# Patient Record
Sex: Female | Born: 1965
Health system: Southern US, Community
[De-identification: ages and names within clinical notes are randomized; demographics above are authoritative.]

## PROBLEM LIST (undated history)

## (undated) DIAGNOSIS — R109 Unspecified abdominal pain: Secondary | ICD-10-CM

## (undated) DIAGNOSIS — J302 Other seasonal allergic rhinitis: Secondary | ICD-10-CM

## (undated) DIAGNOSIS — G10 Huntington's disease: Secondary | ICD-10-CM

## (undated) DIAGNOSIS — E049 Nontoxic goiter, unspecified: Secondary | ICD-10-CM

## (undated) DIAGNOSIS — Z87898 Personal history of other specified conditions: Secondary | ICD-10-CM

## (undated) DIAGNOSIS — R5383 Other fatigue: Secondary | ICD-10-CM

## (undated) DIAGNOSIS — F32A Depression, unspecified: Secondary | ICD-10-CM

## (undated) DIAGNOSIS — O99345 Other mental disorders complicating the puerperium: Secondary | ICD-10-CM

## (undated) DIAGNOSIS — K219 Gastro-esophageal reflux disease without esophagitis: Secondary | ICD-10-CM

## (undated) DIAGNOSIS — R5381 Other malaise: Secondary | ICD-10-CM

## (undated) DIAGNOSIS — R300 Dysuria: Secondary | ICD-10-CM

## (undated) DIAGNOSIS — M5090 Cervical disc disorder, unspecified, unspecified cervical region: Secondary | ICD-10-CM

## (undated) DIAGNOSIS — E559 Vitamin D deficiency, unspecified: Secondary | ICD-10-CM

## (undated) DIAGNOSIS — K589 Irritable bowel syndrome without diarrhea: Secondary | ICD-10-CM

## (undated) DIAGNOSIS — L0390 Cellulitis, unspecified: Secondary | ICD-10-CM

## (undated) DIAGNOSIS — M797 Fibromyalgia: Secondary | ICD-10-CM

## (undated) DIAGNOSIS — F53 Postpartum depression: Secondary | ICD-10-CM

## (undated) DIAGNOSIS — K254 Chronic or unspecified gastric ulcer with hemorrhage: Secondary | ICD-10-CM

## (undated) DIAGNOSIS — R002 Palpitations: Secondary | ICD-10-CM

## (undated) DIAGNOSIS — R42 Dizziness and giddiness: Secondary | ICD-10-CM

## (undated) DIAGNOSIS — L509 Urticaria, unspecified: Secondary | ICD-10-CM

## (undated) DIAGNOSIS — F329 Major depressive disorder, single episode, unspecified: Secondary | ICD-10-CM

## (undated) HISTORY — DX: Chronic or unspecified gastric ulcer with hemorrhage: K25.4

## (undated) HISTORY — DX: Dizziness and giddiness: R42

## (undated) HISTORY — DX: Major depressive disorder, single episode, unspecified: F32.9

## (undated) HISTORY — DX: Dysuria: R30.0

## (undated) HISTORY — DX: Palpitations: R00.2

## (undated) HISTORY — DX: Depression, unspecified: F32.A

## (undated) HISTORY — DX: Urticaria, unspecified: L50.9

## (undated) HISTORY — PX: CARDIAC CATHETERIZATION: SHX172

## (undated) HISTORY — DX: Other malaise: R53.81

## (undated) HISTORY — DX: Other mental disorders complicating the puerperium: O99.345

## (undated) HISTORY — DX: Postpartum depression: F53.0

## (undated) HISTORY — DX: Unspecified abdominal pain: R10.9

## (undated) HISTORY — DX: Personal history of other specified conditions: Z87.898

## (undated) HISTORY — DX: Huntington's disease: G10

## (undated) HISTORY — PX: CHOLECYSTECTOMY: SHX55

## (undated) HISTORY — PX: WISDOM TOOTH EXTRACTION: SHX21

## (undated) HISTORY — DX: Fibromyalgia: M79.7

## (undated) HISTORY — PX: CARTILAGE SURGERY: SHX1303

## (undated) HISTORY — DX: Nontoxic goiter, unspecified: E04.9

## (undated) HISTORY — DX: Other fatigue: R53.83

---

## 1999-02-19 ENCOUNTER — Encounter: Admission: RE | Admit: 1999-02-19 | Discharge: 1999-02-19 | Payer: Self-pay | Admitting: Internal Medicine

## 1999-11-25 ENCOUNTER — Encounter: Admission: RE | Admit: 1999-11-25 | Discharge: 1999-11-25 | Payer: Self-pay | Admitting: Emergency Medicine

## 1999-11-25 ENCOUNTER — Encounter: Payer: Self-pay | Admitting: Emergency Medicine

## 2000-04-15 ENCOUNTER — Emergency Department (HOSPITAL_COMMUNITY): Admission: EM | Admit: 2000-04-15 | Discharge: 2000-04-16 | Payer: Self-pay | Admitting: Emergency Medicine

## 2000-04-16 ENCOUNTER — Encounter: Payer: Self-pay | Admitting: Emergency Medicine

## 2000-09-26 ENCOUNTER — Encounter: Payer: Self-pay | Admitting: Emergency Medicine

## 2000-09-26 ENCOUNTER — Observation Stay (HOSPITAL_COMMUNITY): Admission: EM | Admit: 2000-09-26 | Discharge: 2000-09-28 | Payer: Self-pay | Admitting: Emergency Medicine

## 2000-09-27 ENCOUNTER — Encounter: Payer: Self-pay | Admitting: Cardiovascular Disease

## 2001-03-22 ENCOUNTER — Emergency Department (HOSPITAL_COMMUNITY): Admission: EM | Admit: 2001-03-22 | Discharge: 2001-03-22 | Payer: Self-pay | Admitting: *Deleted

## 2001-06-04 ENCOUNTER — Emergency Department (HOSPITAL_COMMUNITY): Admission: EM | Admit: 2001-06-04 | Discharge: 2001-06-04 | Payer: Self-pay | Admitting: Emergency Medicine

## 2001-06-12 ENCOUNTER — Emergency Department (HOSPITAL_COMMUNITY): Admission: EM | Admit: 2001-06-12 | Discharge: 2001-06-12 | Payer: Self-pay | Admitting: *Deleted

## 2001-06-24 ENCOUNTER — Emergency Department (HOSPITAL_COMMUNITY): Admission: EM | Admit: 2001-06-24 | Discharge: 2001-06-24 | Payer: Self-pay | Admitting: Emergency Medicine

## 2002-01-12 ENCOUNTER — Emergency Department (HOSPITAL_COMMUNITY): Admission: EM | Admit: 2002-01-12 | Discharge: 2002-01-12 | Payer: Self-pay | Admitting: Emergency Medicine

## 2002-04-02 ENCOUNTER — Emergency Department (HOSPITAL_COMMUNITY): Admission: EM | Admit: 2002-04-02 | Discharge: 2002-04-02 | Payer: Self-pay | Admitting: *Deleted

## 2002-09-04 ENCOUNTER — Emergency Department (HOSPITAL_COMMUNITY): Admission: AD | Admit: 2002-09-04 | Discharge: 2002-09-04 | Payer: Self-pay | Admitting: Emergency Medicine

## 2002-09-04 ENCOUNTER — Encounter: Payer: Self-pay | Admitting: Emergency Medicine

## 2002-09-06 ENCOUNTER — Emergency Department (HOSPITAL_COMMUNITY): Admission: EM | Admit: 2002-09-06 | Discharge: 2002-09-06 | Payer: Self-pay

## 2003-10-19 ENCOUNTER — Emergency Department (HOSPITAL_COMMUNITY): Admission: EM | Admit: 2003-10-19 | Discharge: 2003-10-19 | Payer: Self-pay | Admitting: Emergency Medicine

## 2003-10-19 ENCOUNTER — Emergency Department (HOSPITAL_COMMUNITY): Admission: EM | Admit: 2003-10-19 | Discharge: 2003-10-19 | Payer: Self-pay | Admitting: *Deleted

## 2004-05-25 ENCOUNTER — Emergency Department (HOSPITAL_COMMUNITY): Admission: EM | Admit: 2004-05-25 | Discharge: 2004-05-25 | Payer: Self-pay | Admitting: Family Medicine

## 2004-12-12 ENCOUNTER — Emergency Department (HOSPITAL_COMMUNITY): Admission: EM | Admit: 2004-12-12 | Discharge: 2004-12-12 | Payer: Self-pay | Admitting: Emergency Medicine

## 2005-08-20 ENCOUNTER — Emergency Department (HOSPITAL_COMMUNITY): Admission: EM | Admit: 2005-08-20 | Discharge: 2005-08-20 | Payer: Self-pay | Admitting: Emergency Medicine

## 2005-10-06 ENCOUNTER — Inpatient Hospital Stay (HOSPITAL_COMMUNITY): Admission: EM | Admit: 2005-10-06 | Discharge: 2005-10-07 | Payer: Self-pay | Admitting: Emergency Medicine

## 2005-10-11 ENCOUNTER — Encounter: Payer: Self-pay | Admitting: Internal Medicine

## 2005-10-11 ENCOUNTER — Ambulatory Visit: Payer: Self-pay

## 2005-10-13 ENCOUNTER — Emergency Department (HOSPITAL_COMMUNITY): Admission: EM | Admit: 2005-10-13 | Discharge: 2005-10-13 | Payer: Self-pay | Admitting: Emergency Medicine

## 2005-10-19 ENCOUNTER — Ambulatory Visit: Payer: Self-pay | Admitting: Internal Medicine

## 2005-10-25 ENCOUNTER — Ambulatory Visit (HOSPITAL_COMMUNITY): Admission: RE | Admit: 2005-10-25 | Discharge: 2005-10-25 | Payer: Self-pay | Admitting: Internal Medicine

## 2005-10-28 ENCOUNTER — Ambulatory Visit (HOSPITAL_COMMUNITY): Admission: RE | Admit: 2005-10-28 | Discharge: 2005-10-28 | Payer: Self-pay | Admitting: Cardiovascular Disease

## 2005-11-17 ENCOUNTER — Emergency Department (HOSPITAL_COMMUNITY): Admission: EM | Admit: 2005-11-17 | Discharge: 2005-11-17 | Payer: Self-pay | Admitting: Emergency Medicine

## 2005-11-17 ENCOUNTER — Ambulatory Visit: Payer: Self-pay | Admitting: Cardiology

## 2005-11-19 ENCOUNTER — Ambulatory Visit: Payer: Self-pay | Admitting: Cardiology

## 2005-12-07 ENCOUNTER — Ambulatory Visit: Payer: Self-pay | Admitting: Internal Medicine

## 2006-04-01 ENCOUNTER — Ambulatory Visit (HOSPITAL_COMMUNITY): Admission: RE | Admit: 2006-04-01 | Discharge: 2006-04-01 | Payer: Self-pay | Admitting: Family Medicine

## 2006-10-06 ENCOUNTER — Inpatient Hospital Stay (HOSPITAL_COMMUNITY): Admission: AD | Admit: 2006-10-06 | Discharge: 2006-10-06 | Payer: Self-pay | Admitting: Obstetrics & Gynecology

## 2006-11-17 ENCOUNTER — Inpatient Hospital Stay (HOSPITAL_COMMUNITY): Admission: RE | Admit: 2006-11-17 | Discharge: 2006-11-20 | Payer: Self-pay | Admitting: Obstetrics and Gynecology

## 2006-11-17 ENCOUNTER — Encounter (INDEPENDENT_AMBULATORY_CARE_PROVIDER_SITE_OTHER): Payer: Self-pay | Admitting: Obstetrics and Gynecology

## 2006-12-11 ENCOUNTER — Emergency Department (HOSPITAL_COMMUNITY): Admission: EM | Admit: 2006-12-11 | Discharge: 2006-12-11 | Payer: Self-pay | Admitting: Emergency Medicine

## 2007-02-18 ENCOUNTER — Emergency Department (HOSPITAL_COMMUNITY): Admission: EM | Admit: 2007-02-18 | Discharge: 2007-02-18 | Payer: Self-pay | Admitting: Emergency Medicine

## 2007-03-11 ENCOUNTER — Emergency Department (HOSPITAL_COMMUNITY): Admission: EM | Admit: 2007-03-11 | Discharge: 2007-03-11 | Payer: Self-pay | Admitting: Emergency Medicine

## 2007-05-03 ENCOUNTER — Emergency Department (HOSPITAL_COMMUNITY): Admission: EM | Admit: 2007-05-03 | Discharge: 2007-05-03 | Payer: Self-pay | Admitting: Emergency Medicine

## 2007-06-02 ENCOUNTER — Emergency Department (HOSPITAL_COMMUNITY): Admission: EM | Admit: 2007-06-02 | Discharge: 2007-06-02 | Payer: Self-pay | Admitting: Emergency Medicine

## 2007-06-02 ENCOUNTER — Ambulatory Visit: Payer: Self-pay | Admitting: Cardiovascular Disease

## 2007-06-05 ENCOUNTER — Encounter: Payer: Self-pay | Admitting: Internal Medicine

## 2007-06-05 ENCOUNTER — Ambulatory Visit: Payer: Self-pay

## 2007-06-09 ENCOUNTER — Ambulatory Visit: Payer: Self-pay | Admitting: Internal Medicine

## 2007-06-26 LAB — CONVERTED CEMR LAB: Pap Smear: NORMAL

## 2007-07-20 ENCOUNTER — Emergency Department (HOSPITAL_COMMUNITY): Admission: EM | Admit: 2007-07-20 | Discharge: 2007-07-20 | Payer: Self-pay | Admitting: Family Medicine

## 2007-07-25 ENCOUNTER — Ambulatory Visit: Payer: Self-pay | Admitting: Internal Medicine

## 2007-07-25 DIAGNOSIS — E049 Nontoxic goiter, unspecified: Secondary | ICD-10-CM | POA: Insufficient documentation

## 2007-07-25 DIAGNOSIS — J329 Chronic sinusitis, unspecified: Secondary | ICD-10-CM | POA: Insufficient documentation

## 2007-07-25 DIAGNOSIS — R002 Palpitations: Secondary | ICD-10-CM | POA: Insufficient documentation

## 2007-07-25 DIAGNOSIS — Z87898 Personal history of other specified conditions: Secondary | ICD-10-CM | POA: Insufficient documentation

## 2007-07-25 DIAGNOSIS — K219 Gastro-esophageal reflux disease without esophagitis: Secondary | ICD-10-CM | POA: Insufficient documentation

## 2007-07-31 ENCOUNTER — Encounter: Admission: RE | Admit: 2007-07-31 | Discharge: 2007-07-31 | Payer: Self-pay | Admitting: Internal Medicine

## 2007-08-01 ENCOUNTER — Telehealth (INDEPENDENT_AMBULATORY_CARE_PROVIDER_SITE_OTHER): Payer: Self-pay | Admitting: *Deleted

## 2007-08-03 ENCOUNTER — Ambulatory Visit: Payer: Self-pay | Admitting: Internal Medicine

## 2007-08-03 DIAGNOSIS — L509 Urticaria, unspecified: Secondary | ICD-10-CM | POA: Insufficient documentation

## 2007-08-17 ENCOUNTER — Ambulatory Visit: Payer: Self-pay | Admitting: Internal Medicine

## 2007-08-17 DIAGNOSIS — J309 Allergic rhinitis, unspecified: Secondary | ICD-10-CM | POA: Insufficient documentation

## 2007-10-19 ENCOUNTER — Ambulatory Visit: Payer: Self-pay | Admitting: Internal Medicine

## 2007-10-19 ENCOUNTER — Telehealth: Payer: Self-pay | Admitting: Internal Medicine

## 2007-10-19 DIAGNOSIS — R300 Dysuria: Secondary | ICD-10-CM | POA: Insufficient documentation

## 2007-10-19 DIAGNOSIS — R5383 Other fatigue: Secondary | ICD-10-CM | POA: Insufficient documentation

## 2007-10-19 LAB — CONVERTED CEMR LAB
Bilirubin Urine: NEGATIVE
Crystals: NEGATIVE
Ketones, ur: NEGATIVE mg/dL
Mucus, UA: NEGATIVE
Nitrite: NEGATIVE
Specific Gravity, Urine: <=1.005 (ref 1.000–1.03)
Total Protein, Urine: NEGATIVE mg/dL
Urine Glucose: NEGATIVE mg/dL
Urobilinogen, UA: 0.2 (ref 0.0–1.0)
pH: 5.5 (ref 5.0–8.0)

## 2007-10-20 ENCOUNTER — Telehealth: Payer: Self-pay | Admitting: Internal Medicine

## 2007-10-26 ENCOUNTER — Encounter: Admission: RE | Admit: 2007-10-26 | Discharge: 2007-10-26 | Payer: Self-pay | Admitting: Internal Medicine

## 2007-10-26 ENCOUNTER — Ambulatory Visit: Payer: Self-pay | Admitting: Internal Medicine

## 2007-10-26 ENCOUNTER — Telehealth: Payer: Self-pay | Admitting: Internal Medicine

## 2007-10-26 ENCOUNTER — Telehealth (INDEPENDENT_AMBULATORY_CARE_PROVIDER_SITE_OTHER): Payer: Self-pay | Admitting: *Deleted

## 2007-10-26 DIAGNOSIS — R109 Unspecified abdominal pain: Secondary | ICD-10-CM | POA: Insufficient documentation

## 2007-10-26 LAB — CONVERTED CEMR LAB
BUN: 8 mg/dL (ref 6–23)
Bacteria, UA: NEGATIVE
Basophils Absolute: 0 10*3/uL (ref 0.0–0.1)
Basophils Relative: 0.6 % (ref 0.0–1.0)
Bilirubin Urine: NEGATIVE
CO2: 25 meq/L (ref 19–32)
Calcium: 9 mg/dL (ref 8.4–10.5)
Chloride: 106 meq/L (ref 96–112)
Creatinine, Ser: 0.9 mg/dL (ref 0.4–1.2)
Crystals: NEGATIVE
Eosinophils Absolute: 0.1 10*3/uL (ref 0.0–0.7)
Eosinophils Relative: 1.7 % (ref 0.0–5.0)
GFR calc Af Amer: 89 mL/min
GFR calc non Af Amer: 74 mL/min
Glucose, Bld: 96 mg/dL (ref 70–99)
HCT: 39.4 % (ref 36.0–46.0)
Hemoglobin, Urine: NEGATIVE
Hemoglobin: 13.8 g/dL (ref 12.0–15.0)
Ketones, ur: NEGATIVE mg/dL
Leukocytes, UA: NEGATIVE
Lymphocytes Relative: 43.4 % (ref 12.0–46.0)
MCHC: 35.1 g/dL (ref 30.0–36.0)
MCV: 89.3 fL (ref 78.0–100.0)
Monocytes Absolute: 0.6 10*3/uL (ref 0.1–1.0)
Monocytes Relative: 10.2 % (ref 3.0–12.0)
Mucus, UA: NEGATIVE
Neutro Abs: 2.4 10*3/uL (ref 1.4–7.7)
Neutrophils Relative %: 44.1 % (ref 43.0–77.0)
Nitrite: NEGATIVE
Platelets: 323 10*3/uL (ref 150–400)
Potassium: 4.5 meq/L (ref 3.5–5.1)
RBC / HPF: NONE SEEN
RBC: 4.41 M/uL (ref 3.87–5.11)
RDW: 12 % (ref 11.5–14.6)
Sed Rate: 20 mm/hr (ref 0–22)
Sodium: 141 meq/L (ref 135–145)
Specific Gravity, Urine: <=1.005 (ref 1.000–1.03)
Total Protein, Urine: NEGATIVE mg/dL
Urine Glucose: NEGATIVE mg/dL
Urobilinogen, UA: 0.2 (ref 0.0–1.0)
WBC, UA: NONE SEEN cells/hpf
WBC: 5.4 10*3/uL (ref 4.5–10.5)
pH: 5.5 (ref 5.0–8.0)

## 2007-10-31 ENCOUNTER — Emergency Department (HOSPITAL_COMMUNITY): Admission: EM | Admit: 2007-10-31 | Discharge: 2007-10-31 | Payer: Self-pay | Admitting: Emergency Medicine

## 2007-10-31 ENCOUNTER — Telehealth (INDEPENDENT_AMBULATORY_CARE_PROVIDER_SITE_OTHER): Payer: Self-pay | Admitting: *Deleted

## 2007-11-01 ENCOUNTER — Encounter: Payer: Self-pay | Admitting: Internal Medicine

## 2007-11-06 ENCOUNTER — Telehealth: Payer: Self-pay | Admitting: Internal Medicine

## 2007-11-07 ENCOUNTER — Emergency Department (HOSPITAL_COMMUNITY): Admission: EM | Admit: 2007-11-07 | Discharge: 2007-11-07 | Payer: Self-pay | Admitting: Emergency Medicine

## 2007-12-29 ENCOUNTER — Emergency Department (HOSPITAL_COMMUNITY): Admission: EM | Admit: 2007-12-29 | Discharge: 2007-12-29 | Payer: Self-pay | Admitting: Family Medicine

## 2007-12-30 ENCOUNTER — Telehealth: Payer: Self-pay | Admitting: Family Medicine

## 2008-03-16 ENCOUNTER — Emergency Department (HOSPITAL_COMMUNITY): Admission: EM | Admit: 2008-03-16 | Discharge: 2008-03-16 | Payer: Self-pay | Admitting: Emergency Medicine

## 2008-03-17 ENCOUNTER — Encounter: Payer: Self-pay | Admitting: Physician Assistant

## 2008-03-18 ENCOUNTER — Ambulatory Visit: Payer: Self-pay | Admitting: Internal Medicine

## 2008-03-18 LAB — CONVERTED CEMR LAB
Free T4: 0.8 ng/dL (ref 0.6–1.6)
Magnesium: 2.2 mg/dL (ref 1.5–2.5)
TSH: 1.5 microintl units/mL (ref 0.35–5.50)

## 2008-03-19 ENCOUNTER — Telehealth (INDEPENDENT_AMBULATORY_CARE_PROVIDER_SITE_OTHER): Payer: Self-pay | Admitting: *Deleted

## 2008-03-27 ENCOUNTER — Ambulatory Visit: Payer: Self-pay | Admitting: Cardiology

## 2008-11-25 ENCOUNTER — Emergency Department (HOSPITAL_COMMUNITY): Admission: EM | Admit: 2008-11-25 | Discharge: 2008-11-25 | Payer: Self-pay | Admitting: Emergency Medicine

## 2009-04-18 ENCOUNTER — Telehealth (INDEPENDENT_AMBULATORY_CARE_PROVIDER_SITE_OTHER): Payer: Self-pay | Admitting: *Deleted

## 2009-07-17 ENCOUNTER — Emergency Department (HOSPITAL_COMMUNITY): Admission: EM | Admit: 2009-07-17 | Discharge: 2009-07-17 | Payer: Self-pay | Admitting: Emergency Medicine

## 2009-09-25 ENCOUNTER — Emergency Department (HOSPITAL_COMMUNITY): Admission: EM | Admit: 2009-09-25 | Discharge: 2009-09-26 | Payer: Self-pay | Admitting: Emergency Medicine

## 2010-03-23 ENCOUNTER — Emergency Department (HOSPITAL_COMMUNITY): Admission: EM | Admit: 2010-03-23 | Discharge: 2010-03-23 | Payer: Self-pay | Admitting: Family Medicine

## 2010-04-02 ENCOUNTER — Emergency Department (HOSPITAL_COMMUNITY): Admission: EM | Admit: 2010-04-02 | Discharge: 2010-04-02 | Payer: Self-pay | Admitting: Family Medicine

## 2010-04-06 ENCOUNTER — Emergency Department (HOSPITAL_COMMUNITY)
Admission: EM | Admit: 2010-04-06 | Discharge: 2010-04-06 | Payer: Self-pay | Source: Home / Self Care | Admitting: Emergency Medicine

## 2010-05-31 ENCOUNTER — Encounter: Payer: Self-pay | Admitting: Emergency Medicine

## 2010-06-11 NOTE — Assessment & Plan Note (Signed)
Summary: per ph note-back pain-$50-stc   Vital Signs:  Patient Profile:   45 Years Old Female Height:     70 inches Weight:      191.25 pounds BMI:     27.54 Temp:     97.9 degrees F oral Pulse rate:   65 / minute BP sitting:   110 / 71  (right arm)  Pt. in pain?   yes    Location:   lower back    Intensity:   8    Type:       dull  Vitals Entered By: Glendell Docker (October 26, 2007 1:26 PM)                  Chief Complaint:  Back Pain.  History of Present Illness: A 45 year old African-American female recently seen for urinary tract infection.  Patient complains of increase in lower swelling back pain 8/10 since last visit.  her symptoms are predominantly right-sided.  She describes a dull ache with occasional severe sharp sensation.  She has finished her antibiotics.  She denies dysuria.  She denies fever chills.  Right-sided back pain radiates to her right groin and right leg.  She denies lower extremity weakness.    Current Allergies (reviewed today): ! 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       Never Smoked       Alcohol use-no    Review of Systems      See HPI   Physical Exam  General:     alert, well-developed, and well-nourished.   Head:     normocephalic and atraumatic.   Lungs:     normal respiratory effort and normal breath sounds.   Heart:     normal rate, regular rhythm, and no gallop.     Abdomen:     positive suprapubic tenderness.soft, no guarding, no rigidity, and no rebound tenderness.   Msk:     minimal decrease in lumbar range of motion.  Muscle strength is 5 out of 5.  DTRs symmetrical and normal. Extremities:     No lower extremity edema  Neurologic:     alert & oriented X3 and cranial nerves II-XII intact.   Skin:     turgor normal and color normal.   Psych:     tearful, normally interactive.      Impression & Recommendations:  Problem # 1:  FLANK PAIN, RIGHT (ICD-789.09) Patient with intermittent right-sided back/flank pain.  Consider pyelonephritis.  Patient may also have kidney stone.  Obtain CBC, sed rate, UA.  Arrange CT of abdomen and pelvis.  Percocet for pain.  Patient advised to increase fluid intake.  Her updated medication list for this problem includes:    Percocet 7.5-325 Mg Tabs (  Oxycodone-acetaminophen) ..... One by mouth q12 hrs as needed  Orders: TLB-CBC Platelet - w/Differential (85025-CBCD) TLB-Udip w/ Micro (81001-URINE) TLB-Sedimentation Rate (ESR) (85651-ESR) TLB-BMP (Basic Metabolic Panel-BMET) (80048-METABOL) Radiology Referral (Radiology)  The following medications were removed from the medication list:    Vicodin 5-500 Mg Tabs (Hydrocodone-acetaminophen) .[name]... 1 q 12 hrs as needed pain  Her updated medication list for this problem includes:    Percocet 7.5-325 Mg Tabs (Oxycodone-acetaminophen) ..... One by mouth q12 hrs as needed   Complete Medication List: 1)  Toprol Xl 25 Mg Tb24 (Metoprolol succinate) .... Take 1 tablet by mouth two times a day 2)  Protonix 40 Mg Tbec (Pantoprazole sodium) .... Take 1 tablet by mouth once a day 3)  Multivitamins Tabs (Multiple vitamin) .... Take 1 tablet by mouth once a day 4)  Flonase 50 Mcg/act Susp (Fluticasone propionate) .... 2 sprays each nostril  once daily 5)  Caltrate 600 1500 Mg Tabs (Calcium carbonate) .... Take 1 tablet by mouth once a day 6)  Metronidazole 500 Mg Tabs  (Metronidazole) .[name].. 1 three times a day x 7 days 7)  Percocet 7.5-325 Mg Tabs (Oxycodone-acetaminophen) .... One by mouth q12 hrs as needed   Patient Instructions: 1)  Call office or seek emergency medical care if your pain worsens or you develop fever/chills.  Follow-up office within one week.   Prescriptions: PERCOCET 7.5-325 MG  TABS (OXYCODONE-ACETAMINOPHEN) one by mouth q12 hrs as needed  #30 x 0   Entered and Authorized by:   D. Thomos Lemons DO   Signed by:   D. Thomos Lemons DO on 10/26/2007   Method used:   Print then Give to Patient   RxID:   [id number]  ] Current Allergies (reviewed today): ! PSEUDOEPHEDRINE HCL (PSEUDOEPHEDRINE HCL) PENICILLIN

## 2010-06-11 NOTE — Miscellaneous (Signed)
Summary: Cipro  Clinical Lists Changes  Medications: Changed medication from CIPROFLOXACIN HCL 500 MG  TABS (CIPROFLOXACIN HCL) Take 1 tablet by mouth two times a day to CIPROFLOXACIN HCL 500 MG  TABS (CIPROFLOXACIN HCL) Take 1 tablet by mouth two times a day - Signed Rx of CIPROFLOXACIN HCL 500 MG  TABS (CIPROFLOXACIN HCL) Take 1 tablet by mouth two times a day;  #14 x 0;  Signed;  Entered by: Glendell Docker;  Authorized by: D. Thomos Lemons DO;  Method used: Electronic    Prescriptions: CIPROFLOXACIN HCL 500 MG  TABS (CIPROFLOXACIN HCL) Take 1 tablet by mouth two times a day  #14 x 0   Entered by:   Glendell Docker   Authorized by:   D. Thomos Lemons DO   Signed by:   Glendell Docker on 11/01/2007   Method used:   Electron[facility] to ...       [facility] [address] [phone][facility]       [address]       [place], [place]  [address]       Ph: [phone] or [phone]       Fax: [phone]   RxID:   [id number]   Patient states she is still having unresolved urinary symptoms and request to repeat cipro. Rx sent to pharmacy. She also states she is still having unresolved back pain and was seen at [facility] and was diagnosed with sciatica. She was placed out of work until Monday and advised to have a MRI if there is no improvement.

## 2010-06-11 NOTE — Assessment & Plan Note (Signed)
Summary: Was in E.R. for heart palpatations   Vital Signs:  Patient Profile:   45 Years Old Female Height:     70 inches Weight:      178.50 pounds Temp:     98.7 degrees F oral Pulse rate:   72 / minute Pulse rhythm:   regular Resp:     16 per minute BP sitting:   106 / 66  (right arm) Cuff size:   regular  Vitals Entered By: Windell Norfolk (March 18, 2008 2:33 PM)                 PCP:  Dondra Spry DO  Chief Complaint:  medication adjustment and Palpitations.  History of Present Illness:  Palpitations      This is a 45 year old woman who presents with Palpitations.  The patient denies dizziness and presyncope.  The patient denies the following symptoms: shortness of breath.  The palpitations are described as a sensation of rapid heart fluttering.  The palpitations are intermittent.  Evaluation to date has included stress test and echocardiogram 05/2007.  Patient has done fairly well up to this point on Toprol-XL 50 mg once daily.  Patient reports occasional dizziness.  Her heart rate can vary from 60 to 99.  She reports recent diet where she has been only consuming foods and fruit juices.    Updated Prior Medication List: TOPROL XL 25 MG  TB24 (METOPROLOL SUCCINATE) Take 1 tablet by mouth two times a day PROTONIX 40 MG  TBEC (PANTOPRAZOLE SODIUM) Take 1 tablet by mouth once a day MULTIVITAMINS   TABS (MULTIPLE VITAMIN) Take 1 tablet by mouth once a day CALTRATE 600 1500 MG  TABS (CALCIUM CARBONATE) Take 1 tablet by mouth once a day  Current Allergies (reviewed today): ! 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    Never Smoked    Alcohol use-no     Review of Systems  The patient denies chest pain and dyspnea on exertion.     Physical Exam  General:     alert, well-developed, and well-nourished.   Head:     normocephalic and atraumatic.   Neck:     supple, no masses, no thyromegaly, and no thyroid nodules or tenderness.   Lungs:     normal respiratory effort and normal breath sounds.   Heart:     normal rate, regular rhythm, no murmur, and no gallop.   Extremities:     No lower extremity edema  Neurologic:     cranial nerves II-XII intact and gait normal.   Psych:     normally interactive, good eye contact, and slightly anxious.      Impression & Recommendations:  Problem # 1:  PALPITATIONS (ICD-80.30) 45 year old African-American female with intimate palpitations.  She has experienced this in the past.  Her previous  cardiac workup performed in January of 2009 was completely unremarkable.  I suspect her recent diet of fruits and fruit juices is responsible for her recent flare.  EKG today showed normal sinus rhythm at 58 beats/min.  She is experiencing fatigue.  I advised patient decrease her Toprol to 25 mg once daily.  Patient advised to resume previous normal diet. Her updated medication list for this problem includes:    Toprol Xl 25 Mg Tb24 (Metoprolol succinate) .[name]... Take 1 tablet by mouth once daily  Orders: TLB-TSH (Thyroid Stimulating Hormone) (84443-TSH) TLB-T4 (Thyrox), Free 814 035 1445) TLB-Magnesium (Mg) (83735-MG)   Complete Medication List: 1)  Toprol Xl 25 Mg Tb24 (Metoprolol succinate) .... Take 1 tablet by mouth once daily 2)  Protonix 40 Mg Tbec (Pantoprazole sodium) .... Take 1 tablet by mouth once a day 3)  Multivitamins Tabs (Multiple vitamin) .... Take 1 tablet by mouth once a day 4)   Caltrate 600 1500 Mg Tabs (Calcium carbonate) .... Take 1 tablet by mouth once a day   Patient Instructions: 1)  Please schedule a follow-up appointment in 2 months.   Prescriptions: TOPROL XL 25 MG  TB24 (METOPROLOL SUCCINATE) Take 1 tablet by mouth once daily  #30 x 2   Entered and Authorized by:   D. Thomos Lemons DO   Signed by:   D. Thomos Lemons DO on 03/18/2008   Method used:   [facility] to        [facility] [address] [phone]* (retail)       [address]       [place], [place]  [address]       Ph: [phone] or [phone]       Fax: [phone]   RxID:   [id number]  ]

## 2010-06-11 NOTE — Progress Notes (Signed)
Summary: Itching  Phone Note Call from Patient   Summary of Call: Pt went to [facility] approx 1 1/2 wks ago for sinus infection. Pt was given ammoxicillin and allegra. Pt says that several days ago she became itchy. It started on her scalp and spread to her face. She says that her face is red and whelped in the areas that she scratches. She also has itching on her arms and legs. Initial call taken by: Lamar Sprinkles,  August 01, 2007 4:39 PM  Follow-up for Phone Call        Per Dr Artist Pais pt needs office visit, left mess to call office back Dr Artist Pais said ok to work in Wednesday..................................................................Marland KitchenLamar Sprinkles  August 01, 2007 4:42 PM   Additional Follow-up for Phone Call Additional follow up Details #1::        PT HAS AN APPT ON MARCH 26 AT 2:00.  SHE IS AWARE. Additional Follow-up by: Hilarie Fredrickson,  August 02, 2007 3:28 PM

## 2010-06-11 NOTE — Assessment & Plan Note (Signed)
Summary: NEW PT -[facility] -NON MEDICARE-$50-NEW PKG-STC   Vital Signs:  Patient Profile:   45 Years Old Female Height:     70 inches Weight:      188.38 pounds BMI:     27.13 Temp:     98.2 degrees F oral Pulse rate:   88 / minute BP sitting:   94 / 60  (right arm)  Vitals Entered By: Glendell Docker (July 25, 2007 9:23 AM)                 Chief Complaint:  NEW PATIENT .  History of Present Illness: 45 year old AA female with PMHx of atypical chest pain and palpitaitions here to establish primary care.  I reviewed her medical records.    Current Allergies (reviewed today): ! 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    Never Smoked    Alcohol use-no   Risk Factors:  Tobacco use:  never Alcohol use:  no   Review of Systems  The patient denies fever, weight loss, weight gain, dyspnea on exhertion, abdominal pain, and severe indigestion/heartburn.         She reports mild dysphagia with pieces of bread or steak.   Physical Exam  General:     alert, well-developed, well-nourished, and well-hydrated.   Head:     normocephalic and atraumatic.   Eyes:     vision grossly intact, pupils equal, pupils round, and pupils reactive to light.   Ears:     R ear normal and L ear normal.   Nose:     External nasal examination shows no deformity or inflammation. Nasal mucosa are pink and moist without lesions or exudates. Mouth:     Oral mucosa and oropharynx without lesions or exudates.  Teeth  in good repair. Neck:     supple.  right thyroid fullness Lungs:     normal respiratory effort, normal breath sounds, and no wheezes.   Heart:     normal rate, regular rhythm, no murmur, and no gallop.   Abdomen:     soft, non-tender, no hepatomegaly, and no splenomegaly.   Extremities:     No lower extremity edema  Neurologic:     No cranial nerve deficits noted. Station and gait are normal.  Sensory, motor and coordinative functions appear intact. Psych:     normally interactive, good eye contact, not anxious appearing, and not depressed appearing.      Impression & Recommendations:  Problem # 1:  HEALTH MAINTENANCE EXAM (ICD-V70.0) Reviewed adult health maintenance protocols.  She is up to date with mammo and PAP/Pelvic.  Her last Tetanus 2005.    Problem # 2:  PALPITATIONS (ICD-785.1) Pt with history of palpitaitons and atypical chest pain.   Her symptoms have significantly improved with toprol.  Her updated medication list for this problem includes:    Toprol Xl 25 Mg Tb24 (  Metoprolol succinate) .[name]... Take 1 tablet by mouth two times a day   Problem # 3:  GOITER (ICD-240.9) Pt has right sided thyroid fullness.  Obtain thyroid u/s. Orders: Radiology Referral (Radiology)   Problem # 4:  GERD (ICD-530.81) Reflux symptoms resolved with PPI.  She rarely has dysphagia.  Monitor for now.  If persistent symptoms or wt loss, consider GI referral for EGD.  Her updated medication list for this problem includes:    Protonix 40 Mg Tbec (Pantoprazole sodium) .[name]... Take 1 tablet by mouth once a day   Complete Medication List: 1)  Toprol Xl 25 Mg Tb24 (Metoprolol succinate) .... Take 1 tablet by mouth two times a day 2)  Protonix 40 Mg Tbec (Pantoprazole sodium) .... Take 1 tablet by mouth once a day 3)  Allegra 180 Mg Tabs (Fexofenadine hcl) .... Take 1 tablet by mouth once a day 4)  Multivitamins Tabs (Multiple vitamin) .... Take 1 tablet by mouth once a day   Patient  Instructions: 1)  Please schedule a follow-up appointment in 6 months. 2)  Lipid Panel prior to visit, ICD-9: v70 3)  TSH prior to visit, ICD-9:  785.1 4)  Free T4:  785.1 5)  Thyroid ultrasound to be scheduled.    ] Current Allergies (reviewed today): ! PSEUDOEPHEDRINE HCL (PSEUDOEPHEDRINE HCL)

## 2010-06-11 NOTE — Assessment & Plan Note (Signed)
Summary: 2 WK ROV/NWS  $50/NWS   Vital Signs:  Patient Profile:   45 Years Old Female Height:     70 inches Weight:      188.38 pounds BMI:     27.13 Temp:     99 degrees F oral Pulse rate:   83 / minute BP sitting:   107 / 74  (right arm)  Vitals Entered By: Glendell Docker (August 17, 2007 2:40 PM)                 Chief Complaint:  FOLLOW C/O UNRESOLVED SINUS CONGESTION and URI symptoms.  History of Present Illness:  URI Symptoms      This is a 45 year old woman who presents with URI symptoms.  The patient reports nasal congestion and sore throat, but denies purulent nasal discharge and productive cough.  The patient denies fever.  The patient also reports seasonal symptoms and headache.  The patient denies the following risk factors for Strep sinusitis: unilateral facial pain and unilateral nasal discharge.  She reports her sinuses felt better when she was on prednisone and xyzal.    Current Allergies (reviewed today): ! PSEUDOEPHEDRINE HCL (PSEUDOEPHEDRINE HCL) PENICILLIN  Past Medical History:    Reviewed history from 07/25/2007 and no changes required:       Hypertension       History of atypical chest pain  (negative cardiac CT)       History of palpitations       GERD       History of headaches  Past Surgical History:    Reviewed history from 07/25/2007 and no changes required:       Caesarean section x 2 - 1988 and 2008   Family History:    Reviewed history from 07/25/2007 and no changes required:       Mother is age 44 - healthy       Father deceased age 66 - diabetes, CAD       Brother with type II diabetes (She has 6 siblings)  Social History:    Reviewed history from 07/25/2007 and no changes required:       Occupation: Psychologist, 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       Never Smoked       Alcohol use-no     Physical Exam  General:     alert, well-developed, and well-nourished.   Head:     normocephalic  and atraumatic.   Eyes:     vision grossly intact, pupils equal, pupils round, and pupils reactive to light.   Ears:     R ear normal and L ear normal.   Nose:     nasal dischargemucosal pallor and mucosal erythema.   Mouth:     no erythema, no exudates, and postnasal drip.   Neck:     supple and no masses.   Lungs:     normal respiratory effort and normal breath sounds.   Heart:     normal rate, regular rhythm, no murmur, and no gallop.      Impression & Recommendations:  Problem # 1:  ALLERGIC RHINITIS (ICD-477.9) Pt with nasal congestion and sinus headache.  I advised nasal saline irrigation.  Sample of veramyst and astelin nasal spray provided.  I also advised pt use Zyrtec OTC.  Her updated medication list for this problem includes:    Flonase 50 Mcg/act Susp (Fluticasone propionate) .Marland KitchenMarland KitchenMarland KitchenMarland Kitchen 2  sprays each nostril  once daily   Problem # 2:  URTICARIA (ICD-708.9) Assessment: Improved Resolved with prednisone and xyzal.  Complete Medication List: 1)  Toprol Xl 25 Mg Tb24 (Metoprolol succinate) .... Take 1 tablet by mouth two times a day 2)  Protonix 40 Mg Tbec (Pantoprazole sodium) .... Take 1 tablet by mouth once a day 3)  Multivitamins Tabs (Multiple vitamin) .... Take 1 tablet by mouth once a day 4)  Flonase 50 Mcg/act Susp (Fluticasone propionate) .... 2 sprays each nostril  once daily     Prescriptions: FLONASE 50 MCG/ACT  SUSP (FLUTICASONE PROPIONATE) 2 sprays each nostril  once daily  #1 bottle x 3   Entered and Authorized by:   D. Thomos Lemons DO   Signed by:   D. Thomos Lemons DO on 08/17/2007   Method used:   Electronically sent to ...       [facility] [address] [phone]*       [address]       [place], [place]  [address]       Ph: [phone] or [phone]       Fax: [phone]   RxID:   [id number]  ] Current Allergies (reviewed today): ! PSEUDOEPHEDRINE HCL (PSEUDOEPHEDRINE HCL) PENICILLIN

## 2010-06-11 NOTE — Assessment & Plan Note (Signed)
Summary: LOWER BACK PAIN-URINE FREQUENCY-$50-STC   Vital Signs:  Patient Profile:   45 Years Old Female Height:     70 inches Temp:     98.9 degrees F oral Pulse rate:   65 / minute BP sitting:   111 / 76  (right arm)  Vitals Entered By: Glendell Docker (October 19, 2007 3:48 PM)                 Chief Complaint:  C/O IRNARY FREQUENCY AND URGENCY   and Dysuria.  History of Present Illness: c/o urinary frequency and urgency for the past week. Patient attempted to increase fluids,with no improvement. Has had some dysuria, but says that has resolved  Dysuria      This is a 45 year old woman who presents with Dysuria.  The patient reports burning with urination and urinary frequency.  Associated symptoms include back pain.  The patient denies the following associated symptoms: fever, shaking chills, and flank pain.    Patient also complains of chronic fatigue.  This has been ongoing for several months.  She notes increased stress at home and at work.  She also mentions poor sleep quality.    Current Allergies (reviewed today): ! 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       Never Smoked       Alcohol use-no   Risk Factors:  Mammogram History:     Date of Last Mammogram:  06/26/2007    Results:  normal   PAP Smear History:     Date of Last PAP Smear:  06/26/2007    Results:  normal    Review of Systems      See  HPI   Physical Exam  General:     alert, well-developed, and well-nourished. nontoxic Lungs:     normal respiratory effort and normal breath sounds.   Heart:     normal rate, regular rhythm, no murmur, and no gallop.   Abdomen:     soft.  suprapubic tenderness.  No flank tenderness    Impression & Recommendations:  Problem # 1:  DYSURIA (ICD-788.1) Patient with cystitis.  Cipro x 10 days.  Patient advised to call office if she develops fever or flank pain.  Her updated medication list for this problem includes:    Cipro 500 Mg Tabs (Ciprofloxacin hcl) ..... One by mouth bid  Orders: TLB-Udip w/ Micro (81001-URINE)   Problem # 2:  FATIGUE (ICD-780.79) Rule out metabolic derangement.  I suspect her fatigue is secondary to life stressors.  Complete Medication List: 1)  Toprol Xl 25 Mg Tb24 (Metoprolol succinate) .... Take 1 tablet by mouth two times a day 2)  Protonix 40 Mg Tbec (Pantoprazole sodium) .... Take 1 tablet by mouth  once a day 3)  Multivitamins Tabs (Multiple vitamin) .... Take 1 tablet by mouth once a day 4)  Flonase 50 Mcg/act Susp (Fluticasone propionate) .... 2 sprays each nostril  once daily 5)  Caltrate 600 1500 Mg Tabs (Calcium carbonate) .... Take 1 tablet by mouth once a day 6)  Cipro 500 Mg Tabs (Ciprofloxacin hcl) .... One by mouth bid   Patient Instructions: 1)  Please schedule a follow-up appointment in 2 months. 2)  BMP prior to visit, ICD-9:  780.79 3)  Hepatic Panel prior to visit, ICD-9:  780.79 4)  Lipid Panel prior to visit, ICD-9:  V70 5)  TSH prior to visit, ICD-9: 780.79 6)  CBC w/ Diff prior to visit, ICD-9:  780.79 7)  Please return for lab work within 1 week.   Prescriptions: CIPRO 500 MG  TABS (CIPROFLOXACIN HCL) one by mouth bid  #14 x 0   Entered and Authorized by:   D. Thomos Lemons DO   Signed by:   D. Thomos Lemons DO on 10/19/2007   Method used:   Electronically sent to ...       [facility] [address] [phone]*       [address]       [place], [place]  [address]       Ph: [phone] or [phone]       Fax: [phone]   RxID:   [id number]  ] Current Allergies (reviewed today): ! PSEUDOEPHEDRINE HCL (PSEUDOEPHEDRINE HCL) PENICILLIN   Preventive Care Screening  Mammogram:    Date:  06/26/2007    Results:  normal   Pap Smear:    Date:  06/26/2007    Results:  normal

## 2010-06-11 NOTE — Progress Notes (Signed)
Summary: Urine Results  Phone Note Outgoing Call Call back at [facility] Phone [phone]   Summary of Call: Call patient- urinalysis positive for infection.  Please ask patient if she is having any vaginal discharge or vaginal symptoms. Initial call taken by: D. Thomos Lemons DO,  October 19, 2007 5:49 PM  Follow-up for Phone Call        Left voice message at [phone] to call Follow-up by: Glendell Docker,  October 20, 2007 9:52 AM  Additional Follow-up for Phone Call Additional follow up Details #1::        Pt does have vaginal itching & light discharge.  Additional Follow-up by: Lamar Sprinkles,  October 20, 2007 10:12 AM    Additional Follow-up for Phone Call Additional follow up Details #2::    advise pt to take flagyl 500 mg by mouth by mouth three times a day x 7 days.  call in rx  Follow-up by: D. Thomos Lemons DO,  October 20, 2007 3:14 PM  Additional Follow-up for Phone Call Additional follow up Details #3:: Details for Additional Follow-up Action Taken: Pt informed  Additional Follow-up by: Lamar Sprinkles,  October 20, 2007 4:05 PM  New/Updated Medications: METRONIDAZOLE 500 MG  TABS (METRONIDAZOLE) 1 three times a day x 7 days   Prescriptions: METRONIDAZOLE 500 MG  TABS (METRONIDAZOLE) 1 three times a day x 7 days  #21 x 0   Entered by:   Lamar Sprinkles   Authorized by:   D. Thomos Lemons DO   Signed by:   Lamar Sprinkles on 10/20/2007   Method used:   Electronically sent to ...       [facility] [address] [phone]*       [address]       [place], [place]  [address]       Ph: [phone] or [phone]       Fax: [phone]   RxID:   [id number]

## 2010-06-11 NOTE — Progress Notes (Signed)
  Phone Note Call from Patient Call back at Home Phone [phone]   Caller: Patient Summary of Call: paged at 5:28 pm on 12-29-07 about lower abdominal pain and lower back pain which has been going on all week. No nausea or fever or urinary sx. BM's are normal. Over the past few hours the pain has gotten severe. I advised her to go to the ER. Initial call taken by: Nelwyn Salisbury MD,  December 30, 2007 9:10 AM

## 2010-06-11 NOTE — Progress Notes (Signed)
Summary: Ct results  Phone Note From Other Clinic   Caller: Corrie Dandy-- [facility] Call For: DR Artist Pais Summary of Call: CT abd and pelvis----  NO stones--  Pt informed to go to ER if she has pain with percocet--otherwise she will call office in am Initial call taken by: Loreen Freud DO,  October 26, 2007 5:11 PM  Follow-up for Phone Call        Call pt - if back pain not better by next week.  I rec MRI of lumbosacral spine.   Have pt call office. Follow-up by: D. Thomos Lemons DO,  October 26, 2007 5:15 PM  Additional Follow-up for Phone Call Additional follow up Details #1::        Patient informed per Dr Artist Pais instructions Additional Follow-up by: Glendell Docker,  October 26, 2007 6:32 PM

## 2010-06-11 NOTE — Progress Notes (Signed)
Summary: QUESTION  Phone Note Outgoing Call   Summary of Call: call pt - labs normal Initial call taken by: D. Thomos Lemons DO,  March 19, 2008 4:24 PM  Follow-up for Phone Call        called pt and made her aware she stated she still wants to know how long should ther periods of ups and downs occur....one minute she feels fine and the next she is not....advised her Agustin Cree would be the one calling her back Follow-up by: Windell Norfolk,  March 19, 2008 5:09 PM  Additional Follow-up for Phone Call Additional follow up Details #1::        call pt - if palpitations persistent after another week.  I suggest f/u with cardiologist Additional Follow-up by: D. Thomos Lemons DO,  March 19, 2008 5:33 PM    Additional Follow-up for Phone Call Additional follow up Details #2::    Patient informed per Dr. Olegario Messier instructions. Follow-up by: Darra Lis RMA,  March 22, 2008 8:46 AM

## 2010-06-11 NOTE — Progress Notes (Signed)
Summary: PERCOCET NOT WORKING &NEED ABT SENT TO PHARMACY   Phone Note Call from Patient Call back at Home Phone [phone]   Caller: Patient Call For: DR. Artist Pais Summary of Call: PATIENT CALLED TO SAY THAT SHE IS IN ALOT OF PAIN AND THE PERCOCET ISN'T WORKING. PATIENT WAS ALSO EXPECTING ABT FOR UTI BE SENT TO HER PHARMACY BUT PHARMACY SAID IT'S NOT THERE. PLEASE CALL AND INFORM HER IF SHE NEEDS TO GO TO ED. Initial call taken by: Harlene Salts,  October 31, 2007 9:28 AM  Follow-up for Phone Call        Urine was clear on 6/18. CT was nl. Dr Artist Pais has rec an MRI.  OV with Dr Artist Pais tomorrow Take an extra Percocet. Also take Advil 200 mg 2 tabs by mouth two times a day-tid prn Follow-up by: Tresa Garter MD,  October 31, 2007 1:19 PM  Additional Follow-up for Phone Call Additional follow up Details #1::        Left voice message at [phone] to call Additional Follow-up by: Glendell Docker,  October 31, 2007 2:08 PM    Additional Follow-up for Phone Call Additional follow up Details #2::    Patient advised per Dr Posey Rea. Patient states that percocet in not resolving her pain and she states she may decide to go to ER if pain in not resolved. Patient offered a appointment for 11/01/07, but she declined ov.  Follow-up by: Glendell Docker,  October 31, 2007 2:30 PM  New/Updated Medications: CIPROFLOXACIN HCL 500 MG  TABS (CIPROFLOXACIN HCL) Take 1 tablet by mouth two times a day

## 2010-06-11 NOTE — Progress Notes (Signed)
Summary: continued back pain   Phone Note Call from Patient Call back at [facility] Phone [phone]   Summary of Call: Pt c/o continued lower back pain. Has shooting pain from buttock down leg. Seems as area is swollen.  Initial call taken by: Lamar Sprinkles,  October 26, 2007 9:02 AM  Follow-up for Phone Call        Needs OV Follow-up by: D. Thomos Lemons DO,  October 26, 2007 9:12 AM  Additional Follow-up for Phone Call Additional follow up Details #1::        GAVE PT 10/26/07 @ 115P W/DR YOO--PHONE Additional Follow-up by: Ivar Bury,  October 26, 2007 1:10 PM

## 2010-06-11 NOTE — Progress Notes (Signed)
Summary: pt needs refill right away had another episode   Phone Note Refill Request Call back at Home Phone [phone] Message from:  Patient  Refills Requested: Medication #1:  TOPROL XL 25 MG  TB24 Take 1 tablet by mouth once daily  Medication #2:  PROTONIX 40 MG  TBEC Take 1 tablet by mouth once a day pt needs right away  Initial call taken by: Omer Jack,  April 18, 2009 12:49 PM  Follow-up for Phone Call        Spoke with Pt and she is aware that she needs to get her protonix from Dr Artist Pais and that for more refills for toprol she will need an appointment. Follow-up by: Hardin Negus, RMA,  April 18, 2009 3:12 PM    Prescriptions: TOPROL XL 25 MG  TB24 (METOPROLOL SUCCINATE) Take 1 tablet by mouth once daily  #30 x 1   Entered by:   Hardin Negus, RMA   Authorized by:   Dolores Patty, MD, [facility]   Signed by:   Hardin Negus, RMA on 04/18/2009   Method used:   Electronically to        [facility] [address] [phone]* (retail)       [address]       [place], [place]  [address]       Ph: [phone] or [phone]       Fax: [phone]   RxID:   [id number]

## 2010-06-11 NOTE — Progress Notes (Signed)
Summary: PAIN   Phone Note Call from Patient Call back at Home Phone [phone]   Summary of Call: Pt is req med for pain, says she is having sharp pain in her lower back.  Initial call taken by: Lamar Sprinkles,  October 20, 2007 11:09 AM  Follow-up for Phone Call        ok to call in vicodin 5/500 # 30 one by mouth q12 hrs as needed.  no refill.  If back pain not improved, needs ov Follow-up by: D. Thomos Lemons DO,  October 20, 2007 3:12 PM  Additional Follow-up for Phone Call Additional follow up Details #1::        Pt informed  Additional Follow-up by: Lamar Sprinkles,  October 20, 2007 4:04 PM    New/Updated Medications: VICODIN 5-500 MG  TABS (HYDROCODONE-ACETAMINOPHEN) 1 q 12 hrs as needed pain   Prescriptions: VICODIN 5-500 MG  TABS (HYDROCODONE-ACETAMINOPHEN) 1 q 12 hrs as needed pain  #30 x 0   Entered by:   Lamar Sprinkles   Authorized by:   D. Thomos Lemons DO   Signed by:   Lamar Sprinkles on 10/20/2007   Method used:   Faxed to ...       [facility] [address] [phone]*       [address]       [place], [place]  [address]       Ph: [phone] or [phone]       Fax: [phone]   RxID:   [id number]

## 2010-06-11 NOTE — Progress Notes (Signed)
Summary: ?further testing  Phone Note Call from Patient Call back at Home Phone [phone]   Call For: Artist Pais Summary of Call: Pt calling to let you know she is still having r hip pain. Pt has returned to work today after a few days off. Pt is wondering if MRI is her next step? Please call her regarding further testing. Initial call taken by: Verdell Face,  November 06, 2007 1:03 PM  Follow-up for Phone Call        last visit, pt complained of back pain.  Has pain changed to hip pain?  I would rec f/u visit, so we can reassess and figure out which test would be appropriate. Follow-up by: D. Thomos Lemons DO,  November 08, 2007 12:04 AM  Additional Follow-up for Phone Call Additional follow up Details #1::        Patient iformed per Dr Artist Pais instructions,  detailed voice message left at [phone]. Patient is to schedule office visit for further evaluation per Dr Artist Pais. Additional Follow-up by: Glendell Docker,  November 09, 2007 9:45 AM

## 2010-06-11 NOTE — Assessment & Plan Note (Signed)
Summary: PER PHONE NOTE/ITCHING /NWS   Vital Signs:  Patient Profile:   45 Years Old Female Height:     70 inches Weight:      190.50 pounds BMI:     27.43 Temp:     98.6 degrees F oral Pulse rate:   84 / minute BP sitting:   102 / 72  (right arm)  Vitals Entered By: Glendell Docker (August 03, 2007 2:18 PM)                 Chief Complaint:  RETURN OFFICE C/O SINUS/COLD PROBLEMS.  History of Present Illness: 45 y/o AA female reports diffuse itching and hives after taking amox and allergra for suspected sinus infection ([facility] urgent care).  She denies tongue swelling.     Current Allergies (reviewed today): ! PSEUDOEPHEDRINE HCL (PSEUDOEPHEDRINE HCL) PENICILLIN  Past Medical History:    Reviewed history from 07/25/2007 and no changes required:       Hypertension       History of atypical chest pain  (negative cardiac CT)       History of palpitations       GERD       History of headaches  Past Surgical History:    Reviewed history from 07/25/2007 and no changes required:       Caesarean section x 2 - 1988 and 2008   Family History:    Reviewed history from 07/25/2007 and no changes required:       Mother is age 69 - healthy       Father deceased age 37 - diabetes, CAD       Brother with type II diabetes (She has 6 siblings)  Social History:    Reviewed history from 07/25/2007 and no changes required:       Occupation: Psychologist, 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       Never Smoked       Alcohol use-no     Physical Exam  General:     alert, well-developed, and well-nourished.   Mouth:     Oral mucosa and oropharynx without lesions or exudates.  Teeth in good repair. Neck:     supple and no masses.   Lungs:     normal respiratory effort and normal breath sounds.   Heart:     normal rate, regular rhythm, no murmur, and no gallop.   Skin:     scattered wheals over chest and arms.    Impression &  Recommendations:  Problem # 1:  URTICARIA (ICD-708.9) Pt developed hives and diffuse pruritus after taking amoxicillin and allegra for possible sinus infection.   I suspect amox is culprit.  Treat with prednisone taper over 12 days.  Samples of xyzal provided.  Complete Medication List: 1)  Toprol Xl 25 Mg Tb24 (Metoprolol succinate) .... Take 1 tablet by mouth two times a day 2)  Protonix 40 Mg Tbec (Pantoprazole sodium) .... Take 1 tablet by mouth once a day 3)  Multivitamins Tabs (Multiple vitamin) .... Take 1 tablet by mouth once a day 4)  Prednisone 10 Mg Tabs (Prednisone) .... 4 tabs by mouth once daily x 3 days, 3 tabs by mouth once daily x 3 days, 2 tabs by mouth once daily x 3 days, 1 tab by mouth once daily x 3 days   Patient Instructions: 1)  Please schedule a follow-up appointment in 2 weeks. 2)  Take xyzal 5  mg by mouth once daily (pm).      Prescriptions: PREDNISONE 10 MG  TABS (PREDNISONE) 4 tabs by mouth once daily x 3 days, 3 tabs by mouth once daily x 3 days, 2 tabs by mouth once daily x 3 days, 1 tab by mouth once daily x 3 days  #30 x 0   Entered and Authorized by:   D. Thomos Lemons DO   Signed by:   D. Thomos Lemons DO on 08/03/2007   Method used:   Electronically sent to ...       [facility] [address] [phone]*       [address]       [place], [place]  [address]       Ph: [phone] or [phone]       Fax: [phone]   RxID:   [id number]  ] Current Allergies (reviewed today): ! PSEUDOEPHEDRINE HCL (PSEUDOEPHEDRINE HCL) PENICILLIN

## 2010-06-26 ENCOUNTER — Inpatient Hospital Stay (INDEPENDENT_AMBULATORY_CARE_PROVIDER_SITE_OTHER)
Admission: RE | Admit: 2010-06-26 | Discharge: 2010-06-26 | Disposition: A | Discharge status: Home / Self Care | Payer: Self-pay | Source: Ambulatory Visit | Attending: Family Medicine | Admitting: Family Medicine

## 2010-06-26 DIAGNOSIS — J040 Acute laryngitis: Secondary | ICD-10-CM

## 2010-06-26 DIAGNOSIS — J019 Acute sinusitis, unspecified: Secondary | ICD-10-CM

## 2010-06-30 ENCOUNTER — Inpatient Hospital Stay (INDEPENDENT_AMBULATORY_CARE_PROVIDER_SITE_OTHER)
Admission: RE | Admit: 2010-06-30 | Discharge: 2010-06-30 | Disposition: A | Discharge status: Home / Self Care | Payer: Self-pay | Source: Ambulatory Visit | Attending: Emergency Medicine | Admitting: Emergency Medicine

## 2010-06-30 DIAGNOSIS — J040 Acute laryngitis: Secondary | ICD-10-CM

## 2010-06-30 DIAGNOSIS — J40 Bronchitis, not specified as acute or chronic: Secondary | ICD-10-CM

## 2010-06-30 LAB — POCT URINALYSIS DIPSTICK
Bilirubin Urine: NEGATIVE
Hgb urine dipstick: NEGATIVE
Ketones, ur: NEGATIVE mg/dL
Nitrite: NEGATIVE
Protein, ur: NEGATIVE mg/dL
Specific Gravity, Urine: 1.015 (ref 1.005–1.030)
Urine Glucose, Fasting: NEGATIVE mg/dL
Urobilinogen, UA: 0.2 mg/dL (ref 0.0–1.0)
pH: 6.5 (ref 5.0–8.0)

## 2010-07-21 LAB — WET PREP, GENITAL
Trich, Wet Prep: NONE SEEN
Yeast Wet Prep HPF POC: NONE SEEN

## 2010-07-21 LAB — GC/CHLAMYDIA PROBE AMP, GENITAL
Chlamydia, DNA Probe: NEGATIVE
GC Probe Amp, Genital: NEGATIVE

## 2010-08-02 LAB — RAPID STREP SCREEN (MED CTR MEBANE ONLY): Streptococcus, Group A Screen (Direct): NEGATIVE

## 2010-08-16 LAB — POCT URINALYSIS DIP (DEVICE)
Bilirubin Urine: NEGATIVE
Glucose, UA: NEGATIVE mg/dL
Hgb urine dipstick: NEGATIVE
Ketones, ur: NEGATIVE mg/dL
Nitrite: NEGATIVE
Protein, ur: NEGATIVE mg/dL
Specific Gravity, Urine: 1.02 (ref 1.005–1.030)
Urobilinogen, UA: 0.2 mg/dL (ref 0.0–1.0)
pH: 6 (ref 5.0–8.0)

## 2010-08-22 ENCOUNTER — Inpatient Hospital Stay (INDEPENDENT_AMBULATORY_CARE_PROVIDER_SITE_OTHER)
Admission: RE | Admit: 2010-08-22 | Discharge: 2010-08-22 | Disposition: A | Discharge status: Home / Self Care | Payer: Self-pay | Source: Ambulatory Visit

## 2010-08-22 ENCOUNTER — Emergency Department (HOSPITAL_COMMUNITY)
Admission: EM | Admit: 2010-08-22 | Discharge: 2010-08-23 | Disposition: A | Discharge status: Home / Self Care | Payer: Self-pay | Attending: Emergency Medicine | Admitting: Emergency Medicine

## 2010-08-22 DIAGNOSIS — R1084 Generalized abdominal pain: Secondary | ICD-10-CM

## 2010-08-22 DIAGNOSIS — Z79899 Other long term (current) drug therapy: Secondary | ICD-10-CM | POA: Insufficient documentation

## 2010-08-22 DIAGNOSIS — R109 Unspecified abdominal pain: Secondary | ICD-10-CM | POA: Insufficient documentation

## 2010-08-22 DIAGNOSIS — N949 Unspecified condition associated with female genital organs and menstrual cycle: Secondary | ICD-10-CM | POA: Insufficient documentation

## 2010-08-22 DIAGNOSIS — N898 Other specified noninflammatory disorders of vagina: Secondary | ICD-10-CM | POA: Insufficient documentation

## 2010-08-22 DIAGNOSIS — R300 Dysuria: Secondary | ICD-10-CM | POA: Insufficient documentation

## 2010-08-22 DIAGNOSIS — K219 Gastro-esophageal reflux disease without esophagitis: Secondary | ICD-10-CM | POA: Insufficient documentation

## 2010-08-22 LAB — WET PREP, GENITAL
Trich, Wet Prep: NONE SEEN
Yeast Wet Prep HPF POC: NONE SEEN

## 2010-08-23 ENCOUNTER — Emergency Department (HOSPITAL_COMMUNITY): Payer: Self-pay

## 2010-08-23 LAB — CBC
HCT: 39.1 % (ref 36.0–46.0)
Hemoglobin: 13.1 g/dL (ref 12.0–15.0)
MCH: 29.9 pg (ref 26.0–34.0)
MCHC: 33.5 g/dL (ref 30.0–36.0)
MCV: 89.3 fL (ref 78.0–100.0)
Platelets: 248 10*3/uL (ref 150–400)
RBC: 4.38 MIL/uL (ref 3.87–5.11)
RDW: 12.4 % (ref 11.5–15.5)
WBC: 9.1 10*3/uL (ref 4.0–10.5)

## 2010-08-23 LAB — DIFFERENTIAL
Basophils Absolute: 0 10*3/uL (ref 0.0–0.1)
Basophils Relative: 0 % (ref 0–1)
Eosinophils Absolute: 0 10*3/uL (ref 0.0–0.7)
Eosinophils Relative: 0 % (ref 0–5)
Lymphocytes Relative: 33 % (ref 12–46)
Lymphs Abs: 3 10*3/uL (ref 0.7–4.0)
Monocytes Absolute: 1 10*3/uL (ref 0.1–1.0)
Monocytes Relative: 11 % (ref 3–12)
Neutro Abs: 5 10*3/uL (ref 1.7–7.7)
Neutrophils Relative %: 55 % (ref 43–77)

## 2010-08-23 LAB — COMPREHENSIVE METABOLIC PANEL
ALT: 17 U/L (ref 0–35)
AST: 17 U/L (ref 0–37)
Albumin: 3.1 g/dL — ABNORMAL LOW (ref 3.5–5.2)
Alkaline Phosphatase: 91 U/L (ref 39–117)
BUN: 5 mg/dL — ABNORMAL LOW (ref 6–23)
CO2: 24 mEq/L (ref 19–32)
Calcium: 8.5 mg/dL (ref 8.4–10.5)
Chloride: 111 mEq/L (ref 96–112)
Creatinine, Ser: 0.92 mg/dL (ref 0.4–1.2)
GFR calc Af Amer: >60 mL/min (ref >60)
GFR calc non Af Amer: >60 mL/min (ref >60)
Glucose, Bld: 83 mg/dL (ref 70–99)
Potassium: 4.1 mEq/L (ref 3.5–5.1)
Sodium: 141 mEq/L (ref 135–145)
Total Bilirubin: 0.7 mg/dL (ref 0.3–1.2)
Total Protein: 6.5 g/dL (ref 6.0–8.3)

## 2010-08-23 MED ORDER — IOHEXOL 300 MG/ML  SOLN
100.0000 mL | Freq: Once | INTRAMUSCULAR | Status: AC | PRN
  Administered 2010-08-23: 100 mL via INTRAVENOUS

## 2010-08-24 LAB — POCT PREGNANCY, URINE: Preg Test, Ur: NEGATIVE

## 2010-08-24 LAB — POCT URINALYSIS DIP (DEVICE)
Glucose, UA: NEGATIVE mg/dL
Hgb urine dipstick: NEGATIVE
Nitrite: NEGATIVE
Protein, ur: NEGATIVE mg/dL
Specific Gravity, Urine: 1.025 (ref 1.005–1.030)
Urobilinogen, UA: 0.2 mg/dL (ref 0.0–1.0)
pH: 5.5 (ref 5.0–8.0)

## 2010-08-24 LAB — GC/CHLAMYDIA PROBE AMP, GENITAL
Chlamydia, DNA Probe: NEGATIVE
GC Probe Amp, Genital: NEGATIVE

## 2010-09-22 NOTE — Op Note (Signed)
NAMEYOUNG, BRIM              ACCOUNT NO.:  [id number]   MEDICAL RECORD NO.:  [id number]          PATIENT TYPE:  INP   LOCATION:  9105                          FACILITY:  [facility]   PHYSICIAN:  Randye Lobo, M.D.   DATE OF BIRTH:  1965-11-16   DATE OF PROCEDURE:  11/17/2006  DATE OF DISCHARGE:                               OPERATIVE REPORT   PREOPERATIVE DIAGNOSIS:  1. The intrauterine gestation at 39.1 weeks.  2. History of prior cesarean section, desires repeat cesarean section.  3. Multiparous female, desires permanent sterilization.   POSTOPERATIVE DIAGNOSIS:  1. Intrauterine gestation at 39.1 weeks.  2. History of prior cesarean section, desires repeat cesarean section.  3. Multiparous female, desires permanent sterilization.  4. The left ovarian adhesion.   PROCEDURE:  Is a repeat low segment transverse cesarean section with  bilateral tubal ligation by the modified Pomeroy technique, lysis of  adhesions.   SURGEON:  Conley Simmonds, M.D.   ASSISTANT:  Miguel Aschoff, M.D.   ANESTHESIA:  Spinal.   IV FLUIDS:  2000 mL Ringer's lactate.   ESTIMATED BLOOD LOSS:  600 mL   URINE OUTPUT:  200 mL   COMPLICATIONS:  None.   INDICATIONS FOR PROCEDURE:  The patient is a 45 year old [place]  American female with a history of a prior cesarean section for failure  to progress, who presents now at term and desires a repeat cesarean  section and permanent sterilization.  Risks, benefits, and alternatives  have been discussed with the patient who wishes to proceed.  The patient  understands that there is a risk of failure of the tubal ligation of  approximately 1 in 250 to 1 in 300 which may result in either an  intrauterine or an ectopic pregnancy.  The patient chooses to proceed.   FINDINGS:  A viable female was delivered at 1355 with Apgars of 9 at 1  minute and 9 at 5 minutes.  There was a nuchal cord x1 noted.  The  weight of the newborn was 8 pounds 6 ounces.  The amniotic fluid  was  clear.  The placenta had a normal insertion of a three-vessel cord and  had a fundal and anterior insertion.  The uterus, right ovary, and the  bilateral fallopian tubes were unremarkable.  There was an adhesion  between the left ovary and the left uterine fundus.   SPECIMENS:  A portion of the right and left fallopian tubes were sent to  pathology separately.   PROCEDURE:  The patient was reidentified in the preoperative hold area.  She did receive Ancef 1 gram IV for antibiotic prophylaxis.   In the operating room, a spinal anesthetic was administered and the  patient was placed in the supine position with a left lateral tilt.  The  abdomen was sterilely prepped and a Foley catheter was placed inside the  bladder.  She was sterilely draped.   The procedure began with a Pfannenstiel incision along the patient's  previous Pfannenstiel scar.  The incision was carried down to the fascia  using monopolar cautery for hemostasis.  The fascia was incised  in the  midline and the incision was extended bilaterally with the Mayo  scissors.  The rectus muscles were separated from the fascia superiorly  and inferiorly using sharp dissection.  The parietal peritoneum was  elevated with two hemostat clamps and was entered sharply.  The rectus  muscles were then sharply divided inferiorly.  The parietal peritoneal  incision was extended cranially and caudally.   The lower uterine segment was exposed with the bladder retractor.  A  bladder flap was sharply created.  A transverse lower uterine segment  incision was created with a scalpel.  Membranes were ruptured bluntly.  The uterine incision was extended bluntly.  A hand was inserted through  the uterine incision and the vertex was elevated up to the incision.  A  Mityvac was used for assistance of the vertex over two efforts.  There  was one pop off.  The newborn was delivered within the second  application of the Mityvac. Head was delivered  without difficulty.  The  nuchal cord was noted.  The newborn was delivered through the nuchal  cord.  The nares and mouth were suctioned and the cord was doubly  clamped and cut. The newborn was then carried over to the awaiting  pediatricians in vigorous condition.   The placenta was manually extracted and was set aside for cord blood  donation and usual cord blood collection.   The uterus was exteriorized at this time and it was wiped clean with a  moistened lap pad.  The uterine incision was closed with a double layer  closure of #1 chromic.  The first was a running locked layer and a  second was an imbricating layer.  There was some additional bleeding  from the left apex of the incision and this responded to figure-of-eight  sutures of #1 chromic for good hemostasis.   The tubal ligation was performed next.  The right fallopian tube was  grasped with a Babcock clamp and followed all the way to its fimbriated  end.  A free tie of 0 plain was placed along the isthmic portion of the  tissue in order to create a loop of tissue.  A hemostat was brought  through the mesosalpinx and a free tie of 2-0 plain was then placed at  the base of each loop of the tissue.  The intervening portion of  fallopian tube was excised sharply was sent to pathology.  Hemostasis  was good.  The same procedure that was performed on the right fallopian  tube was then repeated on the left fallopian tube after it was followed  all the way to its fimbriated end.  Again the intervening section of  fallopian tube was excised and sent to pathology separately from the  other side.  The adhesion between the uterine fundus and the ovary was  lysed with monopolar cautery.   The uterus was returned to the peritoneal cavity at this time.  The  tubal sites were noted to be intact.   The uterine incision was examined and noted to be hemostatic after the  peritoneal cavity was irrigated and suctioned.  The abdomen was  closed  at this time.  A running suture of 3-0 Vicryl was placed along the  parietal peritoneum.  The rectus muscles were reapproximated with  interrupted sutures of #1 chromic.  This fascia was closed with a  running suture of 0 Vicryl.  The subcutaneous layer was irrigated and  made hemostatic with monopolar cautery.  The  skin was closed with  staples and a sterile bandage was placed over this.   This concluded the patient's procedure.  There were no complications.  All needle, instrument, sponge counts were correct.      Randye Lobo, M.D.  Electronically Signed     BES/MEDQ  D:  11/17/2006  T:  11/18/2006  Job:  [id number]

## 2010-09-22 NOTE — Assessment & Plan Note (Signed)
[facility]                                 ON-CALL NOTE   NAME:[name], [name]                       MRN:          [id number]  DATE:08/05/2007                            DOB:          Apr 01, 1966    She calls from [phone], a patient of Dr. Artist Pais.  She calls stating she  was on prednisone taper pack for ALLERGIC REACTION TO PENICILLIN but is  having swelling of the ankles and feet and when she was reading the side  effects noted that it said to call her physician immediately if there  was any swelling of the lower extremities.  She has no shortness of  breath or chest pain, no other symptoms.  Tomorrow is supposed to be her  third day of taking 4 pills.  I recommended she take 3 tomorrow and 3  the next day and then 2 for two days and 1 for two days instead of  taking three days of each dosage, to get off of it a little quicker.  She states the itching and rash from the PENICILLIN ALLERGY were  significantly reduced already, she was having no symptoms as far as that  was concerned.  She is to call back if she has any further complaints.     Lelon Perla, DO  Electronically Signed    Shawnie Dapper  DD: 08/05/2007  DT: 08/06/2007  Job #: [id number]   cc:   Barbette Hair. Artist Pais, DO

## 2010-09-22 NOTE — Assessment & Plan Note (Signed)
[facility]                            CARDIOLOGY OFFICE NOTE   NAME:Gren, REX MAGEE                     MRN:          [id number]  DATE:06/09/2007                            DOB:          1966-03-03    INTERVAL HISTORY:  Ms. Elderkin is a very pleasant 45 year old woman with  history of palpitations and chest pain.  She was evaluated in the past  with an exercise stress test which showed worsening of her baseline EKG  changes with question of mild anterior ischemia, then underwent cardiac  CT which had a calcium score of zero and showed no luminal coronary  artery disease.  She was placed on Toprol for her palpitations and chest  pain, and she did quite well.   She recently had a baby 6 months ago and ran out of her Toprol.  She  again experienced chest pain and palpitations.  She was seen in the  emergency room.  EKG was normal, cardiac markers were normal and EKG was  unchanged.  She was discharged home and started back on her Toprol.  Since starting back on her Toprol, she has felt much better, just  occasional palpitations.  No syncope or presyncope.   CURRENT MEDICATIONS:  1. Multivitamin.  2. Toprol 25 a day.  3. Protonix 40 a day.   PHYSICAL EXAMINATION:  GENERAL:  She is well-appearing in no acute  distress.  Ambulates around the clinic without respiratory difficulty.  VITAL SIGNS:  Blood pressure is 100/68, heart rate 60, weight is 193.  HEENT:  Normal.  NECK:  Supple.  No JVD.  Carotids are 2+ bilaterally without any bruits.  There is no lymphadenopathy or thyromegaly.  CARDIAC:  Regular rate and rhythm with occasional ectopic beats, 2/6  systolic ejection murmur at the left sternal border.  LUNGS:  Clear.  ABDOMEN:  Soft, nontender, nondistended.  There is no  hepatosplenomegaly, no bruits, no masses.  Good bowel sounds.  EXTREMITIES:  Warm with no cyanosis, clubbing or edema.  No rash.  NEURO:  Alert and oriented x3.  Cranial  nerves II-XII are intact.  Moves  all four extremities without difficulty.   DIAGNOSTICS:  1. Echocardiogram from June 05, 2007 shows an EF of 65% with no      significant valvular disease.  2. EKG shows sinus rhythm with diffuse T-wave inversions which are      chronic.  Heart rate is 60.   ASSESSMENT/PLAN:  Chest pain/palpitations.  I do not think this is  ischemic in nature.  She has responded well to Toprol, but seems like  she could still have some benefit from a slightly higher dose.  I would  like to put her back on Toprol 50 a day, but given the shortage, we will  switch her over to short-acting Lopressor 25 b.i.d. for the next month  and then we can put her back on Toprol 50 a day when it is back in  stock.   DISPOSITION:  We will see her back in 6 months.  I told her to call me  if she was  having any more symptoms.     Bevelyn Buckles. Bensimhon, MD  Electronically Signed    DRB/MedQ  DD: 06/09/2007  DT: 06/10/2007  Job #: [id number]

## 2010-09-22 NOTE — Assessment & Plan Note (Signed)
[facility]                            CARDIOLOGY OFFICE NOTE   NAME:Katherine Gregory, Katherine Gregory                     MRN:          [id number]  DATE:03/27/2008                            DOB:          11/01/1965    PRIMARY CARDIOLOGIST:  Bevelyn Buckles. Bensimhon, MD   PRIMARY CARE PHYSICIAN:  Barbette Hair. Artist Pais, DO   This is a very pleasant 45 year old African American female patient who  has a history of chest pain and palpitations.  She has had a negative  ischemic workup in the past including cardiac CT and has been treated  with Toprol 50 mg.  On Thursday, March 21, 2008, when she was at  choir practice with her church, she became dizzy and felt her heart  racing.  Her blood pressure went up to 170/100 and her church called  911.  In the emergency room, her vital signs are stated as normal and  labs were all within normal limits.  She said she has been on a fast  with her church in which she was just eating fruits and vegetables and  drinking fruit juice.  She would eat meat on the weekends.  She denies  any caffeine use or supplements.  Since that time, Dr. Artist Pais has decreased  her Toprol to 25 mg daily I believe because of complaints of fatigue.  She has not had any increased palpitations on this lower dose and this  is the first event since January when she saw Dr. Gala Romney that she has  had.   CURRENT MEDICATIONS:  1. Toprol-XL 25 mg daily.  2. Protonix 40 mg daily.  3. Multivitamin daily.   PHYSICAL EXAMINATION:  GENERAL:  This is a pleasant 45 year old African  American female in no acute distress.  VITAL SIGNS:  Blood pressure 106/64, pulse 64, weight 180.  NECK:  Without JVD, HJR, bruit, or thyroid enlargement.  LUNGS:  Clear anterior, posterior, and lateral.  HEART:  Regular rate and rhythm at 64 beats per minute, normal S1 and S2  with a 2/6 systolic ejection murmur at the left sternal border.  ABDOMEN:  Soft without organomegaly, masses, lesions,  or abnormal  tenderness.  EXTREMITIES:  Without cyanosis, clubbing, or edema.  She has good distal  pulses.   EKG:  Sinus bradycardia with anterolateral T-wave inversion, no acute  change from prior tracings.   IMPRESSION:  1. Palpitations with history of premature ventricular contractions on      recent emergency room visit.  2. History of chest pain with negative cardiac CT and passed   PLAN:  I have told the patient she can stay on the 25 mg of Toprol for  now, but if she has increased palpitations she should go back up to 50.  I also told her if she develops palpitations  on the 50 mg, she can also take an extra half a tablet during these  episodes and call us if they continue.  She is to follow up with Dr.  Gala Romney in January.      Jacolyn Reedy, PA-C  Electronically Signed  Marca Ancona, MD  Electronically Signed   ML/MedQ  DD: 03/27/2008  DT: 03/27/2008  Job #: [id number]   cc:   Barbette Hair. Artist Pais, DO

## 2010-09-22 NOTE — Consult Note (Signed)
Katherine Gregory, Katherine Gregory              ACCOUNT NO.:  [id number]   MEDICAL RECORD NO.:  [id number]          PATIENT TYPE:  EMS   LOCATION:  MAJO                         FACILITY:  [facility]   PHYSICIAN:  Veverly Fells. Excell Seltzer, MD  DATE OF BIRTH:  [birth year]   DATE OF CONSULTATION:  DATE OF DISCHARGE:  06/02/2007                                 CONSULTATION   REASON FOR CONSULTATION:  Chest pain.   HISTORY OF PRESENT ILLNESS:  A 45 year old African-American female who  while at work began to feel chest pressure, substernal pressure lasting  approximately 20 to 30 minutes while putting paper on a cart.  Lessened  when resting but did not go away.  She had some trouble breathing but  noticed some palpitations with some mild dizziness.  She went to urgent  care and was given a nitroglycerin and was advised to come to the  emergency room.  The patient has a history of frequent palpitations and  had been on Toprol XL 25 mg once a day but ran out three days ago.  The  patient had been followed by Dr. Gala Romney approximately two years ago  but has not followed with him in the office since that time.  The  patient did have in the past a cardiac CT which was negative for  coronary artery disease.  She also had a coronary artery  __________ .  They had an exercise stress test that showed positive EKG changes with  question of mild anterior ischemia with an EF of 60%.  The patient also  has a history of pleural pericarditis and GERD.  On the last visit with  Dr. Gala Romney, the patient was seen in July of 2007 treated with beta-  blocker and PPI and was to follow with Dr. Gala Romney on a p.r.n. basis.  She was also told to reduce her caffeine and stop taking medications  with pseudoephedrine.   The patient has had frequent visits to the emergency room secondary to  palpitations, usually is given a prescription for Toprol and is told to  follow with Dr. Gala Romney which she has failed to do, and secondary to  medical cost, the patient does have an appointment already scheduled  with Dr. Gala Romney on May 13, 2007 prior to this ER evaluation.  Currently, the patient is pain free.   REVIEW OF SYSTEMS:  Positive for dizziness, chest pain, shortness of  breath, and pressure relieved with rest, otherwise negative.   PAST MEDICAL HISTORY:  Pleural pericarditis, chronic sinus infections,  palpitations, and GERD.   PAST CARDIAC WORKUP:  Coronary CT with a score of 0 in June of 2007,  cardiac stress test prior to that which had abnormal EKG but negative  for ischemia.   PAST SURGICAL HISTORY:  C-section.   SOCIAL HISTORY:  She lives in [place].  She has just had a baby about  six months ago.  She works for a [employer].  She is unmarried.  She has another child 29 years old.  She does not smoke.  She does not  drink alcohol.  She does not drink  caffeine.   CURRENT FAMILY HISTORY:  Father had coronary artery bypass grafting in  his 5s.  Mother had chronic leg pain.  She has siblings in good health.   CURRENT MEDICATIONS:  1. Toprol XL 25 mg once a day.  2. Prilosec once a day.  3. Multivitamin once a day, but the patient has not been taking for      the last three days.   ALLERGIES:  NO KNOWN DRUG ALLERGIES.   CURRENT LABS:  Hemoglobin 15.0, her hematocrit is 44.0.  Sodium 138,  potassium is 3.8, chloride 107, CO2 41, BUN 9, creatinine 1.1, glucose  of 83, CK 47.2, MB less than 1.0, troponin less than 0.05, D-dimer 0.48.  EKG revealing normal sinus rhythm with inferior lateral anterior T-wave  inversion.  The lateral inversion is new.  Chest x-ray revealing no  acute infiltrates or edema.   PHYSICAL EXAMINATION:  VITAL SIGNS:  Blood pressure is 120/84, pulse 78,  respirations 12, temperature 99.6, O2 sat 99% on room air.  HEENT:  Head is normocephalic, atraumatic.  Eyes:  PERRLA.  Mucous  membranes mouth pink and moist.  Tongue midline.  NECK:  Supple, no JVD, no carotid  bruits appreciated.  CARDIOVASCULAR:  Regular rate and rhythm without murmurs, rubs or  gallops or extra systoles at present.  LUNGS:  Clear to auscultation.  ABDOMEN:  Soft, nontender with 2+ bowel sounds.  EXTREMITIES:  Without clubbing, cyanosis or edema.  NEUROLOGIC:  Cranial nerves II-XII are grossly intact.   IMPRESSION:  1. Chest pain with EKG changes inferior and anterior laterally, not      new.  2. History of palpitations and not taking Toprol times the last three      days.  3. Gastroesophageal reflux disease.   PLAN:  The patient had been seen and examined by myself and Dr. Tonny Bollman in the emergency room.  The patient's recurrent chest pain  evaluation in the past with normal CTA and a calcium score of 0 and  normal coronary.  Her exam is normal.  There are no cardiovascular risk  factors.  EKG shows sinus rhythm with diffuse T-wave abnormalities with  minimal change from old EKG.  D-dimer is negative.  Suspect noncardiac  chest pain.  Will release to go home with empiric PPI and resume  metoprolol.  The patient's symptoms have started since she stopped  metoprolol for three days.  The patient will have an outpatient  appointment with an echocardiogram and a follow up with Dr. Gala Romney.  An appointment has been made for echocardiogram on January 26 at 11:30  p.m. and follow up appointment was previously scheduled with Dr.  Gerald Dexter January 30 at 4:00 p.m.      Bettey Mare. Lyman Bishop, NP      Veverly Fells. Excell Seltzer, MD  Electronically Signed    KML/MEDQ  D:  06/02/2007  T:  06/02/2007  Job:  [id number]

## 2010-09-25 NOTE — Discharge Summary (Signed)
NAMESEBASTIANA, Katherine Gregory              ACCOUNT NO.:  [id number]   MEDICAL RECORD NO.:  [id number]          PATIENT TYPE:  INP   LOCATION:  9105                          FACILITY:  [facility]   PHYSICIAN:  Malva Limes, M.D.    DATE OF BIRTH:  1965-09-18   DATE OF ADMISSION:  11/17/2006  DATE OF DISCHARGE:  11/20/2006                               DISCHARGE SUMMARY   FINAL DIAGNOSES:  1. Intrauterine pregnancy at 81 weeks' gestation.  2. History of prior cesarean section.  3. The patient desires repeat cesarean section.  4. The patient desires permanent sterilization and left ovarian      adhesions.  5. Positive group B Streptococcus.  6. Advanced maternal age.   PROCEDURE:  Repeat low transverse cesarean section and bilateral tubal  ligation using the modified Pomeroy technique, also lysis of adhesions.  Surgeon Dr. Conley Simmonds.  Assistant Dr. Miguel Aschoff.  Complications none.   This 45 year old, G3, P 1-0-1-1, presents at term for repeat cesarean  section.  The patient had a previous cesarean section with her last  pregnancy and desires repeat with this pregnancy as well as well as  expressed her desire for permanent sterilization.  The patient's  antepartum course up to this point had been complicated by advanced  maternal age.  The patient did have amniocentesis which showed a 73 XY  karyotype.  The patient also has the herpes virus but was not having any  breakouts during her pregnancy. The patient also had a positive group B  strep culture obtained in our office at about 35 weeks.   The patient was taken to the operating room on November 17, 2006, where a  repeat low transverse cesarean section was performed with the delivery  of an 8 pound 6 ounce female infant with Apgars of 9/9.  Delivery went  without complication.  The patient still expressed her desire for  permanent sterilization which was performed.  There were also some left  ovarian adhesions that were lysed before the end of the  procedure.   The patient's postoperative course was benign without any significant  fevers.  The patient was felt ready for discharge on postoperative day  #3.  Was sent home on a regular diet, told to decrease activities, told  to continue her prenatal vitamins and her Protonix and Toprol XL as  directed, was given Percocet one to two every 4 hours as needed for  pain, told she could use Motrin up to 600 mg every 6 hours as needed for  the pain, was to follow up in our office in 4 weeks.  Instructions and  precautions were reviewed with the patient.  The patient's final blood  work showed a hemoglobin of 12.8, white blood cell count of 9.6, and  platelets of 223,000.      Leilani Able, P.A.-C.    ______________________________  Malva Limes, M.D.   MB/MEDQ  D:  12/13/2006  T:  12/13/2006  Job:  [id number]

## 2010-09-25 NOTE — H&P (Signed)
Katherine Gregory, Katherine Gregory              ACCOUNT NO.:  [id number]   MEDICAL RECORD NO.:  [id number]          PATIENT TYPE:  INP   LOCATION:  1826                         FACILITY:  [facility]   PHYSICIAN:  Melissa L. Ladona Ridgel, MD  DATE OF BIRTH:  February 01, 1966   DATE OF ADMISSION:  10/06/2005  DATE OF DISCHARGE:                                HISTORY & PHYSICAL   CHIEF COMPLAINT:  Chest pain, palpitation.   PRIMARY CARE PHYSICIAN:  Unassigned, although she does see Dr. Haroldine Laws, who  recently prescribed her medication.   HISTORY OF PRESENT ILLNESS:  The patient is a 45 year old African American  female who noticed her heart was fluttering over the past couple of days.  She states that the onset was approximately Sunday evening and she thought  it was secondary to the medications that she was taking for her sinus  infection.  She states she started those medications a month ago.  The  primary ingredient in her decongestant is Sudafed.  The patient states that  Sunday she noted some chest tightness and tonight she had intermittent  lightning-like chest pain bolts to her chest that then became more like  chest pressure.  She states she never had this pain before and there is a  question of whether she had pleurisy versus pericarditis a couple of years  ago when the pain was fairly intense.  At this time, she is relatively pain-  free and her heart rate has improved on admission; it had been tachycardic  in the 130s.   REVIEW OF SYSTEMS:  She has had some nausea.  She denies vomiting.  She has  had some fever, but it was subjective with hot and cold; she did not measure  the temperatures.  She denied any abdominal pain.  She did have some  diarrhea alternating with constipation.  All other review of systems are  negative.   PAST MEDICAL HISTORY:  1.  Chronic sinus infections.  2.  Pleurisy versus pericarditis.   PAST SURGICAL HISTORY:  None.   SOCIAL HISTORY:  She denies tobacco and ethanol.   She works as a Radio producer.  She has 1 daughter and was previously married, but is no  longer married.   FAMILY HISTORY:  Mom and dad are both living; mom is healthy; dad is living  with diabetes mellitus and CAD.   MEDICATIONS:  1.  PseudoMax, which contains pseudoephedrine and guaifenesin.  2.  Claritin 10 mg once daily.  3.  Hydrocodone to treat her root canal pain and a multivitamin.   PHYSICAL EXAMINATION:  VITAL SIGNS:  Temperature on admission was 100.9.  Blood pressure currently has been over 63; it was 116/66.  Pulse is 96-123.  Respirations 18-20.  SAT is 98.6%.  GENERAL:  She is in no acute distress.  HEENT:  She is normocephalic, atraumatic.  Pupils are equal, round and  reactive to light.  Extraocular muscles are intact.  Mucous membranes are  moist.  Her tympanic membranes are intact with serous bullae bilaterally.  She has mild nasal turbinate hyperemia.  She has tenderness over  the frontal  and facial sinuses.  Mucous membranes are moist.  She has tenderness over  the right lower ramus of her mandible.  CHEST:  Clear to auscultation.  There are no rhonchi, rales or wheezes.  CARDIOVASCULAR:  Regular rate and rhythm, positive S1 and S2, no S3 or S4,  no murmurs, rubs, or gallops.  ABDOMEN:  Soft, nontender and non-distended with positive bowel sounds.  EXTREMITIES:  No clubbing, cyanosis or edema.  NEUROLOGIC:  She is awake, alert and oriented, very articulate.  Cranial  nerves II-XII are intact.  Power is 5/5.  DTRs are 2+.  SKIN:  Intact without rashes.   LABORATORY DATA:  Urinalysis is positive for a large leukocyte esterase and  11-20 white cells.  Sodium is 134, potassium 4.1, chloride 106, CO2 is 22,  BUN is 8, creatinine is 1.2 and her glucose is 107.  Her D-dimer is 0.46.  Her pregnancy test is negative.  Point-of-care enzymes are negative.   Her chest x-ray is negative.   EKG shows 110 beats per minute with PVCs, left atrial enlargement,  probable  right atrial enlargement and there are diffuse ST-T wave changes that are of  unclear significance, since in the past she was in sinus bradycardia.   ASSESSMENT AND PLAN:  This is a 45 year old African American female who  presents with palpitations and fever, symptoms of sinusitis and dental pain.  These symptoms are associated with chest pain and an abnormal EKG.   1.  Cardiovascular -- chest pain and pressure:  She is currently on      pseudoephedrine x1 month.  There is a question of whether or not her      tachycardia may have been supraventricular tachycardia versus paroxysmal      atrial fibrillation secondary to the pseudoephedrine.  We will admit her      to Telemetry for further monitoring and check serial cardiac markers.  I      will not order a CT of the chest at this time; her D-dimer is not      excessively elevated.  Unless she becomes short of breath, I would      recommend holding off on CT of chest.  I will definitely hold the      PseudoMax and will not put her on any drugs that are cardiac-      stimulating.  We will check a 2-D echocardiogram.  2.  Pulmonary/Ear, Nose and Throat:  She has serous bulla suggesting      allergic rhinitis versus a sinus infection.  For now, we will continue      her Claritin, add a nasal saline, but hold all agents that might      stimulate her heart.  I will also cover her with Levaquin, which will      cover her sinus as well as a urinary tract infection.  3.  Dental pain:  We will use Percocet and check a Panorex.  4.  Genitourinary:  She has a urinary tract infection.  We will cover with      Levaquin and follow up the cultures.  5.  Endocrine:  We will check a TSH.      Melissa L. Ladona Ridgel, MD  Electronically Signed     MLT/MEDQ  D:  10/06/2005  T:  10/06/2005  Job:  [id number]

## 2010-09-25 NOTE — Assessment & Plan Note (Signed)
[facility]                              CARDIOLOGY OFFICE NOTE   NAME:MAXWELLAccalia, Rigdon                       MRN:          [id number]  DATE:11/19/2005                            DOB:          [birth year]    SUBJECTIVE:  Ms. Legner is a very pleasant 45 year old female patient, who  has been established with Dr. Gala Romney, who has a history of chest pain and  palpitations.  She underwent CT angiogram due to equivocal abnormalities on  her Myoview scan.  This revealed calcium score of 0, no significant coronary  disease and an EF of 59.2%.  She has also been placed on an event monitor  for her palpitations.  She went to the ER on July 11th with chest  discomfort.  She had point-of-care markers negative x2.  Dr. Myrtis Ser saw the  patient and reassured her, and she was sent home.  She called the office and  was set up for an appointment today.  She continues to have chest  discomfort.  This is a pressure sensation that is located in her lower chest  and upper epigastrium.  She notes associated shortness of breath with this  at times.  She notes this at rest and with exertion.  She also notes a dry  cough.  This has been worse in the past but seems to be getting better  recently.  She also notes symptoms of water brash in the morning sometimes.  She also denies any significant dyspepsia.  She denies any significant  changes with certain meals.  She denies any syncope or presyncope.  Denies  any orthopnea or paroxysmal nocturnal edema.   CURRENT MEDICATIONS:  1.  Multivitamin daily.  2.  Toprol XL 25 mg daily.   ALLERGIES:  No known drug allergies.   PHYSICAL EXAMINATION:  VITAL SIGNS:  Blood pressure 112/74, pulse 60, weight  186 pounds.  GENERAL:  She is a well-developed and well-nourished female in no distress.  HEENT:  Unremarkable.  NECK:  No JVD.  LUNGS:  Clear to auscultation bilaterally without wheezing, rhonchi or  rales.  CARDIAC:  Normal S1  and S2.  Regular rate and rhythm without murmurs.  No  rubs.  ABDOMEN:  Soft with normoactive bowel sounds.  No organomegaly.  Positive  epigastric tenderness with palpation.  EXTREMITIES:  Without edema.   Electrocardiogram reveals a sinus rhythm with a heart rate of 58, normal  axis, T wave inversions in lead III, V3 and V4.  No significant change from  previous tracings.   IMPRESSION:  1.  Chest pain.  2.  Palpitations.  3.  History of pleural pericarditis in 2002.  4.  History of thyroid goiter.      1.  TSH normal in the past.  Thyroid ultrasound pending.   PLAN:  I again reassured the patient today.  She has normal coronary  arteries by CT angiogram and good LV function.  We have not received any of  her tracings back yet from her event monitor.  I think at this point in time  a trial of  a proton pump inhibitor is warranted.  She does have some  symptoms that sound worrisome for gastroesophageal reflux disease.  She has  had a dry cough as well as some water brash symptoms.  She is also started  on some Motrin.  I think she can continue this for the possibility of  musculoskeletal chest pain.  She will start on Protonix 40 mg a day and  follow up with Dr. Gala Romney as directed in the next 2-3 weeks.                                  Tereso Newcomer, PA-C    SW/MedQ  DD:  11/19/2005  DT:  11/19/2005  Job #:  [id number]   cc:   Melissa L. Ladona Ridgel, MD

## 2010-09-25 NOTE — Consult Note (Signed)
NAME:  Katherine Gregory, Katherine Gregory NO.:  [id number]   MEDICAL RECORD NO.:  [id number]          PATIENT TYPE:  EMS   LOCATION:  MAJO                         FACILITY:  [facility]   PHYSICIAN:  Willa Rough, M.D.     DATE OF BIRTH:  [birth year]   DATE OF CONSULTATION:  DATE OF DISCHARGE:                                   CONSULTATION   Katherine Gregory is in the emergency room with chest pain.  She is a very  pleasant 45 year old female.  She had an episode of pleural pericarditis in  2002.  She has had some chest pain and palpitations.  She has had a long  history of sinus disease and has received Sudafed, and this may have been  playing a role in her palpitations.  She had been admitted to [facility],  and she had no significant arrhythmias.  She does have T wave inversions.  There was a question of an anterior perfusion defect on this study, and  plans were made for the patient to have a CT angiogram.  This study was done  on October 28, 2005.  It is completely normal, as read by Dr. Eden Emms.  The  calcium score is 0.  Coronaries are normal.  LV function is normal.   The patient had some mild chest discomfort and palpitations today and called  our office.  She was sent to the emergency room.  She is now stable.  Her  point-of-care enzymes are normal on two occasions.  She does have T wave  inversions.  These are similar to what she has had before, and in her case,  this is not diagnostic.   ALLERGIES:  No known drug allergies.   MEDICATIONS:  Aspirin, Mucinex, multivitamin, and a beta blocker.   SOCIAL HISTORY:  She works at [employer].  She is divorced with one  child.   FAMILY HISTORY:  She has no strong family history of coronary disease.   PHYSICAL EXAMINATION:  GENERAL:  Here in the emergency room, she is quite  stable.  VITAL SIGNS:  Her temperature is 98.4.  Pulse is normal.  HEENT:  No xanthelasma.  She has normal extraocular motion.  The patient is  oriented to  person, time, and place, and her affect is normal.  She appears  somewhat anxious about her symptoms.  NECK:  No bruits.  There is no jugular venous distention.  CARDIAC:  An S1 with an S2.  There are no clicks or significant murmurs.  ABDOMEN:  Soft.  There are no masses or bruits.  She has no significant  peripheral edema.   EKG reveals T wave inversions.  Point-of-care enzymes are normal x2.   IMPRESSION:  1.  History of some palpitations.  2.  History of pleural pericarditis in 2002.  3.  Recent palpitations.  The patient is to wear a monitor soon.  She is on      a beta blocker.  4.  History of a thyroid goiter.  There is a question of an ultrasound, and      I do not  know if it has been done at this time.  5.  Chest discomfort:  This pain is not due to coronary disease.  The      patient has a normal CT angiogram.  She has a 0 calcium score.  Wall      motion is normal.  Her coronaries are normal.   I have reassured the patient.  I have asked her to keep her appointment with  Dr. Gala Romney, to wear a monitor, and she will be discharged home from the  emergency room.           ______________________________  Willa Rough, M.D.     JK/MEDQ  D:  11/17/2005  T:  11/17/2005  Job:  [id number]   cc:   Arvilla Meres, M.D. [facility]  [facility]  [address]  [place]  [place] [address]

## 2010-09-25 NOTE — Discharge Summary (Signed)
[facility]  Patient:    Katherine Gregory, Katherine Gregory                         MRN: [id number] Adm. Date:  [id number] Disc. Date: [id number] Attending:  Colon Branch Dictator:   Brita Romp, P.A. CC:         Reuben Likes, M.D.   Discharge Summary  DISCHARGE DIAGNOSIS:  Chest pain, status post negative cardiac exam, suspect pleurisy.  HOSPITAL COURSE:  The patient presented to the emergency room on May 20, complaining of chest pain which is substernal in nature, positional and increased by deep inspiration.  She was seen and admitted by Dr. Charlton Haws. He noted that the patient had no significant cardiac risk factors and had a prior episode of pleurisy for approximately one year prior.  He noticed that the patients EKG revealed sinus bradycardia with T wave inversions in V1-V3. As a result, he felt that the patient needed cardiac workup.  The following day, the patient was seen by Dr. Dietrich Pates who noted that the patients cardiac enzymes were serially negative for MI.  She felt that given the patients history and presentation, that an ANA test should be added to the lab work.  Later that day, the patient underwent a transthoracic echocardiogram.  Left ventricular function was noted to be normal.  There was noted to be trivial mitral, as well as pulmonic valvular regurgitation.  There is also noted to be mild tricuspid valvular regurgitation.  The patient then underwent an exercise cardiology exam.  Nuclear imaging revealed ejection fraction of 58% with no perfusion defection.  A spiral CT of the chest revealed no evidence for pulmonary embolism.  The following day, the patient reported to have less pain.  In view of the results and the workup to this point and the patients clinical improvement, she was felt to be stable for discharge.  DISCHARGE MEDICATION:  Ibuprofen 800 mg q.8h. with food as needed.  SPECIAL INSTRUCTIONS:  The patient was  advised she may return to work on May 23.  DIET:  Low fat diet.  FOLLOWUP:  Follow up with Dr. Lorenz Coaster as needed or scheduled.  LABORATORY DATA AND X-RAY FINDINGS:  Sodium 143, potassium 4.7, chloride 111, BUN 5, creatinine 1.0, glucose 85.  Hemoglobin 14, hematocrit 41.0. Sedimentation rate was 16, ANA negative. DD:  10/20/00 TD:  10/21/00 Job: [id number]

## 2010-09-25 NOTE — Letter (Signed)
November 17, 2005    Bevelyn Buckles. Bensimhon, MD  [address]  [place], [place] [address]   RE:  [name], [name]  MRN #[id number]  /  DOB:  April 07, 1966   Dear Dr. Gala Romney,   I saw Katherine Gregory in the emergency room on November 13, 2005.  She had called  the office and was sent by the office staff to the emergency room.  She is  quite stable here.  I was able to obtain the CT angiogram results dated October 28, 2005.  This calcium score was 0.  Coronaries were normal.  LV function  was normal.   In the emergency room, the patient's point-of-care enzymes are negative.  I  feel that she is quite stable.  I have tried to reassure her and tell her  that if she does have some chest discomfort, that she should try to be  reassured and that it is okay to use a nonsteroidal medication for some  pain.  She does have an appointment to see Dr. Gala Romney in followup and to  wear a Holter.   I have dictated a full note into the [facility] system as an emergency room  consult note.    Sincerely,      Luis Abed, MD, [facility]   JDK/MedQ  DD:  11/17/2005  DT:  11/17/2005  Job #:  [id number]

## 2010-09-25 NOTE — Assessment & Plan Note (Signed)
[facility]                              CARDIOLOGY OFFICE NOTE   NAME:Gregory, Katherine                       MRN:          [id number]  DATE:12/07/2005                            DOB:          1965-09-08    PATIENT IDENTIFICATION:  [name] is a 44 year old woman who returns for  routine followup.   PROBLEM LIST:  1.  Palpitations and chest pain.      1.  Exercise stress test showed positive EKG changes with question of          mild anterior ischemia.  EF 60%.      2.  Cardiac CT showed an EF of 59% with __________  score of 0 and no          coronary artery disease.  2.  History of a pleural pericarditis.   CURRENT MEDICATIONS:  1.  Multivitamin.  2.  Toprol XL 25.  3.  Protonix 40.   NO KNOWN DRUG ALLERGIES.   INTERVAL HISTORY:  [name] returns today for routine followup.  She says  her chest pain and palpitations have gotten much better with the addition of  Toprol and Protonix.  She did have a monitor which showed occasional PACs.  She says she is back to baseline without any chest pain or shortness of  breath.   PHYSICAL EXAMINATION:  GENERAL:  Well-appearing, no acute distress.  VITAL SIGNS:  Blood pressure is 116/70, heart rate is 68.  Her weight is  186.  HEENT:  Sclerae anicteric.  EOMI.  There is xanthelasma.  Mucous membranes  are moist.  NECK:  Supple.  No JVD.  Carotids 2+ bilaterally without any bruits.  There  is no lymphadenopathy or thyromegaly.  CARDIAC:  Shows a regular rate and rhythm with mild ectopy.  No murmurs,  rubs, or gallops.  LUNGS:  Clear.  ABDOMEN:  Obese, nontender, nondistended.  There is no hepatosplenomegaly.  No bruits.  No masses.  Good bowel sounds.  EXTREMITIES:  Warm with no cyanosis, clubbing, or edema.  NEUROLOGIC:  She is alert and oriented x3.  Cranial nerves II-XII are  intact.  Moves all four extremities without difficulty.   ASSESSMENT/PLAN:  Palpitations and chest discomfort.  Her  cardiac CT showed  no evidence of coronary artery disease.  I suspect these are just related to  premature atrial contractions and possible reflux disease.  She is doing  quite well on her current regimen.  I told  her she could titrate up her beta-blocker as needed.  Otherwise we will see  her back on a p.r.n. basis.  I also reminded her to reduce her caffeine  intake if at all possible.                                Bevelyn Buckles. Bensimhon, MD    DRB/MedQ  DD:  12/07/2005  DT:  12/07/2005  Job #:  [id number]

## 2010-12-30 ENCOUNTER — Inpatient Hospital Stay (INDEPENDENT_AMBULATORY_CARE_PROVIDER_SITE_OTHER)
Admission: RE | Admit: 2010-12-30 | Discharge: 2010-12-30 | Disposition: A | Discharge status: Home / Self Care | Payer: Self-pay | Source: Ambulatory Visit | Attending: Emergency Medicine | Admitting: Emergency Medicine

## 2010-12-30 DIAGNOSIS — J019 Acute sinusitis, unspecified: Secondary | ICD-10-CM

## 2011-01-28 LAB — I-STAT 8, (EC8 V) (CONVERTED LAB)
Acid-base deficit: 2
BUN: 9
Bicarbonate: 23.3
Chloride: 107
Glucose, Bld: 83
HCT: 44
Hemoglobin: 15
Operator id: 288831
Potassium: 3.8
Sodium: 138
TCO2: 25
pCO2, Ven: 41.1 — ABNORMAL LOW
pH, Ven: 7.361 — ABNORMAL HIGH

## 2011-01-28 LAB — POCT CARDIAC MARKERS
CKMB, poc: <1 — ABNORMAL LOW
Myoglobin, poc: 47.2
Operator id: 288831
Troponin i, poc: <0.05

## 2011-01-28 LAB — POCT I-STAT CREATININE
Creatinine, Ser: 1.1
Operator id: 288831

## 2011-01-28 LAB — SEDIMENTATION RATE: Sed Rate: 7

## 2011-01-28 LAB — D-DIMER, QUANTITATIVE: D-Dimer, Quant: 0.48

## 2011-02-01 LAB — POCT RAPID STREP A: Streptococcus, Group A Screen (Direct): NEGATIVE

## 2011-02-09 LAB — POCT I-STAT, CHEM 8
BUN: 7
Calcium, Ion: 1.16
Chloride: 110
Creatinine, Ser: 1.1
Glucose, Bld: 79
HCT: 45
Hemoglobin: 15.3 — ABNORMAL HIGH
Potassium: 4.1
Sodium: 141
TCO2: 23

## 2011-02-09 LAB — POCT CARDIAC MARKERS
CKMB, poc: <1 — ABNORMAL LOW
Myoglobin, poc: 43
Troponin i, poc: <0.05

## 2011-02-12 LAB — URINE MICROSCOPIC-ADD ON

## 2011-02-12 LAB — URINALYSIS, ROUTINE W REFLEX MICROSCOPIC
Bilirubin Urine: NEGATIVE
Glucose, UA: NEGATIVE
Hgb urine dipstick: NEGATIVE
Ketones, ur: NEGATIVE
Nitrite: POSITIVE — AB
Protein, ur: NEGATIVE
Specific Gravity, Urine: 1.021
Urobilinogen, UA: 1
pH: 7

## 2011-02-12 LAB — DIFFERENTIAL
Basophils Absolute: 0.1
Basophils Relative: 1
Eosinophils Absolute: 0.1
Eosinophils Relative: 2
Lymphocytes Relative: 40
Lymphs Abs: 2.8
Monocytes Absolute: 0.6
Monocytes Relative: 8
Neutro Abs: 3.5
Neutrophils Relative %: 50

## 2011-02-12 LAB — URINE CULTURE: Colony Count: >=100000

## 2011-02-12 LAB — I-STAT 8, (EC8 V) (CONVERTED LAB)
Acid-base deficit: 1
BUN: 11
Bicarbonate: 26 — ABNORMAL HIGH
Chloride: 110
Glucose, Bld: 89
HCT: 42
Hemoglobin: 14.3
Operator id: 288331
Potassium: 4.4
Sodium: 139
TCO2: 28
pCO2, Ven: 50.7 — ABNORMAL HIGH
pH, Ven: 7.319 — ABNORMAL HIGH

## 2011-02-12 LAB — POCT I-STAT CREATININE
Creatinine, Ser: 1.1
Operator id: 288331

## 2011-02-12 LAB — POCT PREGNANCY, URINE
Operator id: 28833
Preg Test, Ur: NEGATIVE

## 2011-02-12 LAB — CBC
HCT: 38.5
Hemoglobin: 13.3
MCHC: 34.5
MCV: 88.2
Platelets: 346
RBC: 4.37
RDW: 12.5
WBC: 7

## 2011-02-12 LAB — POCT CARDIAC MARKERS
CKMB, poc: <1 — ABNORMAL LOW
Myoglobin, poc: 41.3
Operator id: 288331
Troponin i, poc: <0.05

## 2011-02-12 LAB — D-DIMER, QUANTITATIVE: D-Dimer, Quant: 0.5 — ABNORMAL HIGH

## 2011-02-23 LAB — CBC
HCT: 37.2
HCT: 38.5
Hemoglobin: 12.8
Hemoglobin: 13.1
MCHC: 34
MCHC: 34.4
MCV: 91.8
MCV: 92.8
Platelets: 223
Platelets: 271
RBC: 4.06
RBC: 4.15
RDW: 13.1
RDW: 13.2
WBC: 6.4
WBC: 9.6

## 2011-02-23 LAB — URINALYSIS, ROUTINE W REFLEX MICROSCOPIC
Bilirubin Urine: NEGATIVE
Glucose, UA: NEGATIVE
Hgb urine dipstick: NEGATIVE
Ketones, ur: NEGATIVE
Nitrite: NEGATIVE
Protein, ur: NEGATIVE
Specific Gravity, Urine: 1.02
Urobilinogen, UA: 0.2
pH: 6.5

## 2011-02-23 LAB — RPR: RPR Ser Ql: NONREACTIVE

## 2011-02-23 LAB — CCBB MATERNAL DONOR DRAW

## 2011-04-03 ENCOUNTER — Encounter (HOSPITAL_COMMUNITY): Payer: Self-pay | Admitting: Obstetrics and Gynecology

## 2011-04-03 ENCOUNTER — Inpatient Hospital Stay (HOSPITAL_COMMUNITY): Payer: Medicaid Other

## 2011-04-03 ENCOUNTER — Inpatient Hospital Stay (HOSPITAL_COMMUNITY)
Admission: AD | Admit: 2011-04-03 | Discharge: 2011-04-03 | Disposition: A | Discharge status: Home / Self Care | Payer: Medicaid Other | Source: Ambulatory Visit | Attending: Obstetrics & Gynecology | Admitting: Obstetrics & Gynecology

## 2011-04-03 DIAGNOSIS — N760 Acute vaginitis: Secondary | ICD-10-CM | POA: Insufficient documentation

## 2011-04-03 DIAGNOSIS — R109 Unspecified abdominal pain: Secondary | ICD-10-CM | POA: Insufficient documentation

## 2011-04-03 DIAGNOSIS — B9689 Other specified bacterial agents as the cause of diseases classified elsewhere: Secondary | ICD-10-CM | POA: Insufficient documentation

## 2011-04-03 DIAGNOSIS — A499 Bacterial infection, unspecified: Secondary | ICD-10-CM | POA: Insufficient documentation

## 2011-04-03 DIAGNOSIS — N390 Urinary tract infection, site not specified: Secondary | ICD-10-CM

## 2011-04-03 LAB — URINALYSIS, ROUTINE W REFLEX MICROSCOPIC
Bilirubin Urine: NEGATIVE
Glucose, UA: NEGATIVE mg/dL
Hgb urine dipstick: NEGATIVE
Ketones, ur: 15 mg/dL — AB
Nitrite: POSITIVE — AB
Protein, ur: NEGATIVE mg/dL
Specific Gravity, Urine: 1.02 (ref 1.005–1.030)
Urobilinogen, UA: 0.2 mg/dL (ref 0.0–1.0)
pH: 6 (ref 5.0–8.0)

## 2011-04-03 LAB — WET PREP, GENITAL
Trich, Wet Prep: NONE SEEN
Yeast Wet Prep HPF POC: NONE SEEN

## 2011-04-03 LAB — URINE MICROSCOPIC-ADD ON

## 2011-04-03 LAB — POCT PREGNANCY, URINE: Preg Test, Ur: NEGATIVE

## 2011-04-03 MED ORDER — METRONIDAZOLE 500 MG PO TABS: 500.0000 mg | ORAL_TABLET | Freq: Two times a day (BID) | ORAL | Status: AC

## 2011-04-03 MED ORDER — HYDROMORPHONE HCL PF 1 MG/ML IJ SOLN
1.0000 mg | Freq: Once | INTRAMUSCULAR | Status: AC
  Administered 2011-04-03: 1 mg via INTRAMUSCULAR
  Filled 2011-04-03: qty 1

## 2011-04-03 MED ORDER — SULFAMETHOXAZOLE-TRIMETHOPRIM 800-160 MG PO TABS: 1.0000 | ORAL_TABLET | Freq: Two times a day (BID) | ORAL | Status: AC

## 2011-04-03 NOTE — ED Provider Notes (Signed)
History     Chief Complaint  Patient presents with  . Abdominal Pain  . Vaginal Discharge   HPI  Lower abd pain started yesterday. Intermittent in nature, rated a 4/10.  Similar pain last month; irregular vaginal bleeding, with uncertain LMP; hx of tubal ligation.  Noticed clear/white d/c with +streaks of blood; also has odor to d/c   Past Medical History  Diagnosis Date  . No pertinent past medical history     Past Surgical History  Procedure Date  . Cesarean section 1988 & 2008  . Wisdom tooth extraction   . Cartilage surgery     RT wrist    Family History  Problem Relation Age of Onset  . Hypertension Mother   . Huntington's disease Mother   . Diabetes Father   . Hypertension Father   . Scoliosis Sister   . Diabetes Brother   . Hypertension Brother   . Scoliosis Brother     History  Substance Use Topics  . Smoking status: Never Smoker   . Smokeless tobacco: Not on file  . Alcohol Use: 0.6 oz/week    1 Glasses of wine per week     occassional    Allergies:  Allergies  Allergen Reactions  . Ibuprofen     "It intensifies my pain."  . Morphine And Related Itching and Other (See Comments)    Reaction burns  . Penicillins Hives and Itching  . Pseudoephedrine Other (See Comments)    Speed up heart rate    Prescriptions prior to admission  Medication Sig Dispense Refill  . guaiFENesin (MUCINEX) 600 MG 12 hr tablet Take 600 mg by mouth daily.          Review of Systems  Gastrointestinal: Positive for abdominal pain.  Genitourinary:       White, odorous vaginal discharge  All other systems reviewed and are negative.   Physical Exam   Blood pressure 127/64, pulse 77, temperature 98.5 F (36.9 C), temperature source Oral, resp. rate 20, height 5\' 10"  (1.778 m), weight 89.994 kg (198 lb 6.4 oz), last menstrual period 03/19/2011.  Physical Exam  Constitutional: She is oriented to person, place, and time. She appears well-developed and well-nourished.    HENT:  Head: Normocephalic.  Neck: Normal range of motion. Neck supple.  GI: Soft. She exhibits no mass. There is no tenderness. There is no guarding.  Genitourinary: Uterus is tender. Vaginal discharge (white, creamy) found.       +fishy odor; difficult to palpate uterus secondary to size  Neurological: She is alert and oriented to person, place, and time. She has normal reflexes.  Skin: Skin is warm and dry.    MAU Course  Procedures  Wet prep - mod clue, neg trich, neg yeast UA - 15 Ketones, pos Nitrites, Tr Leuk Ultrasound - Normal study. No evidence of pelvic mass or other significant  abnormality. Dilaudid IM  Assessment and Plan  Urinary Tract Infection Bacterial Vaginosis  Plan: Bactrim DS x 3 days Flagyl Urine culture Follow-up as needed  [facility] 04/03/2011, 5:40 AM

## 2011-04-03 NOTE — Progress Notes (Signed)
G4P2 SAB1 TAB1 Has had lower abd pain since yesterday. Intermittent. Had same pain a month ago which subsided on own. Didn't want current pain to get to level of abd pain present a month ago. Very irreg vaginal bleeding and unsure of LMP. Clear/white d/c and sometimes has blood in it. Odor to d/c

## 2011-04-05 LAB — URINE CULTURE
Colony Count: >=100000
Culture  Setup Time: 201211241738

## 2011-04-05 LAB — GC/CHLAMYDIA PROBE AMP, GENITAL
Chlamydia, DNA Probe: NEGATIVE
GC Probe Amp, Genital: NEGATIVE

## 2011-04-06 ENCOUNTER — Encounter (HOSPITAL_COMMUNITY): Payer: Self-pay | Admitting: *Deleted

## 2011-04-06 ENCOUNTER — Inpatient Hospital Stay (HOSPITAL_COMMUNITY)
Admission: AD | Admit: 2011-04-06 | Discharge: 2011-04-06 | Disposition: A | Discharge status: Home / Self Care | Payer: Medicaid Other | Source: Ambulatory Visit | Attending: Family Medicine | Admitting: Family Medicine

## 2011-04-06 DIAGNOSIS — R109 Unspecified abdominal pain: Secondary | ICD-10-CM | POA: Insufficient documentation

## 2011-04-06 DIAGNOSIS — N72 Inflammatory disease of cervix uteri: Secondary | ICD-10-CM

## 2011-04-06 DIAGNOSIS — B9689 Other specified bacterial agents as the cause of diseases classified elsewhere: Secondary | ICD-10-CM | POA: Insufficient documentation

## 2011-04-06 DIAGNOSIS — A499 Bacterial infection, unspecified: Secondary | ICD-10-CM | POA: Insufficient documentation

## 2011-04-06 DIAGNOSIS — N760 Acute vaginitis: Secondary | ICD-10-CM | POA: Insufficient documentation

## 2011-04-06 DIAGNOSIS — N390 Urinary tract infection, site not specified: Secondary | ICD-10-CM | POA: Insufficient documentation

## 2011-04-06 LAB — CBC
HCT: 40 % (ref 36.0–46.0)
Hemoglobin: 13.4 g/dL (ref 12.0–15.0)
MCH: 30.4 pg (ref 26.0–34.0)
MCHC: 33.5 g/dL (ref 30.0–36.0)
MCV: 90.7 fL (ref 78.0–100.0)
Platelets: 276 10*3/uL (ref 150–400)
RBC: 4.41 MIL/uL (ref 3.87–5.11)
RDW: 12.6 % (ref 11.5–15.5)
WBC: 4.9 10*3/uL (ref 4.0–10.5)

## 2011-04-06 LAB — URINALYSIS, ROUTINE W REFLEX MICROSCOPIC
Bilirubin Urine: NEGATIVE
Glucose, UA: NEGATIVE mg/dL
Hgb urine dipstick: NEGATIVE
Ketones, ur: NEGATIVE mg/dL
Leukocytes, UA: NEGATIVE
Nitrite: NEGATIVE
Protein, ur: NEGATIVE mg/dL
Specific Gravity, Urine: 1.025 (ref 1.005–1.030)
Urobilinogen, UA: 0.2 mg/dL (ref 0.0–1.0)
pH: 6.5 (ref 5.0–8.0)

## 2011-04-06 LAB — WET PREP, GENITAL
Clue Cells Wet Prep HPF POC: NONE SEEN
Trich, Wet Prep: NONE SEEN
Yeast Wet Prep HPF POC: NONE SEEN

## 2011-04-06 MED ORDER — OXYCODONE-ACETAMINOPHEN 5-325 MG PO TABS: 2.0000 | ORAL_TABLET | ORAL | Status: DC | PRN

## 2011-04-06 MED ORDER — AZITHROMYCIN 1 G PO PACK
1.0000 g | PACK | Freq: Once | ORAL | Status: AC
  Administered 2011-04-06: 1 g via ORAL
  Filled 2011-04-06: qty 1

## 2011-04-06 MED ORDER — OXYCODONE-ACETAMINOPHEN 5-325 MG PO TABS
1.0000 | ORAL_TABLET | Freq: Once | ORAL | Status: AC
  Administered 2011-04-06: 1 via ORAL
  Filled 2011-04-06: qty 1

## 2011-04-06 MED ORDER — CEFTRIAXONE SODIUM 250 MG IJ SOLR
250.0000 mg | Freq: Once | INTRAMUSCULAR | Status: AC
  Administered 2011-04-06: 250 mg via INTRAMUSCULAR
  Filled 2011-04-06: qty 250

## 2011-04-06 NOTE — Progress Notes (Signed)
Pt states she was here at [facility] over weekend. DX with a UTI and BV. Still having intermittent sharp lower abdominal pain. Not taking anything for pain.

## 2011-04-10 ENCOUNTER — Inpatient Hospital Stay (HOSPITAL_COMMUNITY)
Admission: AD | Admit: 2011-04-10 | Discharge: 2011-04-10 | Disposition: A | Discharge status: Home / Self Care | Payer: Self-pay | Source: Ambulatory Visit | Attending: Obstetrics and Gynecology | Admitting: Obstetrics and Gynecology

## 2011-04-10 ENCOUNTER — Encounter (HOSPITAL_COMMUNITY): Payer: Self-pay | Admitting: *Deleted

## 2011-04-10 DIAGNOSIS — R109 Unspecified abdominal pain: Secondary | ICD-10-CM | POA: Insufficient documentation

## 2011-04-10 DIAGNOSIS — N949 Unspecified condition associated with female genital organs and menstrual cycle: Secondary | ICD-10-CM | POA: Insufficient documentation

## 2011-04-10 LAB — CBC
HCT: 41 % (ref 36.0–46.0)
Hemoglobin: 13.7 g/dL (ref 12.0–15.0)
MCH: 30.5 pg (ref 26.0–34.0)
MCHC: 33.4 g/dL (ref 30.0–36.0)
MCV: 91.3 fL (ref 78.0–100.0)
Platelets: 280 10*3/uL (ref 150–400)
RBC: 4.49 MIL/uL (ref 3.87–5.11)
RDW: 12.6 % (ref 11.5–15.5)
WBC: 6.4 10*3/uL (ref 4.0–10.5)

## 2011-04-10 MED ORDER — PHENAZOPYRIDINE HCL 100 MG PO TABS
200.0000 mg | ORAL_TABLET | Freq: Once | ORAL | Status: AC
  Administered 2011-04-10: 200 mg via ORAL
  Filled 2011-04-10 (×2): qty 1

## 2011-04-10 MED ORDER — PHENAZOPYRIDINE HCL 200 MG PO TABS: 200.0000 mg | ORAL_TABLET | Freq: Once | ORAL | Status: AC

## 2011-04-10 MED ORDER — OXYCODONE-ACETAMINOPHEN 5-325 MG PO TABS
2.0000 | ORAL_TABLET | Freq: Once | ORAL | Status: AC
  Administered 2011-04-10: 2 via ORAL
  Filled 2011-04-10: qty 2

## 2011-04-10 NOTE — Progress Notes (Signed)
Patient states she was seen in [facility]U a few days ago and diagnosed with PID and given a antibiotic to drink and a shot and some percocet. States she was told she would feel better in a couple of days and she is still having slight pain.

## 2011-04-10 NOTE — ED Provider Notes (Signed)
Chief Complaint:  Abdominal Pain   Katherine Gregory is  45 y.o. [phone].  Patient's last menstrual period was 03/19/2011.[name]  Her pregnancy status is negative.  She presents complaining of Abdominal Pain  Multiple visits to [facility]U for pelvic pain. Was seen on 11/24 & 11/27 and dx with BV and UTI. Reports completing course of Flagyl and Septra yesterday and pain continues to be present. States given rx for percocet but is not taking. Denies fever, chills, N/V/D, or back pain. Describes pain as low pelvic and BL groin, sharp & cramping.  Obstetrical/Gynecological History: OB History    Grav Para Term Preterm Abortions TAB SAB Ect Mult Living   4 2 2  2 1 1   2       Past Medical History: Past Medical History  Diagnosis Date  . No pertinent past medical history     Past Surgical History: Past Surgical History  Procedure Date  . Cesarean section 1988 & 2008  . Wisdom tooth extraction   . Cartilage surgery     RT wrist    Family History: Family History  Problem Relation Age of Onset  . Hypertension Mother   . Huntington's disease Mother   . Diabetes Father   . Hypertension Father   . Scoliosis Sister   . Diabetes Brother   . Hypertension Brother   . Scoliosis Brother     Social History: History  Substance Use Topics  . Smoking status: Never Smoker   . Smokeless tobacco: Not on file  . Alcohol Use: 0.6 oz/week    1 Glasses of wine per week     occassional    Allergies:  Allergies  Allergen Reactions  . Ibuprofen     "It intensifies my pain."  . Morphine And Related Itching and Other (See Comments)    Reaction burns  . Penicillins Hives and Itching  . Pseudoephedrine Other (See Comments)    Speed up heart rate    Prescriptions prior to admission  Medication Sig Dispense Refill  . guaiFENesin (MUCINEX) 600 MG 12 hr tablet Take 600 mg by mouth daily.        . metroNIDAZOLE (FLAGYL) 500 MG tablet Take 1 tablet (500 mg total) by mouth 2 (two) times daily.  14 tablet   0  . oxyCODONE-acetaminophen (PERCOCET) 5-325 MG per tablet Take 2 tablets by mouth every 4 (four) hours as needed. pain       . sulfamethoxazole-trimethoprim (SEPTRA DS) 800-160 MG per tablet Take 1 tablet by mouth 2 (two) times daily.  6 tablet  0  . DISCONTD: oxyCODONE-acetaminophen (PERCOCET) 5-325 MG per tablet Take 2 tablets by mouth every 4 (four) hours as needed for pain.  15 tablet  0    Review of Systems - Negative except what has been reviewed in HPI  Physical Exam   Blood pressure 111/68, pulse 80, temperature 99.2 F (37.3 C), temperature source Oral, resp. rate 16, height 5\' 8"  (1.727 m), weight 86.728 kg (191 lb 3.2 oz), last menstrual period 03/19/2011, SpO2 99.00%.  General: General appearance - alert, well appearing, and in no distress, oriented to person, place, and time and overweight Mental status - alert, oriented to person, place, and time, normal mood, behavior, speech, dress, motor activity, and thought processes, affect appropriate to mood, comfortable appearing Abdomen - soft, non distended, no guarding, no rebound, diffuse mild tenderness, suprapubic tenderness Focused Gynecological Exam: normal external genitalia, vulva, vagina, cervix, uterus and adnexa, No CMT  Labs: Recent Results (  from the past 24 hour(s))  CBC   Collection Time   04/10/11  3:34 PM      Component Value Range   WBC 6.4  4.0 - 10.5 (K/uL)   RBC 4.49  3.87 - 5.11 (MIL/uL)   Hemoglobin 13.7  12.0 - 15.0 (g/dL)   HCT 16.1  09.6 - 04.5 (%)   MCV 91.3  78.0 - 100.0 (fL)   MCH 30.5  26.0 - 34.0 (pg)   MCHC 33.4  30.0 - 36.0 (g/dL)   RDW 40.9  81.1 - 91.4 (%)   Platelets 280  150 - 400 (K/uL)   Imaging Studies:  US Pelvis Complete  04/03/2011  *RADIOLOGY REPORT*  Clinical Data: Low abdominal pain, vaginal bleeding  TRANSABDOMINAL AND TRANSVAGINAL ULTRASOUND OF PELVIS Technique:  Both transabdominal and transvaginal ultrasound examinations of the pelvis were performed. Transabdominal  technique was performed for global imaging of the pelvis including uterus, ovaries, adnexal regions, and pelvic cul-de-sac.  Comparison: CT 08/23/2010 and earlier studies   It was necessary to proceed with endovaginal exam following the transabdominal exam to visualize the endometrium.  Findings:  Uterus: Normal in size and appearance, 4.3 x 4.4 x 7.9 cm.  Endometrium: Normal in thickness and appearance, 14 mm in thickness  Right ovary:  21 x 23 x 40 mm, unremarkable  Left ovary: 14 x 15 x 29 mm, unremarkable  Other findings: No free fluid  IMPRESSION: Normal study. No evidence of pelvic mass or other significant abnormality.  Original Report Authenticated By: Osa Craver, M.D.     Assessment: Pelvic Pain ? Bladder Spasms Patient Active Problem List  Diagnoses  . GOITER  . SINUSITIS, RECURRENT  . ALLERGIC RHINITIS  . GERD  . URTICARIA  . FATIGUE  . PALPITATIONS  . DYSURIA  . FLANK PAIN, RIGHT  . HEADACHES, HX OF    Plan: Discharge home Continue pain meds Rx pyridium FU in Gyn Clinic or PCP  [name] 04/10/2011,3:58 PM

## 2011-04-11 LAB — URINE CULTURE
Colony Count: 15000
Culture  Setup Time: 201212012012

## 2011-04-19 NOTE — ED Provider Notes (Signed)
Attestation of Attending Supervision of Advanced Practitioner: Evaluation and management procedures were performed by the PA/NP/CNM/OB Fellow under my supervision/collaboration. Chart reviewed and agree with management and plan.  Oakley Kossman V 04/19/2011 8:04 AM    

## 2011-07-15 ENCOUNTER — Encounter (HOSPITAL_COMMUNITY): Payer: Self-pay | Admitting: Emergency Medicine

## 2011-07-15 ENCOUNTER — Emergency Department (INDEPENDENT_AMBULATORY_CARE_PROVIDER_SITE_OTHER)
Admission: EM | Admit: 2011-07-15 | Discharge: 2011-07-15 | Disposition: A | Discharge status: Home Health | Payer: Self-pay | Source: Home / Self Care

## 2011-07-15 DIAGNOSIS — J069 Acute upper respiratory infection, unspecified: Secondary | ICD-10-CM

## 2011-07-15 LAB — POCT RAPID STREP A: Streptococcus, Group A Screen (Direct): NEGATIVE

## 2011-07-15 MED ORDER — CEPHALEXIN 500 MG PO CAPS: 500.0000 mg | ORAL_CAPSULE | Freq: Three times a day (TID) | ORAL | Status: AC

## 2011-07-15 NOTE — ED Notes (Signed)
PT HEREWITH SINUS SX SORE THROAT,RADIATING TO BILAT EARS AND PAIN WITH SWALLOWING,FEVERS,CHILLS AND BODY ACHES.SX STARTED X 2WEEKS AGO UNRELIEVED BY OTC MUCINEX OR ASPIRIN.SINUS PRESSURE BUT DENIES CP OR SOB

## 2011-07-15 NOTE — ED Provider Notes (Signed)
History     CSN: [id number]  Arrival date & time 07/15/11  1440   None     Chief Complaint  Patient presents with  . Sinusitis  . URI    (Consider location/radiation/quality/duration/timing/severity/associated sxs/prior treatment) HPI Comments: Patient presents with complaints of nasal congestion, postnasal drainage, sore throat and chills that began over a week ago. Her voice is hoarse from the congestion and drainage. No chest congestion, cough or dyspnea. She has been taking Mucinex and over-the-counter pain relievers as needed without improvement.   Past Medical History  Diagnosis Date  . No pertinent past medical history     Past Surgical History  Procedure Date  . Cesarean section 1988 & 2008  . Wisdom tooth extraction   . Cartilage surgery     RT wrist    Family History  Problem Relation Age of Onset  . Hypertension Mother   . Huntington's disease Mother   . Diabetes Father   . Hypertension Father   . Scoliosis Sister   . Diabetes Brother   . Hypertension Brother   . Scoliosis Brother     History  Substance Use Topics  . Smoking status: Never Smoker   . Smokeless tobacco: Not on file  . Alcohol Use: 0.6 oz/week    1 Glasses of wine per week     occassional    OB History    Grav Para Term Preterm Abortions TAB SAB Ect Mult Living   4 2 2  2 1 1   2       Review of Systems  Constitutional: Negative for fever, chills and fatigue.  HENT: Positive for congestion, sore throat and postnasal drip. Negative for ear pain, rhinorrhea, sneezing and sinus pressure.   Respiratory: Negative for cough, shortness of breath and wheezing.   Cardiovascular: Negative for chest pain.    Allergies  Ibuprofen; Morphine and related; Penicillins; and Pseudoephedrine  Home Medications   Current Outpatient Rx  Name Route Sig Dispense Refill  . CEPHALEXIN 500 MG PO CAPS Oral Take 1 capsule (500 mg total) by mouth 3 (three) times daily. 30 capsule 0  . GUAIFENESIN ER  600 MG PO TB12 Oral Take 600 mg by mouth daily.        BP 126/84  Pulse 76  Temp(Src) 98.5 F (36.9 C) (Oral)  Resp 16  SpO2 100%  LMP 07/14/2011  Physical Exam  Constitutional: She appears well-developed and well-nourished. No distress.  HENT:  Head: Normocephalic and atraumatic.  Right Ear: Tympanic membrane, external ear and ear canal normal.  Left Ear: Tympanic membrane, external ear and ear canal normal.  Nose: Nose normal. Right sinus exhibits no maxillary sinus tenderness. Left sinus exhibits no maxillary sinus tenderness.  Mouth/Throat: Uvula is midline, oropharynx is clear and moist and mucous membranes are normal. No oropharyngeal exudate, posterior oropharyngeal edema or posterior oropharyngeal erythema.       hoarse voice noted  Neck: Neck supple.  Cardiovascular: Normal rate, regular rhythm and normal heart sounds.   Pulmonary/Chest: Effort normal and breath sounds normal. No respiratory distress.  Lymphadenopathy:    She has no cervical adenopathy.  Neurological: She is alert.  Skin: Skin is warm and dry.  Psychiatric: She has a normal mood and affect.    ED Course  Procedures (including critical care time)   Labs Reviewed  POCT RAPID STREP A ([facility] CARE ONLY)   No results found.   1. Acute URI       MDM  URI symptoms for 1-2 weeks, no improvement.         Melody Comas, [place] 07/15/11 1727

## 2011-07-15 NOTE — Discharge Instructions (Signed)
Tylenol as needed for discomfort or fever. Take an over the counter antihistamine such as the generic of Claritin or Zyrtec to help with your congestion and drainage. Increase fluids and rest.

## 2011-07-16 NOTE — ED Provider Notes (Signed)
Medical screening examination/treatment/procedure(s) were performed by non-physician practitioner and as supervising physician I was immediately available for consultation/collaboration.  Luiz Blare MD   Luiz Blare, MD 07/16/11 [phone]

## 2011-08-28 ENCOUNTER — Encounter (HOSPITAL_COMMUNITY): Payer: Self-pay | Admitting: Family Medicine

## 2011-08-28 ENCOUNTER — Emergency Department (HOSPITAL_COMMUNITY)
Admission: EM | Admit: 2011-08-28 | Discharge: 2011-08-29 | Disposition: A | Discharge status: Home / Self Care | Payer: Self-pay | Attending: Emergency Medicine | Admitting: Emergency Medicine

## 2011-08-28 ENCOUNTER — Emergency Department (HOSPITAL_COMMUNITY): Payer: Self-pay

## 2011-08-28 DIAGNOSIS — R079 Chest pain, unspecified: Secondary | ICD-10-CM | POA: Insufficient documentation

## 2011-08-28 DIAGNOSIS — K219 Gastro-esophageal reflux disease without esophagitis: Secondary | ICD-10-CM | POA: Insufficient documentation

## 2011-08-28 DIAGNOSIS — R0602 Shortness of breath: Secondary | ICD-10-CM | POA: Insufficient documentation

## 2011-08-28 HISTORY — DX: Gastro-esophageal reflux disease without esophagitis: K21.9

## 2011-08-28 LAB — COMPREHENSIVE METABOLIC PANEL
ALT: 10 U/L (ref 0–35)
AST: 11 U/L (ref 0–37)
Albumin: 3.6 g/dL (ref 3.5–5.2)
Alkaline Phosphatase: 79 U/L (ref 39–117)
BUN: 12 mg/dL (ref 6–23)
CO2: 22 mEq/L (ref 19–32)
Calcium: 9.4 mg/dL (ref 8.4–10.5)
Chloride: 106 mEq/L (ref 96–112)
Creatinine, Ser: 0.94 mg/dL (ref 0.50–1.10)
GFR calc Af Amer: 84 mL/min — ABNORMAL LOW (ref >90)
GFR calc non Af Amer: 73 mL/min — ABNORMAL LOW (ref >90)
Glucose, Bld: 74 mg/dL (ref 70–99)
Potassium: 4.2 mEq/L (ref 3.5–5.1)
Sodium: 137 mEq/L (ref 135–145)
Total Bilirubin: 0.3 mg/dL (ref 0.3–1.2)
Total Protein: 7.3 g/dL (ref 6.0–8.3)

## 2011-08-28 LAB — POCT I-STAT TROPONIN I
Troponin i, poc: 0 ng/mL (ref 0.00–0.08)
Troponin i, poc: 0.01 ng/mL (ref 0.00–0.08)

## 2011-08-28 LAB — URINE MICROSCOPIC-ADD ON

## 2011-08-28 LAB — DIFFERENTIAL
Basophils Absolute: 0 10*3/uL (ref 0.0–0.1)
Basophils Relative: 0 % (ref 0–1)
Eosinophils Absolute: 0.1 10*3/uL (ref 0.0–0.7)
Eosinophils Relative: 1 % (ref 0–5)
Lymphocytes Relative: 44 % (ref 12–46)
Lymphs Abs: 3.1 10*3/uL (ref 0.7–4.0)
Monocytes Absolute: 0.7 10*3/uL (ref 0.1–1.0)
Monocytes Relative: 10 % (ref 3–12)
Neutro Abs: 3.2 10*3/uL (ref 1.7–7.7)
Neutrophils Relative %: 45 % (ref 43–77)

## 2011-08-28 LAB — CBC
HCT: 40.1 % (ref 36.0–46.0)
Hemoglobin: 13.9 g/dL (ref 12.0–15.0)
MCH: 30.8 pg (ref 26.0–34.0)
MCHC: 34.7 g/dL (ref 30.0–36.0)
MCV: 88.7 fL (ref 78.0–100.0)
Platelets: 306 10*3/uL (ref 150–400)
RBC: 4.52 MIL/uL (ref 3.87–5.11)
RDW: 12 % (ref 11.5–15.5)
WBC: 7.1 10*3/uL (ref 4.0–10.5)

## 2011-08-28 LAB — URINALYSIS, ROUTINE W REFLEX MICROSCOPIC
Bilirubin Urine: NEGATIVE
Glucose, UA: NEGATIVE mg/dL
Ketones, ur: NEGATIVE mg/dL
Nitrite: POSITIVE — AB
Protein, ur: NEGATIVE mg/dL
Specific Gravity, Urine: 1.026 (ref 1.005–1.030)
Urobilinogen, UA: 0.2 mg/dL (ref 0.0–1.0)
pH: 6.5 (ref 5.0–8.0)

## 2011-08-28 LAB — POCT PREGNANCY, URINE: Preg Test, Ur: NEGATIVE

## 2011-08-28 LAB — POCT I-STAT, CHEM 8
BUN: 12 mg/dL (ref 6–23)
Calcium, Ion: 1.25 mmol/L (ref 1.12–1.32)
Chloride: 108 mEq/L (ref 96–112)
Creatinine, Ser: 1 mg/dL (ref 0.50–1.10)
Glucose, Bld: 73 mg/dL (ref 70–99)
HCT: 42 % (ref 36.0–46.0)
Hemoglobin: 14.3 g/dL (ref 12.0–15.0)
Potassium: 4.3 mEq/L (ref 3.5–5.1)
Sodium: 140 mEq/L (ref 135–145)
TCO2: 23 mmol/L (ref 0–100)

## 2011-08-28 LAB — D-DIMER, QUANTITATIVE: D-Dimer, Quant: 0.57 ug/mL-FEU — ABNORMAL HIGH (ref 0.00–0.48)

## 2011-08-28 MED ORDER — HYDROMORPHONE HCL PF 1 MG/ML IJ SOLN
1.0000 mg | INTRAMUSCULAR | Status: DC | PRN
  Administered 2011-08-29: 1 mg via INTRAVENOUS
  Filled 2011-08-28: qty 1

## 2011-08-28 MED ORDER — ONDANSETRON HCL 4 MG/2ML IJ SOLN: 4.0000 mg | Freq: Four times a day (QID) | INTRAMUSCULAR | Status: DC | PRN

## 2011-08-28 MED ORDER — HYDROMORPHONE HCL PF 1 MG/ML IJ SOLN
1.0000 mg | Freq: Once | INTRAMUSCULAR | Status: AC
  Administered 2011-08-28: 1 mg via INTRAVENOUS
  Filled 2011-08-28: qty 1

## 2011-08-28 MED ORDER — ENOXAPARIN SODIUM 80 MG/0.8ML ~~LOC~~ SOLN
80.0000 mg | SUBCUTANEOUS | Status: AC
  Administered 2011-08-29: 80 mg via SUBCUTANEOUS
  Filled 2011-08-28: qty 0.8

## 2011-08-28 MED ORDER — DIPHENHYDRAMINE HCL 50 MG/ML IJ SOLN
25.0000 mg | Freq: Once | INTRAMUSCULAR | Status: AC
  Administered 2011-08-28: 25 mg via INTRAVENOUS
  Filled 2011-08-28: qty 1

## 2011-08-28 MED ORDER — ASPIRIN 81 MG PO CHEW: 324.0000 mg | CHEWABLE_TABLET | Freq: Once | ORAL | Status: DC

## 2011-08-28 NOTE — ED Notes (Signed)
Charge RN at bedside attempting to place IV

## 2011-08-28 NOTE — ED Notes (Signed)
Pt having intermittent chest pressure x 1 week. Pt recently seen for the same. Pt also SOB and pain with breathing. sts 8/10 intitially. Now 4/10 intermittent. Took ASA prior to EMS arrival.

## 2011-08-28 NOTE — ED Notes (Signed)
IV team unable to gain IV access 

## 2011-08-28 NOTE — ED Provider Notes (Addendum)
History     CSN: [id number]  Arrival date & time 08/28/11  1540   First MD Initiated Contact with Patient 08/28/11 1558      Chief Complaint  Patient presents with  . Chest Pain    (Consider location/radiation/quality/duration/timing/severity/associated sxs/prior treatment) HPI Comments: Patient is a 46 year old woman who says she keeps having chest pain and pressure feeling in the chest makes it hard for her to breathe. This started on Monday, 5 days ago. She was at work, and went to a [facility] because it was close to her place of work. She was admitted, diagnosed with gastroesophageal reflux disease and esophageal spasm or she was prescribed Protonix. She was released on Tuesday, but her pain recurred on Wednesday. She was again admitted there, was observed overnight, and had a stress test on Thursday, which apparently was negative. Her potassium was noted to be low. She was given toward potassium. She is currently on proton accident also takes nonsteroidal anti-inflammatory medicine, and naproxen. She said when her pain came on today she took a naproxen but the pain seemed to get worse. She therefore sought reevaluation.  Patient is a 46 y.o. female presenting with chest pain.  Chest Pain The chest pain began 1 - 2 hours ago. Chest pain occurs intermittently. The chest pain is unchanged. At its most intense, the pain is at 8/10. The pain is currently at 3/10. The severity of the pain is moderate. The quality of the pain is described as pressure-like. The pain does not radiate. Exacerbated by: NSAID. Primary symptoms include shortness of breath. Pertinent negatives for primary symptoms include no fever. She tried NSAIDs (Pain seemed to be worse after taking NSAID. ) for the symptoms. Past medical history comments: Prior DX GERD.  Her family medical history is significant for diabetes in family and hypertension in family.  Procedure history is positive for exercise treadmill  test.     Past Medical History  Diagnosis Date  . No pertinent past medical history   . Acid reflux     Past Surgical History  Procedure Date  . Cesarean section 1988 & 2008  . Wisdom tooth extraction   . Cartilage surgery     RT wrist    Family History  Problem Relation Age of Onset  . Hypertension Mother   . Huntington's disease Mother   . Diabetes Father   . Hypertension Father   . Scoliosis Sister   . Diabetes Brother   . Hypertension Brother   . Scoliosis Brother     History  Substance Use Topics  . Smoking status: Never Smoker   . Smokeless tobacco: Not on file  . Alcohol Use: 0.6 oz/week    1 Glasses of wine per week     occassional    OB History    Grav Para Term Preterm Abortions TAB SAB Ect Mult Living   4 2 2  2 1 1   2       Review of Systems  Constitutional: Negative.  Negative for fever and chills.  HENT: Negative.   Eyes: Negative.   Respiratory: Positive for shortness of breath.   Cardiovascular: Positive for chest pain.  Gastrointestinal: Negative.   Genitourinary: Negative.   Musculoskeletal: Negative.   Skin: Negative.   Neurological: Negative.   Psychiatric/Behavioral: Negative.     Allergies  Ibuprofen; Morphine and related; Penicillins; and Pseudoephedrine  Home Medications   Current Outpatient Rx  Name Route Sig Dispense Refill  . CETIRIZINE HCL  10 MG PO TABS Oral Take 10 mg by mouth daily as needed. For allergy symptoms    . PANTOPRAZOLE SODIUM 40 MG PO TBEC Oral Take 40 mg by mouth daily.      BP 108/70  Temp(Src) 98.8 F (37.1 C) (Oral)  Resp 14  SpO2 100%  Physical Exam  Nursing note and vitals reviewed. Constitutional: She is oriented to person, place, and time. She appears well-developed and well-nourished. No distress.  HENT:  Head: Normocephalic and atraumatic.  Right Ear: External ear normal.  Left Ear: External ear normal.  Mouth/Throat: Oropharynx is clear and moist.  Eyes: Conjunctivae and EOM are  normal. Pupils are equal, round, and reactive to light.  Neck: Normal range of motion. Neck supple.  Cardiovascular: Normal rate, regular rhythm and normal heart sounds.   Pulmonary/Chest: Effort normal and breath sounds normal.  Abdominal: Soft. Bowel sounds are normal.  Musculoskeletal: Normal range of motion. She exhibits no edema and no tenderness.  Neurological: She is alert and oriented to person, place, and time.       No sensory or motor deficits.  Skin: Skin is warm and dry.  Psychiatric: She has a normal mood and affect. Her behavior is normal.    ED Course  Procedures (including critical care time)  3:59 PM  Date: 08/28/2011  Rate: 69  Rhythm: normal sinus rhythm  QRS Axis: normal  Intervals: normal PQRS:  Left atrial abnormality.  ST/T Wave abnormalities: nonspecific T wave changes  Conduction Disutrbances:none  Narrative Interpretation: Abnormal EKG.   Old EKG Reviewed: unchanged  4:38 PM Pt seen --> physical exam performed.  EKG non-acute.  Lab workup ordered.  [facility] charts from [facility] requested.  7:03 PM Results for orders placed during the hospital encounter of 08/28/11  D-DIMER, QUANTITATIVE      Component Value Range   D-Dimer, Quant 0.57 (*) 0.00 - 0.48 (ug/mL-FEU)  CBC      Component Value Range   WBC 7.1  4.0 - 10.5 (K/uL)   RBC 4.52  3.87 - 5.11 (MIL/uL)   Hemoglobin 13.9  12.0 - 15.0 (g/dL)   HCT 40.9  81.1 - 91.4 (%)   MCV 88.7  78.0 - 100.0 (fL)   MCH 30.8  26.0 - 34.0 (pg)   MCHC 34.7  30.0 - 36.0 (g/dL)   RDW 78.2  95.6 - 21.3 (%)   Platelets 306  150 - 400 (K/uL)  DIFFERENTIAL      Component Value Range   Neutrophils Relative 45  43 - 77 (%)   Neutro Abs 3.2  1.7 - 7.7 (K/uL)   Lymphocytes Relative 44  12 - 46 (%)   Lymphs Abs 3.1  0.7 - 4.0 (K/uL)   Monocytes Relative 10  3 - 12 (%)   Monocytes Absolute 0.7  0.1 - 1.0 (K/uL)   Eosinophils Relative 1  0 - 5 (%)   Eosinophils Absolute 0.1  0.0 - 0.7 (K/uL)   Basophils Relative 0  0 - 1  (%)   Basophils Absolute 0.0  0.0 - 0.1 (K/uL)  COMPREHENSIVE METABOLIC PANEL      Component Value Range   Sodium 137  135 - 145 (mEq/L)   Potassium 4.2  3.5 - 5.1 (mEq/L)   Chloride 106  96 - 112 (mEq/L)   CO2 22  19 - 32 (mEq/L)   Glucose, Bld 74  70 - 99 (mg/dL)   BUN 12  6 - 23 (mg/dL)   Creatinine, Ser 0.86  0.50 - 1.10 (mg/dL)   Calcium 9.4  8.4 - 16.1 (mg/dL)   Total Protein 7.3  6.0 - 8.3 (g/dL)   Albumin 3.6  3.5 - 5.2 (g/dL)   AST 11  0 - 37 (U/L)   ALT 10  0 - 35 (U/L)   Alkaline Phosphatase 79  39 - 117 (U/L)   Total Bilirubin 0.3  0.3 - 1.2 (mg/dL)   GFR calc non Af Amer 73 (*) >90 (mL/min)   GFR calc Af Amer 84 (*) >90 (mL/min)  POCT I-STAT, CHEM 8      Component Value Range   Sodium 140  135 - 145 (mEq/L)   Potassium 4.3  3.5 - 5.1 (mEq/L)   Chloride 108  96 - 112 (mEq/L)   BUN 12  6 - 23 (mg/dL)   Creatinine, Ser 0.96  0.50 - 1.10 (mg/dL)   Glucose, Bld 73  70 - 99 (mg/dL)   Calcium, Ion 0.45  4.09 - 1.32 (mmol/L)   TCO2 23  0 - 100 (mmol/L)   Hemoglobin 14.3  12.0 - 15.0 (g/dL)   HCT 81.1  91.4 - 78.2 (%)  POCT I-STAT TROPONIN I      Component Value Range   Troponin i, poc 0.01  0.00 - 0.08 (ng/mL)   Comment 3            Dg Chest 2 View  08/28/2011  *RADIOLOGY REPORT*  Clinical Data: Chest pain  CHEST - 2 VIEW  Comparison: 06/02/2007  Findings: Lungs are clear. No pleural effusion or pneumothorax.  The heart is normal in size.  High right paratracheal opacity, incompletely characterized.  Visualized osseous structures are within normal limits.  IMPRESSION: High right paratracheal opacity, incompletely characterized.  CT chest with contrast is suggested for further characterization.  Original Report Authenticated By: Charline Bills, M.D.    Lab tests showed no evidence of MI.  She has a right paratracheal opacity and slightly elevated D-dimer; will get CT angio of chest to further evaluate.  She continues to complain of pain; dilaudid and Benadryl ordered.     9:26 PM Prolonged wait for CT angio of chest due to poor IV access; IVRN working to get IV good enough for the IV infusor.    11:10 PM After multiple tries for a satisfactory IV, including the IV nurse, no IV sufficient for the IV infusor could be gotten.  Will plan to place pt in the CDU Holding, to get a V/Q scan in the AM.  Will give injection of Lovenox to protect against PE tonight.  If her V/Q is negative it should be safe for her to go home.  12:21 AM Radiologist called to advise that pt should get a non-contrast CT to check for a mass, as V/Q scan would not show that.    Pt's care transferred to Dr. Oletta Lamas at 12:25 A.M.     1. Chest pain            Carleene Cooper III, MD 08/29/11 0022  Carleene Cooper III, MD 10/16/11 1356

## 2011-08-29 ENCOUNTER — Emergency Department (HOSPITAL_COMMUNITY): Payer: Self-pay

## 2011-08-29 LAB — POCT I-STAT TROPONIN I: Troponin i, poc: 0 ng/mL (ref 0.00–0.08)

## 2011-08-29 MED ORDER — TECHNETIUM TO 99M ALBUMIN AGGREGATED
3.3000 | Freq: Once | INTRAVENOUS | Status: AC | PRN
  Administered 2011-08-29: 3 via INTRAVENOUS

## 2011-08-29 NOTE — ED Notes (Signed)
Pt moved to Room 3 in CDU

## 2011-08-29 NOTE — ED Notes (Signed)
Pt resting quietly with no s/s of an pain or distress observed, pt is easily arousable and will continue to monitor pt.

## 2011-08-29 NOTE — ED Provider Notes (Signed)
I assumed care from Dr. Lavena Stanford at shift change.  The patient has been having chest pain for the past week.  She had a negative stress test at [facility].  Her D-Dimer was positive here and she was placed in the CDU for rule out of pe.  Her labs are unremarkable and she appears well this morning.  The V/Q was performed and was negative.  She will be discharged to home, to return prn if worsens.  Geoffery Lyons, MD 08/29/11 [phone]

## 2011-08-29 NOTE — Discharge Instructions (Signed)
Chest Pain (Nonspecific) It is often hard to give a specific diagnosis for the cause of chest pain. There is always a chance that your pain could be related to something serious, such as a heart attack or a blood clot in the lungs. You need to follow up with your caregiver for further evaluation. CAUSES   Heartburn.   Pneumonia or bronchitis.   Anxiety or stress.   Inflammation around your heart (pericarditis) or lung (pleuritis or pleurisy).   A blood clot in the lung.   A collapsed lung (pneumothorax). It can develop suddenly on its own (spontaneous pneumothorax) or from injury (trauma) to the chest.   Shingles infection (herpes zoster virus).  The chest wall is composed of bones, muscles, and cartilage. Any of these can be the source of the pain.  The bones can be bruised by injury.   The muscles or cartilage can be strained by coughing or overwork.   The cartilage can be affected by inflammation and become sore (costochondritis).  DIAGNOSIS  Lab tests or other studies, such as X-rays, electrocardiography, stress testing, or cardiac imaging, may be needed to find the cause of your pain.  TREATMENT   Treatment depends on what may be causing your chest pain. Treatment may include:   Acid blockers for heartburn.   Anti-inflammatory medicine.   Pain medicine for inflammatory conditions.   Antibiotics if an infection is present.   You may be advised to change lifestyle habits. This includes stopping smoking and avoiding alcohol, caffeine, and chocolate.   You may be advised to keep your head raised (elevated) when sleeping. This reduces the chance of acid going backward from your stomach into your esophagus.   Most of the time, nonspecific chest pain will improve within 2 to 3 days with rest and mild pain medicine.  HOME CARE INSTRUCTIONS   If antibiotics were prescribed, take your antibiotics as directed. Finish them even if you start to feel better.   For the next few  days, avoid physical activities that bring on chest pain. Continue physical activities as directed.   Do not smoke.   Avoid drinking alcohol.   Only take over-the-counter or prescription medicine for pain, discomfort, or fever as directed by your caregiver.   Follow your caregiver's suggestions for further testing if your chest pain does not go away.   Keep any follow-up appointments you made. If you do not go to an appointment, you could develop lasting (chronic) problems with pain. If there is any problem keeping an appointment, you must call to reschedule.  SEEK MEDICAL CARE IF:   You think you are having problems from the medicine you are taking. Read your medicine instructions carefully.   Your chest pain does not go away, even after treatment.   You develop a rash with blisters on your chest.  SEEK IMMEDIATE MEDICAL CARE IF:   You have increased chest pain or pain that spreads to your arm, neck, jaw, back, or abdomen.   You develop shortness of breath, an increasing cough, or you are coughing up blood.   You have severe back or abdominal pain, feel nauseous, or vomit.   You develop severe weakness, fainting, or chills.   You have a fever.  THIS IS AN EMERGENCY. Do not wait to see if the pain will go away. Get medical help at once. Call your local emergency services (911 in [place] Do not drive yourself to the hospital. MAKE SURE YOU:   Understand these instructions.     Will watch your condition.   Will get help right away if you are not doing well or get worse.  Document Released: 02/03/2005 Document Revised: 04/15/2011 Document Reviewed: 11/30/2007 [employer] Patient Information 2012 [employer]. 

## 2011-08-29 NOTE — ED Notes (Signed)
Pt reports having chest pain 5/10 at this time, pt will be medicated per MAR. Plan of care is updated with verbal understanding, will continue to monitor pt.

## 2011-09-17 ENCOUNTER — Encounter (HOSPITAL_COMMUNITY): Payer: Self-pay | Admitting: Emergency Medicine

## 2011-09-17 ENCOUNTER — Emergency Department (INDEPENDENT_AMBULATORY_CARE_PROVIDER_SITE_OTHER)
Admission: EM | Admit: 2011-09-17 | Discharge: 2011-09-17 | Disposition: A | Discharge status: Home / Self Care | Payer: Self-pay | Source: Home / Self Care | Attending: Emergency Medicine | Admitting: Emergency Medicine

## 2011-09-17 DIAGNOSIS — L03116 Cellulitis of left lower limb: Secondary | ICD-10-CM

## 2011-09-17 DIAGNOSIS — L02419 Cutaneous abscess of limb, unspecified: Secondary | ICD-10-CM

## 2011-09-17 DIAGNOSIS — T148XXA Other injury of unspecified body region, initial encounter: Secondary | ICD-10-CM

## 2011-09-17 DIAGNOSIS — L03119 Cellulitis of unspecified part of limb: Secondary | ICD-10-CM

## 2011-09-17 DIAGNOSIS — L089 Local infection of the skin and subcutaneous tissue, unspecified: Secondary | ICD-10-CM

## 2011-09-17 HISTORY — DX: Other seasonal allergic rhinitis: J30.2

## 2011-09-17 MED ORDER — ACETAMINOPHEN-CODEINE #3 300-30 MG PO TABS: 1.0000 | ORAL_TABLET | Freq: Four times a day (QID) | ORAL | Status: AC | PRN

## 2011-09-17 MED ORDER — CEPHALEXIN 500 MG PO CAPS: 500.0000 mg | ORAL_CAPSULE | Freq: Four times a day (QID) | ORAL | Status: AC

## 2011-09-17 NOTE — Discharge Instructions (Signed)
  This superficial wound that secondarily infected, start with prescribed antibiotics every 6- hours while you're awake. Improvement should be noted anywhere from 48-72 hours (2-3 days). Return if no improvement is noted in the first 2-3 days. If worsening despite this antibiotic fevers or chills should go to the emergency department for further treatment    Cellulitis Cellulitis is an infection of the tissue under the skin. The infected area is usually red and tender. This is caused by germs. These germs enter the body through cuts or sores. This usually happens in the arms or lower legs. HOME CARE   Take your medicine as told. Finish it even if you start to feel better.   If the infection is on the arm or leg, keep it raised (elevated).   Use a warm cloth on the infected area several times a day.   See your doctor for a follow-up visit as told.  GET HELP RIGHT AWAY IF:   You are tired or confused.   You throw up (vomit).   You have watery poop (diarrhea).   You feel ill and have muscle aches.   You have a fever.  MAKE SURE YOU:   Understand these instructions.   Will watch your condition.   Will get help right away if you are not doing well or get worse.  Document Released: 10/13/2007 Document Revised: 04/15/2011 Document Reviewed: 03/28/2009 [facility] Patient Information 2012 [place], [place].

## 2011-09-17 NOTE — ED Notes (Addendum)
C/o pain in left ankle, lateral.  Swelling and redness to lateral ankle.  Onset 6 days ago.  approx 4.5cm scrape above ankle, patient reports this was secondary to a razor while shaving legs.  Reports razor was new.  Pedal pulses palpable

## 2011-09-17 NOTE — ED Provider Notes (Signed)
History     CSN: [id number]  Arrival date & time 09/17/11  1419   First MD Initiated Contact with Patient 09/17/11 1427      Chief Complaint  Patient presents with  . Ankle Pain    (Consider location/radiation/quality/duration/timing/severity/associated sxs/prior treatment) HPI Comments: Patient reports last Friday she was shaving her legs and she accidentally cut the outer part of her left ankle and she bled at that time. Since then she's been applying cocoa butter but she feels around the area has become swollen and very tender. Patient denies any fevers or chills. But it does hurt when she moves around and it feels warm.  Patient is a 46 y.o. female presenting with ankle pain. The history is provided by the patient.  Ankle Pain  The incident occurred more than 1 week ago. The incident occurred at home. The injury mechanism is unknown. The pain is present in the left ankle. The pain is at a severity of 5/10. The pain is moderate. Pertinent negatives include no numbness, no loss of motion, no muscle weakness, no loss of sensation and no tingling.    Past Medical History  Diagnosis Date  . No pertinent past medical history   . Acid reflux   . Seasonal allergies     Past Surgical History  Procedure Date  . Cesarean section 1988 & 2008  . Wisdom tooth extraction   . Cartilage surgery     RT wrist    Family History  Problem Relation Age of Onset  . Hypertension Mother   . Huntington's disease Mother   . Diabetes Father   . Hypertension Father   . Scoliosis Sister   . Diabetes Brother   . Hypertension Brother   . Scoliosis Brother     History  Substance Use Topics  . Smoking status: Never Smoker   . Smokeless tobacco: Not on file  . Alcohol Use: 0.6 oz/week    1 Glasses of wine per week     occassional    OB History    Grav Para Term Preterm Abortions TAB SAB Ect Mult Living   4 2 2  2 1 1   2       Review of Systems  Constitutional: Negative for fever,  chills, appetite change and fatigue.  Skin: Positive for wound.  Neurological: Negative for tingling and numbness.    Allergies  Ibuprofen; Morphine and related; Penicillins; and Pseudoephedrine  Home Medications   Current Outpatient Rx  Name Route Sig Dispense Refill  . CETIRIZINE HCL 10 MG PO TABS Oral Take 10 mg by mouth daily as needed. For allergy symptoms    . NAPROXEN SODIUM 275 MG PO TABS Oral Take 275 mg by mouth 2 (two) times daily with a meal.    . PANTOPRAZOLE SODIUM 40 MG PO TBEC Oral Take 40 mg by mouth daily.      BP 109/66  Pulse 80  Temp(Src) 98.8 F (37.1 C) (Oral)  Resp 18  SpO2 100%  LMP 08/12/2011  Physical Exam  Nursing note and vitals reviewed. Constitutional: She appears well-developed and well-nourished.  Musculoskeletal: She exhibits edema and tenderness.       Legs: Skin: Rash noted. There is erythema.    ED Course  Procedures (including critical care time)  Labs Reviewed - No data to display No results found.   No diagnosis found.    MDM  Post duration/laceration cellulitis on the external aspect of left ankle. Patient is afebrile. Have started her  on Keflex. Patient described she had mild itchiness when she took penicillin when she was a child. Described to her the low probability of cross sensitivity with Keflex and what to do or inform us if any potential effects patient understands and agrees to take Keflex and will be attentive of any potential symptoms.        Jimmie Molly, MD 09/17/11 [phone]

## 2011-09-21 ENCOUNTER — Encounter (HOSPITAL_COMMUNITY): Payer: Self-pay | Admitting: *Deleted

## 2011-09-21 ENCOUNTER — Emergency Department (HOSPITAL_COMMUNITY)
Admission: EM | Admit: 2011-09-21 | Discharge: 2011-09-21 | Disposition: A | Discharge status: Home / Self Care | Payer: Self-pay | Attending: Emergency Medicine | Admitting: Emergency Medicine

## 2011-09-21 ENCOUNTER — Telehealth (HOSPITAL_COMMUNITY): Payer: Self-pay | Admitting: *Deleted

## 2011-09-21 DIAGNOSIS — M79609 Pain in unspecified limb: Secondary | ICD-10-CM | POA: Insufficient documentation

## 2011-09-21 DIAGNOSIS — R509 Fever, unspecified: Secondary | ICD-10-CM | POA: Insufficient documentation

## 2011-09-21 DIAGNOSIS — L03119 Cellulitis of unspecified part of limb: Secondary | ICD-10-CM | POA: Insufficient documentation

## 2011-09-21 DIAGNOSIS — L0390 Cellulitis, unspecified: Secondary | ICD-10-CM

## 2011-09-21 DIAGNOSIS — K219 Gastro-esophageal reflux disease without esophagitis: Secondary | ICD-10-CM | POA: Insufficient documentation

## 2011-09-21 DIAGNOSIS — L02419 Cutaneous abscess of limb, unspecified: Secondary | ICD-10-CM | POA: Insufficient documentation

## 2011-09-21 HISTORY — DX: Cellulitis, unspecified: L03.90

## 2011-09-21 MED ORDER — SULFAMETHOXAZOLE-TMP DS 800-160 MG PO TABS: 2.0000 | ORAL_TABLET | Freq: Two times a day (BID) | ORAL | Status: AC

## 2011-09-21 MED ORDER — SULFAMETHOXAZOLE-TMP DS 800-160 MG PO TABS
2.0000 | ORAL_TABLET | Freq: Once | ORAL | Status: AC
  Administered 2011-09-21: 2 via ORAL
  Filled 2011-09-21: qty 2

## 2011-09-21 MED ORDER — ACETAMINOPHEN-CODEINE #3 300-30 MG PO TABS: 1.0000 | ORAL_TABLET | Freq: Four times a day (QID) | ORAL | Status: AC | PRN

## 2011-09-21 NOTE — ED Notes (Signed)
Pt is here after having several days of antibiotics for diagnosis of cellulitis to left lower leg and ankle.  Pt has wound that was from a new razor.  Pulse present.  Pt reports calf pain as well

## 2011-09-21 NOTE — ED Notes (Signed)
Pt. called on VM 5/13 @ 1835 but no mesage. 5/14 1327 I called and asked pt. to call back. I could not understand her name [name] or [name], to look for her chart. 5/14 1339 Pt. called back and said her name was Katherine Gregory.  She said she saw Dr. Ladon Applebaum on Fri.  She was treated with Keflex for cellulitis. States its still sore and swollen.  Stands at work and can't keep it elevated.  C/o pain in her L lower leg.  States it is draining a little on the bandaid. I told pt. She should come back and be rechecked if not improving as her d/c instructions said to do.  Pt. voiced understanding. Vassie Moselle 09/21/2011

## 2011-09-21 NOTE — Discharge Instructions (Signed)

## 2011-09-21 NOTE — ED Notes (Signed)
Discussed with Dr. Ladon Applebaum and he said to tell pt. to get rechecked at the ED because she may need to be checked for a DVT or need other tx. we can't provide. I called pt. back and gave her this information.  She said "[place], [place]" Katherine Gregory 09/21/2011

## 2011-09-21 NOTE — ED Provider Notes (Signed)
History    Scribed for Katherine Numbers, MD, the patient was seen in room STRE4/STRE4. This chart was scribed by Katha Cabal.   CSN: [id number]  Arrival date & time 09/21/11  1422   First MD Initiated Contact with Patient 09/21/11 1439      Chief Complaint  Patient presents with  . Leg Pain    left side and pain in calf    (Consider location/radiation/quality/duration/timing/severity/associated sxs/prior treatment) HPI Katherine Numbers, MD entered patient's room at 2:46 PM    Katherine Gregory is a 46 y.o. female who presents to the Emergency Department complaining of mild left lower leg pain.  Pain described as soreness.  Patient was shaving legs and cut herself with razor over a week ago.  Patient was seen in ED 4 days ago and prescribed antibiotics for cellulitis. She was started on keflex. Patient reports feeling feverish yesterday.  Symptoms are not associated with nausea, vomiting.  Patient taking Benadryl with antibiotics.  Taking Tylenol with Codeine for pain but ran out of medication.  Patient reports left calf pain that resolved.  There is no history of DVTs.   There are no other complaints.   No history of diabetes mellitus.      PCP Thomos Lemons, DO, DO     Past Medical History  Diagnosis Date  . No pertinent past medical history   . Acid reflux   . Seasonal allergies   . Cellulitis     Past Surgical History  Procedure Date  . Cesarean section 1988 & 2008  . Wisdom tooth extraction   . Cartilage surgery     RT wrist    Family History  Problem Relation Age of Onset  . Hypertension Mother   . Huntington's disease Mother   . Diabetes Father   . Hypertension Father   . Scoliosis Sister   . Diabetes Brother   . Hypertension Brother   . Scoliosis Brother     History  Substance Use Topics  . Smoking status: Never Smoker   . Smokeless tobacco: Not on file  . Alcohol Use: 0.6 oz/week    1 Glasses of wine per week     occassional    OB History    Grav Para  Term Preterm Abortions TAB SAB Ect Mult Living   4 2 2  2 1 1   2       Review of Systems  Constitutional: Positive for fever.  HENT: Negative.   Eyes: Negative.   Respiratory: Negative.   Cardiovascular: Negative.   Gastrointestinal: Negative.   Genitourinary: Negative.   Musculoskeletal: Negative.   Skin: Positive for wound.  Neurological: Negative.   Hematological: Negative.   Psychiatric/Behavioral: Negative.   All other systems reviewed and are negative.    Allergies  Ibuprofen; Morphine and related; Penicillins; and Pseudoephedrine  Home Medications   Current Outpatient Rx  Name Route Sig Dispense Refill  . ACETAMINOPHEN-CODEINE #3 300-30 MG PO TABS Oral Take 1-2 tablets by mouth every 6 (six) hours as needed for pain. 15 tablet 0  . ACETAMINOPHEN-CODEINE #3 300-30 MG PO TABS Oral Take 1-2 tablets by mouth every 6 (six) hours as needed for pain. 15 tablet 0  . CEPHALEXIN 500 MG PO CAPS Oral Take 1 capsule (500 mg total) by mouth 4 (four) times daily. 40 capsule 0  . CETIRIZINE HCL 10 MG PO TABS Oral Take 10 mg by mouth daily as needed. For allergy symptoms    . NAPROXEN SODIUM 275 MG  PO TABS Oral Take 275 mg by mouth 2 (two) times daily with a meal.    . PANTOPRAZOLE SODIUM 40 MG PO TBEC Oral Take 40 mg by mouth daily.    . SULFAMETHOXAZOLE-TMP DS 800-160 MG PO TABS Oral Take 2 tablets by mouth 2 (two) times daily. 40 tablet 0    BP 119/76  Pulse 94  Temp(Src) 97.9 F (36.6 C) (Oral)  Resp 18  SpO2 100%  LMP 08/12/2011  Physical Exam  Nursing note and vitals reviewed. Constitutional: She is oriented to person, place, and time. She appears well-developed and well-nourished. No distress.  HENT:  Head: Normocephalic and atraumatic.  Eyes: EOM are normal.  Neck: Neck supple. No tracheal deviation present.  Cardiovascular: Normal rate.   Pulmonary/Chest: Effort normal. No respiratory distress.  Musculoskeletal: Normal range of motion.       Left lower leg:  2  inch avulsion of skin 2 mm in diameter, no surrounding erythema, Swelling and tenderness of left lateral malleolus that appears to be extending distally   Neurological: She is alert and oriented to person, place, and time. Cranial nerve deficit: cranial nerves 2-12 grossly intact   Skin: Skin is warm and dry.       See MSK for description of wound  Psychiatric: She has a normal mood and affect. Her behavior is normal.    ED Course  Procedures (including critical care time)   DIAGNOSTIC STUDIES: Oxygen Saturation is 100% on room air, normal by my interpretation.     COORDINATION OF CARE: 2:53 PM  Physical exam complete.  Will give 2 tabs of Bactrim.  No pain medication given in ED as patient is driving.  Discussed plan of treatment with patient.   2:57 PM  Plan to discharge patient.  Patient agrees with plan.       LABS / RADIOLOGY:   Labs Reviewed - No data to display No results found.       MDM  Patient was evaluated and had stable vital signs.  She did continue to have some erythema and swelling distal to her wound.  Bactrim was added to her regimen for more complete coverage of skin flora.  Patient was given prescription for this and Tylenol #3 and patient was discharged in good condition.     MEDICATIONS GIVEN IN THE E.D. Scheduled Meds:    . sulfamethoxazole-trimethoprim  2 tablet Oral Once   Continuous Infusions:      IMPRESSION: 1. Cellulitis      NEW MEDICATIONS: New Prescriptions   ACETAMINOPHEN-CODEINE (TYLENOL #3) 300-30 MG PER TABLET    Take 1-2 tablets by mouth every 6 (six) hours as needed for pain.   SULFAMETHOXAZOLE-TRIMETHOPRIM (BACTRIM DS) 800-160 MG PER TABLET    Take 2 tablets by mouth 2 (two) times daily.      I personally performed the services described in this documentation, which was scribed in my presence. The recorded information has been reviewed and considered.         Katherine Numbers, MD 09/24/11 1245

## 2011-11-10 ENCOUNTER — Encounter (HOSPITAL_COMMUNITY): Payer: Self-pay | Admitting: *Deleted

## 2011-11-10 ENCOUNTER — Emergency Department (INDEPENDENT_AMBULATORY_CARE_PROVIDER_SITE_OTHER)
Admission: EM | Admit: 2011-11-10 | Discharge: 2011-11-10 | Disposition: A | Discharge status: Nursing Home (Includes ICF) | Payer: BC Managed Care – PPO | Source: Home / Self Care | Attending: Family Medicine | Admitting: Family Medicine

## 2011-11-10 ENCOUNTER — Encounter (HOSPITAL_COMMUNITY): Payer: Self-pay | Admitting: Emergency Medicine

## 2011-11-10 ENCOUNTER — Observation Stay (HOSPITAL_COMMUNITY)
Admission: EM | Admit: 2011-11-10 | Discharge: 2011-11-11 | Disposition: A | Discharge status: Home / Self Care | Payer: BC Managed Care – PPO | Attending: Emergency Medicine | Admitting: Emergency Medicine

## 2011-11-10 ENCOUNTER — Observation Stay (HOSPITAL_COMMUNITY): Payer: BC Managed Care – PPO

## 2011-11-10 DIAGNOSIS — R079 Chest pain, unspecified: Principal | ICD-10-CM | POA: Insufficient documentation

## 2011-11-10 DIAGNOSIS — R0602 Shortness of breath: Secondary | ICD-10-CM | POA: Insufficient documentation

## 2011-11-10 DIAGNOSIS — R42 Dizziness and giddiness: Secondary | ICD-10-CM

## 2011-11-10 DIAGNOSIS — R001 Bradycardia, unspecified: Secondary | ICD-10-CM

## 2011-11-10 DIAGNOSIS — R0600 Dyspnea, unspecified: Secondary | ICD-10-CM

## 2011-11-10 DIAGNOSIS — K219 Gastro-esophageal reflux disease without esophagitis: Secondary | ICD-10-CM | POA: Insufficient documentation

## 2011-11-10 DIAGNOSIS — I498 Other specified cardiac arrhythmias: Secondary | ICD-10-CM

## 2011-11-10 LAB — CBC
HCT: 39.2 % (ref 36.0–46.0)
Hemoglobin: 13.5 g/dL (ref 12.0–15.0)
MCH: 30.8 pg (ref 26.0–34.0)
MCHC: 34.4 g/dL (ref 30.0–36.0)
MCV: 89.3 fL (ref 78.0–100.0)
Platelets: 312 10*3/uL (ref 150–400)
RBC: 4.39 MIL/uL (ref 3.87–5.11)
RDW: 12.1 % (ref 11.5–15.5)
WBC: 6.7 10*3/uL (ref 4.0–10.5)

## 2011-11-10 LAB — BASIC METABOLIC PANEL
BUN: 9 mg/dL (ref 6–23)
CO2: 22 mEq/L (ref 19–32)
Calcium: 9.4 mg/dL (ref 8.4–10.5)
Chloride: 105 mEq/L (ref 96–112)
Creatinine, Ser: 0.94 mg/dL (ref 0.50–1.10)
GFR calc Af Amer: 84 mL/min — ABNORMAL LOW (ref >90)
GFR calc non Af Amer: 73 mL/min — ABNORMAL LOW (ref >90)
Glucose, Bld: 85 mg/dL (ref 70–99)
Potassium: 4 mEq/L (ref 3.5–5.1)
Sodium: 142 mEq/L (ref 135–145)

## 2011-11-10 LAB — TROPONIN I
Troponin I: <0.3 ng/mL (ref <0.30)
Troponin I: <0.3 ng/mL (ref <0.30)

## 2011-11-10 LAB — POCT I-STAT TROPONIN I: Troponin i, poc: 0 ng/mL (ref 0.00–0.08)

## 2011-11-10 MED ORDER — SODIUM CHLORIDE 0.9 % IV SOLN
20.0000 mL | INTRAVENOUS | Status: DC
  Administered 2011-11-10 – 2011-11-11 (×2): 20 mL via INTRAVENOUS

## 2011-11-10 MED ORDER — LORAZEPAM 2 MG/ML IJ SOLN
1.0000 mg | Freq: Once | INTRAMUSCULAR | Status: AC
  Administered 2011-11-10: 1 mg via INTRAVENOUS
  Filled 2011-11-10: qty 1

## 2011-11-10 MED ORDER — KETOROLAC TROMETHAMINE 15 MG/ML IJ SOLN
15.0000 mg | INTRAMUSCULAR | Status: DC
  Filled 2011-11-10: qty 1

## 2011-11-10 NOTE — ED Notes (Signed)
[facility] notified 

## 2011-11-10 NOTE — ED Notes (Signed)
Per [facility]- pt is from urgent care. Pt was seen there for chest pain. Pt was recently seen at [facility] for the same and was diagnosed with GERD. Pt reports substernal chest pain that is 3/10 pain. Worse with movement and is intermittent. BP 11875. HR 56. 100% on 2L.

## 2011-11-10 NOTE — ED Provider Notes (Signed)
Care assumed of the patient in CDU on chest pain protocol. Has had intermittent chest pain and dyspnea for the past several months. Has been evaluated several times for same. Pt to go for stress echo in AM. Currently no complaints. CP free presently. Signed out to Dr. Dierdre Highman, Pod B overnight MD, at midnight.  Grant Fontana, PA-C 11/11/11 [phone]

## 2011-11-10 NOTE — ED Notes (Signed)
IV team paged for IV start. 

## 2011-11-10 NOTE — ED Notes (Signed)
Patient transported to X-ray 

## 2011-11-10 NOTE — ED Provider Notes (Signed)
History    46 year old female with dyspnea. Since April patient has had intermittent chest pain. Last seconds to up to several hours. Sometimes pressure sometimes sharp. No appreciable exacerbating relieving factors. Sometimes associated with shortness of breath and sometimes not. No palpitations. No fevers or chills. No cough. No unusual leg pain or swelling. Denies history of blood clot. Denies history of hypertension, high cholesterol or diabetes. She is a nonsmoker. She does have truncal obesity. Family history of heart disease. History of reflux but says these symptoms feel different.  CSN: [id number]  Arrival date & time 11/10/11  1630   First MD Initiated Contact with Patient 11/10/11 1713      Chief Complaint  Patient presents with  . Chest Pain    (Consider location/radiation/quality/duration/timing/severity/associated sxs/prior treatment) HPI  Past Medical History  Diagnosis Date  . No pertinent past medical history   . Acid reflux   . Seasonal allergies   . Cellulitis     Past Surgical History  Procedure Date  . Cesarean section 1988 & 2008  . Wisdom tooth extraction   . Cartilage surgery     RT wrist    Family History  Problem Relation Age of Onset  . Hypertension Mother   . Huntington's disease Mother   . Diabetes Father   . Hypertension Father   . Scoliosis Sister   . Diabetes Brother   . Hypertension Brother   . Scoliosis Brother     History  Substance Use Topics  . Smoking status: Never Smoker   . Smokeless tobacco: Not on file  . Alcohol Use: 0.6 oz/week    1 Glasses of wine per week     occassional    OB History    Grav Para Term Preterm Abortions TAB SAB Ect Mult Living   4 2 2  2 1 1   2       Review of Systems   Review of symptoms negative unless otherwise noted in HPI.   Allergies  Ibuprofen; Morphine and related; Penicillins; and Pseudoephedrine  Home Medications   Current Outpatient Rx  Name Route Sig Dispense Refill  .  PANTOPRAZOLE SODIUM 40 MG PO TBEC Oral Take 40 mg by mouth daily.      BP 124/83  Pulse 72  Temp 97.5 F (36.4 C)  Resp 18  SpO2 100%  LMP 10/29/2011  Physical Exam  Nursing note and vitals reviewed. Constitutional: She appears well-developed and well-nourished. No distress.       Laying in bed. No acute distress.  HENT:  Head: Normocephalic and atraumatic.  Eyes: Conjunctivae are normal. Right eye exhibits no discharge. Left eye exhibits no discharge.  Neck: Neck supple.  Cardiovascular: Normal rate, regular rhythm and normal heart sounds.  Exam reveals no gallop and no friction rub.   No murmur heard. Pulmonary/Chest: Effort normal and breath sounds normal. No respiratory distress. She exhibits no tenderness.       Chest pain is nonreproducible.  Abdominal: Soft. She exhibits no distension. There is no tenderness.  Musculoskeletal: She exhibits no edema and no tenderness.       Lower extremities symmetric as compared to each other. No calf tenderness. Negative Homan's. No palpable cords.   Neurological: She is alert.  Skin: Skin is warm and dry.  Psychiatric: She has a normal mood and affect. Her behavior is normal. Thought content normal.    ED Course  Procedures (including critical care time)  Labs Reviewed  BASIC METABOLIC PANEL - Abnormal; Notable  for the following:    GFR calc non Af Amer 73 (*)     GFR calc Af Amer 84 (*)     All other components within normal limits  CBC  TROPONIN I  TROPONIN I  POCT I-STAT TROPONIN I   Dg Chest 2 View  11/10/2011  *RADIOLOGY REPORT*  Clinical Data: Chest pain, shortness of breath.  CHEST - 2 VIEW  Comparison: 08/28/2011  Findings: There is hyperinflation of the lungs compatible with COPD.  Heart is borderline enlarged.  No effusions or focal airspace opacity.  No acute bony abnormality.  IMPRESSION: COPD.  No acute findings.  Original Report Authenticated By: Cyndie Chime, M.D.    EKG:  Rhythm: sinus brady Rate: 52 Axis:  normal Intervals: normal ST segments: t wave flattening/inversions inferiorly and anteriorly  1. Chest pain   2. Dyspnea       MDM  46 year old female with dyspnea and intermittent chest pain. Multiple evaluations in the past couple months including in the emergency room here and also at [facility]. Her past workup included a VQ scan and a noncontrast CT of her chest which not showing anything particularly concerning. Her troponins have been normal. She does have an abnormal EKG, but this is fairly similar to her prior one from April. Symptoms atypical for ACS. She is a TIMI 1 and has a heart score of 1 as well. Will place on the chest pain protocol for provocative testing in the morning. Patient self-reports previous admission recently to [facility] including a stress test. Records from March 2013 to current requested from [facility] and just showed a ER visit from just 2 days ago.[name]        Raeford Razor, MD 11/10/11 2322  Raeford Razor, MD 11/17/11 1610  Raeford Razor, MD 11/17/11 [phone]

## 2011-11-10 NOTE — ED Notes (Signed)
Looked for iv site, old stick in left ac with small bruise from recent hospital visit.  Phlebotomy stick to right ac from current visit.  This nurse did not attempt.  Alinda Money rn attempted site in right hand, unsuccessful.

## 2011-11-10 NOTE — ED Notes (Signed)
Reports chest pain onset last Sunday 12-07-11.  Patient reports episode that started at work resulted in ems notification and transport to [facility] evaluation.  Reports gerd and medicine change.  Patient reports pain in center of chest, low chest.  Radiates to back "sometimes" and moves "somewhat to the left " per patient.  Patient reports stress at home.  Did not elaborate.  Patient c/o heavy breathing.  Occasional nausea, difficulty sleeping.

## 2011-11-10 NOTE — ED Provider Notes (Signed)
History     CSN: [id number]  Arrival date & time 11/10/11  1424   First MD Initiated Contact with Patient 11/10/11 1444      Chief Complaint  Patient presents with  . Chest Pain    (Consider location/radiation/quality/duration/timing/severity/associated sxs/prior treatment) HPI Comments: 46 year old nonsmoker, nondiabetic female with history of acid reflux. Here complaining of intermittent central chest pain with radiation to back and left side of chest in the last 3 days. Was seen at [facility] emergency department 2 days ago was diagnosed with acid reflux. Patient states her symptoms have persisted and are associated with dizziness, generalized weakness and nausea, she does have acid reflux. Taking Protonix without relief. Symptoms worse today. Reports constant chest pain and feeling dizzy and weak. No vomiting. No syncope. No falls. Reports home stressors. Denies illicit drugs use.    Past Medical History  Diagnosis Date  . No pertinent past medical history   . Acid reflux   . Seasonal allergies   . Cellulitis     Past Surgical History  Procedure Date  . Cesarean section 1988 & 2008  . Wisdom tooth extraction   . Cartilage surgery     RT wrist    Family History  Problem Relation Age of Onset  . Hypertension Mother   . Huntington's disease Mother   . Diabetes Father   . Hypertension Father   . Scoliosis Sister   . Diabetes Brother   . Hypertension Brother   . Scoliosis Brother     History  Substance Use Topics  . Smoking status: Never Smoker   . Smokeless tobacco: Not on file  . Alcohol Use: 0.6 oz/week    1 Glasses of wine per week     occassional    OB History    Grav Para Term Preterm Abortions TAB SAB Ect Mult Living   4 2 2  2 1 1   2       Review of Systems  Constitutional: Positive for fatigue. Negative for fever, chills and diaphoresis.       Uncomfortable lying in bed  Respiratory: Positive for chest tightness. Negative for  shortness of breath.   Cardiovascular: Positive for chest pain. Negative for palpitations and leg swelling.  Gastrointestinal: Positive for nausea. Negative for vomiting, abdominal pain and diarrhea.  Neurological: Positive for dizziness.    Allergies  Ibuprofen; Morphine and related; Penicillins; and Pseudoephedrine  Home Medications   Current Outpatient Rx  Name Route Sig Dispense Refill  . CETIRIZINE HCL 10 MG PO TABS Oral Take 10 mg by mouth daily as needed. For allergy symptoms    . NAPROXEN SODIUM 275 MG PO TABS Oral Take 275 mg by mouth 2 (two) times daily with a meal.    . PANTOPRAZOLE SODIUM 40 MG PO TBEC Oral Take 40 mg by mouth daily.      BP 135/88  Pulse 52  Temp 98.4 F (36.9 C) (Oral)  Resp 15  SpO2 100%  LMP 10/29/2011  Physical Exam  Nursing note and vitals reviewed. Constitutional: She is oriented to person, place, and time. She appears well-developed and well-nourished.       Uncomfortable laying in bed.   HENT:  Head: Normocephalic and atraumatic.  Mouth/Throat: Oropharynx is clear and moist.  Eyes: Conjunctivae are normal. No scleral icterus.  Neck: Neck supple. No JVD present. No thyromegaly present.  Cardiovascular: Normal rate, regular rhythm and normal heart sounds.  Exam reveals no gallop and no friction rub.  No murmur heard. Pulmonary/Chest: Effort normal and breath sounds normal. No respiratory distress. She has no wheezes. She has no rales.  Abdominal: Soft. Bowel sounds are normal. She exhibits no distension and no mass. There is no tenderness. There is no rebound and no guarding.  Neurological: She is alert and oriented to person, place, and time.  Skin: No rash noted. She is not diaphoretic.    ED Course  Procedures (including critical care time)  Labs Reviewed - No data to display No results found.   1. Chest pain   2. Bradycardia   3. Dizziness       MDM  46 year old female with history of GERD. Here complaining of  intermittent chest pain for about a week today persistent associated with generalized weakness, dizziness and nausea.On exam: Patient uncomfortable. Heart rate 47-60's. Normal abdominal exam. EKG: Sinus bradycardia with a ventricular rate 54, T wave flat or inverted in anterior leads. No ST changes or other acute ischemic changes. No bundle branch, fascicular or AV blocks. Blood pressure stable in the 130s over 80s. Non-orthostatic.  Decided to transfer to the emergency department for further evaluation and management.        Sharin Grave, MD 11/10/11 4098

## 2011-11-11 LAB — POCT I-STAT TROPONIN I
Troponin i, poc: 0 ng/mL (ref 0.00–0.08)
Troponin i, poc: 0 ng/mL (ref 0.00–0.08)

## 2011-11-11 MED ORDER — NITROGLYCERIN 0.4 MG SL SUBL
SUBLINGUAL_TABLET | SUBLINGUAL | Status: AC
  Administered 2011-11-11: 0.4 mg
  Filled 2011-11-11: qty 25

## 2011-11-11 MED ORDER — FAMOTIDINE 20 MG PO TABS: 20.0000 mg | ORAL_TABLET | Freq: Two times a day (BID) | ORAL | Status: DC

## 2011-11-11 MED ORDER — LORAZEPAM 2 MG/ML IJ SOLN
1.0000 mg | Freq: Once | INTRAMUSCULAR | Status: AC
  Administered 2011-11-11: 1 mg via INTRAVENOUS
  Filled 2011-11-11: qty 1

## 2011-11-11 MED ORDER — PANTOPRAZOLE SODIUM 40 MG PO TBEC: 40.0000 mg | DELAYED_RELEASE_TABLET | Freq: Every day | ORAL | Status: DC

## 2011-11-11 NOTE — ED Provider Notes (Signed)
Medical screening examination/treatment/procedure(s) were conducted as a shared visit with non-physician practitioner(s) and myself.  I personally evaluated the patient during the encounter.  Patient pain-free at 6:15 AM. Heart regular rate and rhythm in the 60s. Patient has been set up for stress echo in the a.m. to further evaluate her chest pain. She is low risk by TIMI, serial troponins negative. Patient states she had a negative stress test in the past and was told that she may have esophageal spasm. No primary care physician.  Sunnie Nielsen, MD 11/11/11 [phone]

## 2011-11-11 NOTE — ED Notes (Signed)
MD at bedside. 

## 2011-11-11 NOTE — Discharge Instructions (Signed)
Chest Pain Observation   It is often hard to give a specific diagnosis for the cause of chest pain. Your symptoms had a chance of being caused by inadequate oxygen delivery to your heart (angina). Angina that is not treated or evaluated can lead to a heart attack (myocardial infarction, MI) or death.   Blood tests, electrocardiograms, and X-rays may have been done to help determine a possible cause of your chest pain. After evaluation and observation, your caregiver has determined that it is unlikely your pain was caused by angina. However, a full evaluation of your pain needs to be completed. You need to follow up with caregivers or diagnostic testing as directed. It is very important to keep your follow-up appointments. Not keeping your follow-up appointments could result in permanent heart damage, disability, or death. If there is any problem keeping your follow-up appointments, you must call your caregiver.   HOME CARE INSTRUCTIONS   Due to the slight chance that your pain could be angina, it is important to follow healthy lifestyle habits and follow your caregiver's treatment plan:   Maintain a healthy weight.   Stay physically active and exercise regularly.   Decrease your salt intake.   Eat a diet low in saturated fats and cholesterol. Avoid foods fried in oil or made with fat. Talk to a dietician to learn about heart healthy foods.   Increase your fiber intake by including whole grains, vegetable, and fruits in your diet.   Avoid situations that cause stress, anger, or depression.   Take medication as advised by your caregiver. Report any side effects to your caregiver. Do not stop medications or adjust the dosages on your own.   Quit smoking. Do not use nicotine patches or gum until you check with your caregiver.   Keep your blood pressure, blood sugar, and cholesterol levels within normal limits.   Limit alcohol intake to no more than 1 drink per day for nonpregnant women and 2 drinks per day for men.    Stop abusing drugs.  SEEK MEDICAL CARE IF:   You have severe chest pain or pressure which may include symptoms such as:   Pain or pressure in the arms, neck, jaw, or back.   Profuse sweating.   Feeling sick to your stomach (nauseous).   Feeling short of breath while at rest.   Having a fast or irregular heartbeat.   You have chest pain that does not get better after rest or after taking your usual medicine.   You wake from sleep with chest pain.   You feel dizzy, faint, or experience extreme fatigue.   You notice increasing shortness of breath during rest, sleep, or with activity.   You are unable to sleep because you cannot breathe.   You develop a frequent cough or you are coughing up blood.   You have severe back or abdominal pain, are nauseated, or throw up (vomit).   You develop severe weakness, dizziness, fainting, or chills.  Any of these symptoms may represent a serious problem that is an emergency. Do not wait to see if the symptoms will go away. Call your local emergency services (911 in the [place] Do not drive yourself to the hospital.   MAKE SURE YOU:   Understand these instructions.   Will watch your condition.   Will get help right away if you are not doing well or get worse.

## 2011-11-11 NOTE — ED Notes (Signed)
Reevaluation of chest pain- states pain has not changed. Will notify physician. Will continue to monitor patient

## 2011-11-11 NOTE — ED Notes (Signed)
Complaining of chest pain. Similar to pain which brought her to hospital. Rates the pain as 4/10. Located center chest. Will continue to monitor patient

## 2011-11-24 ENCOUNTER — Encounter: Payer: Self-pay | Admitting: *Deleted

## 2011-11-25 ENCOUNTER — Encounter: Payer: Self-pay | Admitting: Cardiovascular Disease

## 2011-11-25 ENCOUNTER — Ambulatory Visit (INDEPENDENT_AMBULATORY_CARE_PROVIDER_SITE_OTHER): Payer: BC Managed Care – PPO | Admitting: Cardiovascular Disease

## 2011-11-25 VITALS — BP 118/76 | HR 72 | Ht 70.0 in | Wt 192.0 lb

## 2011-11-25 DIAGNOSIS — K219 Gastro-esophageal reflux disease without esophagitis: Secondary | ICD-10-CM

## 2011-11-25 DIAGNOSIS — R079 Chest pain, unspecified: Secondary | ICD-10-CM | POA: Insufficient documentation

## 2011-11-25 DIAGNOSIS — R030 Elevated blood-pressure reading, without diagnosis of hypertension: Secondary | ICD-10-CM | POA: Insufficient documentation

## 2011-11-25 DIAGNOSIS — R9431 Abnormal electrocardiogram [ECG] [EKG]: Secondary | ICD-10-CM

## 2011-11-25 NOTE — Assessment & Plan Note (Signed)
Atypical but minor changes on ECG  F/U stress echo r/o LVH or cardiomyopathy

## 2011-11-25 NOTE — Assessment & Plan Note (Signed)
Continue protonix and low carb diet with weight loss

## 2011-11-25 NOTE — Progress Notes (Signed)
Patient ID: Katherine Gregory, female   DOB: 1965/07/19, 46 y.o.   MRN: [id number] 46 year old nonsmoker, nondiabetic female with history of acid reflux. Referred for  intermittent central chest pain with radiation to back and left side of chest Seen in ER 7/3 and once prior to that  On 7/1 at [facility] med center.  Has no primary.  . Patient states her symptoms have persisted and are associated with dizziness, generalized weakness and nausea, she does have acid reflux. Taking Protonix without relief.. Reports constant chest pain and feeling dizzy and weak. No vomiting. No syncope. No falls. Reports home stressors. Denies illicit drugs use. No family history.  ECG in ER with mild anterolateral T wave changes.  BP also high.    ROS: Denies fever, malais, weight loss, blurry vision, decreased visual acuity, cough, sputum, SOB, hemoptysis, pleuritic pain, palpitaitons, heartburn, abdominal pain, melena, lower extremity edema, claudication, or rash.  All other systems reviewed and negative   General: Affect appropriate Healthy:  appears stated age HEENT: normal Neck supple with no adenopathy JVP normal no bruits no thyromegaly Lungs clear with no wheezing and good diaphragmatic motion Heart:  S1/S2 no murmur,rub, gallop or click PMI normal Abdomen: benighn, BS positve, no tenderness, no AAA no bruit.  No HSM or HJR Distal pulses intact with no bruits No edema Neuro non-focal Skin warm and dry No muscular weakness  Medications Current Outpatient Prescriptions  Medication Sig Dispense Refill  . cetirizine (ZYRTEC) 10 MG tablet Take 10 mg by mouth daily.      [name] omeprazole (PRILOSEC) 40 MG capsule Take 40 mg by mouth daily.        Allergies Ibuprofen; Morphine and related; Penicillins; and Pseudoephedrine  Family History: Family History  Problem Relation Age of Onset  . Hypertension Mother   . Huntington's disease Mother   . Diabetes Father   . Hypertension Father   . Scoliosis Sister   .  Diabetes Brother   . Hypertension Brother   . Scoliosis Brother     Social History: History   Social History  . Marital Status: Single    Spouse Name: N/A    Number of Children: N/A  . Years of Education: N/A   Occupational History  . Not on file.   Social History Main Topics  . Smoking status: Never Smoker   . Smokeless tobacco: Not on file  . Alcohol Use: 0.6 oz/week    1 Glasses of wine per week     occassional  . Drug Use: No  . Sexually Active: Yes    Birth Control/ Protection: Surgical   Other Topics Concern  . Not on file   Social History Narrative  . No narrative on file    Electrocardiogram:  73  SR persistant juvinile pattern with biphasic T waves precordial leads  Assessment and Plan

## 2011-11-25 NOTE — Assessment & Plan Note (Signed)
She will get a BP cuff.  Low sodium diet  Re check in 8-10 weeks  See what BP does during stress test

## 2011-11-25 NOTE — Patient Instructions (Signed)
Your physician recommends that you schedule a follow-up appointment in: 8 -10 WEEKS WITH DR [facility] Your physician recommends that you continue on your current medications as directed. Please refer to the Current Medication list given to you today. Your physician has requested that you have a stress echocardiogram. For further information please visit [email or web address] Please follow instruction sheet as given. DX CHEST PAIN ABN EKG

## 2011-12-10 ENCOUNTER — Other Ambulatory Visit (HOSPITAL_COMMUNITY): Payer: BC Managed Care – PPO

## 2012-01-24 ENCOUNTER — Encounter: Payer: Self-pay | Admitting: Cardiovascular Disease

## 2012-01-24 ENCOUNTER — Ambulatory Visit (INDEPENDENT_AMBULATORY_CARE_PROVIDER_SITE_OTHER): Payer: BC Managed Care – PPO | Admitting: Cardiovascular Disease

## 2012-01-24 VITALS — BP 115/83 | HR 86 | Ht 70.0 in | Wt 196.0 lb

## 2012-01-24 DIAGNOSIS — R079 Chest pain, unspecified: Secondary | ICD-10-CM

## 2012-01-24 DIAGNOSIS — E663 Overweight: Secondary | ICD-10-CM

## 2012-01-24 DIAGNOSIS — R030 Elevated blood-pressure reading, without diagnosis of hypertension: Secondary | ICD-10-CM

## 2012-01-24 DIAGNOSIS — R002 Palpitations: Secondary | ICD-10-CM

## 2012-01-24 NOTE — Progress Notes (Signed)
Patient ID: Katherine Gregory, female DOB: 27-Oct-1965, 46 y.o. MRN: [id number]  46 year old nonsmoker, nondiabetic female with history of acid reflux. Referred for intermittent central chest pain with radiation to back and left side of chest Seen in ER 7/3 and once prior to that On 7/1 at [facility]. Has no primary. . Patient states her symptoms have persisted and are associated with dizziness, generalized weakness and nausea, she does have acid reflux. Taking Protonix without relief.. Reports constant chest pain and feeling dizzy and weak. No vomiting. No syncope. No falls. Reports home stressors. Denies illicit drugs use. No family history. ECG in ER with mild anterolateral T wave changes. BP also high.    Since last visit BP better.  Home records reviewed and she does not need meds unless she gets HTN with exercise.  She will F/U this Friday for stress echo  ROS: Denies fever, malais, weight loss, blurry vision, decreased visual acuity, cough, sputum, SOB, hemoptysis, pleuritic pain, palpitaitons, heartburn, abdominal pain, melena, lower extremity edema, claudication, or rash.  All other systems reviewed and negative  General: Affect appropriate Healthy:  appears stated age HEENT: normal Neck supple with no adenopathy JVP normal no bruits no thyromegaly Lungs clear with no wheezing and good diaphragmatic motion Heart:  S1/S2 no murmur, no rub, gallop or click PMI normal Abdomen: benighn, BS positve, no tenderness, no AAA no bruit.  No HSM or HJR Distal pulses intact with no bruits No edema Neuro non-focal Skin warm and dry No muscular weakness   Current Outpatient Prescriptions  Medication Sig Dispense Refill  . cetirizine (ZYRTEC) 10 MG tablet Take 10 mg by mouth daily.      [name] omeprazole (PRILOSEC) 40 MG capsule Take 40 mg by mouth daily.        Allergies  Ibuprofen; Morphine and related; Penicillins; and Pseudoephedrine  Electrocardiogram:  11/13/11  SR rate 54  Lateral T wave  changes  Assessment and Plan

## 2012-01-24 NOTE — Assessment & Plan Note (Signed)
Discussed low carb Paleo/[employer] and benefits regarding GERD as well.

## 2012-01-24 NOTE — Patient Instructions (Addendum)
Your physician wants you to follow-up in:   6 MONTHS WITH DR Haywood Filler will receive a reminder letter in the mail two months in advance. If you don't receive a letter, please call our office to schedule the follow-up appointment. Your physician recommends that you continue on your current medications as directed. Please refer to the Current Medication list given to you today.  Your physician has requested that you have a stress echocardiogram. For further information please visit [email or web address] Please follow instruction sheet as given. DX CHEST PAIN

## 2012-01-24 NOTE — Assessment & Plan Note (Signed)
F/U stress echo given ECG abnormalities

## 2012-01-24 NOTE — Assessment & Plan Note (Signed)
Home readings are ok  See what BP response to exercise is like continue lifestyle modifications

## 2012-01-24 NOTE — Assessment & Plan Note (Signed)
Resolved benign  No need for monitor

## 2012-02-04 ENCOUNTER — Other Ambulatory Visit (HOSPITAL_COMMUNITY): Payer: BC Managed Care – PPO

## 2012-02-18 ENCOUNTER — Telehealth: Payer: Self-pay | Admitting: Cardiovascular Disease

## 2012-02-18 NOTE — Telephone Encounter (Signed)
Pt has elevated b/p 126/117 last night at a health fair and she wants to be seen today

## 2012-02-18 NOTE — Telephone Encounter (Signed)
SPOKE WITH  PT . PT'S B/P WAS NORMAL AT OFFICE VISIT   INSTRUCTED  PT  TO PURCHASE B/P CUFF AND  TO  KEEP B/P LOG AND TO CALL NEXT WEEK WITH  READINGS VERBALIZED UNDERSTANDING   ALSO  ASKED PT  ABOUT   NOT SHOWING UP  FOR  STRESS ECHO  PT HAD NO EXPLANATION AND  DID NOT  MENTION ANYTHING ABOUT RESCHEDULING TEST INFORMED PT TO  REESTABLISH WITH PMD   THEY CAN MANAGE  ANY B/P ISSUES the patient  VERBALIZED UNDERSTANDING WILL FORWARD TO DR Eden Emms FOR REVIEW./CY

## 2012-02-18 NOTE — Telephone Encounter (Signed)
LMTCB ./CY 

## 2012-02-21 ENCOUNTER — Ambulatory Visit: Payer: BC Managed Care – PPO | Admitting: Physician Assistant

## 2012-03-02 ENCOUNTER — Emergency Department (HOSPITAL_BASED_OUTPATIENT_CLINIC_OR_DEPARTMENT_OTHER)
Admission: EM | Admit: 2012-03-02 | Discharge: 2012-03-03 | Disposition: A | Discharge status: Home / Self Care | Payer: BC Managed Care – PPO | Attending: Emergency Medicine | Admitting: Emergency Medicine

## 2012-03-02 ENCOUNTER — Encounter (HOSPITAL_BASED_OUTPATIENT_CLINIC_OR_DEPARTMENT_OTHER): Payer: Self-pay | Admitting: *Deleted

## 2012-03-02 DIAGNOSIS — R002 Palpitations: Secondary | ICD-10-CM | POA: Insufficient documentation

## 2012-03-02 DIAGNOSIS — R5383 Other fatigue: Secondary | ICD-10-CM | POA: Insufficient documentation

## 2012-03-02 DIAGNOSIS — R51 Headache: Secondary | ICD-10-CM | POA: Insufficient documentation

## 2012-03-02 DIAGNOSIS — R110 Nausea: Secondary | ICD-10-CM | POA: Insufficient documentation

## 2012-03-02 DIAGNOSIS — R079 Chest pain, unspecified: Secondary | ICD-10-CM | POA: Insufficient documentation

## 2012-03-02 DIAGNOSIS — R0602 Shortness of breath: Secondary | ICD-10-CM | POA: Insufficient documentation

## 2012-03-02 DIAGNOSIS — R300 Dysuria: Secondary | ICD-10-CM | POA: Insufficient documentation

## 2012-03-02 DIAGNOSIS — K219 Gastro-esophageal reflux disease without esophagitis: Secondary | ICD-10-CM | POA: Insufficient documentation

## 2012-03-02 DIAGNOSIS — L509 Urticaria, unspecified: Secondary | ICD-10-CM | POA: Insufficient documentation

## 2012-03-02 DIAGNOSIS — L0291 Cutaneous abscess, unspecified: Secondary | ICD-10-CM | POA: Insufficient documentation

## 2012-03-02 DIAGNOSIS — L0390 Cellulitis, unspecified: Secondary | ICD-10-CM | POA: Insufficient documentation

## 2012-03-02 DIAGNOSIS — J309 Allergic rhinitis, unspecified: Secondary | ICD-10-CM | POA: Insufficient documentation

## 2012-03-02 DIAGNOSIS — R109 Unspecified abdominal pain: Secondary | ICD-10-CM | POA: Insufficient documentation

## 2012-03-02 DIAGNOSIS — R5381 Other malaise: Secondary | ICD-10-CM | POA: Insufficient documentation

## 2012-03-02 DIAGNOSIS — E049 Nontoxic goiter, unspecified: Secondary | ICD-10-CM | POA: Insufficient documentation

## 2012-03-02 DIAGNOSIS — R42 Dizziness and giddiness: Secondary | ICD-10-CM | POA: Insufficient documentation

## 2012-03-02 NOTE — ED Notes (Addendum)
Pt. States she had ant chest pain that started last night. States pain was constant last night,but today pain has been coming and going. Describes as a pressure type pain that radiates into her left arm. C/o feeling sob. C/o nausea denies vomiting. States 9 [address] drive about 3 weeks ago. resp even and unlabored. Denies fevers. Denies any heavy lifting or injury. State she does have seasonal allergies. Denies any cough/congestion at present. States CP is 2/10 at present. Feels better than yesterday.

## 2012-03-03 ENCOUNTER — Emergency Department (HOSPITAL_BASED_OUTPATIENT_CLINIC_OR_DEPARTMENT_OTHER): Payer: BC Managed Care – PPO

## 2012-03-03 MED ORDER — PANTOPRAZOLE SODIUM 40 MG IV SOLR
40.0000 mg | Freq: Once | INTRAVENOUS | Status: AC
  Administered 2012-03-03: 40 mg via INTRAVENOUS
  Filled 2012-03-03: qty 40

## 2012-03-03 MED ORDER — ACETAMINOPHEN 325 MG PO TABS
650.0000 mg | ORAL_TABLET | Freq: Once | ORAL | Status: AC
  Administered 2012-03-03: 650 mg via ORAL
  Filled 2012-03-03: qty 2

## 2012-03-03 NOTE — ED Notes (Signed)
Pt SR on monitor, denies CP at this time.

## 2012-03-03 NOTE — ED Provider Notes (Signed)
History     CSN: [id number]  Arrival date & time 03/02/12  2345   First MD Initiated Contact with Patient 03/03/12 0010      Chief Complaint  Patient presents with  . Chest Pain     Patient is a 46 y.o. female presenting with chest pain. The history is provided by the patient.  Chest Pain The chest pain began yesterday. Chest pain occurs constantly. The chest pain is unchanged. The severity of the pain is moderate. The quality of the pain is described as aching, burning and sharp. The pain radiates to the left shoulder. Exacerbated by: nothing. Primary symptoms include shortness of breath, nausea and dizziness. Pertinent negatives for primary symptoms include no fever, no fatigue, no syncope, no cough, no abdominal pain, no vomiting and no altered mental status.  Dizziness also occurs with nausea. Dizziness does not occur with vomiting or diaphoresis.   Pertinent negatives for associated symptoms include no diaphoresis. Treatments tried: rest.   PT reports that while at work yesterday, she noticed left sided CP.  She was not exerting herself or feeling anxious at the time.  She reports it first felt like burning pain and was present for several hours.  It somewhat improved but then return and now feels sharp.  It is not pleuritic.  It is not exertional.  She reports it radiates to left Ue.  No focal weakness reported No recent surgery.  No h/o CAD.  No h/o DVT/PE She reports she has had CP before but "not like this"   Past Medical History  Diagnosis Date  . Acid reflux   . Seasonal allergies   . Cellulitis   . GOITER   . URTICARIA   . FATIGUE   . Palpitations   . Dysuria   . FLANK PAIN, RIGHT   . HEADACHES, HX OF     Past Surgical History  Procedure Date  . Cesarean section 1988 & 2008  . Wisdom tooth extraction   . Cartilage surgery     RT wrist    Family History  Problem Relation Age of Onset  . Hypertension Mother   . Huntington's disease Mother   . Diabetes  Father   . Hypertension Father   . Scoliosis Sister   . Diabetes Brother   . Hypertension Brother   . Scoliosis Brother     History  Substance Use Topics  . Smoking status: Never Smoker   . Smokeless tobacco: Not on file  . Alcohol Use: 0.6 oz/week    1 Glasses of wine per week     occassional    OB History    Grav Para Term Preterm Abortions TAB SAB Ect Mult Living   4 2 2  2 1 1   2       Review of Systems  Constitutional: Negative for fever, diaphoresis and fatigue.  Respiratory: Positive for shortness of breath. Negative for cough.   Cardiovascular: Positive for chest pain. Negative for syncope.  Gastrointestinal: Positive for nausea. Negative for vomiting and abdominal pain.  Neurological: Positive for dizziness.  Psychiatric/Behavioral: Negative for altered mental status.  All other systems reviewed and are negative.    Allergies  Ibuprofen; Morphine and related; Penicillins; and Pseudoephedrine  Home Medications   Current Outpatient Rx  Name Route Sig Dispense Refill  . CETIRIZINE HCL 10 MG PO TABS Oral Take 10 mg by mouth as needed.     [name] OMEPRAZOLE 40 MG PO CPDR Oral Take 40 mg by mouth  as needed.       BP 132/82  Pulse 66  Temp 97.9 F (36.6 C) (Oral)  Resp 20  Ht 5\' 10"  (1.778 m)  Wt 185 lb (83.915 kg)  BMI 26.54 kg/m2  SpO2 100%  LMP 12/09/2011  Physical Exam CONSTITUTIONAL: Well developed/well nourished HEAD AND FACE: Normocephalic/atraumatic EYES: EOMI/PERRL ENMT: Mucous membranes moist NECK: supple no meningeal signs SPINE:entire spine nontender CV: S1/S2 noted, no murmurs/rubs/gallops noted Chest - no crepitance noted LUNGS: Lungs are clear to auscultation bilaterally, no apparent distress ABDOMEN: soft, nontender, no rebound or guarding GU:no cva tenderness NEURO: Pt is awake/alert, moves all extremitiesx4, no focal motor deficits EXTREMITIES: pulses normal, full ROM, no edema noted SKIN: warm, color normal PSYCH: no abnormalities  of mood noted  ED Course  Procedures  1:14 AM Pt well appearing.  EKG abnormal but unchanged.  Atypical story with "burning" now "sharp pain" but no pleuritic pain.  I doubt PE at this time.  Suspicion for ACS is low and has minimal risk factors.  She has seen cardiology as outpatient and scheduled to have f/u stress echo in the future.  Also, has had negative V/Q studies in past for PE.  She had recent ED OBS stay for Chest pain.  Will obtain CXR and reassess  2:19 AM Pt ambulatory in the ED She is in no distress She reports improvement Discussed strict return precautions including worsened pain, worsened short of breath/weakness/diaphoresis/syncope Pt understands instructions Stable for d/c  MDM  Nursing notes including past medical history and social history reviewed and considered in documentation Previous records reviewed and considered - seen by cardiology as outpatient, planned for outpatient stress xrays reviewed and considered        Date: 03/03/2012  Rate: 60  Rhythm: normal sinus rhythm  QRS Axis: normal  Intervals: normal  ST/T Wave abnormalities: nonspecific ST changes and inverted T waves  Conduction Disutrbances:none  Narrative Interpretation:   Old EKG Reviewed: unchanged    Joya Gaskins, MD 03/03/12 [phone]

## 2012-03-10 ENCOUNTER — Ambulatory Visit (INDEPENDENT_AMBULATORY_CARE_PROVIDER_SITE_OTHER): Payer: BC Managed Care – PPO | Admitting: Internal Medicine

## 2012-03-10 ENCOUNTER — Encounter: Payer: Self-pay | Admitting: Internal Medicine

## 2012-03-10 VITALS — BP 110/70 | HR 68 | Temp 98.0°F | Wt 200.0 lb

## 2012-03-10 DIAGNOSIS — K219 Gastro-esophageal reflux disease without esophagitis: Secondary | ICD-10-CM

## 2012-03-10 DIAGNOSIS — R002 Palpitations: Secondary | ICD-10-CM

## 2012-03-10 DIAGNOSIS — R5381 Other malaise: Secondary | ICD-10-CM

## 2012-03-10 DIAGNOSIS — IMO0001 Reserved for inherently not codable concepts without codable children: Secondary | ICD-10-CM

## 2012-03-10 DIAGNOSIS — M791 Myalgia, unspecified site: Secondary | ICD-10-CM

## 2012-03-10 DIAGNOSIS — Z0000 Encounter for general adult medical examination without abnormal findings: Secondary | ICD-10-CM

## 2012-03-10 DIAGNOSIS — R5383 Other fatigue: Secondary | ICD-10-CM

## 2012-03-10 DIAGNOSIS — N951 Menopausal and female climacteric states: Secondary | ICD-10-CM

## 2012-03-10 DIAGNOSIS — R079 Chest pain, unspecified: Secondary | ICD-10-CM

## 2012-03-10 DIAGNOSIS — Z23 Encounter for immunization: Secondary | ICD-10-CM

## 2012-03-10 DIAGNOSIS — R232 Flushing: Secondary | ICD-10-CM

## 2012-03-10 DIAGNOSIS — M797 Fibromyalgia: Secondary | ICD-10-CM

## 2012-03-10 LAB — BASIC METABOLIC PANEL
BUN: 9 mg/dL (ref 6–23)
CO2: 25 mEq/L (ref 19–32)
Calcium: 9 mg/dL (ref 8.4–10.5)
Chloride: 106 mEq/L (ref 96–112)
Creatinine, Ser: 1.1 mg/dL (ref 0.4–1.2)
GFR: 70.52 mL/min (ref >60.00)
Glucose, Bld: 89 mg/dL (ref 70–99)
Potassium: 4.5 mEq/L (ref 3.5–5.1)
Sodium: 139 mEq/L (ref 135–145)

## 2012-03-10 LAB — LIPID PANEL
Cholesterol: 191 mg/dL (ref 0–200)
HDL: 52.1 mg/dL (ref >39.00)
LDL Cholesterol: 109 mg/dL — ABNORMAL HIGH (ref 0–99)
Total CHOL/HDL Ratio: 4
Triglycerides: 150 mg/dL — ABNORMAL HIGH (ref 0.0–149.0)
VLDL: 30 mg/dL (ref 0.0–40.0)

## 2012-03-10 LAB — CBC WITH DIFFERENTIAL/PLATELET
Basophils Absolute: 0 10*3/uL (ref 0.0–0.1)
Basophils Relative: 0.6 % (ref 0.0–3.0)
Eosinophils Absolute: 0.1 10*3/uL (ref 0.0–0.7)
Eosinophils Relative: 0.9 % (ref 0.0–5.0)
HCT: 39.8 % (ref 36.0–46.0)
Hemoglobin: 12.9 g/dL (ref 12.0–15.0)
Lymphocytes Relative: 38.4 % (ref 12.0–46.0)
Lymphs Abs: 2.5 10*3/uL (ref 0.7–4.0)
MCHC: 32.5 g/dL (ref 30.0–36.0)
MCV: 91.3 fl (ref 78.0–100.0)
Monocytes Absolute: 0.7 10*3/uL (ref 0.1–1.0)
Monocytes Relative: 11.5 % (ref 3.0–12.0)
Neutro Abs: 3.1 10*3/uL (ref 1.4–7.7)
Neutrophils Relative %: 48.6 % (ref 43.0–77.0)
Platelets: 315 10*3/uL (ref 150.0–400.0)
RBC: 4.36 Mil/uL (ref 3.87–5.11)
RDW: 13.2 % (ref 11.5–14.6)
WBC: 6.4 10*3/uL (ref 4.5–10.5)

## 2012-03-10 LAB — HEPATIC FUNCTION PANEL
ALT: 18 U/L (ref 0–35)
AST: 18 U/L (ref 0–37)
Albumin: 3.5 g/dL (ref 3.5–5.2)
Alkaline Phosphatase: 80 U/L (ref 39–117)
Bilirubin, Direct: 0 mg/dL (ref 0.0–0.3)
Total Bilirubin: 0.6 mg/dL (ref 0.3–1.2)
Total Protein: 7.4 g/dL (ref 6.0–8.3)

## 2012-03-10 LAB — SEDIMENTATION RATE: Sed Rate: 33 mm/hr — ABNORMAL HIGH (ref 0–22)

## 2012-03-10 MED ORDER — AMITRIPTYLINE HCL 10 MG PO TABS: ORAL_TABLET | ORAL | Status: DC

## 2012-03-10 MED ORDER — PANTOPRAZOLE SODIUM 40 MG PO TBEC: 40.0000 mg | DELAYED_RELEASE_TABLET | Freq: Every day | ORAL | Status: DC

## 2012-03-10 NOTE — Assessment & Plan Note (Signed)
Patient having intermittent hot flashes. Her menstrual cycles are regular. Check FSH and LH. We discussed risks and benefits of hormone replacement therapy.

## 2012-03-10 NOTE — Assessment & Plan Note (Signed)
Patient having unexplained pains in her arms neck and feet. No evidence of inflammatory arthritis on exam. Check ANA and sedimentation rate. Patient's symptoms may be secondary to fibromyalgia. Trial of low-dose amitriptyline at bedtime.

## 2012-03-10 NOTE — Assessment & Plan Note (Signed)
Omeprazole causing constipation. Switch to protonix.

## 2012-03-10 NOTE — Assessment & Plan Note (Signed)
Stress echo planned for November 7th.

## 2012-03-10 NOTE — Progress Notes (Signed)
Subjective:    Patient ID: Katherine Gregory, female    DOB: 28-Sep-1965, 46 y.o.   MRN: [id number]  HPI  46 year old African American female with history of atypical chest pain palpitations to reestablish. Patient had recurrent symptoms in summer of 2013. She was seen by cardiologist. Stress echo rescheduled. Patient reports her chest symptoms have improved. She still has intermittent palpitations.  Patient also complains of intermittent hot flashes. She has having irregular menstrual cycles. She also has unexplained pains in her arms, back and feet.  She denies joint redness or edema.   Review of Systems   Constitutional: Negative for activity change, appetite change and unexpected weight change.  Eyes: Negative for visual disturbance.  Respiratory: Negative for cough and shortness of breath.   Cardiovascular: positive for chest pain.  Genitourinary: Negative for difficulty urinating.  Neurological: Negative for headaches.  Gastrointestinal: Negative for abdominal pain, heartburn melena or hematochezia Psych: Negative for depression or anxiety Endo - hot flashes, vaginal dryness  Past Medical History  Diagnosis Date  . Acid reflux   . Seasonal allergies   . Cellulitis   . GOITER   . URTICARIA   . FATIGUE   . Palpitations   . Dysuria   . FLANK PAIN, RIGHT   . HEADACHES, HX OF     History   Social History  . Marital Status: Single    Spouse Name: N/A    Number of Children: N/A  . Years of Education: N/A   Occupational History  . Not on file.   Social History Main Topics  . Smoking status: Never Smoker   . Smokeless tobacco: Not on file  . Alcohol Use: 0.6 oz/week    1 Glasses of wine per week     occassional  . Drug Use: No  . Sexually Active: Yes    Birth Control/ Protection: Surgical   Other Topics Concern  . Not on file   Social History Narrative  . No narrative on file    Past Surgical History  Procedure Date  . Cesarean section 1988 & 2008  .  Wisdom tooth extraction   . Cartilage surgery     RT wrist    Family History  Problem Relation Age of Onset  . Hypertension Mother   . Huntington's disease Mother   . Diabetes Father   . Hypertension Father   . Scoliosis Sister   . Diabetes Brother   . Hypertension Brother   . Scoliosis Brother     Allergies  Allergen Reactions  . Ibuprofen     "It intensifies my pain."  . Morphine And Related Itching and Other (See Comments)    Reaction burns  . Penicillins Hives and Itching  . Pseudoephedrine Other (See Comments)    Speed up heart rate    Current Outpatient Prescriptions on File Prior to Visit  Medication Sig Dispense Refill  . cetirizine (ZYRTEC) 10 MG tablet Take 10 mg by mouth as needed.       [name] amitriptyline (ELAVIL) 10 MG tablet 1/2 to one tablet at bedtime  30 tablet  2    BP 110/70  Pulse 68  Temp 98 F (36.7 C) (Oral)  Wt 200 lb (90.719 kg)  LMP 12/09/2011      Objective:   Physical Exam  Constitutional: She is oriented to person, place, and time. She appears well-developed and well-nourished. No distress.  HENT:  Head: Normocephalic and atraumatic.  Right Ear: External ear normal.  Left Ear: External  ear normal.  Mouth/Throat: Oropharynx is clear and moist.  Eyes: EOM are normal. Pupils are equal, round, and reactive to light.  Neck: Normal range of motion. Neck supple.       No carotid bruit  Cardiovascular: Normal rate, regular rhythm, normal heart sounds and intact distal pulses.   No murmur heard. Pulmonary/Chest: Effort normal and breath sounds normal. She has no wheezes. She has no rales.  Abdominal: Soft. Bowel sounds are normal. She exhibits no mass. There is no tenderness.  Musculoskeletal: She exhibits no edema.  Lymphadenopathy:    She has no cervical adenopathy.  Neurological: She is alert and oriented to person, place, and time. No cranial nerve deficit.  Skin: Skin is warm and dry.  Psychiatric: She has a normal mood and affect.  Her behavior is normal.          Assessment & Plan:

## 2012-03-13 LAB — ANA: Anti Nuclear Antibody(ANA): NEGATIVE

## 2012-03-13 LAB — FOLLICLE STIMULATING HORMONE: FSH: 4.4 m[IU]/mL

## 2012-03-13 LAB — LUTEINIZING HORMONE: LH: 4.41 m[IU]/mL

## 2012-03-13 LAB — T4, FREE: Free T4: 0.93 ng/dL (ref 0.60–1.60)

## 2012-03-13 LAB — TSH: TSH: 0.9 u[IU]/mL (ref 0.35–5.50)

## 2012-03-14 ENCOUNTER — Encounter (HOSPITAL_BASED_OUTPATIENT_CLINIC_OR_DEPARTMENT_OTHER): Payer: Self-pay

## 2012-03-14 ENCOUNTER — Emergency Department (HOSPITAL_BASED_OUTPATIENT_CLINIC_OR_DEPARTMENT_OTHER)
Admission: EM | Admit: 2012-03-14 | Discharge: 2012-03-14 | Disposition: A | Discharge status: Home / Self Care | Payer: BC Managed Care – PPO | Attending: Emergency Medicine | Admitting: Emergency Medicine

## 2012-03-14 ENCOUNTER — Emergency Department (HOSPITAL_BASED_OUTPATIENT_CLINIC_OR_DEPARTMENT_OTHER): Payer: BC Managed Care – PPO

## 2012-03-14 ENCOUNTER — Telehealth: Payer: Self-pay | Admitting: Internal Medicine

## 2012-03-14 DIAGNOSIS — K219 Gastro-esophageal reflux disease without esophagitis: Secondary | ICD-10-CM | POA: Insufficient documentation

## 2012-03-14 DIAGNOSIS — R0602 Shortness of breath: Secondary | ICD-10-CM | POA: Insufficient documentation

## 2012-03-14 DIAGNOSIS — R079 Chest pain, unspecified: Secondary | ICD-10-CM | POA: Insufficient documentation

## 2012-03-14 DIAGNOSIS — Z880 Allergy status to penicillin: Secondary | ICD-10-CM | POA: Insufficient documentation

## 2012-03-14 DIAGNOSIS — Z79899 Other long term (current) drug therapy: Secondary | ICD-10-CM | POA: Insufficient documentation

## 2012-03-14 LAB — COMPREHENSIVE METABOLIC PANEL
ALT: 13 U/L (ref 0–35)
AST: 15 U/L (ref 0–37)
Albumin: 3.2 g/dL — ABNORMAL LOW (ref 3.5–5.2)
Alkaline Phosphatase: 84 U/L (ref 39–117)
BUN: 8 mg/dL (ref 6–23)
CO2: 24 mEq/L (ref 19–32)
Calcium: 8.7 mg/dL (ref 8.4–10.5)
Chloride: 106 mEq/L (ref 96–112)
Creatinine, Ser: 1 mg/dL (ref 0.50–1.10)
GFR calc Af Amer: 78 mL/min — ABNORMAL LOW (ref >90)
GFR calc non Af Amer: 67 mL/min — ABNORMAL LOW (ref >90)
Glucose, Bld: 80 mg/dL (ref 70–99)
Potassium: 4.1 mEq/L (ref 3.5–5.1)
Sodium: 140 mEq/L (ref 135–145)
Total Bilirubin: 0.3 mg/dL (ref 0.3–1.2)
Total Protein: 6.9 g/dL (ref 6.0–8.3)

## 2012-03-14 LAB — CBC
HCT: 35.4 % — ABNORMAL LOW (ref 36.0–46.0)
Hemoglobin: 12 g/dL (ref 12.0–15.0)
MCH: 29.8 pg (ref 26.0–34.0)
MCHC: 33.9 g/dL (ref 30.0–36.0)
MCV: 87.8 fL (ref 78.0–100.0)
Platelets: 295 10*3/uL (ref 150–400)
RBC: 4.03 MIL/uL (ref 3.87–5.11)
RDW: 12.5 % (ref 11.5–15.5)
WBC: 5 10*3/uL (ref 4.0–10.5)

## 2012-03-14 LAB — TROPONIN I
Troponin I: <0.3 ng/mL (ref <0.30)
Troponin I: <0.3 ng/mL (ref <0.30)

## 2012-03-14 MED ORDER — NITROGLYCERIN 0.4 MG SL SUBL
0.4000 mg | SUBLINGUAL_TABLET | SUBLINGUAL | Status: AC | PRN
  Administered 2012-03-14 (×3): 0.4 mg via SUBLINGUAL
  Filled 2012-03-14: qty 25

## 2012-03-14 NOTE — ED Notes (Signed)
Pt reports returning chest pain, centralized, sharp in nature, rates it 3/10, denies n/v, +sob, o2 100 % ra, ekg in progress

## 2012-03-14 NOTE — ED Notes (Signed)
MD at bedside attempting blood draw 

## 2012-03-14 NOTE — ED Notes (Signed)
Unable to obtain blood specimen per RRT

## 2012-03-14 NOTE — ED Notes (Signed)
RRT at bedside for blood collection

## 2012-03-14 NOTE — ED Notes (Signed)
Pt returned from radiology.  States Chest Pain is a 6/10.

## 2012-03-14 NOTE — ED Notes (Signed)
Pt reports a 3 day history of midsternal chest pain that occasionally radiates to upper back and SHOB.

## 2012-03-14 NOTE — ED Notes (Signed)
Attempted SL x 1 unsuccessful.

## 2012-03-14 NOTE — ED Notes (Signed)
Patient transported to X-ray via stretcher 

## 2012-03-14 NOTE — ED Notes (Signed)
RT attempted twice to obtain blood from patient arterially but was unsuccessful. MD was notified. Patient tolerated the attempts well with no issues.

## 2012-03-14 NOTE — ED Notes (Signed)
Unable to collect blood.  PA-C informed and new order received for arterial stick.  Annabelle Harman, RRT informed.

## 2012-03-14 NOTE — ED Provider Notes (Signed)
History     CSN: [id number]  Arrival date & time 03/14/12  1710   First MD Initiated Contact with Patient 03/14/12 1720      Chief Complaint  Patient presents with  . Chest Pain    (Consider location/radiation/quality/duration/timing/severity/associated sxs/prior treatment) HPI Comments: Patient presents with complaint of chest pain and pressure which he has had multiple times in the past but has been worse in the past 2 days. Patient describes the pressure is intermittent in the mid chest. It is associated with shortness of breath and radiates to her back at times. Radiation to back is unusual for her. Most recent episode started approximately 2-1/2 hours prior to arrival. Patient called a triage nurse who told her to take aspirin and go to the emergency department. Patient has been seen in emergency department, by her primary care physician in the past month for the same. She has a cardiac stress test with [facility] scheduled for 11/7 (in 2 days). Risk factors are negative for hypertension, high cholesterol, smoking, diabetes. Patient had a father and grandmother who had heart attacks. She does not remember at what age they had heart attacks. Patient has not had any nausea or vomiting. Onset acute. Course is constant. Nothing makes it better. No PE risk factors including recent long car/plane rides, h/o DVT/PE, recent surgery, exogenous estrogens.   Patient is a 46 y.o. female presenting with chest pain. The history is provided by the patient.  Chest Pain Primary symptoms include shortness of breath. Pertinent negatives for primary symptoms include no fever, no cough, no palpitations, no abdominal pain, no nausea and no vomiting.  Pertinent negatives for associated symptoms include no diaphoresis.     Past Medical History  Diagnosis Date  . Acid reflux   . Seasonal allergies   . Cellulitis   . GOITER   . URTICARIA   . FATIGUE   . Palpitations   . Dysuria   . FLANK PAIN, RIGHT   .  HEADACHES, HX OF     Past Surgical History  Procedure Date  . Cesarean section 1988 & 2008  . Wisdom tooth extraction   . Cartilage surgery     RT wrist    Family History  Problem Relation Age of Onset  . Hypertension Mother   . Huntington's disease Mother   . Diabetes Father   . Hypertension Father   . Scoliosis Sister   . Diabetes Brother   . Hypertension Brother   . Scoliosis Brother     History  Substance Use Topics  . Smoking status: Never Smoker   . Smokeless tobacco: Not on file  . Alcohol Use: 0.6 oz/week    1 Glasses of wine per week     Comment: occassional    OB History    Grav Para Term Preterm Abortions TAB SAB Ect Mult Living   4 2 2  2 1 1   2       Review of Systems  Constitutional: Negative for fever and diaphoresis.  HENT: Negative for neck pain.   Eyes: Negative for redness.  Respiratory: Positive for shortness of breath. Negative for cough.   Cardiovascular: Positive for chest pain. Negative for palpitations and leg swelling.  Gastrointestinal: Negative for nausea, vomiting and abdominal pain.  Genitourinary: Negative for dysuria.  Musculoskeletal: Positive for back pain.  Skin: Negative for rash.  Neurological: Negative for syncope and light-headedness.    Allergies  Ibuprofen; Morphine and related; Penicillins; and Pseudoephedrine  Home Medications  Current Outpatient Rx  Name  Route  Sig  Dispense  Refill  . AMITRIPTYLINE HCL 10 MG PO TABS      1/2 to one tablet at bedtime   30 tablet   2   . CETIRIZINE HCL 10 MG PO TABS   Oral   Take 10 mg by mouth as needed.          . MULTI-VITAMIN/MINERALS PO TABS   Oral   Take 1 tablet by mouth daily.         [name] PANTOPRAZOLE SODIUM 40 MG PO TBEC   Oral   Take 1 tablet (40 mg total) by mouth daily.   90 tablet   1     BP 121/73  Pulse 70  Temp 97.7 F (36.5 C) (Oral)  Resp 20  Ht 5\' 10"  (1.778 m)  Wt 198 lb (89.812 kg)  BMI 28.41 kg/m2  SpO2 100%  LMP  03/14/2012  Physical Exam  Nursing note and vitals reviewed. Constitutional: She appears well-developed and well-nourished.  HENT:  Head: Normocephalic and atraumatic.  Mouth/Throat: Mucous membranes are normal. Mucous membranes are not dry.  Eyes: Conjunctivae normal are normal.  Neck: Trachea normal and normal range of motion. Neck supple. Normal carotid pulses and no JVD present. No muscular tenderness present. Carotid bruit is not present. No tracheal deviation present.  Cardiovascular: Normal rate, regular rhythm, S1 normal, S2 normal, normal heart sounds and intact distal pulses.  Exam reveals no decreased pulses.   No murmur heard. Pulmonary/Chest: Effort normal. No respiratory distress. She has no wheezes. She exhibits no tenderness.  Abdominal: Soft. Normal aorta and bowel sounds are normal. There is no tenderness. There is no rebound and no guarding.  Musculoskeletal: Normal range of motion. She exhibits no edema.       No LE edema.   Neurological: She is alert.  Skin: Skin is warm and dry. She is not diaphoretic. No cyanosis. No pallor.  Psychiatric: She has a normal mood and affect.    ED Course  Procedures (including critical care time)  Labs Reviewed  CBC - Abnormal; Notable for the following:    HCT 35.4 (*)     All other components within normal limits  COMPREHENSIVE METABOLIC PANEL - Abnormal; Notable for the following:    Albumin 3.2 (*)     GFR calc non Af Amer 67 (*)     GFR calc Af Amer 78 (*)     All other components within normal limits  TROPONIN I   Dg Chest 2 View  03/14/2012  *RADIOLOGY REPORT*  Clinical Data: Midsternal chest pain for 3 days  CHEST - 2 VIEW  Comparison: 03/03/2012; 11/10/2011  Findings: Unchanged cardiac silhouette and mediastinal contours. Improved inspiratory effort with persistent minimal perihilar opacities, right greater than left, likely atelectasis.  No new focal airspace opacities.  No pleural effusion or pneumothorax. Unchanged  bones.  IMPRESSION: No acute cardiopulmonary disease.   Original Report Authenticated By: Tacey Ruiz, MD      1. Chest pain     5:36 PM Patient seen and examined. Work-up initiated. Patient took 325mg  ASA PTA. EKG reviewed. Medications ordered. Pressure currently 7/10.   Vital signs reviewed and are as follows: Filed Vitals:   03/14/12 1718  BP: 121/73  Pulse: 70  Temp: 97.7 F (36.5 C)  Resp: 20    Date: 03/14/2012  Rate: 68  Rhythm: normal sinus rhythm  QRS Axis: normal  Intervals: normal  ST/T Wave abnormalities:  nonspecific T wave changes  Conduction Disutrbances:none  Narrative Interpretation:   Old EKG Reviewed: changes noted from 03/03/12, inverted t-waves now in V4,V5. Looking back at previous EKGs patient has had t-wave inversions in V4 and V5 previously.   7:13 PM Blood obtained by Dr. Anitra Lauth under ultrasound guidance.   9:35 PM Repeat EKG unchanged. Patient states she is feeling 'okay'. I spoke with Dr. Mayford Knife who will leave note for Dr. Eden Emms. Recc 2nd marker. Patient informed. 2nd trop planned for 2200 (3hr marker).    Date: 03/14/2012  Rate: 70  Rhythm: normal sinus rhythm  QRS Axis: normal  Intervals: normal  ST/T Wave abnormalities: nonspecific T wave changes  Conduction Disutrbances:none  Narrative Interpretation:   Old EKG Reviewed: unchanged  Patient was counseled to return with severe chest pain, especially if the pain is crushing or pressure-like and spreads to the arms, back, neck, or jaw, or if they have sweating, nausea, or shortness of breath with the pain. They were encouraged to call 911 with these symptoms.   They were also told to return if their chest pain gets worse and does not go away with rest, they have an attack of chest pain lasting longer than usual despite rest and treatment with the medications their caregiver has prescribed, if they wake from sleep with chest pain or shortness of breath, if they feel dizzy or faint, if they  have chest pain not typical of their usual pain, or if they have any other emergent concerns regarding their health.  The patient verbalized understanding and agreed.    10:36 PM 2nd troponin obtained, Dr. Anitra Lauth to watch for result and dispo as appropriate.     MDM  Pending 2nd marker, anticipate d/c to home with close cardiology f/u if neg.        Renne Crigler, [place] 03/14/12 2237

## 2012-03-14 NOTE — ED Notes (Signed)
[place]ie[place], PA-C at bedside for evaluation

## 2012-03-14 NOTE — ED Provider Notes (Signed)
Medical screening examination/treatment/procedure(s) were conducted as a shared visit with non-physician practitioner(s) and myself.  I personally evaluated the patient during the encounter Patient here complaining of chest pain. She's been seen in the emergency room multiple times has been placed in the chest pain protocol in the past has seen cardiology and has a stress test ordered it in 2 days. Her pain is atypical and has been ongoing now for 2 days.  EKG is unchanged from prior and labs today as well as 2 troponins are within normal limits. Spoke with cardiology and they recommended her following up  Gwyneth Sprout, MD 03/14/12 2343

## 2012-03-14 NOTE — Telephone Encounter (Signed)
Caller: Baileigh/Patient; Patient Name: Katherine Gregory; PCP: ; Janith Lima Phone Number: [phone]; Reason for call: Chest Pain/Chest Discomfort THE PATIENT REFUSED 911 Calling regarding chest pain and tightness that started 03/11/12, worse 03/14/12 and has been constant. She is at work now and wants to know what to do. Advised 911 as per Chest Pain protocol due to chest pain and tigthness for more than 5 minutes, she is compliant and has a 325 mg ASA on hand and will take now.

## 2012-03-14 NOTE — ED Notes (Signed)
Attempted blood draw unsuccessful in right forearm.

## 2012-03-16 ENCOUNTER — Encounter: Payer: Self-pay | Admitting: Cardiovascular Disease

## 2012-03-16 ENCOUNTER — Ambulatory Visit (HOSPITAL_COMMUNITY): Payer: BC Managed Care – PPO | Attending: Internal Medicine

## 2012-03-16 DIAGNOSIS — Z8249 Family history of ischemic heart disease and other diseases of the circulatory system: Secondary | ICD-10-CM | POA: Insufficient documentation

## 2012-03-16 DIAGNOSIS — R079 Chest pain, unspecified: Secondary | ICD-10-CM

## 2012-03-16 DIAGNOSIS — R42 Dizziness and giddiness: Secondary | ICD-10-CM | POA: Insufficient documentation

## 2012-03-16 DIAGNOSIS — R072 Precordial pain: Secondary | ICD-10-CM

## 2012-03-16 DIAGNOSIS — R002 Palpitations: Secondary | ICD-10-CM | POA: Insufficient documentation

## 2012-03-16 NOTE — Progress Notes (Signed)
Echocardiogram performed.  

## 2012-03-24 ENCOUNTER — Ambulatory Visit (INDEPENDENT_AMBULATORY_CARE_PROVIDER_SITE_OTHER): Payer: BC Managed Care – PPO | Admitting: Internal Medicine

## 2012-03-24 ENCOUNTER — Encounter: Payer: Self-pay | Admitting: Internal Medicine

## 2012-03-24 VITALS — BP 112/74 | HR 80 | Temp 98.4°F | Wt 204.0 lb

## 2012-03-24 DIAGNOSIS — IMO0001 Reserved for inherently not codable concepts without codable children: Secondary | ICD-10-CM

## 2012-03-24 DIAGNOSIS — N951 Menopausal and female climacteric states: Secondary | ICD-10-CM

## 2012-03-24 DIAGNOSIS — R079 Chest pain, unspecified: Secondary | ICD-10-CM

## 2012-03-24 DIAGNOSIS — R232 Flushing: Secondary | ICD-10-CM

## 2012-03-24 DIAGNOSIS — M797 Fibromyalgia: Secondary | ICD-10-CM

## 2012-03-24 MED ORDER — AMITRIPTYLINE HCL 10 MG PO TABS: 10.0000 mg | ORAL_TABLET | Freq: Every day | ORAL | Status: DC

## 2012-03-24 NOTE — Assessment & Plan Note (Signed)
LH and FSH were normal. Patient not having hot flashes on the basis of menopause.

## 2012-03-24 NOTE — Patient Instructions (Addendum)
OTC fish oil supplement twice daily

## 2012-03-24 NOTE — Progress Notes (Signed)
Subjective:    Patient ID: Katherine Gregory, female    DOB: 10/15/1965, 46 y.o.   MRN: [id number]  HPI  46 year old African American female for follow up regarding atypical chest pain or palpitations. Since previous visit patient was seen in ER for chest pain. Her cardiac enzyme was normal. She completed stress echocardiogram. She did not have any LV wall motion abnormalities, however patient noted to have abnormal ECG with stress. She has follow up with Dr. Eden Emms on December 2nd.  Patient still having unexplained pains in her arms back and feet. Her ANA was normal. Sedimentation rate was minimally elevated at 33. She had good response to amitriptyline 10 mg at bedtime.  Review of Systems Negative for shortness of breath  Past Medical History  Diagnosis Date  . Acid reflux   . Seasonal allergies   . Cellulitis   . GOITER   . URTICARIA   . FATIGUE   . Palpitations   . Dysuria   . FLANK PAIN, RIGHT   . HEADACHES, HX OF     History   Social History  . Marital Status: Single    Spouse Name: N/A    Number of Children: N/A  . Years of Education: N/A   Occupational History  . Not on file.   Social History Main Topics  . Smoking status: Never Smoker   . Smokeless tobacco: Not on file  . Alcohol Use: 0.6 oz/week    1 Glasses of wine per week     Comment: occassional  . Drug Use: No  . Sexually Active: Yes    Birth Control/ Protection: Surgical   Other Topics Concern  . Not on file   Social History Narrative  . No narrative on file    Past Surgical History  Procedure Date  . Cesarean section 1988 & 2008  . Wisdom tooth extraction   . Cartilage surgery     RT wrist    Family History  Problem Relation Age of Onset  . Hypertension Mother   . Huntington's disease Mother   . Diabetes Father   . Hypertension Father   . Scoliosis Sister   . Diabetes Brother   . Hypertension Brother   . Scoliosis Brother     Allergies  Allergen Reactions  . Ibuprofen    "It intensifies my pain."  . Morphine And Related Itching and Other (See Comments)    Reaction burns  . Penicillins Hives and Itching  . Pseudoephedrine Other (See Comments)    Speed up heart rate    Current Outpatient Prescriptions on File Prior to Visit  Medication Sig Dispense Refill  . cetirizine (ZYRTEC) 10 MG tablet Take 10 mg by mouth as needed.       . Multiple Vitamins-Minerals (MULTIVITAMIN WITH MINERALS) tablet Take 1 tablet by mouth daily.      . pantoprazole (PROTONIX) 40 MG tablet Take 1 tablet (40 mg total) by mouth daily.  90 tablet  1  . [DISCONTINUED] amitriptyline (ELAVIL) 10 MG tablet 1/2 to one tablet at bedtime  30 tablet  2    BP 112/74  Pulse 80  Temp 98.4 F (36.9 C) (Oral)  Wt 204 lb (92.534 kg)  LMP 03/14/2012       Objective:   Physical Exam  Constitutional: She appears well-developed and well-nourished.  HENT:  Head: Normocephalic and atraumatic.  Mouth/Throat: Oropharynx is clear and moist.       Mild oropharyngeal erythema  Cardiovascular: Normal rate, regular rhythm and  normal heart sounds.   Pulmonary/Chest: Effort normal and breath sounds normal. She has no wheezes.  Abdominal: Soft. Bowel sounds are normal.          Assessment & Plan:

## 2012-03-24 NOTE — Assessment & Plan Note (Signed)
Stress echo did not reveal any wall motion abnormalities however patient noted to have abnormal ECG response. She has follow up with cardiologist on 04/10/2012.

## 2012-03-24 NOTE — Assessment & Plan Note (Signed)
Good response to amitriptyline 10 mg at bedtime. Continue same dose. I encouraged regular exercise and sufficient sleep.

## 2012-03-29 ENCOUNTER — Telehealth: Payer: Self-pay | Admitting: Internal Medicine

## 2012-03-29 NOTE — Telephone Encounter (Signed)
We can discuss at next OV.  She can change to earlier if she wishes

## 2012-03-29 NOTE — Telephone Encounter (Signed)
Caller: Saraya/Patient; Phone: [phone]; Reason for Call: Called regarding recommendation for additional medication for fibromylagia.  Saw an informercial regarding unknown arthritis medication that may be helpful.  Reports unable to sleep at night and pain all day with fatigue.  Saw MD 03/24/12.  Still using Amitriptyline.  Caller disconnected before triage.  RN left message on anwering machine to call back for assistance.

## 2012-03-29 NOTE — Telephone Encounter (Signed)
Patient calling back, I gave her the message from MD.   Patient Information:  Caller Name: Katherine Gregory  Phone: [phone]  Patient: Katherine Gregory, Katherine Gregory  Gender: Female  DOB: [birth year]  Age: 46 Years  PCP: Artist Pais, Doe-Hyun Molly Maduro) (Adults only)  Pregnant: No   Symptoms  Reason For Call & Symptoms: new dx of fibromyalgia and having intermittent pain/body aches  Reviewed Health History In EMR: Yes  Reviewed Medications In EMR: Yes  Reviewed Allergies In EMR: Yes  Date of Onset of Symptoms: 03/26/2012 OB:  LMP: Unknown  Guideline(s) Used:  No Protocol Available - Information Only  Disposition Per Guideline:   Home Care  Reason For Disposition Reached:   Information only question and nurse able to answer  Advice Given:  N/A  Office Follow Up:  Does the office need to follow up with this patient?: Yes  Instructions For The Office: Please call to reschedule her OV to an earlier date per instructions from Dr. Artist Pais.  RN Note:  I gave her the message in EPIC from Dr. Artist Pais.  She would like to come in before the 12/2 scheduled appt.  She is off this Friday and would like to have an afternoon appt.  PLEASE CALL to schedule.

## 2012-03-29 NOTE — Telephone Encounter (Signed)
appt scheduled for 12/6 at 3.  Pt wanted to know what else she can do for the pain.  She is taking amitriptyline qhs.  Per Dr Artist Pais she can take Cymbalta 30 mg once daily.  Samples up front for pt to p/u, pt aware

## 2012-03-30 ENCOUNTER — Telehealth: Payer: Self-pay | Admitting: Internal Medicine

## 2012-03-30 NOTE — Telephone Encounter (Signed)
Pt needs to know instructions for taking the Cymbalta 30 mg. Pt has taking 1 so far. Pls call.

## 2012-03-30 NOTE — Telephone Encounter (Signed)
Told pt once daily

## 2012-04-10 ENCOUNTER — Ambulatory Visit: Payer: BC Managed Care – PPO | Admitting: Cardiovascular Disease

## 2012-04-14 ENCOUNTER — Ambulatory Visit (INDEPENDENT_AMBULATORY_CARE_PROVIDER_SITE_OTHER): Payer: BC Managed Care – PPO | Admitting: Internal Medicine

## 2012-04-14 ENCOUNTER — Encounter: Payer: Self-pay | Admitting: Internal Medicine

## 2012-04-14 VITALS — BP 122/80 | HR 72 | Temp 98.0°F | Wt 202.0 lb

## 2012-04-14 DIAGNOSIS — IMO0001 Reserved for inherently not codable concepts without codable children: Secondary | ICD-10-CM

## 2012-04-14 DIAGNOSIS — M797 Fibromyalgia: Secondary | ICD-10-CM

## 2012-04-14 MED ORDER — DULOXETINE HCL 60 MG PO CPEP: 60.0000 mg | ORAL_CAPSULE | Freq: Every day | ORAL | Status: DC

## 2012-04-14 MED ORDER — ZOLPIDEM TARTRATE 5 MG PO TABS: 5.0000 mg | ORAL_TABLET | Freq: Every evening | ORAL | Status: DC | PRN

## 2012-04-14 NOTE — Progress Notes (Signed)
Subjective:    Patient ID: Katherine Gregory, female    DOB: 1965-10-23, 46 y.o.   MRN: [id number]  HPI  46 year old [place] American female for followup regarding fibromyalgia. Patient previously started on Cymbalta 30 mg. She is tolerating well. She denies any adverse effects. She gets some improvement in her pain symptoms but only lasts for several hours.  Review of Systems Negative for nausea.  Chronic insomnia  Past Medical History  Diagnosis Date  . Acid reflux   . Seasonal allergies   . Cellulitis   . GOITER   . URTICARIA   . FATIGUE   . Palpitations   . Dysuria   . FLANK PAIN, RIGHT   . HEADACHES, HX OF     History   Social History  . Marital Status: Single    Spouse Name: N/A    Number of Children: N/A  . Years of Education: N/A   Occupational History  . Not on file.   Social History Main Topics  . Smoking status: Never Smoker   . Smokeless tobacco: Not on file  . Alcohol Use: 0.6 oz/week    1 Glasses of wine per week     Comment: occassional  . Drug Use: No  . Sexually Active: Yes    Birth Control/ Protection: Surgical   Other Topics Concern  . Not on file   Social History Narrative  . No narrative on file    Past Surgical History  Procedure Date  . Cesarean section 1988 & 2008  . Wisdom tooth extraction   . Cartilage surgery     RT wrist    Family History  Problem Relation Age of Onset  . Hypertension Mother   . Huntington's disease Mother   . Diabetes Father   . Hypertension Father   . Scoliosis Sister   . Diabetes Brother   . Hypertension Brother   . Scoliosis Brother     Allergies  Allergen Reactions  . Ibuprofen     "It intensifies my pain."  . Morphine And Related Itching and Other (See Comments)    Reaction burns  . Penicillins Hives and Itching  . Pseudoephedrine Other (See Comments)    Speed up heart rate    Current Outpatient Prescriptions on File Prior to Visit  Medication Sig Dispense Refill  . cetirizine  (ZYRTEC) 10 MG tablet Take 10 mg by mouth as needed.       . DULoxetine (CYMBALTA) 60 MG capsule Take 1 capsule (60 mg total) by mouth daily.  90 capsule  1  . Multiple Vitamins-Minerals (MULTIVITAMIN WITH MINERALS) tablet Take 1 tablet by mouth daily.      . pantoprazole (PROTONIX) 40 MG tablet Take 1 tablet (40 mg total) by mouth daily.  90 tablet  1  . zolpidem (AMBIEN) 5 MG tablet Take 1 tablet (5 mg total) by mouth at bedtime as needed for sleep.  30 tablet  2    BP 122/80  Pulse 72  Temp 98 F (36.7 C) (Oral)  Wt 202 lb (91.627 kg)       Objective:   Physical Exam  Constitutional: She is oriented to person, place, and time. She appears well-developed and well-nourished.  Cardiovascular: Normal rate, regular rhythm and normal heart sounds.   Pulmonary/Chest: Effort normal and breath sounds normal. She has no wheezes.  Neurological: She is alert and oriented to person, place, and time.  Psychiatric: She has a normal mood and affect. Her behavior is normal.  Assessment & Plan:

## 2012-04-14 NOTE — Assessment & Plan Note (Signed)
Patient experiencing persistent symptoms despite amitriptyline 10 mg at bedtime. Discontinue amitriptyline. Patient started on Cymbalta 30 mg. She's had partial response. Titrate to 60 mg. Reassess in one month.

## 2012-04-19 ENCOUNTER — Emergency Department (HOSPITAL_COMMUNITY)
Admission: EM | Admit: 2012-04-19 | Discharge: 2012-04-19 | Disposition: A | Discharge status: Home / Self Care | Payer: BC Managed Care – PPO | Attending: Emergency Medicine | Admitting: Emergency Medicine

## 2012-04-19 ENCOUNTER — Emergency Department (HOSPITAL_COMMUNITY): Payer: BC Managed Care – PPO

## 2012-04-19 ENCOUNTER — Encounter (HOSPITAL_COMMUNITY): Payer: Self-pay | Admitting: *Deleted

## 2012-04-19 ENCOUNTER — Telehealth: Payer: Self-pay | Admitting: Internal Medicine

## 2012-04-19 DIAGNOSIS — R5381 Other malaise: Secondary | ICD-10-CM | POA: Insufficient documentation

## 2012-04-19 DIAGNOSIS — Z8679 Personal history of other diseases of the circulatory system: Secondary | ICD-10-CM | POA: Insufficient documentation

## 2012-04-19 DIAGNOSIS — R0789 Other chest pain: Secondary | ICD-10-CM | POA: Insufficient documentation

## 2012-04-19 DIAGNOSIS — K219 Gastro-esophageal reflux disease without esophagitis: Secondary | ICD-10-CM | POA: Insufficient documentation

## 2012-04-19 DIAGNOSIS — R5383 Other fatigue: Secondary | ICD-10-CM | POA: Insufficient documentation

## 2012-04-19 DIAGNOSIS — Z862 Personal history of diseases of the blood and blood-forming organs and certain disorders involving the immune mechanism: Secondary | ICD-10-CM | POA: Insufficient documentation

## 2012-04-19 DIAGNOSIS — R079 Chest pain, unspecified: Secondary | ICD-10-CM

## 2012-04-19 DIAGNOSIS — Z8639 Personal history of other endocrine, nutritional and metabolic disease: Secondary | ICD-10-CM | POA: Insufficient documentation

## 2012-04-19 LAB — BASIC METABOLIC PANEL
BUN: 12 mg/dL (ref 6–23)
CO2: 25 mEq/L (ref 19–32)
Calcium: 9.5 mg/dL (ref 8.4–10.5)
Chloride: 105 mEq/L (ref 96–112)
Creatinine, Ser: 1.04 mg/dL (ref 0.50–1.10)
GFR calc Af Amer: 74 mL/min — ABNORMAL LOW (ref >90)
GFR calc non Af Amer: 64 mL/min — ABNORMAL LOW (ref >90)
Glucose, Bld: 85 mg/dL (ref 70–99)
Potassium: 4.2 mEq/L (ref 3.5–5.1)
Sodium: 138 mEq/L (ref 135–145)

## 2012-04-19 LAB — URINALYSIS, ROUTINE W REFLEX MICROSCOPIC
Bilirubin Urine: NEGATIVE
Glucose, UA: NEGATIVE mg/dL
Hgb urine dipstick: NEGATIVE
Ketones, ur: NEGATIVE mg/dL
Nitrite: NEGATIVE
Protein, ur: NEGATIVE mg/dL
Specific Gravity, Urine: 1.031 — ABNORMAL HIGH (ref 1.005–1.030)
Urobilinogen, UA: 1 mg/dL (ref 0.0–1.0)
pH: 6.5 (ref 5.0–8.0)

## 2012-04-19 LAB — CBC
HCT: 40.2 % (ref 36.0–46.0)
Hemoglobin: 13.8 g/dL (ref 12.0–15.0)
MCH: 29.9 pg (ref 26.0–34.0)
MCHC: 34.3 g/dL (ref 30.0–36.0)
MCV: 87 fL (ref 78.0–100.0)
Platelets: 296 10*3/uL (ref 150–400)
RBC: 4.62 MIL/uL (ref 3.87–5.11)
RDW: 12.1 % (ref 11.5–15.5)
WBC: 5.3 10*3/uL (ref 4.0–10.5)

## 2012-04-19 LAB — URINE MICROSCOPIC-ADD ON

## 2012-04-19 LAB — TROPONIN I: Troponin I: <0.3 ng/mL (ref <0.30)

## 2012-04-19 MED ORDER — ASPIRIN 81 MG PO CHEW
324.0000 mg | CHEWABLE_TABLET | Freq: Once | ORAL | Status: AC
  Administered 2012-04-19: 324 mg via ORAL
  Filled 2012-04-19: qty 4

## 2012-04-19 NOTE — ED Notes (Signed)
Patient was seen here on last Friday with chest pain- negative workup but patient states "started having chest discomfort again 3 days prior to today".  Patient was diagnosed with fibromyalgia by PCP and was told that could be causing her pain.  She complains of weakness today as well. Katherine Gregory

## 2012-04-19 NOTE — ED Provider Notes (Signed)
1:41 PM  Date: 04/19/2012  Rate: 70  Rhythm: normal sinus rhythm  QRS Axis: normal  Intervals: normal PQRS:  Left atrial abnormality  ST/T Wave abnormalities: Inverted T waves in inferior and lateral leads  Conduction Disutrbances:none  Narrative Interpretation: Abnormal EKG  Old EKG Reviewed: unchanged    Carleene Cooper III, MD 04/19/12 1343

## 2012-04-19 NOTE — Telephone Encounter (Signed)
Patient Information:  Caller Name: Katherine Gregory  Phone: [phone]  Patient: Katherine Gregory, Katherine Gregory  Gender: Female  DOB: 09-Nov-1965  Age: 46 Years  PCP: Katherine Gregory, Katherine Gregory) (Adults only)  Pregnant: No  Office Follow Up:  Does the office need to follow up with this patient?: No  Instructions For The Office: N/A  RN Note:  Patient reports that she will be sending in a form to be filled out for disability due to having to miss work so often with medical problems. Advised patient to hang up and dial 911 at this time so that paramedics can evaluate her as quickly as possible. Patient verbalized understanding.  Symptoms  Reason For Call & Symptoms: Chest pain > 5 minutes in the last hour. History of fibromyalgia. Pain when taking deep breaths at times, pain when talking at times.  Reviewed Health History In EMR: Yes  Reviewed Medications In EMR: Yes  Reviewed Allergies In EMR: Yes  Reviewed Surgeries / Procedures: Yes  Date of Onset of Symptoms: 04/14/2012  Treatments Tried: Was seen in the ED for chest pain on 04/14/12, EKG at that time was normal. Has not taken anything since ED visit for the pain.  Treatments Tried Worked: No OB:  LMP: Unknown  Guideline(s) Used:  Chest Pain  Disposition Per Guideline:   Call EMS 911 Now  Reason For Disposition Reached:   Chest pain lasting longer than 5 minutes and ANY of the following:  Over 75 years old Over 1 years old and at least one cardiac risk factor (i.e., high blood pressure, diabetes, high cholesterol, obesity, smoker or strong family history of heart disease) Pain is crushing, pressure-like, or heavy  Took nitroglycerin and chest pain was not relieved History of heart disease (i.e., angina, heart attack, bypass surgery, angioplasty, CHF)  Advice Given:  Advised patient to hang up and dial 911 at this time so that paramedics can assess her as quickly as possible. Patient reports she will be sending in a form to be filled out regarding  disability due to having to miss so much work for medical reasons.

## 2012-04-19 NOTE — ED Provider Notes (Signed)
History    46-year-old female with chest pain. Onset Friday and persistent since. Pressure in the center of her chest. No appreciable exacerbating relieving factors. Patient with a history of similar pain. Previous emergency room, primary care and cardiology evaluations for the same. Patient states that on recent evaluation by PCP felt that pain may be related to fibromyalgia. No unusual leg pain or swelling. Denies history of blood clot. No cough. No fever or chills. No dyspnea.  CSN: [id number]  Arrival date & time 04/19/12  1316   First MD Initiated Contact with Patient 04/19/12 1546      Chief Complaint  Patient presents with  . Weakness    (Consider location/radiation/quality/duration/timing/severity/associated sxs/prior treatment) HPI  Past Medical History  Diagnosis Date  . Acid reflux   . Seasonal allergies   . Cellulitis   . GOITER   . URTICARIA   . FATIGUE   . Palpitations   . Dysuria   . FLANK PAIN, RIGHT   . HEADACHES, HX OF     Past Surgical History  Procedure Date  . Cesarean section 1988 & 2008  . Wisdom tooth extraction   . Cartilage surgery     RT wrist    Family History  Problem Relation Age of Onset  . Hypertension Mother   . Huntington's disease Mother   . Diabetes Father   . Hypertension Father   . Scoliosis Sister   . Diabetes Brother   . Hypertension Brother   . Scoliosis Brother     History  Substance Use Topics  . Smoking status: Never Smoker   . Smokeless tobacco: Not on file  . Alcohol Use: 0.6 oz/week    1 Glasses of wine per week     Comment: occassional    OB History    Grav Para Term Preterm Abortions TAB SAB Ect Mult Living   4 2 2  2 1 1   2       Review of Systems   Review of symptoms negative unless otherwise noted in HPI.   Allergies  Ibuprofen; Morphine and related; Penicillins; and Pseudoephedrine  Home Medications   Current Outpatient Rx  Name  Route  Sig  Dispense  Refill  . CETIRIZINE HCL 10 MG PO  TABS   Oral   Take 10 mg by mouth daily as needed. For allergies         . DULOXETINE HCL 60 MG PO CPEP   Oral   Take 1 capsule (60 mg total) by mouth daily.   90 capsule   1   . OMEGA-3 FATTY ACIDS 1000 MG PO CAPS   Oral   Take 1 g by mouth 3 (three) times a week.          . MULTI-VITAMIN/MINERALS PO TABS   Oral   Take 1 tablet by mouth daily.         [name] PANTOPRAZOLE SODIUM 40 MG PO TBEC   Oral   Take 1 tablet (40 mg total) by mouth daily.   90 tablet   1   . ZOLPIDEM TARTRATE 5 MG PO TABS   Oral   Take 1 tablet (5 mg total) by mouth at bedtime as needed for sleep.   30 tablet   2     BP 126/84  Temp 98 F (36.7 C) (Oral)  Resp 23  SpO2 100%  Physical Exam  Nursing note and vitals reviewed. Constitutional: She appears well-developed and well-nourished. No distress.  Laying in bed. No acute distress. Obese.  HENT:  Head: Normocephalic and atraumatic.  Eyes: Conjunctivae normal are normal. Right eye exhibits no discharge. Left eye exhibits no discharge.  Neck: Neck supple.  Cardiovascular: Normal rate, regular rhythm and normal heart sounds.  Exam reveals no gallop and no friction rub.   No murmur heard. Pulmonary/Chest: Effort normal and breath sounds normal. No respiratory distress.       Anterior chest wall normal to inspection.  Abdominal: Soft. She exhibits no distension. There is no tenderness.  Musculoskeletal: She exhibits no edema and no tenderness.       Lower extremities symmetric as compared to each other. No calf tenderness. Negative Homan's. No palpable cords.   Neurological: She is alert.  Skin: Skin is warm and dry. She is not diaphoretic.  Psychiatric: She has a normal mood and affect. Her behavior is normal. Thought content normal.    ED Course  Procedures (including critical care time)  Labs Reviewed  BASIC METABOLIC PANEL - Abnormal; Notable for the following:    GFR calc non Af Amer 64 (*)     GFR calc Af Amer 74 (*)      All other components within normal limits  URINALYSIS, ROUTINE W REFLEX MICROSCOPIC - Abnormal; Notable for the following:    Color, Urine AMBER (*)  BIOCHEMICALS MAY BE AFFECTED BY COLOR   APPearance HAZY (*)     Specific Gravity, Urine 1.031 (*)     Leukocytes, UA SMALL (*)     All other components within normal limits  URINE MICROSCOPIC-ADD ON - Abnormal; Notable for the following:    Squamous Epithelial / LPF FEW (*)     Bacteria, UA FEW (*)     All other components within normal limits  CBC  TROPONIN I  URINE CULTURE   Dg Chest 2 View  04/19/2012  *RADIOLOGY REPORT*  Clinical Data: Chest pain.  CHEST - 2 VIEW  Comparison: March 14, 2012.  Findings: Cardiomediastinal silhouette appears normal.  No acute pulmonary disease is noted.  Bony thorax is intact.  IMPRESSION: No acute cardiopulmonary abnormality seen.   Original Report Authenticated By: Lupita Raider.,  M.D.      1. Chest pain       MDM  46 year old female chest pain. Very atypical for ACS given constant duration for 5 days. EKG does have T wave inversions inferior and laterally, but this was noted on previous. Troponin is normal. Chest x-ray is clear. Do not think this is ACS. Doubt pulmonary bullous. Doubt aortic dissection. Doubt infectious. Feel she is safe for discharge at this time. Return precautions were discussed. Outpatient followup with her PCP otherwise the        Raeford Razor, MD 04/19/12 1843

## 2012-04-19 NOTE — Telephone Encounter (Signed)
Sent to ED - encounter closed.

## 2012-04-21 ENCOUNTER — Encounter: Payer: Self-pay | Admitting: Internal Medicine

## 2012-04-21 ENCOUNTER — Telehealth: Payer: Self-pay | Admitting: Internal Medicine

## 2012-04-21 ENCOUNTER — Ambulatory Visit (INDEPENDENT_AMBULATORY_CARE_PROVIDER_SITE_OTHER): Payer: BC Managed Care – PPO | Admitting: Internal Medicine

## 2012-04-21 VITALS — BP 120/80 | HR 80 | Temp 97.6°F

## 2012-04-21 DIAGNOSIS — R079 Chest pain, unspecified: Secondary | ICD-10-CM

## 2012-04-21 DIAGNOSIS — R0789 Other chest pain: Secondary | ICD-10-CM

## 2012-04-21 MED ORDER — SUCRALFATE 1 G PO TABS: 1.0000 g | ORAL_TABLET | Freq: Three times a day (TID) | ORAL | Status: DC

## 2012-04-21 MED ORDER — ESOMEPRAZOLE MAGNESIUM 40 MG PO CPDR: 40.0000 mg | DELAYED_RELEASE_CAPSULE | Freq: Two times a day (BID) | ORAL | Status: DC

## 2012-04-21 NOTE — Assessment & Plan Note (Signed)
46 year old [place] American female with atypical chest pain. Her symptoms started on Friday. She describes sharp sensation that is now dull and constant.  She has epigastric tenderness. I suspect gastrointestinal etiology.  However she does have slight EKG changes -  she has inverted T waves in T6 and II compared to EKG of 03/14/2012. EKG was reviewed with Dr. Antoine Poche. Given her atypical symptoms and her recent normal stress echo, we recommend followup with Dr. Eden Emms as outpatient to consider further cardiac evaluation.  Arrange GI referral for possible EGD.   Start carafate and take PPI twice daily.  Reassess in 1 week.  Patient advised to call office if symptoms worsen.

## 2012-04-21 NOTE — Telephone Encounter (Signed)
Patient Information:  Caller Name: Airyanna  Phone: [phone]  Patient: Katherine Gregory, Katherine Gregory  Gender: Female  DOB: [birth year]  Age: 46 Years  PCP: Artist Pais, Doe-Hyun Molly Maduro) (Adults only)  Pregnant: No  Office Follow Up:  Does the office need to follow up with this patient?: Yes  Instructions For The Office: ask if patient can be seen in the office instead of ED  RN Note:  Very soft spoken lady.  Has not taken Tylenol for the pain.  Triaged and needs to be seen in ED or office with MD permission.  Note to office.  Symptoms  Reason For Call & Symptoms: chest pain for  a week, constant; denies any cold sx .  Has been to ED x 2, last visit 12/11.  Pain is a dull ache and pressure  Reviewed Health History In EMR: Yes  Reviewed Medications In EMR: Yes  Reviewed Allergies In EMR: Yes  Reviewed Surgeries / Procedures: Yes  Date of Onset of Symptoms: 04/14/2012  Treatments Tried: has been to ED x 2 for same  Treatments Tried Worked: No OB:  LMP: 04/13/2012  Guideline(s) Used:  Chest Pain  Disposition Per Guideline:   Go to ED Now (or to Office with PCP Approval)  Reason For Disposition Reached:   Chest pain lasting longer than 5 minutes  Advice Given:  N/A  RN Overrode Recommendation:  Make Appointment  please see if patient can have an OV today

## 2012-04-21 NOTE — Telephone Encounter (Signed)
Per Alfred Levins, pt placed on scheduled on 04/21/12 @ 2:30 for hospital follow up.

## 2012-04-21 NOTE — Progress Notes (Signed)
Subjective:    Patient ID: Katherine Gregory, female    DOB: June 02, 1965, 46 y.o.   MRN: [id number]  HPI  46 year old [place] American female with history of fibromyalgia, palpitations and borderline hypertension for emergency room followup. Patient was seen on 04/19/2012 for acute chest pain. Symptoms started at work while she was sitting at her desk. She experienced sharp sensation above her epigastric area. Patient reports she waited a while and hoped that symptoms would subside. She later picked up several rolls of canvas which were not particularly heavy .  After picking up canvas rolls, patient experienced worsening severe sharp chest pain.  Her coworkers noticed patient was in distress and called 911. She was taken to the ER for further evaluation. Chest x-ray was negative. Cardiac enzyme was normal.   Patient reports her chest pain is constant and is now describes a dull ache. She also notes that she feels that she can't take a deep breath. Her symptoms do radiate to the back and left shoulder. EKG was repeated in ER. It showed normal sinus rhythm at 70 beats per minute. She has inverted T waves in inferolateral leads that are similar to previous EKG.  T wave inversion greater in lead V6 compared to EKG of 03/14/12.  Discussed EKG findings with Dr. Antoine Poche.   ER records reviewed.   Review of Systems Negative for NSAID use.  Poor po intake for last 1-2 days  Past Medical History  Diagnosis Date  . Acid reflux   . Seasonal allergies   . Cellulitis   . GOITER   . URTICARIA   . FATIGUE   . Palpitations   . Dysuria   . FLANK PAIN, RIGHT   . HEADACHES, HX OF     History   Social History  . Marital Status: Single    Spouse Name: N/A    Number of Children: N/A  . Years of Education: N/A   Occupational History  . Not on file.   Social History Main Topics  . Smoking status: Never Smoker   . Smokeless tobacco: Not on file  . Alcohol Use: 0.6 oz/week    1 Glasses of wine per  week     Comment: occassional  . Drug Use: No  . Sexually Active: Yes    Birth Control/ Protection: Surgical   Other Topics Concern  . Not on file   Social History Narrative  . No narrative on file    Past Surgical History  Procedure Date  . Cesarean section 1988 & 2008  . Wisdom tooth extraction   . Cartilage surgery     RT wrist    Family History  Problem Relation Age of Onset  . Hypertension Mother   . Huntington's disease Mother   . Diabetes Father   . Hypertension Father   . Scoliosis Sister   . Diabetes Brother   . Hypertension Brother   . Scoliosis Brother     Allergies  Allergen Reactions  . Ibuprofen     "It intensifies my pain."  . Morphine And Related Itching and Other (See Comments)    Reaction burns  . Penicillins Hives and Itching  . Pseudoephedrine Other (See Comments)    Speed up heart rate    Current Outpatient Prescriptions on File Prior to Visit  Medication Sig Dispense Refill  . cetirizine (ZYRTEC) 10 MG tablet Take 10 mg by mouth daily as needed. For allergies      . DULoxetine (CYMBALTA) 60 MG capsule Take  1 capsule (60 mg total) by mouth daily.  90 capsule  1  . zolpidem (AMBIEN) 5 MG tablet Take 1 tablet (5 mg total) by mouth at bedtime as needed for sleep.  30 tablet  2  . esomeprazole (NEXIUM) 40 MG capsule Take 1 capsule (40 mg total) by mouth 2 (two) times daily before a meal.  60 capsule  3  . sucralfate (CARAFATE) 1 G tablet Take 1 tablet (1 g total) by mouth 3 (three) times daily.  90 tablet  0    BP 120/80  Pulse 80  Temp 97.6 F (36.4 C) (Oral)  SpO2 99%  LMP 04/16/2012         Objective:   Physical Exam  Constitutional: She is oriented to person, place, and time. She appears well-developed and well-nourished.  HENT:  Head: Normocephalic and atraumatic.  Right Ear: External ear normal.  Left Ear: External ear normal.  Mouth/Throat: Oropharynx is clear and moist.  Neck: Neck supple.  Cardiovascular: Normal rate,  regular rhythm and normal heart sounds.   No murmur heard. Pulmonary/Chest: Effort normal and breath sounds normal. She has no wheezes.  Abdominal: Soft. Bowel sounds are normal.       Epigastric tenderness  Musculoskeletal: She exhibits no edema.       No calf tenderness  Lymphadenopathy:    She has no cervical adenopathy.  Neurological: She is alert and oriented to person, place, and time.  Psychiatric: She has a normal mood and affect. Her behavior is normal.          Assessment & Plan:

## 2012-04-22 LAB — URINE CULTURE: Colony Count: 50000

## 2012-04-23 NOTE — ED Notes (Signed)
+   Urine Chart sent to EDP office for review. 

## 2012-04-25 ENCOUNTER — Telehealth: Payer: Self-pay | Admitting: Internal Medicine

## 2012-04-25 ENCOUNTER — Encounter: Payer: Self-pay | Admitting: Gastroenterology

## 2012-04-25 NOTE — Telephone Encounter (Signed)
Patient Information:  Caller Name: Madalee  Phone: [phone]  Patient: Katherine Gregory, Katherine Gregory  Gender: Female  DOB: Jun 10, 1965  Age: 46 Years  PCP: Artist Pais, Doe-Hyun Molly Maduro) (Adults only)  Pregnant: No  Office Follow Up:  Does the office need to follow up with this patient?: Yes  Instructions For The Office: No appts available for See in Office Now disposition; info sent High Priority for staff management of appt need krs/can  RN Note:  Seen in office 04/21/12 for follow up ED visit.  States the nausea and dizziness "feels like I've taken a vitamin without something to eat first."  Onset of dizziness 04/24/12 PM.  States stools are improving while taking her nexium and sucralfate to a brown color, from dark.  Per protocol, disposition "Go to Office Now;" no appts available within an hour's time frame.  Info to office/high priority for staff management of appt need.  May reach patient at [phone]   krs/can  Symptoms  Reason For Call & Symptoms: dizziness, nausea; seen 04/21/12 chest pain, abdominal pain; being treated for bleeding ulcer.  Reviewed Health History In EMR: Yes  Reviewed Medications In EMR: Yes  Reviewed Allergies In EMR: Yes  Reviewed Surgeries / Procedures: Yes  Date of Onset of Symptoms: Unknown OB:  LMP: Unknown  Guideline(s) Used:  Dizziness  Disposition Per Guideline:   Go to Office Now  Reason For Disposition Reached:   Spinning or tilting sensation (vertigo) present now  Advice Given:  N/A

## 2012-04-28 ENCOUNTER — Ambulatory Visit (INDEPENDENT_AMBULATORY_CARE_PROVIDER_SITE_OTHER): Payer: BC Managed Care – PPO | Admitting: Internal Medicine

## 2012-04-28 ENCOUNTER — Encounter: Payer: Self-pay | Admitting: Internal Medicine

## 2012-04-28 VITALS — BP 118/84 | HR 88 | Temp 97.6°F | Wt 197.0 lb

## 2012-04-28 DIAGNOSIS — R0602 Shortness of breath: Secondary | ICD-10-CM

## 2012-04-28 DIAGNOSIS — R109 Unspecified abdominal pain: Secondary | ICD-10-CM

## 2012-04-28 DIAGNOSIS — R0789 Other chest pain: Secondary | ICD-10-CM

## 2012-04-28 LAB — CBC WITH DIFFERENTIAL/PLATELET
Basophils Absolute: 0 10*3/uL (ref 0.0–0.1)
Basophils Relative: 0 % (ref 0–1)
Eosinophils Absolute: 0 10*3/uL (ref 0.0–0.7)
Eosinophils Relative: 1 % (ref 0–5)
HCT: 38.7 % (ref 36.0–46.0)
Hemoglobin: 13.5 g/dL (ref 12.0–15.0)
Lymphocytes Relative: 43 % (ref 12–46)
Lymphs Abs: 2.5 10*3/uL (ref 0.7–4.0)
MCH: 29.6 pg (ref 26.0–34.0)
MCHC: 34.9 g/dL (ref 30.0–36.0)
MCV: 84.9 fL (ref 78.0–100.0)
Monocytes Absolute: 0.6 10*3/uL (ref 0.1–1.0)
Monocytes Relative: 9 % (ref 3–12)
Neutro Abs: 2.8 10*3/uL (ref 1.7–7.7)
Neutrophils Relative %: 47 % (ref 43–77)
Platelets: 332 10*3/uL (ref 150–400)
RBC: 4.56 MIL/uL (ref 3.87–5.11)
RDW: 13.3 % (ref 11.5–15.5)
WBC: 5.9 10*3/uL (ref 4.0–10.5)

## 2012-04-28 LAB — LIPASE: Lipase: 23 U/L (ref 0–75)

## 2012-04-28 MED ORDER — ALPRAZOLAM 0.25 MG PO TABS: 0.2500 mg | ORAL_TABLET | Freq: Two times a day (BID) | ORAL | Status: DC | PRN

## 2012-04-28 MED ORDER — ONDANSETRON HCL 4 MG PO TABS: 4.0000 mg | ORAL_TABLET | Freq: Three times a day (TID) | ORAL | Status: DC | PRN

## 2012-04-28 NOTE — Progress Notes (Signed)
Subjective:    Patient ID: Katherine Gregory, female    DOB: 07/06/1965, 46 y.o.   MRN: [id number]  HPI  46 year old African American female for followup regarding atypical chest pain.  At previous visit it was suspected that patient may have gastrointestinal etiology for her discomfort. She was tried on Carafate 3 times a day and Nexium 40 mg twice daily.  Patient reports constant nature of her symptoms have improved. She is still having intermittent chest discomfort. Her symptoms seems to be worse when she lays on her back and also her right side. Her husband also reports symptoms seem to be triggered by lifting something heavy.  She has had very poor po intake.  She has intermittent nausea  Review of Systems Intermittent shortness of breath, negative for cough  Past Medical History  Diagnosis Date  . Acid reflux   . Seasonal allergies   . Cellulitis   . GOITER   . URTICARIA   . FATIGUE   . Palpitations   . Dysuria   . FLANK PAIN, RIGHT   . HEADACHES, HX OF     History   Social History  . Marital Status: Single    Spouse Name: N/A    Number of Children: N/A  . Years of Education: N/A   Occupational History  . Not on file.   Social History Main Topics  . Smoking status: Never Smoker   . Smokeless tobacco: Not on file  . Alcohol Use: 0.6 oz/week    1 Glasses of wine per week     Comment: occassional  . Drug Use: No  . Sexually Active: Yes    Birth Control/ Protection: Surgical   Other Topics Concern  . Not on file   Social History Narrative  . No narrative on file    Past Surgical History  Procedure Date  . Cesarean section 1988 & 2008  . Wisdom tooth extraction   . Cartilage surgery     RT wrist    Family History  Problem Relation Age of Onset  . Hypertension Mother   . Huntington's disease Mother   . Diabetes Father   . Hypertension Father   . Scoliosis Sister   . Diabetes Brother   . Hypertension Brother   . Scoliosis Brother      Allergies  Allergen Reactions  . Ibuprofen     "It intensifies my pain."  . Morphine And Related Itching and Other (See Comments)    Reaction burns  . Penicillins Hives and Itching  . Pseudoephedrine Other (See Comments)    Speed up heart rate    Current Outpatient Prescriptions on File Prior to Visit  Medication Sig Dispense Refill  . cetirizine (ZYRTEC) 10 MG tablet Take 10 mg by mouth daily as needed. For allergies      . DULoxetine (CYMBALTA) 60 MG capsule Take 1 capsule (60 mg total) by mouth daily.  90 capsule  1  . esomeprazole (NEXIUM) 40 MG capsule Take 1 capsule (40 mg total) by mouth 2 (two) times daily before a meal.  60 capsule  3  . sucralfate (CARAFATE) 1 G tablet Take 1 tablet (1 g total) by mouth 3 (three) times daily.  90 tablet  0  . zolpidem (AMBIEN) 5 MG tablet Take 1 tablet (5 mg total) by mouth at bedtime as needed for sleep.  30 tablet  2    BP 118/84  Pulse 88  Temp 97.6 F (36.4 C) (Oral)  Wt 197 lb (  89.359 kg)  LMP 04/16/2012       Objective:   Physical Exam  Constitutional: She is oriented to person, place, and time. She appears well-developed and well-nourished.  HENT:  Head: Normocephalic and atraumatic.  Cardiovascular: Normal rate, regular rhythm and normal heart sounds.   Pulmonary/Chest: Effort normal and breath sounds normal. She has no wheezes.  Abdominal: Soft.       Epigastric tenderness,  Lower abdominal tenderness  Musculoskeletal: She exhibits no edema.       No calf tenderness  Neurological: She is alert and oriented to person, place, and time. No cranial nerve deficit.  Skin: Skin is warm and dry.  Psychiatric: She has a normal mood and affect. Her behavior is normal.          Assessment & Plan:

## 2012-04-28 NOTE — Assessment & Plan Note (Signed)
Constant nature of patient's symptoms have improved since starting Carafate and Nexium twice daily. She still has intermittent symptoms especially when she lays down on her back and right side. Husband reports she has not eaten much and has intermittent nausea. Check LFTs, lipase and abdominal ultrasound.  It is unclear why she is experiencing intermittent shortness of breath. She may have component of anxiety. Her previous hospital records reviewed. She had similar symptoms in April 2013.  She had negative CT of chest without contrast and negative VQ scan. She's had negative stress echo and multiple negative cardiac enzymes.  Proceed with GI consult.  Trial of alprazolam .25 mg bid prn.  Use zofran 4 mg tid prn nausea.

## 2012-04-29 ENCOUNTER — Telehealth (HOSPITAL_COMMUNITY): Payer: Self-pay | Admitting: Emergency Medicine

## 2012-04-29 LAB — BASIC METABOLIC PANEL
BUN: 9 mg/dL (ref 6–23)
CO2: 26 mEq/L (ref 19–32)
Calcium: 9.4 mg/dL (ref 8.4–10.5)
Chloride: 103 mEq/L (ref 96–112)
Creat: 0.87 mg/dL (ref 0.50–1.10)
Glucose, Bld: 84 mg/dL (ref 70–99)
Potassium: 4.8 mEq/L (ref 3.5–5.3)
Sodium: 137 mEq/L (ref 135–145)

## 2012-04-29 LAB — HEPATIC FUNCTION PANEL
ALT: <8 U/L (ref 0–35)
AST: 10 U/L (ref 0–37)
Albumin: 4 g/dL (ref 3.5–5.2)
Alkaline Phosphatase: 87 U/L (ref 39–117)
Bilirubin, Direct: 0.1 mg/dL (ref 0.0–0.3)
Indirect Bilirubin: 0.3 mg/dL (ref 0.0–0.9)
Total Bilirubin: 0.4 mg/dL (ref 0.3–1.2)
Total Protein: 7.1 g/dL (ref 6.0–8.3)

## 2012-04-29 LAB — BRAIN NATRIURETIC PEPTIDE: Brain Natriuretic Peptide: <2 pg/mL (ref 0.0–100.0)

## 2012-04-30 ENCOUNTER — Telehealth (HOSPITAL_COMMUNITY): Payer: Self-pay | Admitting: Emergency Medicine

## 2012-05-02 ENCOUNTER — Encounter: Payer: Self-pay | Admitting: Gastroenterology

## 2012-05-02 ENCOUNTER — Ambulatory Visit (INDEPENDENT_AMBULATORY_CARE_PROVIDER_SITE_OTHER): Payer: BC Managed Care – PPO | Admitting: Gastroenterology

## 2012-05-02 VITALS — BP 102/74 | HR 84 | Ht 67.5 in | Wt 195.0 lb

## 2012-05-02 DIAGNOSIS — K219 Gastro-esophageal reflux disease without esophagitis: Secondary | ICD-10-CM

## 2012-05-02 DIAGNOSIS — R109 Unspecified abdominal pain: Secondary | ICD-10-CM

## 2012-05-02 DIAGNOSIS — K625 Hemorrhage of anus and rectum: Secondary | ICD-10-CM

## 2012-05-02 NOTE — ED Notes (Signed)
Voice mail message left for patient to return call. 

## 2012-05-02 NOTE — Patient Instructions (Addendum)
You will be set up for a CT scan of abdomen and pelvis with IV and oral contrast.  You have been scheduled for a CT scan of the abdomen and pelvis at [facility] ([address]---this is in the same building as [facility]).   You are scheduled on 05/05/12 at 2 pm. You should arrive 15 minutes prior to your appointment time for registration. Please follow the written instructions below on the day of your exam:  WARNING: IF YOU ARE ALLERGIC TO IODINE/X-RAY DYE, PLEASE NOTIFY RADIOLOGY IMMEDIATELY AT [phone]! YOU WILL BE GIVEN A 13 HOUR PREMEDICATION PREP.  1) Do not eat or drink anything after 10 am (4 hours prior to your test) 2) You have been given 2 bottles of oral contrast to drink. The solution may taste better if refrigerated, but do NOT add ice or any other liquid to this solution. Shake well before drinking.    Drink 1 bottle of contrast @ 12 noon (2 hours prior to your exam)  Drink 1 bottle of contrast @ 1 pm  (1 hour prior to your exam)  You may take any medications as prescribed with a small amount of water except for the following: Metformin, Glucophage, Glucovance, Avandamet, Riomet, Fortamet, Actoplus Met, Janumet, Glumetza or Metaglip. The above medications must be held the day of the exam AND 48 hours after the exam.  The purpose of you drinking the oral contrast is to aid in the visualization of your intestinal tract. The contrast solution may cause some diarrhea. Before your exam is started, you will be given a small amount of fluid to drink. Depending on your individual set of symptoms, you may also receive an intravenous injection of x-ray contrast/dye. Plan on being at [facility] for 30 minutes or long, depending on the type of exam you are having performed.  If you have any questions regarding your exam or if you need to reschedule, you may call the CT department at [phone] between the hours of 8:00 am and 5:00 pm,  Monday-Friday.  ________________________________________________________________________  Bonita Quin should change the way you are taking your antiacid medicine (nexium) so that you are taking it 20-30 minutes prior to a decent meal as that is the way the pill is designed to work most effectively. Continue carafate three times a day. You will need colonoscopy (for rectal bleeding) and also possibly EGD. But will wait to schedule until CT results back.

## 2012-05-02 NOTE — Progress Notes (Signed)
HPI: This is a    pleasant 46 year old woman whom I am meeting for the first time today.   Labs 04/2012: cbc, cmet, amylase, lipase all normal CT scan 08/2010 abd/pelvis with IV contrast essentially normal  Negative stress test, negative PE workup this past year  She was told she has a "bleeding ulcer."    She has noticed red blood per rectum, intermittently 2-3 months ago.  Was having to strain a lot around that time.  Still intermittent constipation. Does not take anything for.  Never tried fiber supplements.    She was taking protonix.  Will have acid regurg, for years.  + intermittent dysphagia to solids  Changed to nexium about a week ago.  ONe pill twice a day.  Also on carafate for past week or so. Takes nexium around 10am, not eating much afterwords.  The evening dose 8pm, before dinner meal.  Has been having epigstric pains, previously constant but since nexium and carafate the pains have improved but not gone yet.  Overall her weight is down 5 pounds.  No nsaids.   Review of systems: Pertinent positive and negative review of systems were noted in the above HPI section. Complete review of systems was performed and was otherwise normal.    Past Medical History  Diagnosis Date  . Acid reflux   . Seasonal allergies   . Cellulitis   . GOITER   . URTICARIA   . FATIGUE   . Palpitations   . Dysuria   . FLANK PAIN, RIGHT   . HEADACHES, HX OF   . Fibromyalgia   . Bleeding gastric ulcer     Past Surgical History  Procedure Date  . Cesarean section 1988 & 2008    x 2  . Wisdom tooth extraction   . Cartilage surgery     RT wrist    Current Outpatient Prescriptions  Medication Sig Dispense Refill  . ALPRAZolam (XANAX) 0.25 MG tablet Take 1 tablet (0.25 mg total) by mouth 2 (two) times daily as needed for sleep or anxiety.  30 tablet  0  . cetirizine (ZYRTEC) 10 MG tablet Take 10 mg by mouth daily as needed. For allergies      . DULoxetine (CYMBALTA) 60 MG  capsule Take 1 capsule (60 mg total) by mouth daily.  90 capsule  1  . esomeprazole (NEXIUM) 40 MG capsule Take 1 capsule (40 mg total) by mouth 2 (two) times daily before a meal.  60 capsule  3  . ondansetron (ZOFRAN) 4 MG tablet Take 1 tablet (4 mg total) by mouth every 8 (eight) hours as needed for nausea.  30 tablet  0  . sucralfate (CARAFATE) 1 G tablet Take 1 tablet (1 g total) by mouth 3 (three) times daily.  90 tablet  0  . zolpidem (AMBIEN) 5 MG tablet Take 1 tablet (5 mg total) by mouth at bedtime as needed for sleep.  30 tablet  2    Allergies as of 05/02/2012 - Review Complete 05/02/2012  Allergen Reaction Noted  . Ibuprofen  04/03/2011  . Morphine and related Itching and Other (See Comments) 04/03/2011  . Penicillins Hives and Itching 08/03/2007  . Pseudoephedrine Other (See Comments)     Family History  Problem Relation Age of Onset  . Hypertension Mother   . Huntington's disease Mother   . Diabetes Father   . Hypertension Father   . Scoliosis Sister   . Diabetes Brother   . Hypertension Brother   .  Scoliosis Brother   . Skin cancer Maternal Grandmother   . Heart disease Father     History   Social History  . Marital Status: Single    Spouse Name: N/A    Number of Children: 2  . Years of Education: N/A   Occupational History  . print tech    Social History Main Topics  . Smoking status: Never Smoker   . Smokeless tobacco: Never Used  . Alcohol Use: 0.6 oz/week    1 Glasses of wine per week     Comment: occassional  . Drug Use: No  . Sexually Active: Yes    Birth Control/ Protection: Surgical   Other Topics Concern  . Not on file   Social History Narrative  . No narrative on file       Physical Exam: Wt 195 lb (88.451 kg)  LMP 04/16/2012 Constitutional: generally well-appearing Psychiatric: alert and oriented x3 Eyes: extraocular movements intact Mouth: oral pharynx moist, no lesions Neck: supple no lymphadenopathy Cardiovascular: heart  regular rate and rhythm Lungs: clear to auscultation bilaterally Abdomen: soft, mild to moderately tender throughout, nondistended, no obvious ascites, no peritoneal signs, normal bowel sounds Extremities: no lower extremity edema bilaterally Skin: no lesions on visible extremities    Assessment and plan: 46 y.o. female with  abdominal pain, GERD, intermittent dysphasia, minor rectal bleeding.  The most significant is her current epigastric abdominal pain. She does seem somewhat tender on examination but likely first proceed with CT scan abdomen and pelvis. She is not taking her proton pump inhibitor to correct time in relation to meals and so I had her change that. Labs last week were all normal. She will probably need a colonoscopy for her rectal bleeding and also upper endoscopy however I want to wait to see what the CT scan shows for scheduling those tests.

## 2012-05-04 ENCOUNTER — Ambulatory Visit
Admission: RE | Admit: 2012-05-04 | Discharge: 2012-05-04 | Disposition: A | Discharge status: Home / Self Care | Payer: BC Managed Care – PPO | Source: Ambulatory Visit | Attending: Internal Medicine | Admitting: Internal Medicine

## 2012-05-04 DIAGNOSIS — R109 Unspecified abdominal pain: Secondary | ICD-10-CM

## 2012-05-04 NOTE — ED Notes (Signed)
+   Urine Unable to contact via phone . Letter sent to [facility] address.

## 2012-05-05 ENCOUNTER — Ambulatory Visit (INDEPENDENT_AMBULATORY_CARE_PROVIDER_SITE_OTHER)
Admission: RE | Admit: 2012-05-05 | Discharge: 2012-05-05 | Disposition: A | Discharge status: Home / Self Care | Payer: BC Managed Care – PPO | Source: Ambulatory Visit | Attending: Gastroenterology | Admitting: Gastroenterology

## 2012-05-05 ENCOUNTER — Telehealth: Payer: Self-pay | Admitting: Internal Medicine

## 2012-05-05 DIAGNOSIS — R109 Unspecified abdominal pain: Secondary | ICD-10-CM

## 2012-05-05 MED ORDER — IOHEXOL 300 MG/ML  SOLN
100.0000 mL | Freq: Once | INTRAMUSCULAR | Status: AC | PRN
  Administered 2012-05-05: 100 mL via INTRAVENOUS

## 2012-05-05 NOTE — Telephone Encounter (Signed)
Pt  Says she lost her script for ondansetron (ZOFRAN) 4 MG tablet. Pt says she is very nauseous and would like to know if we could call in another script. Pharm [facility]/ [address]

## 2012-05-08 ENCOUNTER — Other Ambulatory Visit: Payer: Self-pay | Admitting: *Deleted

## 2012-05-08 MED ORDER — ONDANSETRON HCL 4 MG PO TABS: 4.0000 mg | ORAL_TABLET | Freq: Three times a day (TID) | ORAL | Status: DC | PRN

## 2012-05-08 NOTE — Telephone Encounter (Signed)
done

## 2012-05-08 NOTE — Telephone Encounter (Signed)
Ok to refill x 2  

## 2012-05-16 ENCOUNTER — Encounter: Payer: Self-pay | Admitting: Cardiovascular Disease

## 2012-05-16 ENCOUNTER — Encounter: Payer: Self-pay | Admitting: Internal Medicine

## 2012-05-16 ENCOUNTER — Encounter: Payer: Self-pay | Admitting: *Deleted

## 2012-05-16 ENCOUNTER — Telehealth: Payer: Self-pay | Admitting: Internal Medicine

## 2012-05-16 ENCOUNTER — Other Ambulatory Visit: Payer: Self-pay | Admitting: Cardiovascular Disease

## 2012-05-16 ENCOUNTER — Ambulatory Visit (INDEPENDENT_AMBULATORY_CARE_PROVIDER_SITE_OTHER): Payer: BC Managed Care – PPO | Admitting: Internal Medicine

## 2012-05-16 ENCOUNTER — Other Ambulatory Visit: Payer: Self-pay | Admitting: *Deleted

## 2012-05-16 ENCOUNTER — Ambulatory Visit (INDEPENDENT_AMBULATORY_CARE_PROVIDER_SITE_OTHER): Payer: BC Managed Care – PPO | Admitting: Cardiovascular Disease

## 2012-05-16 VITALS — BP 122/84 | Temp 98.2°F | Wt 203.0 lb

## 2012-05-16 VITALS — BP 130/84 | HR 92 | Ht 67.5 in | Wt 201.0 lb

## 2012-05-16 DIAGNOSIS — R109 Unspecified abdominal pain: Secondary | ICD-10-CM

## 2012-05-16 DIAGNOSIS — Z01810 Encounter for preprocedural cardiovascular examination: Secondary | ICD-10-CM

## 2012-05-16 DIAGNOSIS — R42 Dizziness and giddiness: Secondary | ICD-10-CM | POA: Insufficient documentation

## 2012-05-16 DIAGNOSIS — R002 Palpitations: Secondary | ICD-10-CM

## 2012-05-16 DIAGNOSIS — R079 Chest pain, unspecified: Secondary | ICD-10-CM

## 2012-05-16 LAB — CBC WITH DIFFERENTIAL/PLATELET
Basophils Absolute: 0 10*3/uL (ref 0.0–0.1)
Basophils Relative: 0.7 % (ref 0.0–3.0)
Eosinophils Absolute: 0 10*3/uL (ref 0.0–0.7)
Eosinophils Relative: 0.6 % (ref 0.0–5.0)
HCT: 36.9 % (ref 36.0–46.0)
Hemoglobin: 12.4 g/dL (ref 12.0–15.0)
Lymphocytes Relative: 34.6 % (ref 12.0–46.0)
Lymphs Abs: 2 10*3/uL (ref 0.7–4.0)
MCHC: 33.8 g/dL (ref 30.0–36.0)
MCV: 89.7 fl (ref 78.0–100.0)
Monocytes Absolute: 0.7 10*3/uL (ref 0.1–1.0)
Monocytes Relative: 11.8 % (ref 3.0–12.0)
Neutro Abs: 3.1 10*3/uL (ref 1.4–7.7)
Neutrophils Relative %: 52.3 % (ref 43.0–77.0)
Platelets: 305 10*3/uL (ref 150.0–400.0)
RBC: 4.11 Mil/uL (ref 3.87–5.11)
RDW: 12.3 % (ref 11.5–14.6)
WBC: 5.9 10*3/uL (ref 4.5–10.5)

## 2012-05-16 LAB — BASIC METABOLIC PANEL
BUN: 7 mg/dL (ref 6–23)
CO2: 25 mEq/L (ref 19–32)
Calcium: 9.1 mg/dL (ref 8.4–10.5)
Chloride: 106 mEq/L (ref 96–112)
Creatinine, Ser: 1 mg/dL (ref 0.4–1.2)
GFR: 78.82 mL/min (ref >60.00)
Glucose, Bld: 85 mg/dL (ref 70–99)
Potassium: 4.1 mEq/L (ref 3.5–5.1)
Sodium: 137 mEq/L (ref 135–145)

## 2012-05-16 LAB — PROTIME-INR
INR: 1.1 ratio — ABNORMAL HIGH (ref 0.8–1.0)
Prothrombin Time: 11.2 s (ref 10.2–12.4)

## 2012-05-16 MED ORDER — DIAZEPAM 2 MG PO TABS: 2.0000 mg | ORAL_TABLET | Freq: Three times a day (TID) | ORAL | Status: DC | PRN

## 2012-05-16 MED ORDER — LUBIPROSTONE 8 MCG PO CAPS: 8.0000 ug | ORAL_CAPSULE | Freq: Two times a day (BID) | ORAL | Status: DC

## 2012-05-16 NOTE — Progress Notes (Signed)
Patient ID: Katherine Gregory, female   DOB: 1965/12/01, 47 y.o.   MRN: [id number] 47 year old nonsmoker, nondiabetic female with history of acid reflux. Referred for intermittent central chest pain with radiation to back and left side of chest Seen in ER 7/3 and once prior to that On 7/1 at [facility]. Has no primary. . Patient states her symptoms have persisted and are associated with dizziness, generalized weakness and nausea, she does have acid reflux. Taking Protonix without relief.. Reports constant chest pain and feeling dizzy and weak. No vomiting. No syncope. No falls. Reports home stressors. Denies illicit drugs use. No family history. ECG in ER with mild anterolateral T wave changes. BP also high.  Since last visit BP better.   Stress echo 11/7 with chest pain normal BP response normal resting and stress echo images with baseline abnormal ECG making ECG portion of test nondiagnostic  Continues to have chest pain Occasionally exertional also a lot of lower abdominal pain and having colonoscopy 1/28.  Given patient recurrent chest pain including during ETT and new ECG changes that are fairly dramatic from October favor heart cath Discussed with patient and she is willing to proceed.    ROS: Denies fever, malais, weight loss, blurry vision, decreased visual acuity, cough, sputum, SOB, hemoptysis, pleuritic pain, palpitaitons, heartburn, abdominal pain, melena, lower extremity edema, claudication, or rash.  All other systems reviewed and negative  General: Affect appropriate Healthy:  appears stated age HEENT: normal Neck supple with no adenopathy JVP normal no bruits no thyromegaly Lungs clear with no wheezing and good diaphragmatic motion Heart:  S1/S2 no murmur, no rub, gallop or click PMI normal Abdomen: benighn, BS positve, no tenderness, no AAA no bruit.  No HSM or HJR Distal pulses intact with no bruits No edema Neuro non-focal Skin warm and dry No muscular  weakness   Current Outpatient Prescriptions  Medication Sig Dispense Refill  . ALPRAZolam (XANAX) 0.25 MG tablet Take 1 tablet (0.25 mg total) by mouth 2 (two) times daily as needed for sleep or anxiety.  30 tablet  0  . cetirizine (ZYRTEC) 10 MG tablet Take 10 mg by mouth daily as needed. For allergies      . DULoxetine (CYMBALTA) 60 MG capsule Take 1 capsule (60 mg total) by mouth daily.  90 capsule  1  . esomeprazole (NEXIUM) 40 MG capsule Take 1 capsule (40 mg total) by mouth 2 (two) times daily before a meal.  60 capsule  3  . metroNIDAZOLE (FLAGYL) 500 MG tablet Take 500 mg by mouth 2 (two) times daily.      . ondansetron (ZOFRAN) 4 MG tablet Take 1 tablet (4 mg total) by mouth every 8 (eight) hours as needed for nausea.  30 tablet  0  . ondansetron (ZOFRAN) 4 MG tablet Take 1 tablet (4 mg total) by mouth every 8 (eight) hours as needed for nausea.  20 tablet  2  . sucralfate (CARAFATE) 1 G tablet Take 1 tablet (1 g total) by mouth 3 (three) times daily.  90 tablet  0  . zolpidem (AMBIEN) 5 MG tablet Take 1 tablet (5 mg total) by mouth at bedtime as needed for sleep.  30 tablet  2    Allergies  Ibuprofen; Morphine and related; Penicillins; and Pseudoephedrine  Electrocardiogram:  12/13  SR inferolateral T wave inversions  Significant change since ECG done in October and November  Assessment and Plan

## 2012-05-16 NOTE — Progress Notes (Signed)
Subjective:    Patient ID: Katherine Gregory, female    DOB: [birth year], 47 y.o.   MRN: [id number]  HPI  47 year old [place] American female with multiple complaints for followup. Patient recently referred to gastroenterology for unexplained abdominal discomfort and pain. She underwent CT of abdomen and pelvis which was negative for any acute nominal/pelvic findings.  Patient reports being seen in the emergency room in [place] on 05/12/2012. She was experiencing severe right-sided abdominal pains. CT of abdomen and pelvis was again normal. She reports constipation. She hasn't had a BM in 3 days. This may be side effect of Zofran. Dr. Christella Hartigan is planning to perform colonoscopy and EGD.  Patient also reports experiencing intermittent dizziness and associated nausea. Symptoms can be triggered by changes in her head position.  Chest pains and EKG changes - she is scheduled for cardiac cath this Thursday.  Review of Systems Negative for fever or chills  Past Medical History  Diagnosis Date  . Acid reflux   . Seasonal allergies   . Cellulitis   . GOITER   . URTICARIA   . FATIGUE   . Palpitations   . Dysuria   . FLANK PAIN, RIGHT   . HEADACHES, HX OF   . Fibromyalgia   . Bleeding gastric ulcer     History   Social History  . Marital Status: Single    Spouse Name: N/A    Number of Children: 2  . Years of Education: N/A   Occupational History  . print tech    Social History Main Topics  . Smoking status: Never Smoker   . Smokeless tobacco: Never Used  . Alcohol Use: 0.6 oz/week    1 Glasses of wine per week     Comment: occassional  . Drug Use: No  . Sexually Active: Yes    Birth Control/ Protection: Surgical   Other Topics Concern  . Not on file   Social History Narrative  . No narrative on file    Past Surgical History  Procedure Date  . Cesarean section 1988 & 2008    x 2  . Wisdom tooth extraction   . Cartilage surgery     RT wrist    Family History    Problem Relation Age of Onset  . Hypertension Mother   . Huntington's disease Mother   . Diabetes Father   . Hypertension Father   . Scoliosis Sister   . Diabetes Brother   . Hypertension Brother   . Scoliosis Brother   . Skin cancer Maternal Grandmother   . Heart disease Father     Allergies  Allergen Reactions  . Ibuprofen     "It intensifies my pain."  . Morphine And Related Itching and Other (See Comments)    Reaction burns  . Penicillins Hives and Itching  . Pseudoephedrine Other (See Comments)    Speed up heart rate    Current Outpatient Prescriptions on File Prior to Visit  Medication Sig Dispense Refill  . ALPRAZolam (XANAX) 0.25 MG tablet Take 1 tablet (0.25 mg total) by mouth 2 (two) times daily as needed for sleep or anxiety.  30 tablet  0  . cetirizine (ZYRTEC) 10 MG tablet Take 10 mg by mouth daily as needed. For allergies      . DULoxetine (CYMBALTA) 60 MG capsule Take 1 capsule (60 mg total) by mouth daily.  90 capsule  1  . esomeprazole (NEXIUM) 40 MG capsule Take 1 capsule (40 mg total) by mouth 2 (  two) times daily before a meal.  60 capsule  3  . metroNIDAZOLE (FLAGYL) 500 MG tablet Take 500 mg by mouth 2 (two) times daily.      . sucralfate (CARAFATE) 1 G tablet Take 1 tablet (1 g total) by mouth 3 (three) times daily.  90 tablet  0  . zolpidem (AMBIEN) 5 MG tablet Take 1 tablet (5 mg total) by mouth at bedtime as needed for sleep.  30 tablet  2    BP 122/84  Temp 98.2 F (36.8 C) (Oral)  Wt 203 lb (92.08 kg)  LMP 04/16/2012       Objective:   Physical Exam  Constitutional: She is oriented to person, place, and time. She appears well-developed and well-nourished.  HENT:  Head: Normocephalic and atraumatic.  Right Ear: External ear normal.  Left Ear: External ear normal.  Mouth/Throat: Oropharynx is clear and moist.  Eyes: EOM are normal. Pupils are equal, round, and reactive to light.  Neck: Neck supple.  Cardiovascular: Normal rate, regular  rhythm and normal heart sounds.   Pulmonary/Chest: Effort normal and breath sounds normal. She has no wheezes.  Abdominal: Soft. Bowel sounds are normal. She exhibits no mass.  Musculoskeletal: She exhibits no edema.  Lymphadenopathy:    She has no cervical adenopathy.  Neurological: She is alert and oriented to person, place, and time. No cranial nerve deficit.       Dizziness improved with the Dix-Hallpike maneuver  Skin: Skin is warm and dry.  Psychiatric: She has a normal mood and affect. Her behavior is normal.          Assessment & Plan:

## 2012-05-16 NOTE — Assessment & Plan Note (Signed)
Cardiac cath scheduled this Thursday.

## 2012-05-16 NOTE — Patient Instructions (Signed)
Your physician has requested that you have a cardiac catheterization. Cardiac catheterization is used to diagnose and/or treat various heart conditions. Doctors may recommend this procedure for a number of different reasons. The most common reason is to evaluate chest pain. Chest pain can be a symptom of coronary artery disease (CAD), and cardiac catheterization can show whether plaque is narrowing or blocking your heart's arteries. This procedure is also used to evaluate the valves, as well as measure the blood flow and oxygen levels in different parts of your heart. For further information please visit [email or web address] Please follow instruction sheet, as given. DX CHEST PAIN  Your physician recommends that you return for lab work in:  TODAY  BMET CBC INR  DX  V72.81  CHEST PAIN

## 2012-05-16 NOTE — Assessment & Plan Note (Signed)
Recurrent now with ECG changes Cath to be scheduled this week.  Need to try to do before colonoscopy and further GI w/u  She has had her tubes tied and started her cycle this week and denies possibility of pregnancy

## 2012-05-16 NOTE — Telephone Encounter (Signed)
[facility] Triage Call Report Triage Record Num: [id number] Operator: Albertine Grates Patient Name: [facility] Call Date & Time: 05/15/2012 5:13:20PM Patient Phone: [phone] PCP: Thomos Lemons Patient Gender: Female PCP Fax : [phone] Patient DOB: 1965-07-12 [facility] Name: [facility] Reason for Call: Caller: Rheta/Patient; PCP: Ethelene Hal Molly Maduro) (Adults only); CB#: [phone]; States has had to be seen at ED in [place] 1-3 due to abdominal pain. Was told ovarian cyst had ruptured. Was told to follow up with GYN or PCP but no diagnosis given. Feels dizzy since 1-1 and feels like "room is spinning". Continues to have pain on lower right side of abdomen 1-6. Afebrile. Pain is intermittent. Per Abdominal Pain protocol, advised appointment 1-7 and scheduled for 4:00. Protocol(s) Used: Abdominal Pain Recommended Outcome per Protocol: See Provider within 24 hours Reason for Outcome: New onset constant mild or aching lower abdominal pain Care Advice: Call provider immediately if pain gets worse, localized to one spot or lasts more than 4 hours and prevents normal activity; tell provider if vomiting begins after onset of pain or if having any fever. ~ 01/06/

## 2012-05-16 NOTE — Assessment & Plan Note (Signed)
Resolved no evidence of arrhythmia 

## 2012-05-16 NOTE — Patient Instructions (Signed)
Use miralax over the counter once daily for constipation

## 2012-05-16 NOTE — Assessment & Plan Note (Signed)
Patient having intermittent abdominal pains. Symptoms can be worse when she lays in her record left side. I suspect her symptoms are secondary to possible IBS. Use laxatives for now for constipation. She scheduled for colonoscopy with Dr. Christella Hartigan.

## 2012-05-16 NOTE — Assessment & Plan Note (Signed)
Patient having intermittent dizziness with associated nausea. I suspect her symptoms may be secondary to positional vertigo.  Use low dose valium as needed.  We discussed exercises to help with her dizziness. If persistent symptoms, consider referral to vestibular rehabilitation.

## 2012-05-17 ENCOUNTER — Other Ambulatory Visit: Payer: Self-pay | Admitting: Cardiovascular Disease

## 2012-05-17 DIAGNOSIS — R079 Chest pain, unspecified: Secondary | ICD-10-CM

## 2012-05-18 ENCOUNTER — Encounter (HOSPITAL_BASED_OUTPATIENT_CLINIC_OR_DEPARTMENT_OTHER)
Admission: RE | Disposition: A | Discharge status: Home / Self Care | Payer: Self-pay | Source: Ambulatory Visit | Attending: Cardiovascular Disease

## 2012-05-18 ENCOUNTER — Inpatient Hospital Stay (HOSPITAL_BASED_OUTPATIENT_CLINIC_OR_DEPARTMENT_OTHER)
Admission: RE | Admit: 2012-05-18 | Discharge: 2012-05-18 | Disposition: A | Discharge status: Home / Self Care | Payer: BC Managed Care – PPO | Source: Ambulatory Visit | Attending: Cardiovascular Disease | Admitting: Cardiovascular Disease

## 2012-05-18 DIAGNOSIS — R079 Chest pain, unspecified: Secondary | ICD-10-CM

## 2012-05-18 DIAGNOSIS — R0789 Other chest pain: Secondary | ICD-10-CM | POA: Insufficient documentation

## 2012-05-18 SURGERY — JV LEFT HEART CATHETERIZATION WITH CORONARY ANGIOGRAM
Anesthesia: Moderate Sedation

## 2012-05-18 MED ORDER — ACETAMINOPHEN 325 MG PO TABS: 650.0000 mg | ORAL_TABLET | ORAL | Status: DC | PRN

## 2012-05-18 MED ORDER — SODIUM CHLORIDE 0.9 % IJ SOLN: 3.0000 mL | Freq: Two times a day (BID) | INTRAMUSCULAR | Status: DC

## 2012-05-18 MED ORDER — SODIUM CHLORIDE 0.9 % IJ SOLN: 3.0000 mL | INTRAMUSCULAR | Status: DC | PRN

## 2012-05-18 MED ORDER — FENTANYL CITRATE 100 MCG/ACT NA SOLN: 0.0250 mg | Freq: Once | NASAL | Status: DC

## 2012-05-18 MED ORDER — SODIUM CHLORIDE 0.9 % IV SOLN
250.0000 mL | INTRAVENOUS | Status: DC | PRN
  Administered 2012-05-18: 250 mL via INTRAVENOUS

## 2012-05-18 MED ORDER — SODIUM CHLORIDE 0.9 % IV SOLN: 250.0000 mL | INTRAVENOUS | Status: DC | PRN

## 2012-05-18 MED ORDER — DIAZEPAM 2 MG PO TABS: 2.0000 mg | ORAL_TABLET | ORAL | Status: DC | PRN

## 2012-05-18 MED ORDER — SODIUM CHLORIDE 0.45 % IV SOLN: INTRAVENOUS | Status: AC

## 2012-05-18 MED ORDER — ONDANSETRON HCL 4 MG/2ML IJ SOLN: 4.0000 mg | Freq: Four times a day (QID) | INTRAMUSCULAR | Status: DC | PRN

## 2012-05-18 NOTE — Progress Notes (Signed)
Allen's test performed on right hand with negative results, spo2 waveform completely flat.

## 2012-05-18 NOTE — CV Procedure (Signed)
      Catheterization   Indication:  Chest Pain  Procedure: After informed consent and clinical "time out" the right groin was prepped and draped in a sterile fashion.  A 5Fr sheath was placed in the right femoral artery using seldinger technique and local lidocaine.  Standard JL4, JR4 and angled pigtail catheters were used to engage the coronary arteries.  Coronary arteries were visualized in orthogonal views using caudal and cranial angulation.  RAO ventriculography was done using 28* cc of contrast.    Medications:   Versed: 2 mg's  Fentanyl: 0 ug's  Coronary Arteries: Right dominant with no anomalies  LM: Normal  LAD: Normal    D1: small and normal  D2  normal  Circumflex: normal   OM1: small and normal  OM2: Large branching vessel normal  RCA: Normal   PDA: Normal  PLA: Normal  Ventriculography: EF: 55 %, no RWMA;s  Hemodynamics:  Aortic Pressure: 124 69 mmHg  LV Pressure: 124 9  mmHg  Impression:  No significant CAD  F/U primary regarding noncardiac causes of pain and further abdominal w/u  Charlton Haws 05/18/2012 10:50 AM

## 2012-05-18 NOTE — Interval H&P Note (Signed)
History and Physical Interval Note:  05/18/2012 9:26 AM  Katherine Gregory  has presented today for surgery, with the diagnosis of cp  The various methods of treatment have been discussed with the patient and family. After consideration of risks, benefits and other options for treatment, the patient has consented to  Procedure(s) (LRB) with comments: JV LEFT HEART CATHETERIZATION WITH CORONARY ANGIOGRAM (N/A) as a surgical intervention .  The patient's history has been reviewed, patient examined, no change in status, stable for surgery.  I have reviewed the patient's chart and labs.  Questions were answered to the patient's satisfaction.     Charlton Haws

## 2012-05-18 NOTE — H&P (View-Only) (Signed)
Patient ID: Katherine Gregory, female   DOB: 03/18/1966, 47 y.o.   MRN: [id number] 47-year-old nonsmoker, nondiabetic female with history of acid reflux. Referred for intermittent central chest pain with radiation to back and left side of chest Seen in ER 7/3 and once prior to that On 7/1 at [facility]. Has no primary. . Patient states her symptoms have persisted and are associated with dizziness, generalized weakness and nausea, she does have acid reflux. Taking Protonix without relief.. Reports constant chest pain and feeling dizzy and weak. No vomiting. No syncope. No falls. Reports home stressors. Denies illicit drugs use. No family history. ECG in ER with mild anterolateral T wave changes. BP also high.  Since last visit BP better.   Stress echo 11/7 with chest pain normal BP response normal resting and stress echo images with baseline abnormal ECG making ECG portion of test nondiagnostic  Continues to have chest pain Occasionally exertional also a lot of lower abdominal pain and having colonoscopy 1/28.  Given patient recurrent chest pain including during ETT and new ECG changes that are fairly dramatic from October favor heart cath Discussed with patient and she is willing to proceed.    ROS: Denies fever, malais, weight loss, blurry vision, decreased visual acuity, cough, sputum, SOB, hemoptysis, pleuritic pain, palpitaitons, heartburn, abdominal pain, melena, lower extremity edema, claudication, or rash.  All other systems reviewed and negative  General: Affect appropriate Healthy:  appears stated age HEENT: normal Neck supple with no adenopathy JVP normal no bruits no thyromegaly Lungs clear with no wheezing and good diaphragmatic motion Heart:  S1/S2 no murmur, no rub, gallop or click PMI normal Abdomen: benighn, BS positve, no tenderness, no AAA no bruit.  No HSM or HJR Distal pulses intact with no bruits No edema Neuro non-focal Skin warm and dry No muscular  weakness   Current Outpatient Prescriptions  Medication Sig Dispense Refill  . ALPRAZolam (XANAX) 0.25 MG tablet Take 1 tablet (0.25 mg total) by mouth 2 (two) times daily as needed for sleep or anxiety.  30 tablet  0  . cetirizine (ZYRTEC) 10 MG tablet Take 10 mg by mouth daily as needed. For allergies      . DULoxetine (CYMBALTA) 60 MG capsule Take 1 capsule (60 mg total) by mouth daily.  90 capsule  1  . esomeprazole (NEXIUM) 40 MG capsule Take 1 capsule (40 mg total) by mouth 2 (two) times daily before a meal.  60 capsule  3  . metroNIDAZOLE (FLAGYL) 500 MG tablet Take 500 mg by mouth 2 (two) times daily.      . ondansetron (ZOFRAN) 4 MG tablet Take 1 tablet (4 mg total) by mouth every 8 (eight) hours as needed for nausea.  30 tablet  0  . ondansetron (ZOFRAN) 4 MG tablet Take 1 tablet (4 mg total) by mouth every 8 (eight) hours as needed for nausea.  20 tablet  2  . sucralfate (CARAFATE) 1 G tablet Take 1 tablet (1 g total) by mouth 3 (three) times daily.  90 tablet  0  . zolpidem (AMBIEN) 5 MG tablet Take 1 tablet (5 mg total) by mouth at bedtime as needed for sleep.  30 tablet  2    Allergies  Ibuprofen; Morphine and related; Penicillins; and Pseudoephedrine  Electrocardiogram:  12/13  SR inferolateral T wave inversions  Significant change since ECG done in October and November  Assessment and Plan   

## 2012-05-19 ENCOUNTER — Ambulatory Visit: Payer: BC Managed Care – PPO | Admitting: Internal Medicine

## 2012-05-22 ENCOUNTER — Telehealth: Payer: Self-pay | Admitting: Internal Medicine

## 2012-05-22 NOTE — Telephone Encounter (Signed)
[facility] Triage Call Report Triage Record Num: [id number] Operator: Albertine Grates Patient Name: Katherine Gregory Call Date & Time: 05/19/2012 5:22:50PM Patient Phone: [phone] PCP: Thomos Lemons Patient Gender: Female PCP Fax : [phone] Patient DOB: [birth year] [facility] Reason for Call: Caller: Baylynn/Patient; PCP: Ethelene Hal Molly Maduro) (Adults only); CB#: [phone]; Was seen in office 1-7 due to constipation. Was prescribed a medicine to help but began having stool before taking the medicine. Has had bowel movements daily and is not diarrhea but is not formed. Was diagnosed with irritable bowel in office. Has pain in lower abdominal area. Is intermittent. LMP 05-19-11. Has also had intermittent chest pain. Advised call cardiologist as had cath 1-9. Care advice given per Abdominal Pain protocol. Protocol(s) Used: Abdominal Pain Recommended Outcome per Protocol: See Provider within 72 Hours Reason for Outcome: All other situations Care Advice: Talk to provider immediately if having repeated episodes pain/discomfort in lower chest or back; indigestion not relieved with antacids; shortness of breath without chest pain; or unexplained fatigue, weakness or anxiety. ~ 01/10/

## 2012-05-29 ENCOUNTER — Telehealth: Payer: Self-pay | Admitting: *Deleted

## 2012-05-29 NOTE — Telephone Encounter (Signed)
Dr Christella Hartigan,  This pt saw you 05-02-12 and is scheduled for an ECL on 06-06-12.  She has had some recent cardiac issues and I just would like you to review this just to be sure she is still ok to be done.  Thanks, [employer]

## 2012-05-30 ENCOUNTER — Ambulatory Visit (AMBULATORY_SURGERY_CENTER): Payer: BC Managed Care – PPO

## 2012-05-30 ENCOUNTER — Encounter: Payer: Self-pay | Admitting: Gastroenterology

## 2012-05-30 VITALS — Ht 70.0 in | Wt 196.4 lb

## 2012-05-30 DIAGNOSIS — K625 Hemorrhage of anus and rectum: Secondary | ICD-10-CM

## 2012-05-30 DIAGNOSIS — K219 Gastro-esophageal reflux disease without esophagitis: Secondary | ICD-10-CM

## 2012-05-30 DIAGNOSIS — R109 Unspecified abdominal pain: Secondary | ICD-10-CM

## 2012-05-30 MED ORDER — MOVIPREP 100 G PO SOLR: ORAL | Status: DC

## 2012-05-31 ENCOUNTER — Ambulatory Visit (INDEPENDENT_AMBULATORY_CARE_PROVIDER_SITE_OTHER): Payer: BC Managed Care – PPO | Admitting: Cardiovascular Disease

## 2012-05-31 ENCOUNTER — Encounter: Payer: Self-pay | Admitting: Cardiovascular Disease

## 2012-05-31 VITALS — BP 122/88 | HR 72 | Ht 70.0 in | Wt 195.0 lb

## 2012-05-31 DIAGNOSIS — R030 Elevated blood-pressure reading, without diagnosis of hypertension: Secondary | ICD-10-CM

## 2012-05-31 DIAGNOSIS — R079 Chest pain, unspecified: Secondary | ICD-10-CM

## 2012-05-31 DIAGNOSIS — R002 Palpitations: Secondary | ICD-10-CM

## 2012-05-31 NOTE — Patient Instructions (Signed)
Your physician recommends that you schedule a follow-up appointment in: AS NEEDED  Your physician recommends that you continue on your current medications as directed. Please refer to the Current Medication list given to you today.  

## 2012-05-31 NOTE — Assessment & Plan Note (Signed)
Weight loss and low sodium diet discussed

## 2012-05-31 NOTE — Assessment & Plan Note (Signed)
Noncardiac with normal cath and stress echo

## 2012-05-31 NOTE — Progress Notes (Signed)
Patient ID: Katherine Gregory, female   DOB: 02-10-1966, 47 y.o.   MRN: [id number] 47 year old nonsmoker, nondiabetic female with history of acid reflux. Referred for intermittent central chest pain with radiation to back and left side of chest Seen in ER 7/3 and once prior to that On 7/1 at [facility] med center. Has no primary. . Patient states her symptoms have persisted and are associated with dizziness, generalized weakness and nausea, she does have acid reflux. Taking Protonix without relief.. Reports constant chest pain and feeling dizzy and weak. No vomiting. No syncope. No falls. Reports home stressors. Denies illicit drugs use. No family history. ECG in ER with mild anterolateral T wave changes. BP also high.  Since last visit BP better.  Stress echo 11/7 with chest pain normal BP response normal resting and stress echo images with baseline abnormal ECG making ECG portion of test nondiagnostic  Cath done 1/9 was normal with no CAD  Ok to have colonoscopy with Dr Christella Hartigan  ROS: Denies fever, malais, weight loss, blurry vision, decreased visual acuity, cough, sputum, SOB, hemoptysis, pleuritic pain, palpitaitons, heartburn, abdominal pain, melena, lower extremity edema, claudication, or rash.  All other systems reviewed and negative  General: Affect appropriate Healthy:  appears stated age HEENT: normal Neck supple with no adenopathy JVP normal no bruits no thyromegaly Lungs clear with no wheezing and good diaphragmatic motion Heart:  S1/S2 no murmur, no rub, gallop or click PMI normal Abdomen: benighn, BS positve, no tenderness, no AAA no bruit.  No HSM or HJR Distal pulses intact with no bruits No edema Neuro non-focal Skin warm and dry No muscular weakness Cath sight in RFA healed well with no hematoma   Current Outpatient Prescriptions  Medication Sig Dispense Refill  . ALPRAZolam (XANAX) 0.25 MG tablet Take 1 tablet (0.25 mg total) by mouth 2 (two) times daily as needed for sleep or  anxiety.  30 tablet  0  . cetirizine (ZYRTEC) 10 MG tablet Take 10 mg by mouth daily as needed. For allergies      . diazepam (VALIUM) 2 MG tablet Take 1 tablet (2 mg total) by mouth every 8 (eight) hours as needed.  30 tablet  1  . DULoxetine (CYMBALTA) 60 MG capsule Take 1 capsule (60 mg total) by mouth daily.  90 capsule  1  . esomeprazole (NEXIUM) 40 MG capsule Take 1 capsule (40 mg total) by mouth 2 (two) times daily before a meal.  60 capsule  3  . lubiprostone (AMITIZA) 8 MCG capsule Take 1 capsule (8 mcg total) by mouth 2 (two) times daily with a meal.  28 capsule  0  . MOVIPREP 100 G SOLR Moviprep as directed / no substitutions  1 kit  0  . zolpidem (AMBIEN) 5 MG tablet Take 1 tablet (5 mg total) by mouth at bedtime as needed for sleep.  30 tablet  2    Allergies  Ibuprofen; Morphine and related; Penicillins; Pseudoephedrine; and Adhesive  Electrocardiogram:  NSR normal ECG  Assessment and Plan

## 2012-05-31 NOTE — Assessment & Plan Note (Signed)
Benign resolved with no evidence of arrhythmia

## 2012-06-01 NOTE — Telephone Encounter (Signed)
Thanks  She was ok'd by her cardiologist for the colonoscopy on her vist with him on jan 22nd.

## 2012-06-01 NOTE — Telephone Encounter (Signed)
Pt notified all is well to proceed with procedure on 1-28 as scheduled per dr Christella Hartigan and her cardiologist. See TE. Pt returned verbal understanding of instructions. ewm

## 2012-06-06 ENCOUNTER — Encounter: Payer: Self-pay | Admitting: Gastroenterology

## 2012-06-06 ENCOUNTER — Ambulatory Visit (AMBULATORY_SURGERY_CENTER): Payer: BC Managed Care – PPO | Admitting: Gastroenterology

## 2012-06-06 VITALS — BP 147/92 | HR 64 | Temp 97.3°F | Resp 16 | Ht 70.0 in | Wt 196.0 lb

## 2012-06-06 DIAGNOSIS — K648 Other hemorrhoids: Secondary | ICD-10-CM

## 2012-06-06 DIAGNOSIS — K2970 Gastritis, unspecified, without bleeding: Secondary | ICD-10-CM

## 2012-06-06 DIAGNOSIS — K2990 Gastroduodenitis, unspecified, without bleeding: Secondary | ICD-10-CM

## 2012-06-06 DIAGNOSIS — K625 Hemorrhage of anus and rectum: Secondary | ICD-10-CM

## 2012-06-06 DIAGNOSIS — R109 Unspecified abdominal pain: Secondary | ICD-10-CM

## 2012-06-06 DIAGNOSIS — K219 Gastro-esophageal reflux disease without esophagitis: Secondary | ICD-10-CM

## 2012-06-06 MED ORDER — SODIUM CHLORIDE 0.9 % IV SOLN: 500.0000 mL | INTRAVENOUS | Status: DC

## 2012-06-06 NOTE — Progress Notes (Signed)
Attempt to start IV Right hand and left anticubital unsucessful by Jennye Boroughs RN.  Attempt to start IV Right wrist unsucessful by Elmon Kirschner RN.  Pt very apprehensive throughout attempts.

## 2012-06-06 NOTE — Op Note (Signed)
[facility] [address] [place], [address]   ENDOSCOPY PROCEDURE REPORT  PATIENT: Katherine, Gregory  MR#: [id number] BIRTHDATE: February 19, 1966 , 46  yrs. old GENDER: Female ENDOSCOPIST: Rachael Fee, MD PROCEDURE DATE:  06/06/2012 PROCEDURE:  EGD w/ biopsy ASA CLASS:     Class III INDICATIONS:  abdominal pain. MEDICATIONS: There was residual sedation effect present from prior procedure, Versed 1mg  IV, and These medications were titrated to patient response per physician's verbal order TOPICAL ANESTHETIC: Cetacaine Spray  DESCRIPTION OF PROCEDURE: After the risks benefits and alternatives of the procedure were thoroughly explained, informed consent was obtained.  [facility] GIF-H180 [id number] endoscope was introduced through the mouth and advanced to the second portion of the duodenum. Without limitations.  The instrument was slowly withdrawn as the mucosa was fully examined.    There was mild, non-specific gastritis.  This was biopsied and sent to pathology.  The examination was otherwise normal.  Retroflexed views revealed no abnormalities.     The scope was then withdrawn from the patient and the procedure completed. COMPLICATIONS: There were no complications.  ENDOSCOPIC IMPRESSION: There was mild, non-specific gastritis, biopsied to check for H. pylori. The examination was otherwise normal.  RECOMMENDATIONS: If biopsies show H.  pylori, will prescribe appropriate antibiotics.   eSigned:  Rachael Fee, MD 06/06/2012 11:16 AM   CC: Thomos Lemons, DO

## 2012-06-06 NOTE — Progress Notes (Signed)
Patient did not experience any of the following events: a burn prior to discharge; a fall within the facility; wrong site/side/patient/procedure/implant event; or a hospital transfer or hospital admission upon discharge from the facility. (G8907)Patient did not have preoperative order for IV antibiotic SSI prophylaxis. (G8918) ewm 

## 2012-06-06 NOTE — Progress Notes (Signed)
Pt was having cramping and was uncomfortable while the scope was advanced to the cecum.  IV conscious sedation was administered per md's orders.  Once the cecum was reached the pt relaxed and rested comfortably. Maw

## 2012-06-06 NOTE — Progress Notes (Signed)
The pt tolerated the colonoscopy and egd very well. Maw

## 2012-06-06 NOTE — Patient Instructions (Addendum)
YOU HAD AN ENDOSCOPIC PROCEDURE TODAY AT [facility]: Refer to the procedure report that was given to you for any specific questions about what was found during the examination.  If the procedure report does not answer your questions, please call your gastroenterologist to clarify.  If you requested that your care partner not be given the details of your procedure findings, then the procedure report has been included in a sealed envelope for you to review at your convenience later.  YOU SHOULD EXPECT: Some feelings of bloating in the abdomen. Passage of more gas than usual.  Walking can help get rid of the air that was put into your GI tract during the procedure and reduce the bloating. If you had a lower endoscopy (such as a colonoscopy or flexible sigmoidoscopy) you may notice spotting of blood in your stool or on the toilet paper. If you underwent a bowel prep for your procedure, then you may not have a normal bowel movement for a few days.  DIET: Your first meal following the procedure should be a light meal and then it is ok to progress to your normal diet.  A half-sandwich or bowl of soup is an example of a good first meal.  Heavy or fried foods are harder to digest and may make you feel nauseous or bloated.  Likewise meals heavy in dairy and vegetables can cause extra gas to form and this can also increase the bloating.  Drink plenty of fluids but you should avoid alcoholic beverages for 24 hours.  ACTIVITY: Your care partner should take you home directly after the procedure.  You should plan to take it easy, moving slowly for the rest of the day.  You can resume normal activity the day after the procedure however you should NOT DRIVE or use heavy machinery for 24 hours (because of the sedation medicines used during the test).    SYMPTOMS TO REPORT IMMEDIATELY: A gastroenterologist can be reached at any hour.  During normal business hours, 8:30 AM to 5:00 PM Monday through Friday,  call [phone].  After hours and on weekends, please call the GI answering service at [phone]  Emergency number who will take a message and have the physician on call contact you.   Following lower endoscopy (colonoscopy or flexible sigmoidoscopy):  Excessive amounts of blood in the stool  Significant tenderness or worsening of abdominal pains  Swelling of the abdomen that is new, acute  Fever of 100F or higher  Following upper endoscopy (EGD)  Vomiting of blood or coffee ground material  New chest pain or pain under the shoulder blades  Painful or persistently difficult swallowing  New shortness of breath  Fever of 100F or higher  Black, tarry-looking stools  FOLLOW UP: If any biopsies were taken you will be contacted by phone or by letter within the next 1-3 weeks.  Call your gastroenterologist if you have not heard about the biopsies in 3 weeks.  Our staff will call the home number listed on your records the next business day following your procedure to check on you and address any questions or concerns that you may have at that time regarding the information given to you following your procedure. This is a courtesy call and so if there is no answer at the home number and we have not heard from you through the emergency physician on call, we will assume that you have returned to your regular daily activities without incident.  SIGNATURES/CONFIDENTIALITY: You  and/or your care partner have signed paperwork which will be entered into your electronic medical record.  These signatures attest to the fact that that the information above on your After Visit Summary has been reviewed and is understood.  Full responsibility of the confidentiality of this discharge information lies with you and/or your care-partner.   Handout on hemorrhoids and gastritis  If biopsies of the stomach are positive for H pylori, dr Larae Grooms office will call you to prescribe an antibiotic.   Please start  citrucel daily over the counter for fiber per dr Larae Grooms

## 2012-06-06 NOTE — Op Note (Signed)
[facility] [address] [place], [address]   COLONOSCOPY PROCEDURE REPORT  PATIENT: Katherine Gregory, Reader  MR#: [id number] BIRTHDATE: 1966-02-07 , 46  yrs. old GENDER: Female ENDOSCOPIST: Rachael Fee, MD REFERRED OZ:DGUYQI Artist Pais, DO PROCEDURE DATE:  06/06/2012 PROCEDURE:   Colonoscopy, diagnostic ASA CLASS:   Class II INDICATIONS:rectal bleeding, abdominal pain, constipation. MEDICATIONS: Fentanyl 75 mcg IV, Versed 6 mg IV, and These medications were titrated to patient response per physician's verbal order  DESCRIPTION OF PROCEDURE:   After the risks benefits and alternatives of the procedure were thoroughly explained, informed consent was obtained.  A digital rectal exam revealed no abnormalities of the rectum.   The [facility] [id number]  endoscope was introduced through the anus and advanced to the cecum, which was identified by both the appendix and ileocecal valve. No adverse events experienced.   The quality of the prep was good, using MoviPrep  The instrument was then slowly withdrawn as the colon was fully examined.   COLON FINDINGS: There were small internal hemorrhoids.  The examination was otherwise normal.  No polyps or cancers. Retroflexed views revealed no abnormalities. The time to cecum=4 minutes 15 seconds.  Withdrawal time=4 minutes 45 seconds.  The scope was withdrawn and the procedure completed. COMPLICATIONS: There were no complications.  ENDOSCOPIC IMPRESSION: There were small internal hemorrhoids.  These are the likely source of your mild bleeding. The examination was otherwise normal. No polyps or cancers.  RECOMMENDATIONS: You should continue to follow colorectal cancer screening guidelines for "routine risk" patients with a repeat colonoscopy in 10 years. There is no need for FOBT (stool) testing for at least 5 years. You should start once daily fiber supplements for your intermittent constipation. This could potentially  help your lower abdominal pains as well.  eSigned:  Rachael Fee, MD 06/06/2012 11:13 AM

## 2012-06-07 ENCOUNTER — Telehealth: Payer: Self-pay | Admitting: *Deleted

## 2012-06-07 NOTE — Telephone Encounter (Signed)
  Follow up Call-  Call back number 06/06/2012  Post procedure Call Back phone  # [phone]  Permission to leave phone message Yes     Patient questions:  Do you have a fever, pain , or abdominal swelling? no Pain Score  0 *  Have you tolerated food without any problems? yes  Have you been able to return to your normal activities? yes  Do you have any questions about your discharge instructions: Diet   no Medications  no Follow up visit  no  Do you have questions or concerns about your Care? yes  Actions: * If pain score is 4 or above: No action needed, pain <4. PATIENT QUESTIONED ABOUT "THE BACTERIA IN MY STOMACH AND BIOPSIES."EXPLAINED TO PATIENT THAT SHE IS BEING CHECKED FOR H PYLORI AND MD WILL NOTIFY THE PATIENT IF SHE IS POSITIVE AND PLACE ON ANTIBIOTICS. PAATIENT VERBALIZED UNDERSTANDING.

## 2012-06-13 ENCOUNTER — Ambulatory Visit (INDEPENDENT_AMBULATORY_CARE_PROVIDER_SITE_OTHER): Payer: BC Managed Care – PPO | Admitting: Internal Medicine

## 2012-06-13 ENCOUNTER — Encounter: Payer: Self-pay | Admitting: Internal Medicine

## 2012-06-13 ENCOUNTER — Encounter: Payer: Self-pay | Admitting: Gastroenterology

## 2012-06-13 VITALS — BP 118/80 | Temp 97.3°F | Wt 200.0 lb

## 2012-06-13 DIAGNOSIS — R42 Dizziness and giddiness: Secondary | ICD-10-CM

## 2012-06-13 DIAGNOSIS — R0789 Other chest pain: Secondary | ICD-10-CM

## 2012-06-13 NOTE — Progress Notes (Signed)
Subjective:    Patient ID: Katherine Gregory, female    DOB: 02/24/1966, 47 y.o.   MRN: [id number]  HPI  47 year old [place] American female with multiple complaints for followup.s American female with multiple complaints for followup. Patient previously seen for unexplained chest pain, abdominal discomfort and vertigo. Patient was seen by cardiologist and underwent cardiac cath. Cardiac cath was normal. She has normal ejection fraction.  She was then seen by gastroenterology to explore noncardiac sources of chest pain. She underwent EGD and colonoscopy. EGD was notable only for nonspecific gastritis. Her stomach biopsies were negative for malignancy or H. Pylori. Patient reports she still has intermittent epigastric discomfort.  She is currently taking Nexium 40 mg twice daily.  Patient still experiencing intermittent vertigo symptoms/dizziness. She is accompanied by supportive sister-in-law.  She had episode of vertigo/weakness on Saturday last week. Her symptoms lasted 24-36 hours. Minimal improvement with use of diazepam.  Her weight is stable. Sister-in-law reports patient has poor appetite.  Review of Systems Negative for orthostatic hypotension,  Intermittent epigastric discomfort  Past Medical History  Diagnosis Date  . Acid reflux   . Seasonal allergies   . Cellulitis   . GOITER   . URTICARIA   . FATIGUE   . Palpitations   . Dysuria   . FLANK PAIN, RIGHT   . HEADACHES, HX OF   . Fibromyalgia   . Bleeding gastric ulcer     History   Social History  . Marital Status: Single    Spouse Name: N/A    Number of Children: 2  . Years of Education: N/A   Occupational History  . print tech    Social History Main Topics  . Smoking status: Never Smoker   . Smokeless tobacco: Never Used  . Alcohol Use: No     Comment: occassional  . Drug Use: No  . Sexually Active: Yes    Birth Control/ Protection: Surgical   Other Topics Concern  . Not on file   Social History Narrative  . No narrative on file    Past Surgical  History  Procedure Date  . Cesarean section 1988 & 2008    x 2  . Wisdom tooth extraction   . Cartilage surgery     RT wrist  . Cardiac catheterization     05/17/12 / waiting on results    Family History  Problem Relation Age of Onset  . Hypertension Mother   . Huntington's disease Mother   . Diabetes Father   . Hypertension Father   . Heart disease Father   . Scoliosis Sister   . Huntington's disease Sister   . Diabetes Brother   . Hypertension Brother   . Scoliosis Brother   . Skin cancer Maternal Grandmother     Allergies  Allergen Reactions  . Ibuprofen     "It intensifies my pain."  . Morphine And Related Itching and Other (See Comments)    Reaction burns  . Penicillins Hives and Itching  . Pseudoephedrine Other (See Comments)    Speed up heart rate  . Adhesive (Tape)     Itching and burning at site    Current Outpatient Prescriptions on File Prior to Visit  Medication Sig Dispense Refill  . ALPRAZolam (XANAX) 0.25 MG tablet Take 1 tablet (0.25 mg total) by mouth 2 (two) times daily as needed for sleep or anxiety.  30 tablet  0  . cetirizine (ZYRTEC) 10 MG tablet Take 10 mg by mouth daily as needed. For allergies      .  diazepam (VALIUM) 2 MG tablet Take 1 tablet (2 mg total) by mouth every 8 (eight) hours as needed.  30 tablet  1  . DULoxetine (CYMBALTA) 60 MG capsule Take 1 capsule (60 mg total) by mouth daily.  90 capsule  1  . esomeprazole (NEXIUM) 40 MG capsule Take 1 capsule (40 mg total) by mouth 2 (two) times daily before a meal.  60 capsule  3  . lubiprostone (AMITIZA) 8 MCG capsule Take 1 capsule (8 mcg total) by mouth 2 (two) times daily with a meal.  28 capsule  0  . zolpidem (AMBIEN) 5 MG tablet Take 1 tablet (5 mg total) by mouth at bedtime as needed for sleep.  30 tablet  2    BP 118/80  Temp 97.3 F (36.3 C) (Oral)  Wt 200 lb (90.719 kg)  LMP 04/06/2012  Blood pressure 120/80 sitting, 130/80 standing.  No significant change in heart  rate     Objective:   Physical Exam  Constitutional: She is oriented to person, place, and time. She appears well-developed and well-nourished.  HENT:  Head: Normocephalic and atraumatic.  Right Ear: External ear normal.  Left Ear: External ear normal.  Eyes: Conjunctivae normal and EOM are normal. Pupils are equal, round, and reactive to light.  Cardiovascular: Normal rate, regular rhythm and normal heart sounds.   No murmur heard. Pulmonary/Chest: Effort normal and breath sounds normal. She has no wheezes.  Neurological: She is oriented to person, place, and time. No cranial nerve deficit. Coordination normal.  Skin: Skin is warm and dry.  Psychiatric: She has a normal mood and affect. Her behavior is normal.          Assessment & Plan:

## 2012-06-13 NOTE — Assessment & Plan Note (Signed)
Patient experiencing persistent episodes of dizziness with nausea. Unclear whether symptoms are positional vertigo versus central cause. Refer to ENT for further evaluation. No significant improvement with low dose Valium.

## 2012-06-13 NOTE — Assessment & Plan Note (Signed)
Cardiac cath negative. Source of symptoms likely gastrointestinal. EGD showed nonspecific gastritis. She is still having persistent symptoms despite Nexium 40 mg twice a day. Patient encouraged to followup with gastroenterologist. Consider gastric emptying study.  Patient also having unexplained dizziness/vertigo. She does not have orthostatic hypotension on exam. However considering multiple unexplained symptoms, consider workup for adrenal insufficiency.

## 2012-06-18 ENCOUNTER — Telehealth (HOSPITAL_COMMUNITY): Payer: Self-pay | Admitting: Emergency Medicine

## 2012-06-18 NOTE — ED Notes (Signed)
No response to letter sent after 30 days. Chart sent to Medical Records. °

## 2012-07-08 ENCOUNTER — Encounter: Payer: Self-pay | Admitting: Family Medicine

## 2012-07-08 ENCOUNTER — Ambulatory Visit (INDEPENDENT_AMBULATORY_CARE_PROVIDER_SITE_OTHER): Payer: BC Managed Care – PPO | Admitting: Family Medicine

## 2012-07-08 VITALS — BP 130/90 | HR 100 | Temp 97.9°F | Resp 20 | Wt 198.1 lb

## 2012-07-08 DIAGNOSIS — R42 Dizziness and giddiness: Secondary | ICD-10-CM

## 2012-07-08 DIAGNOSIS — IMO0001 Reserved for inherently not codable concepts without codable children: Secondary | ICD-10-CM

## 2012-07-08 DIAGNOSIS — M797 Fibromyalgia: Secondary | ICD-10-CM

## 2012-07-08 MED ORDER — MECLIZINE HCL 25 MG PO TABS: 25.0000 mg | ORAL_TABLET | ORAL | Status: DC | PRN

## 2012-07-08 MED ORDER — HYDROCODONE-ACETAMINOPHEN 5-325 MG PO TABS: 1.0000 | ORAL_TABLET | Freq: Four times a day (QID) | ORAL | Status: DC | PRN

## 2012-07-08 NOTE — Progress Notes (Signed)
  Subjective:    Patient ID: Katherine Gregory, female    DOB: 1966/03/02, 47 y.o.   MRN: [id number]  HPI Here for dizziness and body aches. She has a hx of migraines, fibromyalgia, and vertigo and sees Dr. Artist Pais. She was seen at Dr. Allayne Stack office last week with a normal workup. They recommended she see a Neurologist. She takes Diazepam for the vertigo, and sometimes it helps and sometimes not. No vomiting. She has had constant dizziness for the past week so she comes in today.    Review of Systems  Constitutional: Negative.   Respiratory: Negative.   Cardiovascular: Negative.   Musculoskeletal: Positive for myalgias.  Neurological: Positive for dizziness. Negative for tremors, seizures, syncope, facial asymmetry, speech difficulty, weakness, light-headedness, numbness and headaches.       Objective:   Physical Exam  Constitutional: She is oriented to person, place, and time.  Very unsteady on her feet, walking with assistance, alert   Cardiovascular: Normal rate, regular rhythm, normal heart sounds and intact distal pulses.   Pulmonary/Chest: Effort normal and breath sounds normal.  Neurological: She is alert and oriented to person, place, and time. No cranial nerve deficit. Coordination abnormal.          Assessment & Plan:  We will add Meclizine to the Diazepam to use prn. Given a small amount of Vicodin for the fibromyalgia. She is scheduled to see Dr. Artist Pais next week, and I think a referral to Neurology at that time would be a good idea.

## 2012-07-10 ENCOUNTER — Telehealth: Payer: Self-pay | Admitting: Internal Medicine

## 2012-07-10 ENCOUNTER — Other Ambulatory Visit: Payer: Self-pay | Admitting: *Deleted

## 2012-07-10 MED ORDER — DIAZEPAM 2 MG PO TABS: 2.0000 mg | ORAL_TABLET | Freq: Three times a day (TID) | ORAL | Status: DC | PRN

## 2012-07-10 NOTE — Telephone Encounter (Signed)
[facility] Triage Call Report Triage Record Num: [id number] Operator: Joni Reining Deal Patient Name: [facility] Call Date & Time: 07/07/2012 9:22:52PM Patient Phone: [phone] PCP: Thomos Lemons Patient Gender: Female PCP Fax : [phone] Patient DOB: Sep 26, 1965 Practice Name: [facility] Reason for Call: Caller: Katherine Gregory/Patient; PCP: Katherine Gregory) (Adults only); CB#: [phone]; Call regarding Tingling, shocks, shaking left arm, dizziness; Diagnosed with Vertigo and Fibromyaligia. Over the last week pt noticed tingling "surges" throughout body, pain in left shoulder, shaking in left arm. Dizziness has become worse. LMP 2/20. Urinated 2130. Pt took brothers Hydrocodone and sister in laws "muscle relaxer" for pain. All emergent sxs r/o per "Dizziness or vertigo" protocol with exception to "Previously evaluated and worsening symptoms interfering with ability to carry out ADL's." Appointment made for 3/1 at 9:45am in [place] office. Protocol(s) Used: Dizziness or Vertigo Recommended Outcome per Protocol: See Provider within 24 hours Reason for Outcome: Previously evaluated and worsening symptoms interfering with ability to carry out activities of daily living (ADLs) Care Advice: ~ DO NOT drive until condition evaluated. ~ Call provider immediately if have difficulty walking, vision problems, or weakness. 02/28/

## 2012-07-11 ENCOUNTER — Ambulatory Visit (INDEPENDENT_AMBULATORY_CARE_PROVIDER_SITE_OTHER): Payer: BC Managed Care – PPO | Admitting: Internal Medicine

## 2012-07-11 ENCOUNTER — Encounter: Payer: Self-pay | Admitting: Internal Medicine

## 2012-07-11 VITALS — BP 114/82 | HR 76 | Temp 97.5°F

## 2012-07-11 DIAGNOSIS — M797 Fibromyalgia: Secondary | ICD-10-CM

## 2012-07-11 DIAGNOSIS — R51 Headache: Secondary | ICD-10-CM

## 2012-07-11 DIAGNOSIS — G8929 Other chronic pain: Secondary | ICD-10-CM

## 2012-07-11 DIAGNOSIS — IMO0001 Reserved for inherently not codable concepts without codable children: Secondary | ICD-10-CM

## 2012-07-11 DIAGNOSIS — R42 Dizziness and giddiness: Secondary | ICD-10-CM

## 2012-07-11 DIAGNOSIS — R519 Other chronic pain: Secondary | ICD-10-CM

## 2012-07-11 MED ORDER — PREGABALIN 50 MG PO CAPS: 50.0000 mg | ORAL_CAPSULE | Freq: Every day | ORAL | Status: DC

## 2012-07-11 NOTE — Assessment & Plan Note (Signed)
Patient has chronic fibromyalgia pain despite taking Cymbalta 60 mg once daily. Add lyrica 50 mg at bedtime.

## 2012-07-11 NOTE — Assessment & Plan Note (Signed)
Patient has persistent intermittent dizziness of unclear etiology. She was seen by ENT. Dr. Ezzard Standing does not feel dizziness secondary to vestibular disorder. I agree with neurology referral.  Arrange consultation with Dr. Debarah Crape
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
## 2012-07-13 ENCOUNTER — Telehealth: Payer: Self-pay | Admitting: Internal Medicine

## 2012-07-13 NOTE — Telephone Encounter (Signed)
Patient Information:  Caller Name: Cesiah  Phone: [phone]  Patient: Katherine Gregory, Katherine Gregory  Gender: Female  DOB: November 01, 1965  Age: 47 Years  PCP: Gershon Crane [facility])  Pregnant: No  Office Follow Up:  Does the office need to follow up with this patient?: Yes  Instructions For The Office: OFFICE FOLLOW UP - Pt wanting to know if she has been tested for MS,  status of Neurology appt  and Handicap Sticker   Symptoms  Reason For Call & Symptoms: Today, 07/13/2012 , pt calling  stating she had OV  Jan 2014  and had lab tests including for  Lupus as well. She had  FU OV 07/11/2012 and was told  lab test was negative for Lupus. Pt is wanting to know if she had  blood lab test that checks for MS.  RN advised that normally   blood test aren't used to diagnose MS .  At OV 07/11/2012  she was  referred to Neurology and wanting to know status of the Neurology appt. Pt also wants to know how to get a Handicap Sticker.  RN advised a note will be sent to Dr. Artist Pais about her inquiry about MS testing, and status of referral appt.  RN also advised she would have to contact [employer]  about Handicap sticker application/ process  Reviewed Health History In EMR: Yes  Reviewed Medications In EMR: Yes  Reviewed Allergies In EMR: Yes  Reviewed Surgeries / Procedures: Yes  Date of Onset of Symptoms: Unknown OB / GYN:  LMP: Unknown  Guideline(s) Used:  No Protocol Available - Information Only  Disposition Per Guideline:   Discuss with PCP and Callback by Nurse Today  Reason For Disposition Reached:   Nursing judgment  Advice Given:  N/A

## 2012-07-13 NOTE — Telephone Encounter (Signed)
Could you call pt and give her status of Neurology referral?

## 2012-07-24 ENCOUNTER — Other Ambulatory Visit: Payer: Self-pay | Admitting: Neurology

## 2012-07-24 DIAGNOSIS — R42 Dizziness and giddiness: Secondary | ICD-10-CM

## 2012-07-26 ENCOUNTER — Other Ambulatory Visit: Payer: Self-pay | Admitting: Neurology

## 2012-07-26 DIAGNOSIS — R42 Dizziness and giddiness: Secondary | ICD-10-CM

## 2012-08-09 ENCOUNTER — Other Ambulatory Visit: Payer: Self-pay

## 2012-08-09 ENCOUNTER — Ambulatory Visit (INDEPENDENT_AMBULATORY_CARE_PROVIDER_SITE_OTHER): Payer: BC Managed Care – PPO

## 2012-08-09 DIAGNOSIS — R42 Dizziness and giddiness: Secondary | ICD-10-CM

## 2012-08-10 ENCOUNTER — Other Ambulatory Visit: Payer: Self-pay

## 2012-08-10 ENCOUNTER — Telehealth: Payer: Self-pay | Admitting: Internal Medicine

## 2012-08-10 MED ORDER — GADOPENTETATE DIMEGLUMINE 469.01 MG/ML IV SOLN: 19.0000 mL | Freq: Once | INTRAVENOUS | Status: AC | PRN

## 2012-08-10 NOTE — Telephone Encounter (Signed)
Patient called stating that she would like a call back with MRI results. Please assist.

## 2012-08-10 NOTE — Procedures (Signed)
[facility]  NEUROIMAGING REPORT   STUDY DATE: 08/09/12 PATIENT NAME: Katherine Gregory DOB: Aug 31, 1965 MRN: [id number]  ORDERING CLINICIAN: Levert Feinstein, MD  CLINICAL HISTORY: 47 year old female with dizziness and gait difficulty.  EXAM: MRI brain (with and without)  TECHNIQUE: MRI of the brain with and without contrast was obtained utilizing 5 mm axial slices with T1, T2, T2 flair, T2 star gradient echo and diffusion weighted views.  T1 sagittal, T2 coronal and postcontrast views in the axial and coronal plane were obtained. CONTRAST: 19ml magnevist IMAGING SITE: [facility] [address]   FINDINGS:  No abnormal lesions are seen on diffusion-weighted views to suggest acute ischemia. The cortical sulci, fissures and cisterns are normal in size and appearance. Lateral, third and fourth ventricle are normal in size and appearance. No extra-axial fluid collections are seen. No evidence of mass effect or midline shift.    No abnormal lesions are seen on post contrast views.  On sagittal views the posterior fossa, pituitary gland and corpus callosum are unremarkable. No evidence of intracranial hemorrhage on gradient-echo views. The orbits and their contents, paranasal sinuses and calvarium are unremarkable.  Intracranial flow voids are present.  IMPRESSION:  Normal MRI brain (with and without).   INTERPRETING PHYSICIAN:  Suanne Marker, MD Certified in Neurology, Neurophysiology and Neuroimaging  [facility] [address] [place], [place] [address] [phone]

## 2012-08-10 NOTE — Progress Notes (Signed)
Quick Note:  Katherine Gregory: Please call pt, MRI cervical showed DJD, no cord lesion, no canal steonsis ______

## 2012-08-10 NOTE — Telephone Encounter (Signed)
MRI of brain was normal.  MRI of neck showed bulging discs at several levels but no spinal cord compression.

## 2012-08-10 NOTE — Telephone Encounter (Signed)
Dr Artist Pais recommended physical therapy for bulging discs.  Pt has an upcoming appt and she stated she would discuss it with him at next f/u

## 2012-08-10 NOTE — Procedures (Signed)
[facility]  NEUROIMAGING REPORT   STUDY DATE: 08/09/12 PATIENT NAME: Katherine Gregory DOB: 04-24-1966 MRN: [id number]  ORDERING CLINICIAN: Levert Feinstein, MD  CLINICAL HISTORY: 47 year old female with headaches and gait difficulty.  EXAM: MRI cervical spine (with and without) TECHNIQUE: MRI of the cervical spine was obtained utilizing 3 mm sagittal slices from the posterior fossa down to the T3-4 level with T1, T2 and inversion recovery views. In addition 4 mm axial slices from C2-3 down to T1-2 level were included with T2 and gradient echo views. CONTRAST: 19ml magnevist IMAGING SITE: [facility] [address]   FINDINGS:  On sagittal views the vertebral bodies have normal height and alignment. Disc bulging at C3-4 to C6-7 and T2-3 to T3-4. The spinal cord is normal in size and appearance. The posterior fossa, pituitary gland and paraspinal soft tissues are unremarkable.    On axial views there is no spinal stenosis or foraminal narrowing. Limited views of the soft tissues of the head and neck are unremarkable.  IMPRESSION:  Mildly abnormal MRI cervical spine (with and without) demonstrating: 1. Disc bulging at C3-4 to C6-7 and T2-3 to T3-4. No spinal stenosis or foraminal narrowing. 2. No intrinsic or enhancing spinal cord lesions.   INTERPRETING PHYSICIAN:  Suanne Marker, MD Certified in Neurology, Neurophysiology and Neuroimaging  [facility] [address] [place], [place] [address] [phone]

## 2012-08-11 ENCOUNTER — Encounter: Payer: Self-pay | Admitting: Internal Medicine

## 2012-08-11 ENCOUNTER — Ambulatory Visit (INDEPENDENT_AMBULATORY_CARE_PROVIDER_SITE_OTHER): Payer: BC Managed Care – PPO | Admitting: Internal Medicine

## 2012-08-11 ENCOUNTER — Ambulatory Visit: Payer: BC Managed Care – PPO | Admitting: Internal Medicine

## 2012-08-11 VITALS — BP 114/80 | HR 84 | Temp 97.8°F | Wt 200.0 lb

## 2012-08-11 DIAGNOSIS — R42 Dizziness and giddiness: Secondary | ICD-10-CM

## 2012-08-11 DIAGNOSIS — IMO0001 Reserved for inherently not codable concepts without codable children: Secondary | ICD-10-CM

## 2012-08-11 DIAGNOSIS — M797 Fibromyalgia: Secondary | ICD-10-CM

## 2012-08-11 MED ORDER — ZOLPIDEM TARTRATE 10 MG PO TABS: 10.0000 mg | ORAL_TABLET | Freq: Every evening | ORAL | Status: DC | PRN

## 2012-08-11 NOTE — Assessment & Plan Note (Signed)
Her musculoskeletal complaints are also worsening. She has tried Lyrica 50 mg at bedtime but is having issues with intermittent edema. Discontinue Lyrica. Continue Cymbalta 60 mg once daily.

## 2012-08-11 NOTE — Progress Notes (Signed)
Subjective:    Patient ID: Katherine Gregory, female    DOB: 01-20-1966, 47 y.o.   MRN: [id number]  HPI  47 year old African American female with unexplained dizziness and severe fibromyalgias for followup. Patient was previously seen by ENT - Dr. Ezzard Standing did not feel her dizziness/vertigo secondary to inner ear disorder. She was referred to neurologist. She was seen by Dr. [name]. She completed MRI of brain and C-spine. MRI of brain was unremarkable. She had incidental bulging discs in her neck.  Patient reports dizziness/unsteadiness has gotten progressively worse. She is currently living with her sister-in-law. Sister in law reports patient needs wheelchair for most activities especially when she outside her home. She is even unable to use walker due to unsteadiness. Her symptoms are debilitating. Patient is unable to work.  Patient also complains of worsening fibromyalgia symptoms. She was prescribed Lyrica. However she has noticed increase intermittent swelling in her arms, legs and feet.  Review of Systems Negative for nausea or vomiting, patient also complains of chronic fatigue and weakness  Past Medical History  Diagnosis Date  . Acid reflux   . Seasonal allergies   . Cellulitis   . GOITER   . URTICARIA   . FATIGUE   . Palpitations   . Dysuria   . FLANK PAIN, RIGHT   . HEADACHES, HX OF   . Fibromyalgia   . Bleeding gastric ulcer     History   Social History  . Marital Status: Single    Spouse Name: N/A    Number of Children: 2  . Years of Education: N/A   Occupational History  . print tech    Social History Main Topics  . Smoking status: Never Smoker   . Smokeless tobacco: Never Used  . Alcohol Use: No     Comment: occassional  . Drug Use: No  . Sexually Active: Yes    Birth Control/ Protection: Surgical   Other Topics Concern  . Not on file   Social History Narrative  . No narrative on file    Past Surgical History  Procedure Laterality Date  .  Cesarean section  1988 & 2008    x 2  . Wisdom tooth extraction    . Cartilage surgery      RT wrist  . Cardiac catheterization      05/17/12 / waiting on results    Family History  Problem Relation Age of Onset  . Hypertension Mother   . Huntington's disease Mother   . Diabetes Father   . Hypertension Father   . Heart disease Father   . Scoliosis Sister   . Huntington's disease Sister   . Diabetes Brother   . Hypertension Brother   . Scoliosis Brother   . Skin cancer Maternal Grandmother     Allergies  Allergen Reactions  . Ibuprofen     "It intensifies my pain."  . Morphine And Related Itching and Other (See Comments)    Reaction burns  . Penicillins Hives and Itching  . Pseudoephedrine Other (See Comments)    Speed up heart rate  . Adhesive (Tape)     Itching and burning at site    Current Outpatient Prescriptions on File Prior to Visit  Medication Sig Dispense Refill  . ALPRAZolam (XANAX) 0.25 MG tablet Take 1 tablet (0.25 mg total) by mouth 2 (two) times daily as needed for sleep or anxiety.  30 tablet  0  . cetirizine (ZYRTEC) 10 MG tablet Take 10 mg by mouth  daily as needed. For allergies      . DULoxetine (CYMBALTA) 60 MG capsule Take 1 capsule (60 mg total) by mouth daily.  90 capsule  1  . esomeprazole (NEXIUM) 40 MG capsule Take 1 capsule (40 mg total) by mouth 2 (two) times daily before a meal.  60 capsule  3  . lubiprostone (AMITIZA) 8 MCG capsule Take 1 capsule (8 mcg total) by mouth 2 (two) times daily with a meal.  28 capsule  0  . meclizine (ANTIVERT) 25 MG tablet Take 1 tablet (25 mg total) by mouth every 4 (four) hours as needed for dizziness.  60 tablet  0   No current facility-administered medications on file prior to visit.    BP 114/80  Pulse 84  Temp(Src) 97.8 F (36.6 C) (Oral)  Wt 200 lb (90.719 kg)  BMI 28.7 kg/m2       Objective:   Physical Exam  Constitutional: She is oriented to person, place, and time. She appears  well-developed and well-nourished.  HENT:  Head: Normocephalic and atraumatic.  Eyes: EOM are normal. Pupils are equal, round, and reactive to light.  Cardiovascular: Normal rate, regular rhythm and normal heart sounds.   No murmur heard. Pulmonary/Chest: Effort normal and breath sounds normal. She has no wheezes.  Neurological: She is alert and oriented to person, place, and time. No cranial nerve deficit.  Psychiatric: She has a normal mood and affect. Her behavior is normal.          Assessment & Plan:

## 2012-08-11 NOTE — Assessment & Plan Note (Signed)
MRI of brain and C-spine was unremarkable. Patient having progressive worsening of dizziness. Her symptoms are completely debilitating. Sometimes her symptoms are worse with changes in position. She has mild orthostasis today. Blood pressure 130/70 sitting and 120/70 standing. Consider autonomic dysfunction. Refer to Dr. Graciela Husbands for further evaluation.

## 2012-08-18 ENCOUNTER — Other Ambulatory Visit: Payer: Self-pay | Admitting: *Deleted

## 2012-08-18 ENCOUNTER — Telehealth: Payer: Self-pay | Admitting: Internal Medicine

## 2012-08-18 MED ORDER — TRAMADOL HCL 50 MG PO TABS: 50.0000 mg | ORAL_TABLET | Freq: Two times a day (BID) | ORAL | Status: DC | PRN

## 2012-08-18 MED ORDER — ESOMEPRAZOLE MAGNESIUM 40 MG PO CPDR: 40.0000 mg | DELAYED_RELEASE_CAPSULE | Freq: Two times a day (BID) | ORAL | Status: DC

## 2012-08-18 NOTE — Telephone Encounter (Signed)
Patient Information:  Caller Name: Minami  Phone: [phone]  Patient: Katherine Gregory, Katherine Gregory  Gender: Female  DOB: 1965/06/08  Age: 47 Years  PCP: Artist Pais, Doe-Hyun Molly Maduro) (Adults only)  Pregnant: No  Office Follow Up:  Does the office need to follow up with this patient?: Yes  Instructions For The Office: Pain states she does not want to take medications not specifically prescribed by Dr. Artist Pais. She is requesting his input on what to do? or Rx pain relief?  She is open to seeing him again if he needs to see her. PLEASE CONTACT PATIENT REGARDING ONGOING PAIN  RN Note:  Pain states she does not want to take medications not specifically prescribed by Dr. Artist Pais. She is requesting his input on what to do? or Rx pain relief?  She is open to seeing him again if he needs to see her. PLEASE CONTACT PATIENT REGARDING ONGOING PAIN  Symptoms  Reason For Call & Symptoms: Patient states her Fibromyalgia is "acting up and been hurting ". Pain is locatted in shoulder and hips. Disc that buldging from MRI.  She is taking left over Hydrocodone to relieve her pain and Tylenol with codiene. She is out of these Medications. . Onset of pain worsening for the last 4 months.  She saw Dr. Artist Pais on 08/11/12 and thought he wrote her a prescription but there is not one at the pharmacy.  She is on Cymbalta which does not take her pain completely away . They Hydrcodone works most of the time but not all the time . Dosage 5/325mg . She reports that Dilaudid she was given in the hospital took all the pain away.. She has had recent heart cath and cardiac work up and denies Chest pain. Allergic to Ibuprofen  Reviewed Health History In EMR: Yes  Reviewed Medications In EMR: Yes  Reviewed Allergies In EMR: Yes  Reviewed Surgeries / Procedures: Yes  Date of Onset of Symptoms: 03/10/2012  Treatments Tried: Cymbalta, left over Hydrocodone 5/325mg   Treatments Tried Worked: No OB / GYN:  LMP: 08/15/2012  Guideline(s) Used:  Shoulder  Pain  Disposition Per Guideline:   See Within 3 Days in Office  Reason For Disposition Reached:   Moderate pain (e.g., interferes with normal activities) and present > 3 days  Advice Given:  Apply Heat to the Area:   Beginning 48 hours after an injury, apply a warm washcloth or heating pad for 10 minutes 3 times a day.  This will help increase blood flow and improve healing.  Heat to Area Forensic scientist option):  If stiffness lasts more than 48 hours, relax under a hot shower for 20 minutes twice a day. Gently exercise the involved part under water.  Rest:   You should try to avoid any exercise or activity that caused this pain for the next 3 days.  Pain Medicines:  For pain relief, you can take either acetaminophen, ibuprofen, or naproxen.  They are over-the-counter (OTC) pain drugs. You can buy them at the drugstore.  Acetaminophen (e.g., Tylenol):  Extra Strength Tylenol: Take 1,000 mg (two 500 mg pills) every 8 hours as needed. Each Extra Strength Tylenol pill has 500 mg of acetaminophen.  Call Back If  You become worse.  RN Overrode Recommendation:  Patient Requests Prescription  Pain states she does not want to take medications not specifically prescribed by Dr. Artist Pais. She is requesting his input on what to do? or Rx pain relief?  She is open to seeing him again if he needs  to see her. PLEASE CONTACT PATIENT REGARDING ONGOING PAIN

## 2012-08-18 NOTE — Telephone Encounter (Signed)
I suggest she try taking tramadol 50 mg # 60 one po bid prn.  RF x 1.  She can take acetaminophen 500 mg at same time.  Reassess in 1 month

## 2012-08-18 NOTE — Telephone Encounter (Signed)
rx sent in electronically, pt aware 

## 2012-08-19 ENCOUNTER — Encounter (HOSPITAL_COMMUNITY): Payer: Self-pay | Admitting: *Deleted

## 2012-08-19 ENCOUNTER — Emergency Department (HOSPITAL_COMMUNITY): Payer: BC Managed Care – PPO

## 2012-08-19 ENCOUNTER — Emergency Department (HOSPITAL_COMMUNITY)
Admission: EM | Admit: 2012-08-19 | Discharge: 2012-08-19 | Disposition: A | Discharge status: Home / Self Care | Payer: BC Managed Care – PPO | Attending: Emergency Medicine | Admitting: Emergency Medicine

## 2012-08-19 DIAGNOSIS — Z8709 Personal history of other diseases of the respiratory system: Secondary | ICD-10-CM | POA: Insufficient documentation

## 2012-08-19 DIAGNOSIS — M25512 Pain in left shoulder: Secondary | ICD-10-CM

## 2012-08-19 DIAGNOSIS — Z872 Personal history of diseases of the skin and subcutaneous tissue: Secondary | ICD-10-CM | POA: Insufficient documentation

## 2012-08-19 DIAGNOSIS — Z862 Personal history of diseases of the blood and blood-forming organs and certain disorders involving the immune mechanism: Secondary | ICD-10-CM | POA: Insufficient documentation

## 2012-08-19 DIAGNOSIS — Z79899 Other long term (current) drug therapy: Secondary | ICD-10-CM | POA: Insufficient documentation

## 2012-08-19 DIAGNOSIS — K219 Gastro-esophageal reflux disease without esophagitis: Secondary | ICD-10-CM | POA: Insufficient documentation

## 2012-08-19 DIAGNOSIS — IMO0001 Reserved for inherently not codable concepts without codable children: Secondary | ICD-10-CM | POA: Insufficient documentation

## 2012-08-19 DIAGNOSIS — Z8639 Personal history of other endocrine, nutritional and metabolic disease: Secondary | ICD-10-CM | POA: Insufficient documentation

## 2012-08-19 DIAGNOSIS — Z8711 Personal history of peptic ulcer disease: Secondary | ICD-10-CM | POA: Insufficient documentation

## 2012-08-19 DIAGNOSIS — R079 Chest pain, unspecified: Secondary | ICD-10-CM | POA: Insufficient documentation

## 2012-08-19 DIAGNOSIS — Z8739 Personal history of other diseases of the musculoskeletal system and connective tissue: Secondary | ICD-10-CM | POA: Insufficient documentation

## 2012-08-19 DIAGNOSIS — M25519 Pain in unspecified shoulder: Secondary | ICD-10-CM | POA: Insufficient documentation

## 2012-08-19 HISTORY — DX: Cervical disc disorder, unspecified, unspecified cervical region: M50.90

## 2012-08-19 MED ORDER — OXYCODONE-ACETAMINOPHEN 5-325 MG PO TABS: 1.0000 | ORAL_TABLET | ORAL | Status: DC | PRN

## 2012-08-19 MED ORDER — OXYCODONE-ACETAMINOPHEN 5-325 MG PO TABS
2.0000 | ORAL_TABLET | Freq: Once | ORAL | Status: AC
  Administered 2012-08-19: 2 via ORAL
  Filled 2012-08-19: qty 2

## 2012-08-19 NOTE — ED Notes (Addendum)
Pt with left sided swelling due to fibromyalgia but left arm weaker and hurts to touch per pt, states warm shower helps, + nausea, denies SOB

## 2012-08-19 NOTE — ED Notes (Signed)
MD at bedside. 

## 2012-08-19 NOTE — ED Notes (Signed)
Pt. W/fibromyalgia experiencing flare.  Also having L lower chest pain, radiating to side.

## 2012-08-19 NOTE — ED Provider Notes (Signed)
History     CSN: [id number]  Arrival date & time 08/19/12  1543   First MD Initiated Contact with Patient 08/19/12 1609      Chief Complaint  Patient presents with  . Fibromyalgia  . Chest Pain     HPI The patient reports worsening left shoulder and left chest pain over the past several days.  She has a history of fibromyalgia and feels as though this is a "flare".  She was also having some pain in her left lateral chest which seems to be improving now.  There was some radiation to the front of her chest.  No prior history of cardiac disease.  She's had a normal heart catheterization before in the past.  No prior history of pulmonary embolism or DVT.  No recent surgery or long travel.  No cough or congestion.  Nothing worsens or improves her pain.  Her pain is moderate to severe at this time.  She's been trying her Lyrica and tramadol home without improvement in her symptoms.   Past Medical History  Diagnosis Date  . Acid reflux   . Seasonal allergies   . Cellulitis   . GOITER   . URTICARIA   . FATIGUE   . Palpitations   . Dysuria   . FLANK PAIN, RIGHT   . HEADACHES, HX OF   . Fibromyalgia   . Bleeding gastric ulcer   . Cervical back pain with evidence of disc disease     Past Surgical History  Procedure Laterality Date  . Cesarean section  1988 & 2008    x 2  . Wisdom tooth extraction    . Cartilage surgery      RT wrist  . Cardiac catheterization      05/17/12 / waiting on results    Family History  Problem Relation Age of Onset  . Hypertension Mother   . Huntington's disease Mother   . Diabetes Father   . Hypertension Father   . Heart disease Father   . Scoliosis Sister   . Huntington's disease Sister   . Diabetes Brother   . Hypertension Brother   . Scoliosis Brother   . Skin cancer Maternal Grandmother     History  Substance Use Topics  . Smoking status: Never Smoker   . Smokeless tobacco: Never Used  . Alcohol Use: No     Comment: occassional     OB History   Grav Para Term Preterm Abortions TAB SAB Ect Mult Living   4 2 2  2 1 1   2       Review of Systems  All other systems reviewed and are negative.    Allergies  Ibuprofen; Morphine and related; Penicillins; Pseudoephedrine; and Adhesive  Home Medications   Current Outpatient Rx  Name  Route  Sig  Dispense  Refill  . amitriptyline (ELAVIL) 10 MG tablet   Oral   Take 10 mg by mouth at bedtime.         . cetirizine (ZYRTEC) 10 MG tablet   Oral   Take 10 mg by mouth daily as needed. For allergies         . diazepam (VALIUM) 2 MG tablet   Oral   Take 2 mg by mouth every 8 (eight) hours as needed for anxiety.         . DULoxetine (CYMBALTA) 60 MG capsule   Oral   Take 1 capsule (60 mg total) by mouth daily.   90 capsule  1   . esomeprazole (NEXIUM) 40 MG capsule   Oral   Take 1 capsule (40 mg total) by mouth 2 (two) times daily before a meal.   60 capsule   3   . meclizine (ANTIVERT) 25 MG tablet   Oral   Take 1 tablet (25 mg total) by mouth every 4 (four) hours as needed for dizziness.   60 tablet   0   . pregabalin (LYRICA) 50 MG capsule   Oral   Take 50 mg by mouth at bedtime.         . traMADol (ULTRAM) 50 MG tablet   Oral   Take 1 tablet (50 mg total) by mouth 2 (two) times daily as needed for pain.   60 tablet   1   . zolpidem (AMBIEN) 10 MG tablet   Oral   Take 1 tablet (10 mg total) by mouth at bedtime as needed for sleep.   30 tablet   3     BP 109/77  Pulse 95  Temp(Src) 97.7 F (36.5 C) (Oral)  Resp 16  Ht 5\' 10"  (1.778 m)  Wt 190 lb (86.183 kg)  BMI 27.26 kg/m2  SpO2 99%  Physical Exam  Nursing note and vitals reviewed. Constitutional: She is oriented to person, place, and time. She appears well-developed and well-nourished. No distress.  HENT:  Head: Normocephalic and atraumatic.  Eyes: EOM are normal.  Neck: Normal range of motion.  Cardiovascular: Normal rate, regular rhythm and normal heart sounds.    Pulmonary/Chest: Effort normal and breath sounds normal.  Abdominal: Soft. She exhibits no distension. There is no tenderness.  Musculoskeletal: Normal range of motion.  Mild pain with range of motion of left shoulder.  No focal tenderness.  No warmth or erythema of her left shoulder.  Normal left radial pulse.  No swelling of her left upper extremities compared to the right upper extremity  Neurological: She is alert and oriented to person, place, and time.  Skin: Skin is warm and dry.  Psychiatric: She has a normal mood and affect. Judgment normal.    ED Course  Procedures (including critical care time)   Date: 08/19/2012  Rate: 75  Rhythm: normal sinus rhythm  QRS Axis: normal  Intervals: normal  ST/T Wave abnormalities: Diffuse T waves in both the inferior, anterior, lateral leads this is unchanged from her prior EKG  Conduction Disutrbances: none  Narrative Interpretation:   Old EKG Reviewed: No significant changes noted since EKG on 04/19/2012   Labs Reviewed - No data to display Dg Chest Portable 1 View  08/19/2012  *RADIOLOGY REPORT*  Clinical Data: Chest pain, fibromyalgia  PORTABLE CHEST - 1 VIEW  Comparison: 04/19/2012  Findings: Mild left basilar atelectasis.  No focal consolidation. No pleural effusion or pneumothorax.  The heart is top normal in size.  IMPRESSION: No evidence of acute cardiopulmonary disease.   Original Report Authenticated By: Charline Bills, M.D.    I personally reviewed the imaging tests through PACS system I reviewed available ER/hospitalization records through the EMR   No diagnosis found.    MDM  The patient's pain seems more musculoskeletal in nature.  Chest x-ray and EKG are normal.  My suspicion for ACS is very low.  She's had normal heart catheterization for the past.  Her EKG is unchanged from prior EKGs.  Home with pain medicine and close followup with her primary care physician.  The patient is PERC negative        Caryn Bee  Larena Glassman, MD 08/19/12 [phone]

## 2012-08-21 ENCOUNTER — Ambulatory Visit (INDEPENDENT_AMBULATORY_CARE_PROVIDER_SITE_OTHER): Payer: BC Managed Care – PPO | Admitting: Neurology

## 2012-08-21 ENCOUNTER — Encounter: Payer: Self-pay | Admitting: Neurology

## 2012-08-21 VITALS — BP 110/80 | HR 80 | Ht 69.0 in

## 2012-08-21 DIAGNOSIS — R42 Dizziness and giddiness: Secondary | ICD-10-CM

## 2012-08-21 DIAGNOSIS — K254 Chronic or unspecified gastric ulcer with hemorrhage: Secondary | ICD-10-CM | POA: Insufficient documentation

## 2012-08-21 DIAGNOSIS — Z87898 Personal history of other specified conditions: Secondary | ICD-10-CM

## 2012-08-21 DIAGNOSIS — M5090 Cervical disc disorder, unspecified, unspecified cervical region: Secondary | ICD-10-CM

## 2012-08-21 DIAGNOSIS — E049 Nontoxic goiter, unspecified: Secondary | ICD-10-CM

## 2012-08-21 NOTE — Progress Notes (Signed)
History of Present Illness  HPI: Katherine Gregory is a 47 years old left-handed African American female, referred by her primary care physician Dr. Domenic Moras, accompanied by her sister-in-law at today's clinical visit.  She reported a history of fibromyalgia, also suffered bleeding gastric ulcer, as  Around summer of 2013, she began to have dizziness episode, described episode of severe vertigo, gait difficulty, nausea, blurry vision, intermittent, getting worse more frequent since January 2014,   Each episode lasts about 2 hours, to 2 days, during those episodes, she needs assistant to help her dressing, ambulating, she also described electronic shooting sensation from her neck down to her body,   She denies bowel and bladder incontinence, she denies visual loss, she has frequent migraine headaches, getting worse over the past few months, 3 times a week, severe holoacranial pounding headache with associated light noise sensitivity, it happened independent of her dizziness spells,   Her gait difficulty getting worse during dizziness attacks, but she continued to have unsteady gait, mobile and bladder incontinence, no persistent sensory loss,  UPDATE April 14th 2014:  She continues to have constellation of complaints, including dizziness, gait difficulty, diffuse body aching pain, left chest pain, went to the emergency room 2 days ago, chest x-ray and EKG was reported normal, review of blood tests from January 2014, normal CBC, CMP, she is referred by her primary care orthopedic surgeon for evaluation of left shoulder pain  MRI of the brain was normal, MRI of cervical showed mild degenerative disc disease, there was no significant canal or foraminal stenosis, no cord signal change  Review of Systems  Out of a complete 14 system review, the patient complains of only the following symptoms, and all other reviewed systems are negative.  Constitutional: Weight gain   Weight loss   Fatigue   Cardiovascular: Chest  pain   Palpitations   Eyes: Blurred vision   Eye pain   Respiratory: Short of breath   Endocrine: Feeling cold   Increased thirst   Neurological: Memory loss   Confusion   Headache   Weakness   Slurred Speech   Dizziness   Tremor   Sleep: Insomnia   ENT: Spinning sensation   Skin: Birthmarks   Moles   Gastrointestinal: Constipation   Musculoskeletal: Joint pain   Allergy/Immunology: Allergies   Psychiatric: Not enough sleep   Decreased energy   Change in appetite   Disinterest in activities        Physical Exam  Neck: supple no carotid bruits Respiratory: clear to auscultation bilaterally Cardiovascular: regular rate rhythm  Neurologic Exam  Mental Status: pleasant, awake, alert, cooperative to history, talking, and casual conversation. Cranial Nerves: CN II-XII pupils were equal round reactive to light.  Fundi were sharp bilaterally.  Extraocular movements were full.  Visual fields were full on confrontational test.  Facial sensation and strength were normal.  Hearing was intact to finger rubbing bilaterally.  Uvula tongue were midline.  Head turning and shoulder shrugging were normal and symmetric.  Tongue protrusion into the cheeks strength were normal.  Motor: Normal tone, bulk, and strength. Sensory: Normal to light touch, pinprick, proprioception, and vibratory sensation. Coordination: Normal finger-to-nose, heel-to-shin.  There was no dysmetria noticed. Gait and Station: cautious, stiff gait.  Romberg sign: Negative Reflexes: Deep tendon reflexes: Biceps: 2/2, Brachioradialis: 2/2, Triceps: 2/2, Pateller: 3/3, Achilles: 2/2.  Plantar responses are flexor.   Assessment and Plan: 47 years old [place] American female, presenting with six-month history of frequent dizziness spells, gait difficulty, frequent migraine  headaches, on examination, she has a be variable effort, essentially normal MRI of the brain and cervical spine  1 physical therapy 2. Inflammatory markers, 3 continue  orthopedic followup 4. Return to clinic with Katherine Gregory in 6 months

## 2012-08-22 ENCOUNTER — Ambulatory Visit: Payer: BC Managed Care – PPO | Admitting: Internal Medicine

## 2012-08-31 ENCOUNTER — Ambulatory Visit (INDEPENDENT_AMBULATORY_CARE_PROVIDER_SITE_OTHER): Payer: BC Managed Care – PPO | Admitting: Family Medicine

## 2012-08-31 ENCOUNTER — Encounter: Payer: Self-pay | Admitting: Family Medicine

## 2012-08-31 ENCOUNTER — Telehealth: Payer: Self-pay | Admitting: Internal Medicine

## 2012-08-31 VITALS — BP 130/84 | HR 82 | Temp 97.8°F

## 2012-08-31 DIAGNOSIS — N644 Mastodynia: Secondary | ICD-10-CM

## 2012-08-31 NOTE — Telephone Encounter (Signed)
Patient Information:  Caller Name: Kaylan  Phone: [phone]  Patient: Katherine Gregory, Katherine Gregory  Gender: Female  DOB: 1965/06/19  Age: 47 Years  PCP: Artist Pais, Doe-Hyun Molly Maduro) (Adults only)  Pregnant: No  Office Follow Up:  Does the office need to follow up with this patient?: No  Instructions For The Office: N/A  RN Note:  States seen in ED 08/19/12 for left "chest/lung/shoulder pain" and was told it might be a fibromyalgia flare.  States shoulder is currently still hurting, with radiation of pain down the left arm as well.  States left breast is also swollen.  Not warm to touch.  Afebrile.  Denies nipple discharge, but states she generally feels more ill than usual.  Per breast symtpoms protocol and chest pain protocol, advised appt today; patient states she will have to discuss a ride with her sister and will call office for appt.  Info to chart.  krs/can  Symptoms  Reason For Call & Symptoms: fibromyalgia flare with pain left breast  Reviewed Health History In EMR: Yes  Reviewed Medications In EMR: Yes  Reviewed Allergies In EMR: Yes  Reviewed Surgeries / Procedures: Yes  Date of Onset of Symptoms: 08/30/2012 OB / GYN:  LMP: Unknown  Guideline(s) Used:  Chest Pain  Disposition Per Guideline:   See Today in Office  Reason For Disposition Reached:   All other patients with chest pain  Advice Given:  N/A  Patient Refused Recommendation:  Patient Will Make Own Appointment  Patient will discuss transportation with sister and call office for appt krs/can

## 2012-08-31 NOTE — Progress Notes (Signed)
Chief Complaint  Patient presents with  . Breast Pain    HPI:  Acute visit for breast pain: -started last few days -just finished period -has fibro and having flare of that - managed by her other doctor -has not noticed any skin changes, redness, nipple changes or discharge -reports several years since last mammo -denies personal hx breast issue or family hx breast cancer -denies fevers, chills, weight changes ROS: See pertinent positives and negatives per HPI.  Past Medical History  Diagnosis Date  . Acid reflux   . Seasonal allergies   . Cellulitis   . GOITER   . URTICARIA   . FATIGUE   . Palpitations   . Dysuria   . FLANK PAIN, RIGHT   . HEADACHES, HX OF   . Fibromyalgia   . Bleeding gastric ulcer   . Cervical back pain with evidence of disc disease     Family History  Problem Relation Age of Onset  . Hypertension Mother   . Huntington's disease Mother   . Diabetes Father   . Hypertension Father   . Heart disease Father   . Scoliosis Sister   . Huntington's disease Sister   . Diabetes Brother   . Hypertension Brother   . Scoliosis Brother   . Skin cancer Maternal Grandmother     History   Social History  . Marital Status: Single    Spouse Name: N/A    Number of Children: 2  . Years of Education: N/A   Occupational History  . print tech    Social History Main Topics  . Smoking status: Never Smoker   . Smokeless tobacco: Never Used  . Alcohol Use: No     Comment: occassional  . Drug Use: No  . Sexually Active: Yes    Birth Control/ Protection: Surgical   Other Topics Concern  . None   Social History Narrative  . None    Current outpatient prescriptions:amitriptyline (ELAVIL) 10 MG tablet, Take 10 mg by mouth at bedtime., Disp: , Rfl: ;  cetirizine (ZYRTEC) 10 MG tablet, Take 10 mg by mouth daily as needed. For allergies, Disp: , Rfl: ;  diazepam (VALIUM) 2 MG tablet, Take 2 mg by mouth every 8 (eight) hours as needed for anxiety., Disp: ,  Rfl: ;  DULoxetine (CYMBALTA) 60 MG capsule, Take 1 capsule (60 mg total) by mouth daily., Disp: 90 capsule, Rfl: 1 esomeprazole (NEXIUM) 40 MG capsule, Take 1 capsule (40 mg total) by mouth 2 (two) times daily before a meal., Disp: 60 capsule, Rfl: 3;  meclizine (ANTIVERT) 25 MG tablet, Take 1 tablet (25 mg total) by mouth every 4 (four) hours as needed for dizziness., Disp: 60 tablet, Rfl: 0;  oxyCODONE-acetaminophen (PERCOCET/ROXICET) 5-325 MG per tablet, Take 1 tablet by mouth every 4 (four) hours as needed for pain., Disp: 20 tablet, Rfl: 0 pregabalin (LYRICA) 50 MG capsule, Take 50 mg by mouth at bedtime., Disp: , Rfl: ;  traMADol (ULTRAM) 50 MG tablet, Take 1 tablet (50 mg total) by mouth 2 (two) times daily as needed for pain., Disp: 60 tablet, Rfl: 1;  zolpidem (AMBIEN) 10 MG tablet, Take 1 tablet (10 mg total) by mouth at bedtime as needed for sleep., Disp: 30 tablet, Rfl: 3  EXAM:  Filed Vitals:   08/31/12 1415  BP: 130/84  Pulse: 82  Temp: 97.8 F (36.6 C)    Body mass index is 0.00 kg/(m^2).  GENERAL: vitals reviewed and listed above, alert, oriented, appears well  hydrated and in no acute distress  HEENT: atraumatic, conjunttiva clear, no obvious abnormalities on inspection of external nose and ears  NECK: no obvious masses on inspection  LUNGS: clear to auscultation bilaterally, no wheezes, rales or rhonchi, good air movement  CV: HRRR, no peripheral edema  BREASTS: bilat mildly inverted nipples - pt reports this is since childhood and unchanged, normal appearance otherwise, normal breast exam except for diffuse TTP diffusely in bother breasts, muscle of neck, arms, chest, no suspicious lesions or LAD  MS: moves all extremities without noticeable abnormality  PSYCH: pleasant and cooperative, no obvious depression or anxiety  ASSESSMENT AND PLAN:  Discussed the following assessment and plan:  Breast pain - Plan: MM Digital Diagnostic Unilat L, MM Digital Screening  Unilat R  -no abnormal exam findings, suspect related to menses and fibro - but will get mammo as due, diagnostic on L - called scheduler to ensure done in timely fashion.  -Patient advised to return or notify a doctor immediately if symptoms worsen or persist or new concerns arise.  Patient Instructions       Terressa Koyanagi.

## 2012-09-04 ENCOUNTER — Ambulatory Visit (INDEPENDENT_AMBULATORY_CARE_PROVIDER_SITE_OTHER): Payer: BC Managed Care – PPO | Admitting: Cardiovascular Disease

## 2012-09-04 VITALS — BP 125/89 | HR 83 | Ht 70.0 in | Wt 171.1 lb

## 2012-09-04 DIAGNOSIS — IMO0001 Reserved for inherently not codable concepts without codable children: Secondary | ICD-10-CM

## 2012-09-04 DIAGNOSIS — R002 Palpitations: Secondary | ICD-10-CM

## 2012-09-04 DIAGNOSIS — R079 Chest pain, unspecified: Secondary | ICD-10-CM

## 2012-09-04 DIAGNOSIS — M797 Fibromyalgia: Secondary | ICD-10-CM

## 2012-09-04 NOTE — Progress Notes (Signed)
Patient ID: Katherine Gregory, female   DOB: [birth year], 47 y.o.   MRN: [id number] 47 year old nonsmoker, nondiabetic female with history of acid reflux. Referred for intermittent central chest pain with radiation to back and left side of chest Seen in ER 7/3 and once prior to that On 7/1 at [facility]. Has no primary. . Patient states her symptoms have persisted and are associated with dizziness, generalized weakness and nausea, she does have acid reflux. Taking Protonix without relief.. Reports constant chest pain and feeling dizzy and weak. No vomiting. No syncope. No falls. Reports home stressors. Denies illicit drugs use. No family history. ECG in ER with mild anterolateral T wave changes. BP also high.  Since last visit BP better.  Stress echo 11/7 with chest pain normal BP response normal resting and stress echo images with baseline abnormal ECG making ECG portion of test nondiagnostic   Cath done 1/9 was normal with no CAD and normal EF  Continues to have some systemic problem that makes her weak, dizzy with some vertigo occasional bladder incontinence. She says she has been tested for MS ? For myesthenia.  Symptoms dysprop. To fibromyalgia  ROS: Denies fever, malais, weight loss, blurry vision, decreased visual acuity, cough, sputum, SOB, hemoptysis, pleuritic pain, palpitaitons, heartburn, abdominal pain, melena, lower extremity edema, claudication, or rash.  All other systems reviewed and negative  General: Affect appropriate Healthy:  appears stated age HEENT: normal Neck supple with no adenopathy JVP normal no bruits no thyromegaly Lungs clear with no wheezing and good diaphragmatic motion Heart:  S1/S2 no murmur, no rub, gallop or click PMI normal Abdomen: benighn, BS positve, no tenderness, no AAA no bruit.  No HSM or HJR Distal pulses intact with no bruits No edema Neuro non-focal Skin warm and dry No muscular weakness   Current Outpatient Prescriptions  Medication Sig  Dispense Refill  . amitriptyline (ELAVIL) 10 MG tablet Take 10 mg by mouth at bedtime.      . cetirizine (ZYRTEC) 10 MG tablet Take 10 mg by mouth daily as needed. For allergies      . diazepam (VALIUM) 2 MG tablet Take 2 mg by mouth every 8 (eight) hours as needed for anxiety.      . DULoxetine (CYMBALTA) 60 MG capsule Take 1 capsule (60 mg total) by mouth daily.  90 capsule  1  . esomeprazole (NEXIUM) 40 MG capsule Take 1 capsule (40 mg total) by mouth 2 (two) times daily before a meal.  60 capsule  3  . meclizine (ANTIVERT) 25 MG tablet Take 1 tablet (25 mg total) by mouth every 4 (four) hours as needed for dizziness.  60 tablet  0  . oxyCODONE-acetaminophen (PERCOCET/ROXICET) 5-325 MG per tablet Take 1 tablet by mouth every 4 (four) hours as needed for pain.  20 tablet  0  . pregabalin (LYRICA) 50 MG capsule Take 50 mg by mouth at bedtime.      . traMADol (ULTRAM) 50 MG tablet Take 1 tablet (50 mg total) by mouth 2 (two) times daily as needed for pain.  60 tablet  1  . zolpidem (AMBIEN) 10 MG tablet Take 1 tablet (10 mg total) by mouth at bedtime as needed for sleep.  30 tablet  3   No current facility-administered medications for this visit.    Allergies  Ibuprofen; Morphine and related; Penicillins; Pseudoephedrine; and Adhesive  Electrocardiogram: SR rate 71 diffuse T wave changes  Assessment and Plan

## 2012-09-04 NOTE — Assessment & Plan Note (Signed)
Constilation of unexplained symptoms to include weakness, dizzyness, gait instability and some bladder issues. Should be referred to tertiary center for auto immune  Or neurologic w/u  Inflamatory labs ordered but not done yet

## 2012-09-04 NOTE — Assessment & Plan Note (Signed)
Non cardiac cath normal in January

## 2012-09-04 NOTE — Assessment & Plan Note (Signed)
Benign resolved not clinically significant

## 2012-09-04 NOTE — Patient Instructions (Signed)
Your physician recommends that you schedule a follow-up appointment in: AS NEEDED  Your physician recommends that you continue on your current medications as directed. Please refer to the Current Medication list given to you today.  

## 2012-09-05 ENCOUNTER — Ambulatory Visit: Payer: BC Managed Care – PPO | Attending: Neurology

## 2012-09-05 DIAGNOSIS — R262 Difficulty in walking, not elsewhere classified: Secondary | ICD-10-CM | POA: Insufficient documentation

## 2012-09-05 DIAGNOSIS — Z9181 History of falling: Secondary | ICD-10-CM | POA: Insufficient documentation

## 2012-09-05 DIAGNOSIS — R279 Unspecified lack of coordination: Secondary | ICD-10-CM | POA: Insufficient documentation

## 2012-09-05 DIAGNOSIS — IMO0001 Reserved for inherently not codable concepts without codable children: Secondary | ICD-10-CM | POA: Insufficient documentation

## 2012-09-07 ENCOUNTER — Telehealth: Payer: Self-pay | Admitting: Internal Medicine

## 2012-09-07 DIAGNOSIS — R51 Headache: Secondary | ICD-10-CM

## 2012-09-07 DIAGNOSIS — R42 Dizziness and giddiness: Secondary | ICD-10-CM

## 2012-09-07 NOTE — Telephone Encounter (Signed)
Should be seen at tertiary center like [facility] 2nd opinion neuro or rheum  No good local rheum people

## 2012-09-07 NOTE — Telephone Encounter (Signed)
Ok for referral to [facility] neuro for second opinion.

## 2012-09-07 NOTE — Telephone Encounter (Signed)
Note forwarded to Dr Eden Emms for advice

## 2012-09-07 NOTE — Telephone Encounter (Signed)
Patient called stating that per the Cardiologist's suggestion she would like a neurologist and a rheumatologist this is also the decision of the patient.

## 2012-09-07 NOTE — Telephone Encounter (Signed)
Patient was already seen by neurologist.  Please ask cardiologist re: thoughts about seeing rheumatologist.

## 2012-09-08 NOTE — Telephone Encounter (Signed)
Referral order placed.

## 2012-09-11 ENCOUNTER — Ambulatory Visit: Payer: BC Managed Care – PPO | Attending: Neurology | Admitting: Physical Therapy

## 2012-09-11 DIAGNOSIS — IMO0001 Reserved for inherently not codable concepts without codable children: Secondary | ICD-10-CM | POA: Insufficient documentation

## 2012-09-11 DIAGNOSIS — R262 Difficulty in walking, not elsewhere classified: Secondary | ICD-10-CM | POA: Insufficient documentation

## 2012-09-11 DIAGNOSIS — R279 Unspecified lack of coordination: Secondary | ICD-10-CM | POA: Insufficient documentation

## 2012-09-11 DIAGNOSIS — Z9181 History of falling: Secondary | ICD-10-CM | POA: Insufficient documentation

## 2012-09-12 ENCOUNTER — Telehealth: Payer: Self-pay | Admitting: Internal Medicine

## 2012-09-12 NOTE — Telephone Encounter (Signed)
Pt aware.

## 2012-09-12 NOTE — Telephone Encounter (Signed)
Yes ok to hold vestibular rehab

## 2012-09-12 NOTE — Telephone Encounter (Signed)
Caller: Katherine Gregory/Patient; Phone: [phone]; Reason for Call: Patient's current therapist recommends stopping her treatments right now until doctors determine the cause of her vertigo.  Patient wanted to see if Dr.  Artist Pais agreed with this as well.  Please contact patient regarding her question.

## 2012-09-13 ENCOUNTER — Ambulatory Visit: Payer: BC Managed Care – PPO

## 2012-09-14 ENCOUNTER — Ambulatory Visit
Admission: RE | Admit: 2012-09-14 | Discharge: 2012-09-14 | Disposition: A | Discharge status: Home / Self Care | Payer: BC Managed Care – PPO | Source: Ambulatory Visit | Attending: Family Medicine | Admitting: Family Medicine

## 2012-09-14 ENCOUNTER — Other Ambulatory Visit: Payer: Self-pay | Admitting: Family Medicine

## 2012-09-14 DIAGNOSIS — N644 Mastodynia: Secondary | ICD-10-CM

## 2012-09-19 ENCOUNTER — Telehealth: Payer: Self-pay | Admitting: Internal Medicine

## 2012-09-19 NOTE — Telephone Encounter (Signed)
Patient Information:  Caller Name: Romana  Phone: [phone]  Patient: Katherine Gregory, Katherine Gregory  Gender: Female  DOB: Nov 18, 1965  Age: 47 Years  PCP: Artist Pais, Doe-Hyun Molly Maduro) (Adults only)  Pregnant: No  Office Follow Up:  Does the office need to follow up with this patient?: No  Instructions For The Office: N/A  RN Note:  She is going to discuss with her family regarding transportation and call back to schedule an appt for evaluation.  Symptoms  Reason For Call & Symptoms: Patient states two weeks ago she felt "a pop,sharp pain" in her lower right abdomen.  The discomfort lasted  "a few minutes". She had no further discomfort but noticed she was having constipation. She changed her diet to add more fruit, eating bran every morning.  She notices now that her BM are larger.  They are not hard to pass , not painful to have BM. She is concerned that they are larger (size).  BM X 2 daily .  She describes ongoing nausea and 19lb weight loss 2 months.. She is currently having issues with vertigo and fibromyalgia.that she is beiing treated for.  No bloating or discomfort.  Reviewed Health History In EMR: Yes  Reviewed Medications In EMR: Yes  Reviewed Allergies In EMR: Yes  Reviewed Surgeries / Procedures: Yes  Date of Onset of Symptoms: 09/05/2012  Treatments Tried: Bran, dietary change  Treatments Tried Worked: No OB / GYN:  LMP: 09/12/2012  Guideline(s) Used:  Constipation  Disposition Per Guideline:   See Within 3 Days in Office  Reason For Disposition Reached:   Weight loss greater than 10 pounds (5 kg) and not dieting  Advice Given:  Liquids:  Drink 6-8 glasses of water a day (Caution: certain medical conditions require fluid restriction).  Avoid alcohol.  High Fiber Diet:  Try to eat fresh fruit and vegetables at each meal (peas, prunes, citrus, apples, beans, corn).  Eat more grain foods (bran flakes, bran muffins, graham crackers, oatmeal, brown rice, and whole wheat bread).  Popcorn is a source of fiber.  General Constipation Instructions:  Eat a high fiber diet.  Drink adequate liquids.  Don't ignore your body's signals to have a BM.  Get into a rhythm:   Try to have a BM at the same time every day. The best time is about 30-60 minutes after breakfast or another meal (Reason: natural increased intestinal activity).  Do not ignore your body's signals to have a BM.  Call Back If:  Constipation continues (i.e., less than 3 BMs / week or straining more than 25% of the time) after following care advice for constipation for 2 weeks  You become worse  RN Overrode Recommendation:  Follow Up With Office Later  Working on [employer]

## 2012-09-19 NOTE — Telephone Encounter (Signed)
Pt was transferred by triage to me. Pt has enlarged bowels and stomach pain. Would like to come in Thurs. Only same day appts.. Pls advise

## 2012-09-19 NOTE — Telephone Encounter (Signed)
Per Dr Artist Pais ok for Thursday

## 2012-09-20 NOTE — Telephone Encounter (Signed)
lmom for pt to call back

## 2012-09-20 NOTE — Telephone Encounter (Signed)
appt set/kh 

## 2012-09-21 ENCOUNTER — Encounter (HOSPITAL_COMMUNITY): Payer: Self-pay | Admitting: *Deleted

## 2012-09-21 ENCOUNTER — Telehealth: Payer: Self-pay | Admitting: Internal Medicine

## 2012-09-21 ENCOUNTER — Emergency Department (HOSPITAL_COMMUNITY)
Admission: EM | Admit: 2012-09-21 | Discharge: 2012-09-21 | Disposition: A | Discharge status: Home / Self Care | Payer: BC Managed Care – PPO | Attending: Emergency Medicine | Admitting: Emergency Medicine

## 2012-09-21 ENCOUNTER — Ambulatory Visit: Payer: BC Managed Care – PPO | Admitting: Internal Medicine

## 2012-09-21 ENCOUNTER — Ambulatory Visit: Payer: BC Managed Care – PPO

## 2012-09-21 DIAGNOSIS — Z8709 Personal history of other diseases of the respiratory system: Secondary | ICD-10-CM | POA: Insufficient documentation

## 2012-09-21 DIAGNOSIS — Z79899 Other long term (current) drug therapy: Secondary | ICD-10-CM | POA: Insufficient documentation

## 2012-09-21 DIAGNOSIS — K219 Gastro-esophageal reflux disease without esophagitis: Secondary | ICD-10-CM | POA: Insufficient documentation

## 2012-09-21 DIAGNOSIS — IMO0001 Reserved for inherently not codable concepts without codable children: Secondary | ICD-10-CM | POA: Insufficient documentation

## 2012-09-21 DIAGNOSIS — Z862 Personal history of diseases of the blood and blood-forming organs and certain disorders involving the immune mechanism: Secondary | ICD-10-CM | POA: Insufficient documentation

## 2012-09-21 DIAGNOSIS — Z872 Personal history of diseases of the skin and subcutaneous tissue: Secondary | ICD-10-CM | POA: Insufficient documentation

## 2012-09-21 DIAGNOSIS — R110 Nausea: Secondary | ICD-10-CM | POA: Insufficient documentation

## 2012-09-21 DIAGNOSIS — M791 Myalgia, unspecified site: Secondary | ICD-10-CM

## 2012-09-21 DIAGNOSIS — Z87448 Personal history of other diseases of urinary system: Secondary | ICD-10-CM | POA: Insufficient documentation

## 2012-09-21 DIAGNOSIS — Z8639 Personal history of other endocrine, nutritional and metabolic disease: Secondary | ICD-10-CM | POA: Insufficient documentation

## 2012-09-21 DIAGNOSIS — Z8679 Personal history of other diseases of the circulatory system: Secondary | ICD-10-CM | POA: Insufficient documentation

## 2012-09-21 DIAGNOSIS — M25559 Pain in unspecified hip: Secondary | ICD-10-CM | POA: Insufficient documentation

## 2012-09-21 DIAGNOSIS — Z8711 Personal history of peptic ulcer disease: Secondary | ICD-10-CM | POA: Insufficient documentation

## 2012-09-21 DIAGNOSIS — R071 Chest pain on breathing: Secondary | ICD-10-CM | POA: Insufficient documentation

## 2012-09-21 DIAGNOSIS — Z8739 Personal history of other diseases of the musculoskeletal system and connective tissue: Secondary | ICD-10-CM | POA: Insufficient documentation

## 2012-09-21 LAB — POCT I-STAT, CHEM 8
BUN: 4 mg/dL — ABNORMAL LOW (ref 6–23)
Calcium, Ion: 1.19 mmol/L (ref 1.12–1.23)
Chloride: 105 mEq/L (ref 96–112)
Creatinine, Ser: 0.8 mg/dL (ref 0.50–1.10)
Glucose, Bld: 118 mg/dL — ABNORMAL HIGH (ref 70–99)
HCT: 43 % (ref 36.0–46.0)
Hemoglobin: 14.6 g/dL (ref 12.0–15.0)
Potassium: 4.2 mEq/L (ref 3.5–5.1)
Sodium: 137 mEq/L (ref 135–145)
TCO2: 27 mmol/L (ref 0–100)

## 2012-09-21 MED ORDER — SODIUM CHLORIDE 0.9 % IV BOLUS (SEPSIS)
1000.0000 mL | Freq: Once | INTRAVENOUS | Status: AC
  Administered 2012-09-21: 1000 mL via INTRAVENOUS

## 2012-09-21 MED ORDER — ONDANSETRON HCL 4 MG/2ML IJ SOLN
4.0000 mg | Freq: Once | INTRAMUSCULAR | Status: AC
  Administered 2012-09-21: 4 mg via INTRAVENOUS
  Filled 2012-09-21: qty 2

## 2012-09-21 MED ORDER — TRAMADOL HCL 50 MG PO TABS
50.0000 mg | ORAL_TABLET | Freq: Once | ORAL | Status: AC
  Administered 2012-09-21: 50 mg via ORAL
  Filled 2012-09-21: qty 1

## 2012-09-21 MED ORDER — TRAMADOL HCL 50 MG PO TABS: 50.0000 mg | ORAL_TABLET | Freq: Two times a day (BID) | ORAL | Status: DC | PRN

## 2012-09-21 NOTE — Telephone Encounter (Signed)
Patient Information:  Caller Name: Melisssa  Phone: [phone]  Patient: Katherine Gregory, Katherine Gregory  Gender: Female  DOB: [birth year]  Age: 47 Years  PCP: Artist Pais, Doe-Hyun Molly Maduro) (Adults only)  Pregnant: No  Office Follow Up:  Does the office need to follow up with this patient?: No  Instructions For The Office: N/A  RN Note:  Caller sounds very distressed with severe nausea, new onset of upper abdominal pain and weakness. Syncope episode 09/20/12.  Lives in [place]; will go to either [facility] or [facility] ED now.  Symptoms  Reason For Call & Symptoms: Colon and lower abdominal pain/cramping with nausea.  New onset of upper abdominal Pain rated 5-6/10.  Last BM 09/21/12.  Scheduled for 09/21/12 at 1400 but called because symptoms are worse. Feels hot and off balance.  Fell to floor 09/20/12 due to dizziness. Reports feels weak and very nauseated.  Feels hot and cold but no thermometer.  Reviewed Health History In EMR: Yes  Reviewed Medications In EMR: Yes  Reviewed Allergies In EMR: Yes  Reviewed Surgeries / Procedures: Yes  Date of Onset of Symptoms: 09/20/2012  Treatments Tried: Ginger Ale, tramadol  Treatments Tried Worked: No OB / GYN:  LMP: 09/10/2012  Guideline(s) Used:  Abdominal Pain - Female  Abdominal Pain - Upper  Disposition Per Guideline:   Go to ED Now  Reason For Disposition Reached:   Pain lasting > 10 minutes and over 77 years old and at least one cardiac risk factor  Advice Given:  N/A  Patient Will Follow Care Advice:  YES

## 2012-09-21 NOTE — ED Notes (Signed)
Patient states abdominal pain and chest pain starting this am, patient with nausea, denies vomiting/sob,  Patient states last bm this am but states it was small and hardened

## 2012-09-21 NOTE — ED Provider Notes (Signed)
History     CSN: [id number]  Arrival date & time 09/21/12  1149   First MD Initiated Contact with Patient 09/21/12 1227      Chief Complaint  Patient presents with  . Abdominal Pain  . Chest Pain    (Consider location/radiation/quality/duration/timing/severity/associated sxs/prior treatment) HPI  47 year old female with history of fibromyalgia presents complaining of body aches. Patient states for the past several days she has had pain to the chest abdomen and to her legs. Pain is a sharp aching sensation worsening with palpation or movement. Endorse extreme nausea without vomiting or diarrhea. States her pain is likely due to "fibromyalgia flare". She usually takes Cymbalta and Ultram for her but has not taken it this morning. She reports while walking yesterday she felt weak, fell forward and hitting her right thigh against the ground. She was able to walk afterward but continued to endorse pain. She also complaining of left breast pain. Denies any nipple discharge, or rash. Has had pain to her left breast in the past which her doctor felt was related to her fibromyalgia. Otherwise patient denies fever, headache, neck stiffness, numbness, or rash. Her last bowel movement was this morning.  Past Medical History  Diagnosis Date  . Acid reflux   . Seasonal allergies   . Cellulitis   . GOITER   . URTICARIA   . FATIGUE   . Palpitations   . Dysuria   . FLANK PAIN, RIGHT   . HEADACHES, HX OF   . Fibromyalgia   . Bleeding gastric ulcer   . Cervical back pain with evidence of disc disease     Past Surgical History  Procedure Laterality Date  . Cesarean section  1988 & 2008    x 2  . Wisdom tooth extraction    . Cartilage surgery      RT wrist  . Cardiac catheterization      05/17/12 / waiting on results    Family History  Problem Relation Age of Onset  . Hypertension Mother   . Huntington's disease Mother   . Diabetes Father   . Hypertension Father   . Heart disease  Father   . Scoliosis Sister   . Huntington's disease Sister   . Diabetes Brother   . Hypertension Brother   . Scoliosis Brother   . Skin cancer Maternal Grandmother     History  Substance Use Topics  . Smoking status: Never Smoker   . Smokeless tobacco: Never Used  . Alcohol Use: No     Comment: occassional    OB History   Grav Para Term Preterm Abortions TAB SAB Ect Mult Living   4 2 2  2 1 1   2       Review of Systems  Constitutional:       10 Systems reviewed and all are negative for acute change except as noted in the HPI.     Allergies  Ibuprofen; Morphine and related; Penicillins; Pseudoephedrine; and Adhesive  Home Medications   Current Outpatient Rx  Name  Route  Sig  Dispense  Refill  . acetaminophen (TYLENOL) 500 MG tablet   Oral   Take 500 mg by mouth 2 (two) times daily as needed for pain.         . cetirizine (ZYRTEC) 10 MG tablet   Oral   Take 10 mg by mouth daily as needed. For allergies         . diazepam (VALIUM) 2 MG tablet   Oral  Take 2 mg by mouth every 8 (eight) hours as needed for anxiety.         . DULoxetine (CYMBALTA) 60 MG capsule   Oral   Take 1 capsule (60 mg total) by mouth daily.   90 capsule   1   . esomeprazole (NEXIUM) 40 MG capsule   Oral   Take 1 capsule (40 mg total) by mouth 2 (two) times daily before a meal.   60 capsule   3   . meclizine (ANTIVERT) 25 MG tablet   Oral   Take 1 tablet (25 mg total) by mouth every 4 (four) hours as needed for dizziness.   60 tablet   0   . traMADol (ULTRAM) 50 MG tablet   Oral   Take 1 tablet (50 mg total) by mouth 2 (two) times daily as needed for pain.   60 tablet   1   . zolpidem (AMBIEN) 10 MG tablet   Oral   Take 1 tablet (10 mg total) by mouth at bedtime as needed for sleep.   30 tablet   3     BP 132/88  Pulse 85  Temp(Src) 98.1 F (36.7 C) (Oral)  Resp 16  SpO2 100%  LMP 09/13/2012  Physical Exam  Nursing note and vitals  reviewed. Constitutional: She is oriented to person, place, and time. She appears well-developed and well-nourished. She appears distressed (patient is tearful, sobbing).  HENT:  Head: Normocephalic and atraumatic.  Mouth/Throat: Oropharynx is clear and moist.  Eyes: Conjunctivae are normal.  Neck: Normal range of motion. Neck supple.  Cardiovascular: Normal rate and regular rhythm.   Pulmonary/Chest: Effort normal and breath sounds normal. She has no wheezes. She exhibits tenderness (Diffuse chest wall tenderness on palpation without crepitus, emphysema, overlying skin changes).  Abdominal: Soft. There is tenderness (Generalized abdominal tenderness on palpation without guarding or rebound tenderness. No hernia noted).  Musculoskeletal: She exhibits tenderness (patient is hypersensitive, tenderness to just about any other body parts with light palpation.  Right hip with normal range of motion. No overlying skin changes over the right thigh).  Neurological: She is alert and oriented to person, place, and time.  Skin: Skin is warm. No rash noted.  Psychiatric: She has a normal mood and affect.    ED Course  Procedures (including critical care time)  12:20 PM  Date: 09/21/2012  Rate: 89  Rhythm: normal sinus rhythm  QRS Axis: normal  Intervals: normal  ST/T Wave abnormalities: Inverted T waves in inferior and lateral leads.  Conduction Disutrbances:none  Narrative Interpretation: Abnormal EKG  Old EKG Reviewed: unchanged  1:11 PM Patient wished generalized pain. Does not sound cardiac related. Likely musculoskeletal or likely related to her fibromyalgia. She has had multiple ER visits for exam. Patient states she feels dehydrated although she has good skin turgor, and normal vitals.  3:02 PM Giving patient's IV fluid, pain medication, and Zofran. Patient states she feels much better. She is able to ambulate. She is able to tolerates by mouth. She is stable for discharge. Patient  agrees to followup with her primary care Dr. for further care. Return precautions discussed. Her labs otherwise unremarkable.  Labs Reviewed  POCT I-STAT, CHEM 8 - Abnormal; Notable for the following:    BUN 4 (*)    Glucose, Bld 118 (*)    All other components within normal limits   No results found.   1. Thigh pain, right   2. Muscle ache   3. Nausea  MDM  BP 135/88  Pulse 78  Temp(Src) 98.1 F (36.7 C) (Oral)  Resp 17  SpO2 100%  LMP 09/13/2012  I have reviewed nursing notes and vital signs.  I reviewed available ER/hospitalization records thought the EMR         Fayrene Helper, [place] 09/21/12 1503

## 2012-09-21 NOTE — ED Notes (Signed)
Pt ambulated to restroom with assistance.  Upon return to room, pt had a near syncopal episode where she suddenly got dizzy.  Pt assisted back to bed with assistance from RN and sister-in-law.

## 2012-09-21 NOTE — ED Provider Notes (Signed)
12:20 PM  Date: 09/21/2012  Rate: 89  Rhythm: normal sinus rhythm  QRS Axis: normal  Intervals: normal  ST/T Wave abnormalities: Inverted T waves in inferior and lateral leads.  Conduction Disutrbances:none  Narrative Interpretation: Abnormal EKG  Old EKG Reviewed: unchanged    Carleene Cooper III, MD 09/21/12 [phone]

## 2012-09-21 NOTE — ED Provider Notes (Signed)
Medical screening examination/treatment/procedure(s) were performed by non-physician practitioner and as supervising physician I was immediately available for consultation/collaboration.   Carleene Cooper III, MD 09/21/12 [phone]

## 2012-09-22 ENCOUNTER — Ambulatory Visit: Payer: BC Managed Care – PPO

## 2012-09-27 ENCOUNTER — Ambulatory Visit: Payer: BC Managed Care – PPO | Admitting: Physical Therapy

## 2012-09-28 ENCOUNTER — Ambulatory Visit: Payer: BC Managed Care – PPO

## 2012-09-29 ENCOUNTER — Encounter: Payer: BC Managed Care – PPO | Admitting: Obstetrics and Gynecology

## 2012-10-03 ENCOUNTER — Ambulatory Visit: Payer: BC Managed Care – PPO | Admitting: Physical Therapy

## 2012-10-04 ENCOUNTER — Ambulatory Visit: Payer: BC Managed Care – PPO | Admitting: Physical Therapy

## 2012-10-12 ENCOUNTER — Ambulatory Visit: Payer: BC Managed Care – PPO | Admitting: Internal Medicine

## 2012-10-16 ENCOUNTER — Encounter: Payer: Self-pay | Admitting: Internal Medicine

## 2012-10-16 ENCOUNTER — Ambulatory Visit: Payer: BC Managed Care – PPO

## 2012-10-16 ENCOUNTER — Ambulatory Visit (INDEPENDENT_AMBULATORY_CARE_PROVIDER_SITE_OTHER): Payer: Self-pay | Admitting: Internal Medicine

## 2012-10-16 VITALS — BP 114/80 | HR 80 | Temp 98.0°F | Wt 202.0 lb

## 2012-10-16 DIAGNOSIS — R42 Dizziness and giddiness: Secondary | ICD-10-CM

## 2012-10-16 DIAGNOSIS — M797 Fibromyalgia: Secondary | ICD-10-CM

## 2012-10-16 DIAGNOSIS — IMO0001 Reserved for inherently not codable concepts without codable children: Secondary | ICD-10-CM

## 2012-10-16 MED ORDER — PREGABALIN 50 MG PO CAPS: 50.0000 mg | ORAL_CAPSULE | Freq: Every evening | ORAL | Status: DC

## 2012-10-16 MED ORDER — DULOXETINE HCL 60 MG PO CPEP: 60.0000 mg | ORAL_CAPSULE | Freq: Every day | ORAL | Status: DC

## 2012-10-16 MED ORDER — CLONAZEPAM 1 MG PO TABS: ORAL_TABLET | ORAL | Status: DC

## 2012-10-16 NOTE — Assessment & Plan Note (Addendum)
Patient having persistent unexplained muscle pains. Proceed with referral to tertiary medical center-neurologist. Retry Lyrica 50 mg at bedtime.  Continue same dose of Cymbalta 60 mg. Patient urged to obtain pill box to help with medication compliance.  Patient struggling with poor sleep quality despite using Ambien. Discontinue zolpidem. Switch to clonazepam 1 mg at bedtime.

## 2012-10-16 NOTE — Progress Notes (Signed)
Subjective:    Patient ID: Katherine Gregory, female    DOB: 1965-09-03, 47 y.o.   MRN: [id number]  HPI  47 year old African American female with unexplained dizziness/vertigo and severe fibromyalgia for followup. Interval medical history-patient was seen on May15, 2014 in emergency room secondary to complaints of pains in her chest abdomen and legs. Patient reports she may have forgotten to take her Cymbalta for 2-3 days. She felt dizzy, nauseated and experienced "electric sensation" in her chest and neck area.  She is still awaiting referral to neurologist about this [facility].  Patient continues to experience severe unexplained muscle symptoms. She feels especially stiff and sore the morning. "I walk like an old lady". Her symptoms worse with exertion. Her symptoms somewhat worse in her left arm and right hip.   Review of Systems  Patient feels like her fingers are swollen. She was tried on Cymbalta in the past but discontinued due to concerns about fluid retention. However,her swelling unaffected by medication use.  Poor sleep quality even with using Ambien.  Past Medical History  Diagnosis Date  . Acid reflux   . Seasonal allergies   . Cellulitis   . GOITER   . URTICARIA   . FATIGUE   . Palpitations   . Dysuria   . FLANK PAIN, RIGHT   . HEADACHES, HX OF   . Fibromyalgia   . Bleeding gastric ulcer   . Cervical back pain with evidence of disc disease     History   Social History  . Marital Status: Single    Spouse Name: N/A    Number of Children: 2  . Years of Education: N/A   Occupational History  . print tech    Social History Main Topics  . Smoking status: Never Smoker   . Smokeless tobacco: Never Used  . Alcohol Use: No     Comment: occassional  . Drug Use: No  . Sexually Active: Yes    Birth Control/ Protection: Surgical   Other Topics Concern  . Not on file   Social History Narrative  . No narrative on file    Past Surgical History  Procedure  Laterality Date  . Cesarean section  1988 & 2008    x 2  . Wisdom tooth extraction    . Cartilage surgery      RT wrist  . Cardiac catheterization      05/17/12 / waiting on results    Family History  Problem Relation Age of Onset  . Hypertension Mother   . Huntington's disease Mother   . Diabetes Father   . Hypertension Father   . Heart disease Father   . Scoliosis Sister   . Huntington's disease Sister   . Diabetes Brother   . Hypertension Brother   . Scoliosis Brother   . Skin cancer Maternal Grandmother     Allergies  Allergen Reactions  . Ibuprofen     "It intensifies my pain."  . Morphine And Related Itching and Other (See Comments)    Reaction burns  . Penicillins Hives and Itching  . Pseudoephedrine Other (See Comments)    Speed up heart rate  . Adhesive (Tape)     Itching and burning at site    Current Outpatient Prescriptions on File Prior to Visit  Medication Sig Dispense Refill  . acetaminophen (TYLENOL) 500 MG tablet Take 500 mg by mouth 2 (two) times daily as needed for pain.      . cetirizine (ZYRTEC) 10 MG  tablet Take 10 mg by mouth daily as needed. For allergies      . diazepam (VALIUM) 2 MG tablet Take 2 mg by mouth every 8 (eight) hours as needed for anxiety.      [name] esomeprazole (NEXIUM) 40 MG capsule Take 1 capsule (40 mg total) by mouth 2 (two) times daily before a meal.  60 capsule  3  . meclizine (ANTIVERT) 25 MG tablet Take 1 tablet (25 mg total) by mouth every 4 (four) hours as needed for dizziness.  60 tablet  0  . traMADol (ULTRAM) 50 MG tablet Take 1 tablet (50 mg total) by mouth 2 (two) times daily as needed for pain.  15 tablet  0   No current facility-administered medications on file prior to visit.    BP 114/80  Pulse 80  Temp(Src) 98 F (36.7 C) (Oral)  Wt 202 lb (91.627 kg)  BMI 28.98 kg/m2  LMP 09/13/2012       Objective:   Physical Exam  Constitutional: She is oriented to person, place, and time. She appears  well-developed and well-nourished.  HENT:  Head: Normocephalic and atraumatic.  Right Ear: External ear normal.  Left Ear: External ear normal.  Cardiovascular: Normal rate, regular rhythm and normal heart sounds.   No murmur heard. Pulmonary/Chest: Breath sounds normal. She has no wheezes.  Musculoskeletal: She exhibits no edema.  Neurological: She is alert and oriented to person, place, and time. She exhibits normal muscle tone.  Skin: Skin is warm and dry.  Psychiatric: She has a normal mood and affect. Her behavior is normal.          Assessment & Plan:

## 2012-10-16 NOTE — Assessment & Plan Note (Signed)
Patient's blood pressure is 130/90, pulse 100 sitting. Blood pressure 140/80, pulse 98 with standing.  It is unclear whether patient's symptoms may be secondary to autonomic dysfunction. Also consider POTS.  Patient advised to discuss this with neurologist at [facility].

## 2012-10-16 NOTE — Patient Instructions (Signed)
Discuss the possibility of autonomic dysfunction or POTS with your specialist

## 2012-10-17 ENCOUNTER — Ambulatory Visit: Payer: BC Managed Care – PPO | Admitting: Physical Therapy

## 2012-10-20 ENCOUNTER — Other Ambulatory Visit: Payer: Self-pay | Admitting: *Deleted

## 2012-10-23 ENCOUNTER — Telehealth: Payer: Self-pay | Admitting: *Deleted

## 2012-10-23 NOTE — Telephone Encounter (Signed)
Left message x4 and pt has not called back

## 2012-10-23 NOTE — Telephone Encounter (Signed)
Message copied by Jacqualyn Posey on Mon Oct 23, 2012 11:07 AM ------      Message from: Meda Coffee      Created: Mon Oct 16, 2012  6:06 PM       Please have pt return tomorrow for blood work:            CPK, Aldolase, sed rate, CRP, ANA, RF, copper level, ceruloplasmin, lyme titer, RPR, B12 level - myalgia, muscle weakness ------

## 2012-11-07 ENCOUNTER — Telehealth: Payer: Self-pay | Admitting: Internal Medicine

## 2012-11-07 NOTE — Telephone Encounter (Signed)
Pt called and stated that she would like to receive a letter releasing her back to work. She is no longer experiencing the symptoms that you previously where, that caused her to apply disability. She states that she is not going to pursue the disability. Please assist.

## 2012-11-08 NOTE — Telephone Encounter (Signed)
Pt needs to be cleared by her neurologist.  I can not just write letter clearing her back to work.

## 2012-11-08 NOTE — Telephone Encounter (Signed)
Pt aware.

## 2012-11-14 ENCOUNTER — Ambulatory Visit: Payer: Self-pay | Admitting: Family Medicine

## 2012-11-14 DIAGNOSIS — Z0289 Encounter for other administrative examinations: Secondary | ICD-10-CM

## 2012-11-16 ENCOUNTER — Ambulatory Visit: Payer: Self-pay | Admitting: Internal Medicine

## 2013-02-20 ENCOUNTER — Ambulatory Visit: Payer: Self-pay | Admitting: Nurse Practitioner

## 2013-03-30 ENCOUNTER — Emergency Department: Payer: Self-pay | Admitting: Emergency Medicine

## 2013-03-31 LAB — COMPREHENSIVE METABOLIC PANEL
Albumin: 3.4 g/dL (ref 3.4–5.0)
Alkaline Phosphatase: 114 U/L
Anion Gap: 7 (ref 7–16)
BUN: 8 mg/dL (ref 7–18)
Bilirubin,Total: 0.3 mg/dL (ref 0.2–1.0)
Calcium, Total: 9.2 mg/dL (ref 8.5–10.1)
Chloride: 106 mmol/L (ref 98–107)
Co2: 25 mmol/L (ref 21–32)
Creatinine: 1.08 mg/dL (ref 0.60–1.30)
EGFR (African American): >60
EGFR (Non-African Amer.): >60
Glucose: 94 mg/dL (ref 65–99)
Osmolality: 274 (ref 275–301)
Potassium: 4.8 mmol/L (ref 3.5–5.1)
SGOT(AST): 26 U/L (ref 15–37)
SGPT (ALT): 26 U/L (ref 12–78)
Sodium: 138 mmol/L (ref 136–145)
Total Protein: 8.1 g/dL (ref 6.4–8.2)

## 2013-03-31 LAB — CBC
HCT: 40.7 % (ref 35.0–47.0)
HGB: 13.6 g/dL (ref 12.0–16.0)
MCH: 28.8 pg (ref 26.0–34.0)
MCHC: 33.4 g/dL (ref 32.0–36.0)
MCV: 86 fL (ref 80–100)
Platelet: 355 10*3/uL (ref 150–440)
RBC: 4.71 10*6/uL (ref 3.80–5.20)
RDW: 13.3 % (ref 11.5–14.5)
WBC: 8.1 10*3/uL (ref 3.6–11.0)

## 2013-03-31 LAB — URINALYSIS, COMPLETE
Bilirubin,UR: NEGATIVE
Blood: NEGATIVE
Glucose,UR: NEGATIVE mg/dL (ref 0–75)
Ketone: NEGATIVE
Nitrite: NEGATIVE
Ph: 5 (ref 4.5–8.0)
Protein: NEGATIVE
RBC,UR: 4 /HPF (ref 0–5)
Specific Gravity: 1.027 (ref 1.003–1.030)
Squamous Epithelial: 3
WBC UR: 55 /HPF (ref 0–5)

## 2013-03-31 LAB — TROPONIN I: Troponin-I: <0.02 ng/mL

## 2013-04-17 ENCOUNTER — Emergency Department: Payer: Self-pay | Admitting: Emergency Medicine

## 2013-04-17 LAB — CBC WITH DIFFERENTIAL/PLATELET
Basophil #: 0.1 10*3/uL (ref 0.0–0.1)
Basophil %: 0.9 %
Eosinophil #: 0 10*3/uL (ref 0.0–0.7)
Eosinophil %: 0.5 %
HCT: 37.5 % (ref 35.0–47.0)
HGB: 12.6 g/dL (ref 12.0–16.0)
Lymphocyte #: 3.2 10*3/uL (ref 1.0–3.6)
Lymphocyte %: 38.6 %
MCH: 29.7 pg (ref 26.0–34.0)
MCHC: 33.7 g/dL (ref 32.0–36.0)
MCV: 88 fL (ref 80–100)
Monocyte #: 0.8 x10 3/mm (ref 0.2–0.9)
Monocyte %: 10.3 %
Neutrophil #: 4.1 10*3/uL (ref 1.4–6.5)
Neutrophil %: 49.7 %
Platelet: 343 10*3/uL (ref 150–440)
RBC: 4.26 10*6/uL (ref 3.80–5.20)
RDW: 13.6 % (ref 11.5–14.5)
WBC: 8.2 10*3/uL (ref 3.6–11.0)

## 2013-04-17 LAB — URINALYSIS, COMPLETE
Bacteria: NONE SEEN
Bilirubin,UR: NEGATIVE
Blood: NEGATIVE
Glucose,UR: NEGATIVE mg/dL (ref 0–75)
Ketone: NEGATIVE
Leukocyte Esterase: NEGATIVE
Nitrite: NEGATIVE
Ph: 7 (ref 4.5–8.0)
Protein: NEGATIVE
RBC,UR: <1 /HPF (ref 0–5)
Specific Gravity: 1.014 (ref 1.003–1.030)
Squamous Epithelial: 1
WBC UR: <1 /HPF (ref 0–5)

## 2013-04-17 LAB — COMPREHENSIVE METABOLIC PANEL
Albumin: 3.2 g/dL — ABNORMAL LOW (ref 3.4–5.0)
Alkaline Phosphatase: 107 U/L
Anion Gap: 2 — ABNORMAL LOW (ref 7–16)
BUN: 6 mg/dL — ABNORMAL LOW (ref 7–18)
Bilirubin,Total: 0.4 mg/dL (ref 0.2–1.0)
Calcium, Total: 8.9 mg/dL (ref 8.5–10.1)
Chloride: 107 mmol/L (ref 98–107)
Co2: 30 mmol/L (ref 21–32)
Creatinine: 0.9 mg/dL (ref 0.60–1.30)
EGFR (African American): >60
EGFR (Non-African Amer.): >60
Glucose: 84 mg/dL (ref 65–99)
Osmolality: 274 (ref 275–301)
Potassium: 4.1 mmol/L (ref 3.5–5.1)
SGOT(AST): 15 U/L (ref 15–37)
SGPT (ALT): 15 U/L (ref 12–78)
Sodium: 139 mmol/L (ref 136–145)
Total Protein: 7.6 g/dL (ref 6.4–8.2)

## 2013-04-17 LAB — PREGNANCY, URINE: Pregnancy Test, Urine: NEGATIVE m[IU]/mL

## 2013-04-17 LAB — WET PREP, GENITAL

## 2013-04-18 LAB — GC/CHLAMYDIA PROBE AMP

## 2013-05-24 ENCOUNTER — Emergency Department: Payer: Self-pay | Admitting: Emergency Medicine

## 2013-05-24 LAB — CBC
HCT: 39.1 % (ref 35.0–47.0)
HGB: 13.2 g/dL (ref 12.0–16.0)
MCH: 29.2 pg (ref 26.0–34.0)
MCHC: 33.8 g/dL (ref 32.0–36.0)
MCV: 86 fL (ref 80–100)
Platelet: 344 10*3/uL (ref 150–440)
RBC: 4.53 10*6/uL (ref 3.80–5.20)
RDW: 13.2 % (ref 11.5–14.5)
WBC: 9.4 10*3/uL (ref 3.6–11.0)

## 2013-05-24 LAB — PROTIME-INR
INR: 1
Prothrombin Time: 12.8 secs (ref 11.5–14.7)

## 2013-05-24 LAB — COMPREHENSIVE METABOLIC PANEL
Albumin: 3.3 g/dL — ABNORMAL LOW (ref 3.4–5.0)
Alkaline Phosphatase: 116 U/L
Anion Gap: 5 — ABNORMAL LOW (ref 7–16)
BUN: 6 mg/dL — ABNORMAL LOW (ref 7–18)
Bilirubin,Total: 0.2 mg/dL (ref 0.2–1.0)
Calcium, Total: 9.1 mg/dL (ref 8.5–10.1)
Chloride: 106 mmol/L (ref 98–107)
Co2: 25 mmol/L (ref 21–32)
Creatinine: 0.96 mg/dL (ref 0.60–1.30)
EGFR (African American): >60
EGFR (Non-African Amer.): >60
Glucose: 107 mg/dL — ABNORMAL HIGH (ref 65–99)
Osmolality: 270 (ref 275–301)
Potassium: 3.8 mmol/L (ref 3.5–5.1)
SGOT(AST): 23 U/L (ref 15–37)
SGPT (ALT): 27 U/L (ref 12–78)
Sodium: 136 mmol/L (ref 136–145)
Total Protein: 8.2 g/dL (ref 6.4–8.2)

## 2013-05-24 LAB — LIPASE, BLOOD: Lipase: 168 U/L (ref 73–393)

## 2013-05-25 LAB — URINALYSIS, COMPLETE
Bacteria: NONE SEEN
Bilirubin,UR: NEGATIVE
Glucose,UR: NEGATIVE mg/dL (ref 0–75)
Ketone: NEGATIVE
Leukocyte Esterase: NEGATIVE
Nitrite: NEGATIVE
Ph: 6 (ref 4.5–8.0)
Protein: NEGATIVE
RBC,UR: <1 /HPF (ref 0–5)
Specific Gravity: 1.006 (ref 1.003–1.030)
Squamous Epithelial: 1
WBC UR: NONE SEEN /HPF (ref 0–5)

## 2013-07-09 ENCOUNTER — Emergency Department (HOSPITAL_COMMUNITY)
Admission: EM | Admit: 2013-07-09 | Discharge: 2013-07-10 | Disposition: A | Discharge status: Home / Self Care | Payer: Medicaid Other | Attending: Emergency Medicine | Admitting: Emergency Medicine

## 2013-07-09 ENCOUNTER — Encounter (HOSPITAL_COMMUNITY): Payer: Self-pay | Admitting: Emergency Medicine

## 2013-07-09 DIAGNOSIS — M5090 Cervical disc disorder, unspecified, unspecified cervical region: Secondary | ICD-10-CM | POA: Insufficient documentation

## 2013-07-09 DIAGNOSIS — R110 Nausea: Secondary | ICD-10-CM | POA: Insufficient documentation

## 2013-07-09 DIAGNOSIS — R5381 Other malaise: Secondary | ICD-10-CM | POA: Insufficient documentation

## 2013-07-09 DIAGNOSIS — Z880 Allergy status to penicillin: Secondary | ICD-10-CM | POA: Insufficient documentation

## 2013-07-09 DIAGNOSIS — Z87448 Personal history of other diseases of urinary system: Secondary | ICD-10-CM | POA: Insufficient documentation

## 2013-07-09 DIAGNOSIS — Z9889 Other specified postprocedural states: Secondary | ICD-10-CM | POA: Insufficient documentation

## 2013-07-09 DIAGNOSIS — IMO0001 Reserved for inherently not codable concepts without codable children: Secondary | ICD-10-CM | POA: Insufficient documentation

## 2013-07-09 DIAGNOSIS — Z3202 Encounter for pregnancy test, result negative: Secondary | ICD-10-CM | POA: Insufficient documentation

## 2013-07-09 DIAGNOSIS — Z862 Personal history of diseases of the blood and blood-forming organs and certain disorders involving the immune mechanism: Secondary | ICD-10-CM | POA: Insufficient documentation

## 2013-07-09 DIAGNOSIS — Z8639 Personal history of other endocrine, nutritional and metabolic disease: Secondary | ICD-10-CM | POA: Insufficient documentation

## 2013-07-09 DIAGNOSIS — R197 Diarrhea, unspecified: Secondary | ICD-10-CM | POA: Insufficient documentation

## 2013-07-09 DIAGNOSIS — R5383 Other fatigue: Secondary | ICD-10-CM

## 2013-07-09 DIAGNOSIS — R1013 Epigastric pain: Secondary | ICD-10-CM | POA: Insufficient documentation

## 2013-07-09 DIAGNOSIS — R079 Chest pain, unspecified: Secondary | ICD-10-CM | POA: Insufficient documentation

## 2013-07-09 DIAGNOSIS — Z872 Personal history of diseases of the skin and subcutaneous tissue: Secondary | ICD-10-CM | POA: Insufficient documentation

## 2013-07-09 DIAGNOSIS — K5900 Constipation, unspecified: Secondary | ICD-10-CM | POA: Insufficient documentation

## 2013-07-09 DIAGNOSIS — Z79899 Other long term (current) drug therapy: Secondary | ICD-10-CM | POA: Insufficient documentation

## 2013-07-09 DIAGNOSIS — R0602 Shortness of breath: Secondary | ICD-10-CM | POA: Insufficient documentation

## 2013-07-09 LAB — URINALYSIS, ROUTINE W REFLEX MICROSCOPIC
Bilirubin Urine: NEGATIVE
Glucose, UA: NEGATIVE mg/dL
Hgb urine dipstick: NEGATIVE
Ketones, ur: NEGATIVE mg/dL
Leukocytes, UA: NEGATIVE
Nitrite: NEGATIVE
Protein, ur: NEGATIVE mg/dL
Specific Gravity, Urine: 1.024 (ref 1.005–1.030)
Urobilinogen, UA: 0.2 mg/dL (ref 0.0–1.0)
pH: 5.5 (ref 5.0–8.0)

## 2013-07-09 LAB — PREGNANCY, URINE: Preg Test, Ur: NEGATIVE

## 2013-07-09 NOTE — ED Notes (Signed)
Informed by triage that no blood could be drawn on pt - triage and phlebotomy tried

## 2013-07-09 NOTE — ED Notes (Signed)
Pt states mid upper abdominal pain for a while now and was seen about one month ago and told to see GI MD.  Pt did not follow up with gastroenterologist.  Pt reports chest pain, sob, and nausea.  Reports diarrhea related to IBS.  PT states pain gets worse with eating or drinking

## 2013-07-09 NOTE — ED Notes (Signed)
Phlebotomy unable to obtain labs 

## 2013-07-09 NOTE — ED Notes (Signed)
Phlebotomy in room. 

## 2013-07-10 ENCOUNTER — Emergency Department (HOSPITAL_COMMUNITY): Payer: Medicaid Other

## 2013-07-10 LAB — CBC WITH DIFFERENTIAL/PLATELET
Basophils Absolute: 0 10*3/uL (ref 0.0–0.1)
Basophils Relative: 0 % (ref 0–1)
Eosinophils Absolute: 0 10*3/uL (ref 0.0–0.7)
Eosinophils Relative: 0 % (ref 0–5)
HCT: 41 % (ref 36.0–46.0)
Hemoglobin: 14 g/dL (ref 12.0–15.0)
Lymphocytes Relative: 34 % (ref 12–46)
Lymphs Abs: 2.7 10*3/uL (ref 0.7–4.0)
MCH: 29.8 pg (ref 26.0–34.0)
MCHC: 34.1 g/dL (ref 30.0–36.0)
MCV: 87.2 fL (ref 78.0–100.0)
Monocytes Absolute: 0.6 10*3/uL (ref 0.1–1.0)
Monocytes Relative: 8 % (ref 3–12)
Neutro Abs: 4.6 10*3/uL (ref 1.7–7.7)
Neutrophils Relative %: 58 % (ref 43–77)
Platelets: 320 10*3/uL (ref 150–400)
RBC: 4.7 MIL/uL (ref 3.87–5.11)
RDW: 12.7 % (ref 11.5–15.5)
WBC: 7.9 10*3/uL (ref 4.0–10.5)

## 2013-07-10 LAB — COMPREHENSIVE METABOLIC PANEL WITH GFR
ALT: 10 U/L (ref 0–35)
AST: 12 U/L (ref 0–37)
Albumin: 3.5 g/dL (ref 3.5–5.2)
Alkaline Phosphatase: 119 U/L — ABNORMAL HIGH (ref 39–117)
BUN: 8 mg/dL (ref 6–23)
CO2: 24 meq/L (ref 19–32)
Calcium: 9.5 mg/dL (ref 8.4–10.5)
Chloride: 101 meq/L (ref 96–112)
Creatinine, Ser: 0.89 mg/dL (ref 0.50–1.10)
GFR calc Af Amer: 89 mL/min — ABNORMAL LOW (ref >90)
GFR calc non Af Amer: 77 mL/min — ABNORMAL LOW (ref >90)
Glucose, Bld: 87 mg/dL (ref 70–99)
Potassium: 3.8 meq/L (ref 3.7–5.3)
Sodium: 140 meq/L (ref 137–147)
Total Bilirubin: 0.5 mg/dL (ref 0.3–1.2)
Total Protein: 8 g/dL (ref 6.0–8.3)

## 2013-07-10 LAB — LIPASE, BLOOD: Lipase: 23 U/L (ref 11–59)

## 2013-07-10 LAB — I-STAT TROPONIN, ED: Troponin i, poc: 0 ng/mL (ref 0.00–0.08)

## 2013-07-10 MED ORDER — GI COCKTAIL ~~LOC~~
30.0000 mL | Freq: Once | ORAL | Status: AC
  Administered 2013-07-10: 30 mL via ORAL
  Filled 2013-07-10: qty 30

## 2013-07-10 MED ORDER — FENTANYL CITRATE 0.05 MG/ML IJ SOLN
100.0000 ug | Freq: Once | INTRAMUSCULAR | Status: AC
  Administered 2013-07-10: 100 ug via INTRAMUSCULAR
  Filled 2013-07-10: qty 2

## 2013-07-10 MED ORDER — ESOMEPRAZOLE MAGNESIUM 40 MG PO CPDR: 40.0000 mg | DELAYED_RELEASE_CAPSULE | Freq: Two times a day (BID) | ORAL | Status: DC

## 2013-07-10 MED ORDER — ONDANSETRON HCL 4 MG PO TABS: 4.0000 mg | ORAL_TABLET | Freq: Four times a day (QID) | ORAL | Status: DC

## 2013-07-10 MED ORDER — PANTOPRAZOLE SODIUM 40 MG PO TBEC
40.0000 mg | DELAYED_RELEASE_TABLET | Freq: Once | ORAL | Status: AC
  Administered 2013-07-10: 40 mg via ORAL
  Filled 2013-07-10: qty 1

## 2013-07-10 NOTE — ED Notes (Signed)
Patient transported to Ultrasound 

## 2013-07-10 NOTE — ED Provider Notes (Signed)
CSN: [id number]     Arrival date & time 07/09/13  1833 History   First MD Initiated Contact with Patient 07/09/13 2337     Chief Complaint  Patient presents with  . Abdominal Pain  . Nausea  . Shortness of Breath  . Chest Pain     (Consider location/radiation/quality/duration/timing/severity/associated sxs/prior Treatment) Patient is a 48 y.o. female presenting with abdominal pain. The history is provided by the patient. No language interpreter was used.  Abdominal Pain Pain location:  Epigastric Pain quality: sharp   Pain radiates to:  RUQ, LUQ, back and chest Pain severity:  Moderate Duration:  4 weeks Timing:  Intermittent Progression:  Waxing and waning Chronicity:  New Context: not alcohol use, not awakening from sleep, not diet changes, not recent sexual activity, not sick contacts, not suspicious food intake and not trauma   Relieved by: sitting up straight. Worsened by:  Palpation and eating (laying down, bending at waist) Ineffective treatments:  None tried Associated symptoms: chest pain, constipation, diarrhea, fatigue, nausea and shortness of breath   Associated symptoms: no anorexia, no chills, no cough, no dysuria, no fever, no sore throat and no vomiting   Chest pain:    Quality:  Sharp   Severity:  Mild   Onset quality:  Unable to specify   Duration:  1 week   Timing:  Intermittent   Progression:  Waxing and waning   Chronicity:  Recurrent Diarrhea:    Quality:  Semi-solid   Diarrhea duration: Chronic, IBS, intermittent. Fatigue:    Severity:  Moderate   Duration:  4 weeks   Timing:  Constant   Progression:  Waxing and waning Shortness of breath:    Severity:  Mild   Onset quality:  Unable to specify   Duration:  4 weeks   Progression:  Waxing and waning (with abdominal pain onset) Risk factors: obesity   Risk factors: no alcohol abuse, no aspirin use, not elderly, has not had multiple surgeries, no NSAID use, not pregnant and no recent  hospitalization   Risk factors comment:  Hx of gastric ulcer   Past Medical History  Diagnosis Date  . Acid reflux   . Seasonal allergies   . Cellulitis   . GOITER   . URTICARIA   . FATIGUE   . Palpitations   . Dysuria   . FLANK PAIN, RIGHT   . HEADACHES, HX OF   . Fibromyalgia   . Bleeding gastric ulcer   . Cervical back pain with evidence of disc disease    Past Surgical History  Procedure Laterality Date  . Cesarean section  1988 & 2008    x 2  . Wisdom tooth extraction    . Cartilage surgery      RT wrist  . Cardiac catheterization      05/17/12 / waiting on results   Family History  Problem Relation Age of Onset  . Hypertension Mother   . Huntington's disease Mother   . Diabetes Father   . Hypertension Father   . Heart disease Father   . Scoliosis Sister   . Huntington's disease Sister   . Diabetes Brother   . Hypertension Brother   . Scoliosis Brother   . Skin cancer Maternal Grandmother    History  Substance Use Topics  . Smoking status: Never Smoker   . Smokeless tobacco: Never Used  . Alcohol Use: No     Comment: occassional   OB History   Grav Para Term Preterm Abortions  TAB SAB Ect Mult Living   4 2 2  2 1 1   2      Review of Systems  Constitutional: Positive for fatigue. Negative for fever, chills, diaphoresis, activity change and appetite change.  HENT: Negative for congestion, facial swelling, rhinorrhea and sore throat.   Eyes: Negative for photophobia and discharge.  Respiratory: Positive for shortness of breath. Negative for cough and chest tightness.   Cardiovascular: Positive for chest pain. Negative for palpitations and leg swelling.  Gastrointestinal: Positive for nausea, abdominal pain, diarrhea and constipation. Negative for vomiting and anorexia.  Endocrine: Negative for polydipsia and polyuria.  Genitourinary: Negative for dysuria, frequency, difficulty urinating and pelvic pain.  Musculoskeletal: Negative for arthralgias, back  pain, neck pain and neck stiffness.  Skin: Negative for color change and wound.  Allergic/Immunologic: Negative for immunocompromised state.  Neurological: Negative for facial asymmetry, weakness, numbness and headaches.  Hematological: Does not bruise/bleed easily.  Psychiatric/Behavioral: Negative for confusion and agitation.      Allergies  Ibuprofen; Morphine and related; Penicillins; Pseudoephedrine; and Adhesive  Home Medications   Current Outpatient Rx  Name  Route  Sig  Dispense  Refill  . acetaminophen (TYLENOL) 500 MG tablet   Oral   Take 500 mg by mouth 2 (two) times daily as needed for pain.         . cetirizine (ZYRTEC) 10 MG tablet   Oral   Take 10 mg by mouth daily as needed. For allergies         . HYDROcodone-acetaminophen (NORCO) 10-325 MG per tablet   Oral   Take 1 tablet by mouth every 6 (six) hours as needed for moderate pain.         . meclizine (ANTIVERT) 25 MG tablet   Oral   Take 1 tablet (25 mg total) by mouth every 4 (four) hours as needed for dizziness.   60 tablet   0   . traMADol (ULTRAM) 50 MG tablet   Oral   Take 50 mg by mouth every 6 (six) hours as needed for moderate pain.          BP 114/70  Pulse 88  Temp(Src) 97.7 F (36.5 C) (Oral)  Resp 19  Wt 204 lb (92.534 kg)  SpO2 97%  LMP 07/01/2013 Physical Exam  Constitutional: She is oriented to person, place, and time. She appears well-developed and well-nourished. No distress.  HENT:  Head: Normocephalic and atraumatic.  Mouth/Throat: No oropharyngeal exudate.  Eyes: Pupils are equal, round, and reactive to light.  Neck: Normal range of motion. Neck supple.  Cardiovascular: Normal rate, regular rhythm and normal heart sounds.  Exam reveals no gallop and no friction rub.   No murmur heard. Pulmonary/Chest: Effort normal and breath sounds normal. No respiratory distress. She has no wheezes. She has no rales.  Abdominal: Soft. Bowel sounds are normal. She exhibits no  distension and no mass. There is tenderness in the right upper quadrant, epigastric area and left upper quadrant. There is no rigidity, no rebound and no guarding.  Musculoskeletal: Normal range of motion. She exhibits no edema and no tenderness.  Neurological: She is alert and oriented to person, place, and time.  Skin: Skin is warm and dry.  Psychiatric: She has a normal mood and affect.    ED Course  Procedures (including critical care time) Labs Review Labs Reviewed  COMPREHENSIVE METABOLIC PANEL - Abnormal; Notable for the following:    Alkaline Phosphatase 119 (*)    GFR calc  non Af Amer 77 (*)    GFR calc Af Amer 89 (*)    All other components within normal limits  PREGNANCY, URINE  URINALYSIS, ROUTINE W REFLEX MICROSCOPIC  CBC WITH DIFFERENTIAL  LIPASE, BLOOD  I-STAT TROPOININ, ED   Imaging Review [place] Abdomen Complete  07/10/2013   CLINICAL DATA:  Epigastric pain with radiation to the bilateral upper quadrants  EXAM: ULTRASOUND ABDOMEN COMPLETE  COMPARISON:  03/31/2013  FINDINGS: Gallbladder:  No gallstones or wall thickening visualized. No sonographic Murphy sign noted.  Common bile duct:  Diameter: 8 mm.  No visible filling defect  Liver:  There is a hyperechoic lesion within the inferior left hepatic lobe measuring up to 12 mm. This finding is stable dating back to infused abdominal CT 08/23/2010, most consistent with hemangioma.  IVC:  No abnormality visualized.  Pancreas:  Visualized portion unremarkable.  Spleen:  Size and appearance within normal limits.  Right Kidney:  Length: 9 cm. Echogenicity within normal limits. No mass or hydronephrosis visualized.  Left Kidney:  Length: 10 cm. Echogenicity within normal limits. No mass or hydronephrosis visualized.  Abdominal aorta:  No aneurysm visualized.  Other findings:  None.  IMPRESSION: 1. No findings to explain abdominal pain. 2. Incidental 12 mm hemangioma in the left liver.   Electronically Signed   By: Tiburcio PeaJonathan  Watts M.D.    On: 07/10/2013 01:45     EKG Interpretation   Date/Time:  Monday July 09 2013 18:53:09 EST Ventricular Rate:  89 PR Interval:  126 QRS Duration: 80 QT Interval:  350 QTC Calculation: 425 R Axis:   65 Text Interpretation:  Normal sinus rhythm Diffuse T wave inverios No  significant change was found Confirmed by Nyah Shepherd  MD, Tyke Outman ([id number]) on  07/10/2013 12:14:18 AM      MDM   Final diagnoses:  Epigastric pain    Pt is a 48 y.o. female with Pmhx as above who presents with about 1 month of intermittent epigastric pain w/ radiation to BL ribs and occasionally to back, worsen with PO intake. For about 1 week she has also had intermittent CP lasting seconds w/o radiation, assoc or alleviating symptoms. EKG w/o acute changes, trop not elevated.  Doubt ACS, hx of nml heart cath within past 5 years. CBC, CMP, lipase, UA unremarkable. Non-surgical abdominal exam. US w/o explanatory findings (including nml GB). Suspect gastritis/GERD.  Will place on PO nexium BID which she has done well with in the past. Will also given zofran for n/v. Will ask her to f/u closely w/ PCP. Return precautions given for new or worsening symptoms including worsening pain, fever, inability to tolerate PO.          Shanna CiscoMegan E Harbour Nordmeyer, MD 07/10/13 [phone]

## 2013-07-10 NOTE — ED Notes (Signed)
Pt back from US

## 2013-07-10 NOTE — ED Notes (Signed)
Dr Docherty in room 

## 2013-07-10 NOTE — Discharge Instructions (Signed)
Gastroesophageal Reflux Disease, Adult  Gastroesophageal reflux disease (GERD) happens when acid from your stomach flows up into the esophagus. When acid comes in contact with the esophagus, the acid causes soreness (inflammation) in the esophagus. Over time, GERD may create small holes (ulcers) in the lining of the esophagus.  CAUSES   · Increased body weight. This puts pressure on the stomach, making acid rise from the stomach into the esophagus.  · Smoking. This increases acid production in the stomach.  · Drinking alcohol. This causes decreased pressure in the lower esophageal sphincter (valve or ring of muscle between the esophagus and stomach), allowing acid from the stomach into the esophagus.  · Late evening meals and a full stomach. This increases pressure and acid production in the stomach.  · A malformed lower esophageal sphincter.  Sometimes, no cause is found.  SYMPTOMS   · Burning pain in the lower part of the mid-chest behind the breastbone and in the mid-stomach area. This may occur twice a week or more often.  · Trouble swallowing.  · Sore throat.  · Dry cough.  · Asthma-like symptoms including chest tightness, shortness of breath, or wheezing.  DIAGNOSIS   Your caregiver may be able to diagnose GERD based on your symptoms. In some cases, X-rays and other tests may be done to check for complications or to check the condition of your stomach and esophagus.  TREATMENT   Your caregiver may recommend over-the-counter or prescription medicines to help decrease acid production. Ask your caregiver before starting or adding any new medicines.   HOME CARE INSTRUCTIONS   · Change the factors that you can control. Ask your caregiver for guidance concerning weight loss, quitting smoking, and alcohol consumption.  · Avoid foods and drinks that make your symptoms worse, such as:  · Caffeine or alcoholic drinks.  · Chocolate.  · Peppermint or mint flavorings.  · Garlic and onions.  · Spicy foods.  · Citrus fruits,  such as oranges, lemons, or limes.  · Tomato-based foods such as sauce, chili, salsa, and pizza.  · Fried and fatty foods.  · Avoid lying down for the 3 hours prior to your bedtime or prior to taking a nap.  · Eat small, frequent meals instead of large meals.  · Wear loose-fitting clothing. Do not wear anything tight around your waist that causes pressure on your stomach.  · Raise the head of your bed 6 to 8 inches with wood blocks to help you sleep. Extra pillows will not help.  · Only take over-the-counter or prescription medicines for pain, discomfort, or fever as directed by your caregiver.  · Do not take aspirin, ibuprofen, or other nonsteroidal anti-inflammatory drugs (NSAIDs).  SEEK IMMEDIATE MEDICAL CARE IF:   · You have pain in your arms, neck, jaw, teeth, or back.  · Your pain increases or changes in intensity or duration.  · You develop nausea, vomiting, or sweating (diaphoresis).  · You develop shortness of breath, or you faint.  · Your vomit is green, yellow, black, or looks like coffee grounds or blood.  · Your stool is red, bloody, or black.  These symptoms could be signs of other problems, such as heart disease, gastric bleeding, or esophageal bleeding.  MAKE SURE YOU:   · Understand these instructions.  · Will watch your condition.  · Will get help right away if you are not doing well or get worse.  Document Released: 02/03/2005 Document Revised: 07/19/2011 Document Reviewed: 11/13/2010  [employer]® Patient   Information ©2014 [employer].

## 2013-07-12 ENCOUNTER — Emergency Department (HOSPITAL_COMMUNITY): Payer: Medicaid Other

## 2013-07-12 ENCOUNTER — Encounter (HOSPITAL_COMMUNITY): Payer: Self-pay | Admitting: Emergency Medicine

## 2013-07-12 ENCOUNTER — Emergency Department (HOSPITAL_COMMUNITY)
Admission: EM | Admit: 2013-07-12 | Discharge: 2013-07-12 | Disposition: A | Discharge status: Home / Self Care | Payer: Medicaid Other | Attending: Emergency Medicine | Admitting: Emergency Medicine

## 2013-07-12 DIAGNOSIS — Z79899 Other long term (current) drug therapy: Secondary | ICD-10-CM | POA: Insufficient documentation

## 2013-07-12 DIAGNOSIS — M5090 Cervical disc disorder, unspecified, unspecified cervical region: Secondary | ICD-10-CM | POA: Insufficient documentation

## 2013-07-12 DIAGNOSIS — R1013 Epigastric pain: Secondary | ICD-10-CM | POA: Insufficient documentation

## 2013-07-12 DIAGNOSIS — K589 Irritable bowel syndrome without diarrhea: Secondary | ICD-10-CM | POA: Insufficient documentation

## 2013-07-12 DIAGNOSIS — R197 Diarrhea, unspecified: Secondary | ICD-10-CM | POA: Insufficient documentation

## 2013-07-12 DIAGNOSIS — Z880 Allergy status to penicillin: Secondary | ICD-10-CM | POA: Insufficient documentation

## 2013-07-12 DIAGNOSIS — Z8719 Personal history of other diseases of the digestive system: Secondary | ICD-10-CM | POA: Insufficient documentation

## 2013-07-12 DIAGNOSIS — M546 Pain in thoracic spine: Secondary | ICD-10-CM | POA: Insufficient documentation

## 2013-07-12 DIAGNOSIS — K219 Gastro-esophageal reflux disease without esophagitis: Secondary | ICD-10-CM | POA: Insufficient documentation

## 2013-07-12 DIAGNOSIS — R110 Nausea: Secondary | ICD-10-CM | POA: Insufficient documentation

## 2013-07-12 DIAGNOSIS — R630 Anorexia: Secondary | ICD-10-CM | POA: Insufficient documentation

## 2013-07-12 LAB — COMPREHENSIVE METABOLIC PANEL
ALT: 9 U/L (ref 0–35)
AST: 10 U/L (ref 0–37)
Albumin: 3.4 g/dL — ABNORMAL LOW (ref 3.5–5.2)
Alkaline Phosphatase: 104 U/L (ref 39–117)
BUN: 10 mg/dL (ref 6–23)
CO2: 23 mEq/L (ref 19–32)
Calcium: 9.1 mg/dL (ref 8.4–10.5)
Chloride: 104 mEq/L (ref 96–112)
Creatinine, Ser: 0.96 mg/dL (ref 0.50–1.10)
GFR calc Af Amer: 81 mL/min — ABNORMAL LOW (ref >90)
GFR calc non Af Amer: 70 mL/min — ABNORMAL LOW (ref >90)
Glucose, Bld: 87 mg/dL (ref 70–99)
Potassium: 3.7 mEq/L (ref 3.7–5.3)
Sodium: 139 mEq/L (ref 137–147)
Total Bilirubin: 0.6 mg/dL (ref 0.3–1.2)
Total Protein: 7.2 g/dL (ref 6.0–8.3)

## 2013-07-12 LAB — LIPASE, BLOOD: Lipase: 23 U/L (ref 11–59)

## 2013-07-12 LAB — CBC WITH DIFFERENTIAL/PLATELET
Basophils Absolute: 0 10*3/uL (ref 0.0–0.1)
Basophils Relative: 0 % (ref 0–1)
Eosinophils Absolute: 0.1 10*3/uL (ref 0.0–0.7)
Eosinophils Relative: 1 % (ref 0–5)
HCT: 38 % (ref 36.0–46.0)
Hemoglobin: 12.9 g/dL (ref 12.0–15.0)
Lymphocytes Relative: 51 % — ABNORMAL HIGH (ref 12–46)
Lymphs Abs: 3.1 10*3/uL (ref 0.7–4.0)
MCH: 29.7 pg (ref 26.0–34.0)
MCHC: 33.9 g/dL (ref 30.0–36.0)
MCV: 87.4 fL (ref 78.0–100.0)
Monocytes Absolute: 0.7 10*3/uL (ref 0.1–1.0)
Monocytes Relative: 12 % (ref 3–12)
Neutro Abs: 2.1 10*3/uL (ref 1.7–7.7)
Neutrophils Relative %: 35 % — ABNORMAL LOW (ref 43–77)
Platelets: 343 10*3/uL (ref 150–400)
RBC: 4.35 MIL/uL (ref 3.87–5.11)
RDW: 12.6 % (ref 11.5–15.5)
WBC: 6 10*3/uL (ref 4.0–10.5)

## 2013-07-12 LAB — URINALYSIS, ROUTINE W REFLEX MICROSCOPIC
Bilirubin Urine: NEGATIVE
Glucose, UA: NEGATIVE mg/dL
Hgb urine dipstick: NEGATIVE
Ketones, ur: NEGATIVE mg/dL
Leukocytes, UA: NEGATIVE
Nitrite: NEGATIVE
Protein, ur: NEGATIVE mg/dL
Specific Gravity, Urine: 1.01 (ref 1.005–1.030)
Urobilinogen, UA: 0.2 mg/dL (ref 0.0–1.0)
pH: 5 (ref 5.0–8.0)

## 2013-07-12 LAB — I-STAT CG4 LACTIC ACID, ED: Lactic Acid, Venous: 1.07 mmol/L (ref 0.5–2.2)

## 2013-07-12 LAB — POC URINE PREG, ED: Preg Test, Ur: NEGATIVE

## 2013-07-12 LAB — I-STAT TROPONIN, ED: Troponin i, poc: 0.01 ng/mL (ref 0.00–0.08)

## 2013-07-12 MED ORDER — PANTOPRAZOLE SODIUM 40 MG PO TBEC
40.0000 mg | DELAYED_RELEASE_TABLET | Freq: Every day | ORAL | Status: DC
  Administered 2013-07-12: 40 mg via ORAL
  Filled 2013-07-12: qty 1

## 2013-07-12 MED ORDER — SUCRALFATE 1 G PO TABS
1.0000 g | ORAL_TABLET | Freq: Three times a day (TID) | ORAL | Status: DC
  Filled 2013-07-12 (×4): qty 1

## 2013-07-12 MED ORDER — HYDROCODONE-ACETAMINOPHEN 5-325 MG PO TABS
1.0000 | ORAL_TABLET | Freq: Once | ORAL | Status: AC
  Administered 2013-07-12: 1 via ORAL
  Filled 2013-07-12: qty 1

## 2013-07-12 MED ORDER — GI COCKTAIL ~~LOC~~
30.0000 mL | Freq: Once | ORAL | Status: AC
  Administered 2013-07-12: 30 mL via ORAL
  Filled 2013-07-12: qty 30

## 2013-07-12 MED ORDER — SUCRALFATE 1 G PO TABS: 1.0000 g | ORAL_TABLET | Freq: Three times a day (TID) | ORAL | Status: DC

## 2013-07-12 NOTE — Discharge Instructions (Signed)
Please follow up with a GI specialist for further management of your abdominal pain.  Take Nexium and Carafate as prescribed.  Return if you have any other concerns.   Abdominal Pain, Women Abdominal (stomach, pelvic, or belly) pain can be caused by many things. It is important to tell your doctor:  The location of the pain.  Does it come and go or is it present all the time?  Are there things that start the pain (eating certain foods, exercise)?  Are there other symptoms associated with the pain (fever, nausea, vomiting, diarrhea)? All of this is helpful to know when trying to find the cause of the pain. CAUSES   Stomach: virus or bacteria infection, or ulcer.  Intestine: appendicitis (inflamed appendix), regional ileitis (Crohn's disease), ulcerative colitis (inflamed colon), irritable bowel syndrome, diverticulitis (inflamed diverticulum of the colon), or cancer of the stomach or intestine.  Gallbladder disease or stones in the gallbladder.  Kidney disease, kidney stones, or infection.  Pancreas infection or cancer.  Fibromyalgia (pain disorder).  Diseases of the female organs:  Uterus: fibroid (non-cancerous) tumors or infection.  Fallopian tubes: infection or tubal pregnancy.  Ovary: cysts or tumors.  Pelvic adhesions (scar tissue).  Endometriosis (uterus lining tissue growing in the pelvis and on the pelvic organs).  Pelvic congestion syndrome (female organs filling up with blood just before the menstrual period).  Pain with the menstrual period.  Pain with ovulation (producing an egg).  Pain with an IUD (intrauterine device, birth control) in the uterus.  Cancer of the female organs.  Functional pain (pain not caused by a disease, may improve without treatment).  Psychological pain.  Depression. DIAGNOSIS  Your doctor will decide the seriousness of your pain by doing an examination.  Blood tests.  X-rays.  Ultrasound.  CT scan (computed  tomography, special type of X-ray).  MRI (magnetic resonance imaging).  Cultures, for infection.  Barium enema (dye inserted in the large intestine, to better view it with X-rays).  Colonoscopy (looking in intestine with a lighted tube).  Laparoscopy (minor surgery, looking in abdomen with a lighted tube).  Major abdominal exploratory surgery (looking in abdomen with a large incision). TREATMENT  The treatment will depend on the cause of the pain.   Many cases can be observed and treated at home.  Over-the-counter medicines recommended by your caregiver.  Prescription medicine.  Antibiotics, for infection.  Birth control pills, for painful periods or for ovulation pain.  Hormone treatment, for endometriosis.  Nerve blocking injections.  Physical therapy.  Antidepressants.  Counseling with a psychologist or psychiatrist.  Minor or major surgery. HOME CARE INSTRUCTIONS   Do not take laxatives, unless directed by your caregiver.  Take over-the-counter pain medicine only if ordered by your caregiver. Do not take aspirin because it can cause an upset stomach or bleeding.  Try a clear liquid diet (broth or water) as ordered by your caregiver. Slowly move to a bland diet, as tolerated, if the pain is related to the stomach or intestine.  Have a thermometer and take your temperature several times a day, and record it.  Bed rest and sleep, if it helps the pain.  Avoid sexual intercourse, if it causes pain.  Avoid stressful situations.  Keep your follow-up appointments and tests, as your caregiver orders.  If the pain does not go away with medicine or surgery, you may try:  Acupuncture.  Relaxation exercises (yoga, meditation).  Group therapy.  Counseling. SEEK MEDICAL CARE IF:   You notice certain  foods cause stomach pain.  Your home care treatment is not helping your pain.  You need stronger pain medicine.  You want your IUD removed.  You feel faint  or lightheaded.  You develop nausea and vomiting.  You develop a rash.  You are having side effects or an allergy to your medicine. SEEK IMMEDIATE MEDICAL CARE IF:   Your pain does not go away or gets worse.  You have a fever.  Your pain is felt only in portions of the abdomen. The right side could possibly be appendicitis. The left lower portion of the abdomen could be colitis or diverticulitis.  You are passing blood in your stools (bright red or black tarry stools, with or without vomiting).  You have blood in your urine.  You develop chills, with or without a fever.  You pass out. MAKE SURE YOU:   Understand these instructions.  Will watch your condition.  Will get help right away if you are not doing well or get worse. Document Released: 02/21/2007 Document Revised: 07/19/2011 Document Reviewed: 03/13/2009 [facility] Patient Information 2014 Willow StreetExitCare, MarylandLLC.

## 2013-07-12 NOTE — ED Notes (Signed)
Bed: [id number] Expected date: 07/12/13 Expected time: 5:50 AM Means of arrival: Ambulance Comments: abd pain

## 2013-07-12 NOTE — ED Notes (Signed)
Brought in by EMS from home with c/o abdominal pain. Pt reports that pt has been having generalized abdominal pain for a month now.  Pt was seen here on 07/09/13 for same complaint--- was discharged home with a prescription for Nexium 40 mg daily.  Pt reports that she has not filled her prescription.  Pt also c/o nausea, no vomiting or diarrhea.  Pt reports that pain got "worse" this morning at around 0300.

## 2013-07-12 NOTE — ED Provider Notes (Signed)
Medical screening examination/treatment/procedure(s) were performed by non-physician practitioner and as supervising physician I was immediately available for consultation/collaboration.   EKG Interpretation   Date/Time:  Thursday July 12 2013 06:52:32 EST Ventricular Rate:  69 PR Interval:  144 QRS Duration: 92 QT Interval:  403 QTC Calculation: 432 R Axis:   41 Text Interpretation:  Sinus rhythm Nonspecific ST and T wave abnormality  Abnormal ECG Confirmed by Wylie Coon  MD, Juletta Berhe ([id number]) on 07/12/2013 7:26:45 AM       Sunnie NielsenBrian Avin Gibbons, MD 07/12/13 2253

## 2013-07-12 NOTE — ED Provider Notes (Signed)
CSN: [id number]     Arrival date & time 07/12/13  0608 History   First MD Initiated Contact with Patient 07/12/13 [phone]     Chief Complaint  Patient presents with  . Abdominal Pain     (Consider location/radiation/quality/duration/timing/severity/associated sxs/prior Treatment) HPI Comments: Patient here today complaining of abdominal pain that started four days prior that began in epigastric area but in past 24 hours has began radiating to right upper quadrant and to back at level of shoulder blades. She was seen Monday 07/09/2013 for same. Labs and exam were unremarkable at that visit and patient was discharged home. She states that pain has worsened since previous visit. She was given a GI cocktail and pain management medication at last visit that alleviated pain temporarily, and sitting in an erect position seems to alleviate pain, but otherwise nothing relieves pain. Lying down seems to make pain worse. Patient admits constant nausea and decreased appetite but denies vomiting. Diarrhea and constipation present, but probably attributed to previous diagnosis of irritable bowel syndrome. Patient also SOB when pain is more severe.   Patient does report that she has had similar pain ongoing for more than 6 months. She has been seen by PT P. and also had several urgent care and ER for this complaint. She was recommended to followup with a GI specialist however she has not done so. During last visit 3 days ago she was prescribed Nexium but has not filled the medication yet. She reports improvement with Nexium in the past. She otherwise denies any significant cardiac disease or family history of cardiac disease. She denies history of alcohol abuse, history of diabetes or any prior history of pancreatitis. She is not a smoker. She denies any recent trauma. Currently denies any lightheadedness or dizziness. She denies hematemesis, hemoptysis, hematochezia, or melena. Reports prior history of bleeding gastric  ulcer several years ago. Currently denies fever or chills.  Patient is a 48 y.o. female presenting with abdominal pain. The history is provided by the patient. No language interpreter was used.  Abdominal Pain Associated symptoms: constipation, diarrhea, nausea and shortness of breath   Associated symptoms: no vomiting       Past Medical History  Diagnosis Date  . Acid reflux   . Seasonal allergies   . Cellulitis   . GOITER   . URTICARIA   . FATIGUE   . Palpitations   . Dysuria   . FLANK PAIN, RIGHT   . HEADACHES, HX OF   . Fibromyalgia   . Bleeding gastric ulcer   . Cervical back pain with evidence of disc disease    Past Surgical History  Procedure Laterality Date  . Cesarean section  1988 & 2008    x 2  . Wisdom tooth extraction    . Cartilage surgery      RT wrist  . Cardiac catheterization      05/17/12 / waiting on results   Family History  Problem Relation Age of Onset  . Hypertension Mother   . Huntington's disease Mother   . Diabetes Father   . Hypertension Father   . Heart disease Father   . Scoliosis Sister   . Huntington's disease Sister   . Diabetes Brother   . Hypertension Brother   . Scoliosis Brother   . Skin cancer Maternal Grandmother    History  Substance Use Topics  . Smoking status: Never Smoker   . Smokeless tobacco: Never Used  . Alcohol Use: No  Comment: occassional   OB History   Grav Para Term Preterm Abortions TAB SAB Ect Mult Living   4 2 2  2 1 1   2      Review of Systems  Constitutional: Positive for appetite change.  Respiratory: Positive for shortness of breath.   Gastrointestinal: Positive for nausea, abdominal pain, diarrhea and constipation. Negative for vomiting.  All other systems reviewed and are negative.      Allergies  Ibuprofen; Morphine and related; Penicillins; Pseudoephedrine; and Adhesive  Home Medications   Current Outpatient Rx  Name  Route  Sig  Dispense  Refill  . acetaminophen  (TYLENOL) 500 MG tablet   Oral   Take 500 mg by mouth every 6 (six) hours as needed for moderate pain.          [name] ondansetron (ZOFRAN) 4 MG tablet   Oral   Take 4 mg by mouth every 6 (six) hours as needed for nausea or vomiting.         . traMADol (ULTRAM) 50 MG tablet   Oral   Take 50 mg by mouth every 6 (six) hours as needed for moderate pain.         [name] esomeprazole (NEXIUM) 40 MG capsule   Oral   Take 1 capsule (40 mg total) by mouth 2 (two) times daily before a meal.   60 capsule   0   . HYDROcodone-acetaminophen (NORCO) 10-325 MG per tablet   Oral   Take 1 tablet by mouth every 6 (six) hours as needed for moderate pain.          BP 115/74  Pulse 75  Temp(Src) 98.3 F (36.8 C) (Oral)  Resp 19  Ht 5\' 10"  (1.778 m)  Wt 189 lb (85.73 kg)  BMI 27.12 kg/m2  SpO2 96%  LMP 07/01/2013 Physical Exam  Constitutional: She appears well-developed and well-nourished. No distress.  Cardiovascular: Normal rate, regular rhythm and normal heart sounds.   Pulmonary/Chest: Effort normal and breath sounds normal.  Abdominal: Soft. She exhibits no distension. There is tenderness. There is no rebound and no guarding.  Tenderness noted in epigastric region as well as right upper quadrant with moderate palpation. Right lower quadrant slightly tender with deep palpation. Left upper and lower quadrant not tender to palpation.  Skin: Skin is warm and dry. She is not diaphoretic.  Psychiatric: She has a normal mood and affect. Her behavior is normal.    ED Course  Procedures (including critical care time)  7:10 AM Patient with recurrent history of epigastric pain suggestive of GERD/PUD. She is presenting acute on chronic epigastric abdominal pain. She does have reproducible epigastric pain on exam however no peritoneal sign. No active bleeding. I have low suspicion for ACS, aortic dissection, pneumonia, pneumothorax, esophageal varices, or shingle. Given that her pain does radiates to  the back, we'll obtain an acute abdominal series to rule out free air however my suspicion is low. GI cocktail given.  Work up initiated.  Last abd [place] 3 days ago without any acute finding.  Care discussed with Dr. Dierdre Highman  9:01 AM Workup today has been fairly unremarkable. Patient has an unchanged EKG, normal troponin, and normal chest x-ray. She pregnancy test negative, UA without signs of UTI, normal lactic acid, CBC, CMP, and lipase are normal. Given that she is afebrile, VSS, and appears comfortable, i do not suspect acute emergent.  Suspect PUD/H.pylori.  Will give patient a referral to a GI specialist for further management.  Labs Review Labs Reviewed  CBC WITH DIFFERENTIAL - Abnormal; Notable for the following:    Neutrophils Relative % 35 (*)    Lymphocytes Relative 51 (*)    All other components within normal limits  COMPREHENSIVE METABOLIC PANEL - Abnormal; Notable for the following:    Albumin 3.4 (*)    GFR calc non Af Amer 70 (*)    GFR calc Af Amer 81 (*)    All other components within normal limits  LIPASE, BLOOD  URINALYSIS, ROUTINE W REFLEX MICROSCOPIC  POC URINE PREG, ED  I-STAT CG4 LACTIC ACID, ED  I-STAT TROPOININ, ED   Imaging Review Dg Abd Acute W/chest  07/12/2013   CLINICAL DATA:  Rule out free air.  Upper abdominal pain  EXAM: ACUTE ABDOMEN SERIES (ABDOMEN 2 VIEW & CHEST 1 VIEW)  COMPARISON:  Abdominal CT 05/25/2013  FINDINGS: Small linear opacities at the peripheral bases, most consistent with atelectasis. No edema, consolidation, effusion, or pneumothorax. Normal heart size.  Nonobstructive bowel gas pattern. No abnormal intra-abdominal mass effect or calcification. Lucency under the left diaphragm is fat around the spleen. No free intraperitoneal air suspected.  IMPRESSION: 1. No evidence of bowel obstruction or perforation. 2. Mild basilar atelectasis.   Electronically Signed   By: Tiburcio Pea M.D.   On: 07/12/2013 07:26     EKG Interpretation   Date/Time:   Thursday July 12 2013 06:52:32 EST Ventricular Rate:  69 PR Interval:  144 QRS Duration: 92 QT Interval:  403 QTC Calculation: 432 R Axis:   41 Text Interpretation:  Sinus rhythm Nonspecific ST and T wave abnormality  Abnormal ECG Confirmed by OPITZ  MD, BRIAN ([id number]) on 07/12/2013 7:26:45 AM      MDM   Final diagnoses:  Epigastric abdominal pain    BP 115/74  Pulse 75  Temp(Src) 98.3 F (36.8 C) (Oral)  Resp 19  Ht 5\' 10"  (1.778 m)  Wt 189 lb (85.73 kg)  BMI 27.12 kg/m2  SpO2 96%  LMP 07/01/2013  I have reviewed nursing notes and vital signs. I personally reviewed the imaging tests through PACS system  I reviewed available ER/hospitalization records thought the EMR     Fayrene Helper, [place] 07/12/13 0915

## 2013-08-17 ENCOUNTER — Inpatient Hospital Stay (HOSPITAL_COMMUNITY): Payer: Medicaid Other

## 2013-08-17 ENCOUNTER — Inpatient Hospital Stay (HOSPITAL_COMMUNITY)
Admission: AD | Admit: 2013-08-17 | Discharge: 2013-08-17 | Disposition: A | Discharge status: Home / Self Care | Payer: Medicaid Other | Source: Ambulatory Visit | Attending: Family Medicine | Admitting: Family Medicine

## 2013-08-17 ENCOUNTER — Encounter (HOSPITAL_COMMUNITY): Payer: Self-pay | Admitting: *Deleted

## 2013-08-17 DIAGNOSIS — N949 Unspecified condition associated with female genital organs and menstrual cycle: Secondary | ICD-10-CM | POA: Insufficient documentation

## 2013-08-17 DIAGNOSIS — N83209 Unspecified ovarian cyst, unspecified side: Secondary | ICD-10-CM

## 2013-08-17 DIAGNOSIS — N831 Corpus luteum cyst of ovary, unspecified side: Secondary | ICD-10-CM | POA: Insufficient documentation

## 2013-08-17 DIAGNOSIS — K589 Irritable bowel syndrome without diarrhea: Secondary | ICD-10-CM | POA: Insufficient documentation

## 2013-08-17 DIAGNOSIS — IMO0001 Reserved for inherently not codable concepts without codable children: Secondary | ICD-10-CM | POA: Insufficient documentation

## 2013-08-17 DIAGNOSIS — K254 Chronic or unspecified gastric ulcer with hemorrhage: Secondary | ICD-10-CM | POA: Insufficient documentation

## 2013-08-17 DIAGNOSIS — R109 Unspecified abdominal pain: Secondary | ICD-10-CM | POA: Insufficient documentation

## 2013-08-17 DIAGNOSIS — E049 Nontoxic goiter, unspecified: Secondary | ICD-10-CM | POA: Insufficient documentation

## 2013-08-17 DIAGNOSIS — N83201 Unspecified ovarian cyst, right side: Secondary | ICD-10-CM

## 2013-08-17 DIAGNOSIS — R002 Palpitations: Secondary | ICD-10-CM | POA: Insufficient documentation

## 2013-08-17 DIAGNOSIS — N854 Malposition of uterus: Secondary | ICD-10-CM | POA: Insufficient documentation

## 2013-08-17 DIAGNOSIS — K219 Gastro-esophageal reflux disease without esophagitis: Secondary | ICD-10-CM | POA: Insufficient documentation

## 2013-08-17 LAB — URINALYSIS, ROUTINE W REFLEX MICROSCOPIC
Bilirubin Urine: NEGATIVE
Glucose, UA: NEGATIVE mg/dL
Ketones, ur: NEGATIVE mg/dL
Leukocytes, UA: NEGATIVE
Nitrite: NEGATIVE
Protein, ur: NEGATIVE mg/dL
Specific Gravity, Urine: 1.025 (ref 1.005–1.030)
Urobilinogen, UA: 0.2 mg/dL (ref 0.0–1.0)
pH: 6 (ref 5.0–8.0)

## 2013-08-17 LAB — URINE MICROSCOPIC-ADD ON

## 2013-08-17 LAB — WET PREP, GENITAL
Trich, Wet Prep: NONE SEEN
Yeast Wet Prep HPF POC: NONE SEEN

## 2013-08-17 MED ORDER — TRAMADOL HCL 50 MG PO TABS
100.0000 mg | ORAL_TABLET | Freq: Once | ORAL | Status: AC
  Administered 2013-08-17: 100 mg via ORAL
  Filled 2013-08-17: qty 2

## 2013-08-17 MED ORDER — HYDROCODONE-ACETAMINOPHEN 5-325 MG PO TABS: 1.0000 | ORAL_TABLET | Freq: Four times a day (QID) | ORAL | Status: DC | PRN

## 2013-08-17 MED ORDER — KETOROLAC TROMETHAMINE 60 MG/2ML IM SOLN
30.0000 mg | Freq: Once | INTRAMUSCULAR | Status: AC
  Administered 2013-08-17: 30 mg via INTRAMUSCULAR
  Filled 2013-08-17: qty 2

## 2013-08-17 MED ORDER — HYDROCODONE-ACETAMINOPHEN 5-325 MG PO TABS
1.0000 | ORAL_TABLET | Freq: Once | ORAL | Status: AC
  Administered 2013-08-17: 1 via ORAL
  Filled 2013-08-17: qty 1

## 2013-08-17 NOTE — MAU Provider Note (Signed)
Attestation of Attending Supervision of Advanced Practitioner (PA/CNM/NP): Evaluation and management procedures were performed by the Advanced Practitioner under my supervision and collaboration.  I have reviewed the Advanced Practitioner's note and chart, and I agree with the management and plan.  Reeve Mallo, DO Attending Physician Faculty Practice, [facility]  

## 2013-08-17 NOTE — Discharge Instructions (Signed)
Your ultrasound today shows that you have cyst that has ruptured and this is most likely the reason for your pain. Take the pain medication as directed. Do not take the narcotic if you are driving as it will make you sleepy.

## 2013-08-17 NOTE — MAU Note (Signed)
Patient states she has had abdominal/pelvic pain on and off for about 2 months. States she has had some spotting not related to her period and periods have been irregular.

## 2013-08-17 NOTE — MAU Provider Note (Addendum)
History     CSN: [id number]  Arrival date and time: 08/17/13 1326   First Provider Initiated Contact with Patient 08/17/13 1443      Chief Complaint  Patient presents with  . Abdominal Pain   HPI [name] is a 48 y.o. [phone] who presents to MAU today with complaint of sharps pelvic pain. She states that it has been on and off "for a while." The patient states that she had sex a few weeks ago and had pelvic pain x 2 days after that. She has regular periods but has noted spotting in between periods lately. She denies vaginal discharge or fever. She has mild nausea without vomiting or diarrhea. She does endorse frequent constipation. The patient had IBS and Fibromyalgia.   OB History   Grav Para Term Preterm Abortions TAB SAB Ect Mult Living   4 2 2  2 1 1   2       Past Medical History  Diagnosis Date  . Acid reflux   . Seasonal allergies   . Cellulitis   . GOITER   . URTICARIA   . FATIGUE   . Palpitations   . Dysuria   . FLANK PAIN, RIGHT   . HEADACHES, HX OF   . Fibromyalgia   . Bleeding gastric ulcer   . Cervical back pain with evidence of disc disease     Past Surgical History  Procedure Laterality Date  . Cesarean section  1988 & 2008    x 2  . Wisdom tooth extraction    . Cartilage surgery      RT wrist  . Cardiac catheterization      05/17/12 / waiting on results    Family History  Problem Relation Age of Onset  . Hypertension Mother   . Huntington's disease Mother   . Diabetes Father   . Hypertension Father   . Heart disease Father   . Scoliosis Sister   . Huntington's disease Sister   . Diabetes Brother   . Hypertension Brother   . Scoliosis Brother   . Skin cancer Maternal Grandmother     History  Substance Use Topics  . Smoking status: Never Smoker   . Smokeless tobacco: Never Used  . Alcohol Use: No     Comment: occassional    Allergies:  Allergies  Allergen Reactions  . Ibuprofen     "It intensifies my pain."  .  Morphine And Related Itching and Other (See Comments)    Reaction burns  . Penicillins Hives and Itching  . Pseudoephedrine Other (See Comments)    Speed up heart rate  . Adhesive [Tape]     Itching and burning at site    Prescriptions prior to admission  Medication Sig Dispense Refill  . acetaminophen (TYLENOL) 500 MG tablet Take 500 mg by mouth every 6 (six) hours as needed for moderate pain.       [name] ondansetron (ZOFRAN) 4 MG tablet Take 4 mg by mouth every 6 (six) hours as needed for nausea or vomiting.      . pantoprazole (PROTONIX) 40 MG tablet Take 40 mg by mouth daily.      . sucralfate (CARAFATE) 1 G tablet Take 1 tablet (1 g total) by mouth 4 (four) times daily -  with meals and at bedtime.  20 tablet  0  . traMADol (ULTRAM) 50 MG tablet Take 50 mg by mouth every 6 (six) hours as needed for moderate pain.  Review of Systems  Constitutional: Negative for fever and malaise/fatigue.  Gastrointestinal: Positive for nausea, abdominal pain and constipation. Negative for vomiting and diarrhea.  Genitourinary: Negative for dysuria, urgency and frequency.       Neg - vaginal discharge + vaginal bleeding   Physical Exam   Blood pressure 119/74, pulse 80, temperature 98.2 F (36.8 C), resp. rate 16, height 5\' 8"  (1.727 m), weight 91.173 kg (201 lb), last menstrual period 07/22/2013, SpO2 100.00%.  Physical Exam  Constitutional: She is oriented to person, place, and time. She appears well-developed and well-nourished. No distress.  HENT:  Head: Normocephalic and atraumatic.  Cardiovascular: Normal rate.   Respiratory: Effort normal.  GI: Soft. Bowel sounds are normal. She exhibits no distension and no mass. There is tenderness (diffuse tenderness to palpation). There is no rebound and no guarding.  Genitourinary: Uterus is tender (mild). Uterus is not enlarged. Cervix exhibits no motion tenderness, no discharge and no friability. Right adnexum displays tenderness. Right  adnexum displays no mass. Left adnexum displays tenderness. Left adnexum displays no mass. There is bleeding (scant brown blood noted) around the vagina. Vaginal discharge (scant thin, white discharge) found.  Neurological: She is alert and oriented to person, place, and time.  Skin: Skin is warm and dry. No erythema.  Psychiatric: She has a normal mood and affect.   Results for orders placed during the hospital encounter of 08/17/13 (from the past 24 hour(s))  URINALYSIS, ROUTINE W REFLEX MICROSCOPIC     Status: Abnormal   Collection Time    08/17/13  2:00 PM      Result Value Ref Range   Color, Urine YELLOW  YELLOW   APPearance HAZY (*) CLEAR   Specific Gravity, Urine 1.025  1.005 - 1.030   pH 6.0  5.0 - 8.0   Glucose, UA NEGATIVE  NEGATIVE mg/dL   Hgb urine dipstick SMALL (*) NEGATIVE   Bilirubin Urine NEGATIVE  NEGATIVE   Ketones, ur NEGATIVE  NEGATIVE mg/dL   Protein, ur NEGATIVE  NEGATIVE mg/dL   Urobilinogen, UA 0.2  0.0 - 1.0 mg/dL   Nitrite NEGATIVE  NEGATIVE   Leukocytes, UA NEGATIVE  NEGATIVE  URINE MICROSCOPIC-ADD ON     Status: Abnormal   Collection Time    08/17/13  2:00 PM      Result Value Ref Range   Squamous Epithelial / LPF FEW (*) RARE   WBC, UA 0-2  <3 WBC/hpf   RBC / HPF 0-2  <3 RBC/hpf   Bacteria, UA FEW (*) RARE   Urine-Other MUCOUS PRESENT    WET PREP, GENITAL     Status: Abnormal   Collection Time    08/17/13  2:52 PM      Result Value Ref Range   Yeast Wet Prep HPF POC NONE SEEN  NONE SEEN   Trich, Wet Prep NONE SEEN  NONE SEEN   Clue Cells Wet Prep HPF POC FEW (*) NONE SEEN   WBC, Wet Prep HPF POC FEW (*) NONE SEEN    MAU Course  Procedures None  MDM UPT - negative UA, wet prep, GC/Chlamydia today Patient states has not taken Ultram today as scheduled for Fibromyalgia. Ultram given in MAU.  1635 - [place] pending. Care turned over to [facility], NP  Freddi Starr, PA-C  08/17/2013, 4:35 PM  Assessment and Plan  US Transvaginal  Non-ob  08/17/2013   CLINICAL DATA:  Pelvic pain for 2 months, history of 2 C sections  EXAM: TRANSABDOMINAL  AND TRANSVAGINAL ULTRASOUND OF PELVIS  TECHNIQUE: Both transabdominal and transvaginal ultrasound examinations of the pelvis were performed. Transabdominal technique was performed for global imaging of the pelvis including uterus, ovaries, adnexal regions, and pelvic cul-de-sac. It was necessary to proceed with endovaginal exam following the transabdominal exam to visualize the ovaries.  COMPARISON:  US PELV - US TRANSVAGINAL dated 04/17/2013  FINDINGS: Uterus  Measurements: 69 x 44 x 44 mm. Uterus is retroverted. There are 2 tiny cystic lesions in the myometrium of the fundus both measuring about 2 mm.  Endometrium  Thickness: 16 mm.  No focal abnormality visualized.  Right ovary  Measurements: 30 x 23 x 19 mm. There is a 23 mm cystic lesion in the right ovary with mildly hyperechoic and mildly thickened rim.  Left ovary  Measurements: 26 x 13 x 18 mm. Normal appearance/no adnexal mass.  Other findings  Trace free fluid in the pelvis  IMPRESSION: 1. Two tiny cystic lesions in the myometrium of fundus, of uncertain significance, and not likely related to the patient's symptoms currently.  2. 23 mm cystic lesion right ovary most consistent with collapsing corpus luteal cyst associated with trace free fluid in the pelvis. Correlate with pregnancy test to determine if follow-up imaging is necessary.   Electronically Signed   By: Esperanza Heiraymond  Rubner M.D.   On: 08/17/2013 17:14   [place] Pelvis Complete  08/17/2013   CLINICAL DATA:  Pelvic pain for 2 months, history of 2 C sections  EXAM: TRANSABDOMINAL AND TRANSVAGINAL ULTRASOUND OF PELVIS  TECHNIQUE: Both transabdominal and transvaginal ultrasound examinations of the pelvis were performed. Transabdominal technique was performed for global imaging of the pelvis including uterus, ovaries, adnexal regions, and pelvic cul-de-sac. It was necessary to proceed with  endovaginal exam following the transabdominal exam to visualize the ovaries.  COMPARISON:  US PELV - US TRANSVAGINAL dated 04/17/2013  FINDINGS: Uterus  Measurements: 69 x 44 x 44 mm. Uterus is retroverted. There are 2 tiny cystic lesions in the myometrium of the fundus both measuring about 2 mm.  Endometrium  Thickness: 16 mm.  No focal abnormality visualized.  Right ovary  Measurements: 30 x 23 x 19 mm. There is a 23 mm cystic lesion in the right ovary with mildly hyperechoic and mildly thickened rim.  Left ovary  Measurements: 26 x 13 x 18 mm. Normal appearance/no adnexal mass.  Other findings  Trace free fluid in the pelvis  IMPRESSION: 1. Two tiny cystic lesions in the myometrium of fundus, of uncertain significance, and not likely related to the patient's symptoms currently.  2. 23 mm cystic lesion right ovary most consistent with collapsing corpus luteal cyst associated with trace free fluid in the pelvis. Correlate with pregnancy test to determine if follow-up imaging is necessary.   Electronically Signed   By: Esperanza Heiraymond  Rubner M.D.   On: 08/17/2013 17:14    Patient requesting additional pain medication. I ask the patient about her allergy to ibuprofen and she states that years ago when she had migraines she took it with her other medications and thought it make her headache worse. Will give Toradol 30 mg IM and hydrocodone PO.   48 y.o. female with probable ruptured ovarian cyst. After medications the patient is feeling better and ready for discharge. She will follow up in the GYN Clinic. She will return here as needed for worsening symptoms. I have reviewed this patient's vital signs, nurses notes, appropriate labs and imaging.  I have discussed findings and plan of care  with the patient. She voices understanding.    [name]

## 2013-08-18 LAB — GC/CHLAMYDIA PROBE AMP
CT Probe RNA: NEGATIVE
GC Probe RNA: NEGATIVE

## 2013-08-20 NOTE — MAU Provider Note (Signed)
Attestation of Attending Supervision of Advanced Practitioner (PA/CNM/NP): Evaluation and management procedures were performed by the Advanced Practitioner under my supervision and collaboration.  I have reviewed the Advanced Practitioner's note and chart, and I agree with the management and plan.  Britney Captain, DO Attending Physician Faculty Practice, [facility]  

## 2013-08-22 ENCOUNTER — Encounter (HOSPITAL_COMMUNITY): Payer: Self-pay | Admitting: Emergency Medicine

## 2013-08-22 ENCOUNTER — Emergency Department (HOSPITAL_COMMUNITY)
Admission: EM | Admit: 2013-08-22 | Discharge: 2013-08-22 | Disposition: A | Discharge status: Home / Self Care | Payer: Medicaid Other | Attending: Emergency Medicine | Admitting: Emergency Medicine

## 2013-08-22 ENCOUNTER — Emergency Department (HOSPITAL_COMMUNITY): Payer: Medicaid Other

## 2013-08-22 DIAGNOSIS — Z862 Personal history of diseases of the blood and blood-forming organs and certain disorders involving the immune mechanism: Secondary | ICD-10-CM | POA: Insufficient documentation

## 2013-08-22 DIAGNOSIS — Z79899 Other long term (current) drug therapy: Secondary | ICD-10-CM | POA: Insufficient documentation

## 2013-08-22 DIAGNOSIS — R1013 Epigastric pain: Secondary | ICD-10-CM | POA: Insufficient documentation

## 2013-08-22 DIAGNOSIS — K219 Gastro-esophageal reflux disease without esophagitis: Secondary | ICD-10-CM | POA: Insufficient documentation

## 2013-08-22 DIAGNOSIS — Z3202 Encounter for pregnancy test, result negative: Secondary | ICD-10-CM | POA: Insufficient documentation

## 2013-08-22 DIAGNOSIS — Z9889 Other specified postprocedural states: Secondary | ICD-10-CM | POA: Insufficient documentation

## 2013-08-22 DIAGNOSIS — R109 Unspecified abdominal pain: Secondary | ICD-10-CM

## 2013-08-22 DIAGNOSIS — Z8709 Personal history of other diseases of the respiratory system: Secondary | ICD-10-CM | POA: Insufficient documentation

## 2013-08-22 DIAGNOSIS — Z872 Personal history of diseases of the skin and subcutaneous tissue: Secondary | ICD-10-CM | POA: Insufficient documentation

## 2013-08-22 DIAGNOSIS — Z880 Allergy status to penicillin: Secondary | ICD-10-CM | POA: Insufficient documentation

## 2013-08-22 DIAGNOSIS — R1012 Left upper quadrant pain: Secondary | ICD-10-CM | POA: Insufficient documentation

## 2013-08-22 DIAGNOSIS — Z8739 Personal history of other diseases of the musculoskeletal system and connective tissue: Secondary | ICD-10-CM | POA: Insufficient documentation

## 2013-08-22 DIAGNOSIS — Z8639 Personal history of other endocrine, nutritional and metabolic disease: Secondary | ICD-10-CM | POA: Insufficient documentation

## 2013-08-22 HISTORY — DX: Vitamin D deficiency, unspecified: E55.9

## 2013-08-22 LAB — CBC WITH DIFFERENTIAL/PLATELET
Basophils Absolute: 0 10*3/uL (ref 0.0–0.1)
Basophils Relative: 0 % (ref 0–1)
Eosinophils Absolute: 0.1 10*3/uL (ref 0.0–0.7)
Eosinophils Relative: 1 % (ref 0–5)
HCT: 36.7 % (ref 36.0–46.0)
Hemoglobin: 12.2 g/dL (ref 12.0–15.0)
Lymphocytes Relative: 43 % (ref 12–46)
Lymphs Abs: 3.7 10*3/uL (ref 0.7–4.0)
MCH: 28.8 pg (ref 26.0–34.0)
MCHC: 33.2 g/dL (ref 30.0–36.0)
MCV: 86.6 fL (ref 78.0–100.0)
Monocytes Absolute: 0.9 10*3/uL (ref 0.1–1.0)
Monocytes Relative: 11 % (ref 3–12)
Neutro Abs: 4 10*3/uL (ref 1.7–7.7)
Neutrophils Relative %: 46 % (ref 43–77)
Platelets: 365 10*3/uL (ref 150–400)
RBC: 4.24 MIL/uL (ref 3.87–5.11)
RDW: 12.9 % (ref 11.5–15.5)
WBC: 8.7 10*3/uL (ref 4.0–10.5)

## 2013-08-22 LAB — COMPREHENSIVE METABOLIC PANEL
ALT: 12 U/L (ref 0–35)
AST: 17 U/L (ref 0–37)
Albumin: 3.5 g/dL (ref 3.5–5.2)
Alkaline Phosphatase: 95 U/L (ref 39–117)
BUN: 9 mg/dL (ref 6–23)
CO2: 23 mEq/L (ref 19–32)
Calcium: 9.4 mg/dL (ref 8.4–10.5)
Chloride: 104 mEq/L (ref 96–112)
Creatinine, Ser: 0.81 mg/dL (ref 0.50–1.10)
GFR calc Af Amer: >90 mL/min (ref >90)
GFR calc non Af Amer: 86 mL/min — ABNORMAL LOW (ref >90)
Glucose, Bld: 105 mg/dL — ABNORMAL HIGH (ref 70–99)
Potassium: 4.1 mEq/L (ref 3.7–5.3)
Sodium: 139 mEq/L (ref 137–147)
Total Bilirubin: <0.2 mg/dL — ABNORMAL LOW (ref 0.3–1.2)
Total Protein: 7.7 g/dL (ref 6.0–8.3)

## 2013-08-22 LAB — URINALYSIS, ROUTINE W REFLEX MICROSCOPIC
Bilirubin Urine: NEGATIVE
Glucose, UA: NEGATIVE mg/dL
Ketones, ur: NEGATIVE mg/dL
Leukocytes, UA: NEGATIVE
Nitrite: NEGATIVE
Protein, ur: NEGATIVE mg/dL
Specific Gravity, Urine: 1.011 (ref 1.005–1.030)
Urobilinogen, UA: 0.2 mg/dL (ref 0.0–1.0)
pH: 5.5 (ref 5.0–8.0)

## 2013-08-22 LAB — URINE MICROSCOPIC-ADD ON

## 2013-08-22 LAB — LIPASE, BLOOD: Lipase: 49 U/L (ref 11–59)

## 2013-08-22 LAB — POC URINE PREG, ED: Preg Test, Ur: NEGATIVE

## 2013-08-22 MED ORDER — SUCRALFATE 1 G PO TABS: 1.0000 g | ORAL_TABLET | Freq: Three times a day (TID) | ORAL | Status: DC

## 2013-08-22 MED ORDER — ONDANSETRON HCL 4 MG/2ML IJ SOLN: 4.0000 mg | Freq: Once | INTRAMUSCULAR | Status: DC

## 2013-08-22 MED ORDER — PANTOPRAZOLE SODIUM 20 MG PO TBEC: 20.0000 mg | DELAYED_RELEASE_TABLET | Freq: Every day | ORAL | Status: DC

## 2013-08-22 MED ORDER — HYDROCODONE-ACETAMINOPHEN 5-325 MG PO TABS: 1.0000 | ORAL_TABLET | Freq: Four times a day (QID) | ORAL | Status: DC | PRN

## 2013-08-22 MED ORDER — HYDROMORPHONE HCL PF 1 MG/ML IJ SOLN
1.0000 mg | Freq: Once | INTRAMUSCULAR | Status: AC
  Administered 2013-08-22: 1 mg via INTRAVENOUS
  Filled 2013-08-22: qty 1

## 2013-08-22 MED ORDER — GI COCKTAIL ~~LOC~~
30.0000 mL | Freq: Once | ORAL | Status: AC
  Administered 2013-08-22: 30 mL via ORAL
  Filled 2013-08-22: qty 30

## 2013-08-22 MED ORDER — ONDANSETRON HCL 4 MG/2ML IJ SOLN
4.0000 mg | Freq: Once | INTRAMUSCULAR | Status: AC
  Administered 2013-08-22: 4 mg via INTRAVENOUS
  Filled 2013-08-22: qty 2

## 2013-08-22 MED ORDER — IOHEXOL 300 MG/ML  SOLN
100.0000 mL | Freq: Once | INTRAMUSCULAR | Status: AC | PRN
  Administered 2013-08-22: 100 mL via INTRAVENOUS

## 2013-08-22 NOTE — ED Notes (Signed)
Patient transported to CT 

## 2013-08-22 NOTE — ED Provider Notes (Signed)
CSN: [id number]     Arrival date & time 08/22/13  2102 History   First MD Initiated Contact with Patient 08/22/13 2130     Chief Complaint  Patient presents with  . Abdominal Pain     (Consider location/radiation/quality/duration/timing/severity/associated sxs/prior Treatment) HPI Comments: Present for several weeks, acutely worsened today when standing up.  Patient is a 48 y.o. female presenting with abdominal pain. The history is provided by the patient.  Abdominal Pain Pain location:  Epigastric Pain quality: sharp   Pain radiates to:  Does not radiate Pain severity:  Moderate Onset quality:  Sudden Timing:  Constant Chronicity:  New Context comment:  Standing up Relieved by:  Nothing Worsened by:  Nothing tried Associated symptoms: vomiting (yesterday, none today)   Associated symptoms: no fever, no nausea and no shortness of breath     Past Medical History  Diagnosis Date  . Acid reflux   . Seasonal allergies   . Cellulitis   . GOITER   . URTICARIA   . FATIGUE   . Palpitations   . Dysuria   . FLANK PAIN, RIGHT   . HEADACHES, HX OF   . Fibromyalgia   . Bleeding gastric ulcer   . Cervical back pain with evidence of disc disease   . Vitamin D deficiency    Past Surgical History  Procedure Laterality Date  . Cesarean section  1988 & 2008    x 2  . Wisdom tooth extraction    . Cartilage surgery      RT wrist  . Cardiac catheterization      05/17/12 / waiting on results   Family History  Problem Relation Age of Onset  . Hypertension Mother   . Huntington's disease Mother   . Diabetes Father   . Hypertension Father   . Heart disease Father   . Scoliosis Sister   . Huntington's disease Sister   . Diabetes Brother   . Hypertension Brother   . Scoliosis Brother   . Skin cancer Maternal Grandmother    History  Substance Use Topics  . Smoking status: Never Smoker   . Smokeless tobacco: Never Used  . Alcohol Use: No     Comment: occassional   OB  History   Grav Para Term Preterm Abortions TAB SAB Ect Mult Living   4 2 2  2 1 1   2      Review of Systems  Constitutional: Negative for fever.  Respiratory: Negative for shortness of breath.   Gastrointestinal: Positive for vomiting (yesterday, none today) and abdominal pain. Negative for nausea.  All other systems reviewed and are negative.     Allergies  Ibuprofen; Morphine and related; Penicillins; Pseudoephedrine; and Adhesive  Home Medications   Prior to Admission medications   Medication Sig Start Date End Date Taking? Authorizing Provider  acetaminophen (TYLENOL) 500 MG tablet Take 500 mg by mouth every 6 (six) hours as needed for moderate pain.    Yes Historical Provider, MD  HYDROcodone-acetaminophen (NORCO) 5-325 MG per tablet Take 1 tablet by mouth every 6 (six) hours as needed for moderate pain. 08/17/13  Yes Hope Orlene Och, NP  ondansetron (ZOFRAN) 4 MG tablet Take 4 mg by mouth every 6 (six) hours as needed for nausea or vomiting.   Yes Historical Provider, MD  pantoprazole (PROTONIX) 40 MG tablet Take 40 mg by mouth daily.   Yes Historical Provider, MD  sucralfate (CARAFATE) 1 G tablet Take 1 g by mouth 3 (three) times daily.  07/12/13  Yes Fayrene HelperBowie Tran, PA-C  traMADol (ULTRAM) 50 MG tablet Take 50 mg by mouth every 6 (six) hours as needed for moderate pain.   Yes Historical Provider, MD  Vitamin D, Ergocalciferol, (DRISDOL) 50000 UNITS CAPS capsule Take 50,000 Units by mouth every 7 (seven) days.   Yes Historical Provider, MD   BP 141/87  Pulse 83  Temp(Src) 98.4 F (36.9 C) (Oral)  Resp 18  Ht 5\' 10"  (1.778 m)  Wt 201 lb (91.173 kg)  BMI 28.84 kg/m2  SpO2 100%  LMP 08/20/2013 Physical Exam  Nursing note and vitals reviewed. Constitutional: She is oriented to person, place, and time. She appears well-developed and well-nourished. No distress.  HENT:  Head: Normocephalic and atraumatic.  Eyes: EOM are normal. Pupils are equal, round, and reactive to light.  Neck:  Normal range of motion. Neck supple.  Cardiovascular: Normal rate and regular rhythm.  Exam reveals no friction rub.   No murmur heard. Pulmonary/Chest: Effort normal and breath sounds normal. No respiratory distress. She has no wheezes. She has no rales.  Abdominal: Soft. She exhibits no distension. There is tenderness (epigastric, LUQ). There is no rebound.  Musculoskeletal: Normal range of motion. She exhibits no edema.  Neurological: She is alert and oriented to person, place, and time. No cranial nerve deficit. She exhibits normal muscle tone.  Skin: No rash noted. She is not diaphoretic.    ED Course  Procedures (including critical care time) Labs Review Labs Reviewed  COMPREHENSIVE METABOLIC PANEL - Abnormal; Notable for the following:    Glucose, Bld 105 (*)    Total Bilirubin <0.2 (*)    GFR calc non Af Amer 86 (*)    All other components within normal limits  URINALYSIS, ROUTINE W REFLEX MICROSCOPIC - Abnormal; Notable for the following:    Hgb urine dipstick MODERATE (*)    All other components within normal limits  CBC WITH DIFFERENTIAL  LIPASE, BLOOD  URINE MICROSCOPIC-ADD ON  POC URINE PREG, ED    Imaging Review Ct Abdomen Pelvis W Contrast  08/22/2013   CLINICAL DATA:  Mid abdominal pain with nausea and vomiting. History of gastric ulcer.  EXAM: CT ABDOMEN AND PELVIS WITH CONTRAST  TECHNIQUE: Multidetector CT imaging of the abdomen and pelvis was performed using the standard protocol following bolus administration of intravenous contrast.  CONTRAST:  100mL OMNIPAQUE IOHEXOL 300 MG/ML  SOLN  COMPARISON:  05/25/2013  FINDINGS: Minimal scarring at the lung bases. No pleural or pericardial fluid.  The liver has a normal appearance without focal lesions or biliary ductal dilatation. No calcified gallstones. The spleen is normal. The pancreas is normal. The adrenal glands are normal. The kidneys are normal. The aorta and IVC are normal. No retroperitoneal mass or adenopathy. No  free intraperitoneal fluid or air. The bladder is normal. Uterus and adnexal regions are normal. No visible bowel pathology. The appendix is normal. No significant bony finding.  IMPRESSION: Negative examination. No pathologic finding in the abdomen or pelvis. No cause of pain identified.   Electronically Signed   By: Paulina FusiMark  Shogry M.D.   On: 08/22/2013 23:17     EKG Interpretation None      MDM   Final diagnoses:  Abdominal pain    3F presents with abdominal pain. Hx of ulcer but denies prior EGD - present for months. Bent over and stood up and had intense worsening of pain. No fevers. Some nausea, but not vomiting or diarrhea.  Here afebrile, vitals stable. Exam  with epigastric pain, mild LUQ pain. No RUQ pain. Will CT. Labs ok. No white count, normal urine. CT normal of abdomen. Patient feeling much better after pain meds. Will put on carafate, PPI, small amount of pain meds. Instructed to f/u with PCP.    Dagmar HaitWilliam Abdon Petrosky, MD 08/22/13 [phone]

## 2013-08-22 NOTE — Discharge Instructions (Signed)
Abdominal Pain, Women °Abdominal (stomach, pelvic, or belly) pain can be caused by many things. It is important to tell your doctor: °· The location of the pain. °· Does it come and go or is it present all the time? °· Are there things that start the pain (eating certain foods, exercise)? °· Are there other symptoms associated with the pain (fever, nausea, vomiting, diarrhea)? °All of this is helpful to know when trying to find the cause of the pain. °CAUSES  °· Stomach: virus or bacteria infection, or ulcer. °· Intestine: appendicitis (inflamed appendix), regional ileitis (Crohn's disease), ulcerative colitis (inflamed colon), irritable bowel syndrome, diverticulitis (inflamed diverticulum of the colon), or cancer of the stomach or intestine. °· Gallbladder disease or stones in the gallbladder. °· Kidney disease, kidney stones, or infection. °· Pancreas infection or cancer. °· Fibromyalgia (pain disorder). °· Diseases of the female organs: °· Uterus: fibroid (non-cancerous) tumors or infection. °· Fallopian tubes: infection or tubal pregnancy. °· Ovary: cysts or tumors. °· Pelvic adhesions (scar tissue). °· Endometriosis (uterus lining tissue growing in the pelvis and on the pelvic organs). °· Pelvic congestion syndrome (female organs filling up with blood just before the menstrual period). °· Pain with the menstrual period. °· Pain with ovulation (producing an egg). °· Pain with an IUD (intrauterine device, birth control) in the uterus. °· Cancer of the female organs. °· Functional pain (pain not caused by a disease, may improve without treatment). °· Psychological pain. °· Depression. °DIAGNOSIS  °Your doctor will decide the seriousness of your pain by doing an examination. °· Blood tests. °· X-rays. °· Ultrasound. °· CT scan (computed tomography, special type of X-ray). °· MRI (magnetic resonance imaging). °· Cultures, for infection. °· Barium enema (dye inserted in the large intestine, to better view it with  X-rays). °· Colonoscopy (looking in intestine with a lighted tube). °· Laparoscopy (minor surgery, looking in abdomen with a lighted tube). °· Major abdominal exploratory surgery (looking in abdomen with a large incision). °TREATMENT  °The treatment will depend on the cause of the pain.  °· Many cases can be observed and treated at home. °· Over-the-counter medicines recommended by your caregiver. °· Prescription medicine. °· Antibiotics, for infection. °· Birth control pills, for painful periods or for ovulation pain. °· Hormone treatment, for endometriosis. °· Nerve blocking injections. °· Physical therapy. °· Antidepressants. °· Counseling with a psychologist or psychiatrist. °· Minor or major surgery. °HOME CARE INSTRUCTIONS  °· Do not take laxatives, unless directed by your caregiver. °· Take over-the-counter pain medicine only if ordered by your caregiver. Do not take aspirin because it can cause an upset stomach or bleeding. °· Try a clear liquid diet (broth or water) as ordered by your caregiver. Slowly move to a bland diet, as tolerated, if the pain is related to the stomach or intestine. °· Have a thermometer and take your temperature several times a day, and record it. °· Bed rest and sleep, if it helps the pain. °· Avoid sexual intercourse, if it causes pain. °· Avoid stressful situations. °· Keep your follow-up appointments and tests, as your caregiver orders. °· If the pain does not go away with medicine or surgery, you may try: °· Acupuncture. °· Relaxation exercises (yoga, meditation). °· Group therapy. °· Counseling. °SEEK MEDICAL CARE IF:  °· You notice certain foods cause stomach pain. °· Your home care treatment is not helping your pain. °· You need stronger pain medicine. °· You want your IUD removed. °· You feel faint or   lightheaded. °· You develop nausea and vomiting. °· You develop a rash. °· You are having side effects or an allergy to your medicine. °SEEK IMMEDIATE MEDICAL CARE IF:  °· Your  pain does not go away or gets worse. °· You have a fever. °· Your pain is felt only in portions of the abdomen. The right side could possibly be appendicitis. The left lower portion of the abdomen could be colitis or diverticulitis. °· You are passing blood in your stools (bright red or black tarry stools, with or without vomiting). °· You have blood in your urine. °· You develop chills, with or without a fever. °· You pass out. °MAKE SURE YOU:  °· Understand these instructions. °· Will watch your condition. °· Will get help right away if you are not doing well or get worse. °Document Released: 02/21/2007 Document Revised: 07/19/2011 Document Reviewed: 03/13/2009 °[employer]® Patient Information ©2014 [employer]. ° °

## 2013-08-22 NOTE — ED Notes (Signed)
Pt states having upper abdominal pain w/ nausea, denies vomiting or diarrhea, states has had pain x months but has recently gotten worse.

## 2013-08-22 NOTE — ED Notes (Signed)
Pt reports upper abd pain that has progressively gotten worse throughout the day; pt c/o nausea with no vomiting; pt states that she bent over earlier and the pain rapidly increased; pt denies injury or straining when she bent over.

## 2013-09-12 ENCOUNTER — Encounter: Payer: Self-pay | Admitting: Obstetrics & Gynecology

## 2013-10-04 ENCOUNTER — Ambulatory Visit: Payer: BC Managed Care – PPO | Admitting: Obstetrics & Gynecology

## 2013-10-16 ENCOUNTER — Other Ambulatory Visit: Payer: Self-pay | Admitting: Internal Medicine

## 2013-10-16 DIAGNOSIS — N644 Mastodynia: Secondary | ICD-10-CM

## 2013-10-22 ENCOUNTER — Encounter (HOSPITAL_COMMUNITY): Payer: Self-pay | Admitting: Emergency Medicine

## 2013-10-22 ENCOUNTER — Emergency Department (HOSPITAL_COMMUNITY)
Admission: EM | Admit: 2013-10-22 | Discharge: 2013-10-23 | Disposition: A | Discharge status: Home / Self Care | Payer: Medicaid Other | Attending: Emergency Medicine | Admitting: Emergency Medicine

## 2013-10-22 ENCOUNTER — Emergency Department (HOSPITAL_COMMUNITY): Payer: Medicaid Other

## 2013-10-22 DIAGNOSIS — Z8739 Personal history of other diseases of the musculoskeletal system and connective tissue: Secondary | ICD-10-CM | POA: Insufficient documentation

## 2013-10-22 DIAGNOSIS — Z872 Personal history of diseases of the skin and subcutaneous tissue: Secondary | ICD-10-CM | POA: Insufficient documentation

## 2013-10-22 DIAGNOSIS — Z8709 Personal history of other diseases of the respiratory system: Secondary | ICD-10-CM | POA: Insufficient documentation

## 2013-10-22 DIAGNOSIS — R1013 Epigastric pain: Secondary | ICD-10-CM

## 2013-10-22 DIAGNOSIS — Z3202 Encounter for pregnancy test, result negative: Secondary | ICD-10-CM | POA: Insufficient documentation

## 2013-10-22 DIAGNOSIS — K219 Gastro-esophageal reflux disease without esophagitis: Secondary | ICD-10-CM | POA: Insufficient documentation

## 2013-10-22 DIAGNOSIS — Z862 Personal history of diseases of the blood and blood-forming organs and certain disorders involving the immune mechanism: Secondary | ICD-10-CM | POA: Insufficient documentation

## 2013-10-22 DIAGNOSIS — Z79899 Other long term (current) drug therapy: Secondary | ICD-10-CM | POA: Insufficient documentation

## 2013-10-22 DIAGNOSIS — Z8639 Personal history of other endocrine, nutritional and metabolic disease: Secondary | ICD-10-CM | POA: Insufficient documentation

## 2013-10-22 HISTORY — DX: Irritable bowel syndrome, unspecified: K58.9

## 2013-10-22 LAB — CBC WITH DIFFERENTIAL/PLATELET
Basophils Absolute: 0 10*3/uL (ref 0.0–0.1)
Basophils Relative: 0 % (ref 0–1)
Eosinophils Absolute: 0.1 10*3/uL (ref 0.0–0.7)
Eosinophils Relative: 1 % (ref 0–5)
HCT: 37.5 % (ref 36.0–46.0)
Hemoglobin: 12.6 g/dL (ref 12.0–15.0)
Lymphocytes Relative: 51 % — ABNORMAL HIGH (ref 12–46)
Lymphs Abs: 4.1 10*3/uL — ABNORMAL HIGH (ref 0.7–4.0)
MCH: 28.8 pg (ref 26.0–34.0)
MCHC: 33.6 g/dL (ref 30.0–36.0)
MCV: 85.6 fL (ref 78.0–100.0)
Monocytes Absolute: 0.7 10*3/uL (ref 0.1–1.0)
Monocytes Relative: 9 % (ref 3–12)
Neutro Abs: 3.1 10*3/uL (ref 1.7–7.7)
Neutrophils Relative %: 39 % — ABNORMAL LOW (ref 43–77)
Platelets: 332 10*3/uL (ref 150–400)
RBC: 4.38 MIL/uL (ref 3.87–5.11)
RDW: 12.8 % (ref 11.5–15.5)
WBC: 8 10*3/uL (ref 4.0–10.5)

## 2013-10-22 LAB — POC URINE PREG, ED: Preg Test, Ur: NEGATIVE

## 2013-10-22 LAB — BASIC METABOLIC PANEL
BUN: 9 mg/dL (ref 6–23)
CO2: 23 mEq/L (ref 19–32)
Calcium: 8.9 mg/dL (ref 8.4–10.5)
Chloride: 103 mEq/L (ref 96–112)
Creatinine, Ser: 0.85 mg/dL (ref 0.50–1.10)
GFR calc Af Amer: >90 mL/min (ref >90)
GFR calc non Af Amer: 81 mL/min — ABNORMAL LOW (ref >90)
Glucose, Bld: 97 mg/dL (ref 70–99)
Potassium: 3.8 mEq/L (ref 3.7–5.3)
Sodium: 138 mEq/L (ref 137–147)

## 2013-10-22 LAB — URINALYSIS, ROUTINE W REFLEX MICROSCOPIC
Bilirubin Urine: NEGATIVE
Glucose, UA: NEGATIVE mg/dL
Hgb urine dipstick: NEGATIVE
Ketones, ur: NEGATIVE mg/dL
Leukocytes, UA: NEGATIVE
Nitrite: NEGATIVE
Protein, ur: NEGATIVE mg/dL
Specific Gravity, Urine: 1.006 (ref 1.005–1.030)
Urobilinogen, UA: 0.2 mg/dL (ref 0.0–1.0)
pH: 5.5 (ref 5.0–8.0)

## 2013-10-22 MED ORDER — PANTOPRAZOLE SODIUM 40 MG PO TBEC
40.0000 mg | DELAYED_RELEASE_TABLET | Freq: Every day | ORAL | Status: DC
  Administered 2013-10-22: 40 mg via ORAL
  Filled 2013-10-22: qty 1

## 2013-10-22 MED ORDER — GI COCKTAIL ~~LOC~~
30.0000 mL | Freq: Once | ORAL | Status: AC
  Administered 2013-10-22: 30 mL via ORAL
  Filled 2013-10-22: qty 30

## 2013-10-22 MED ORDER — PANTOPRAZOLE SODIUM 40 MG PO TBEC: 40.0000 mg | DELAYED_RELEASE_TABLET | Freq: Every day | ORAL | Status: DC

## 2013-10-22 MED ORDER — HYDROCODONE-ACETAMINOPHEN 5-325 MG PO TABS
1.0000 | ORAL_TABLET | ORAL | Status: DC | PRN
Start: 2013-10-22 — End: 2013-11-17

## 2013-10-22 MED ORDER — HYDROMORPHONE HCL PF 1 MG/ML IJ SOLN
1.0000 mg | Freq: Once | INTRAMUSCULAR | Status: AC
  Administered 2013-10-22: 1 mg via INTRAVENOUS
  Filled 2013-10-22: qty 1

## 2013-10-22 MED ORDER — ONDANSETRON HCL 4 MG PO TABS: 4.0000 mg | ORAL_TABLET | Freq: Four times a day (QID) | ORAL | Status: DC

## 2013-10-22 MED ORDER — ONDANSETRON HCL 4 MG/2ML IJ SOLN
4.0000 mg | Freq: Once | INTRAMUSCULAR | Status: AC
  Administered 2013-10-22: 4 mg via INTRAVENOUS
  Filled 2013-10-22: qty 2

## 2013-10-22 NOTE — ED Provider Notes (Signed)
TIME SEEN: 10:04 PM  CHIEF COMPLAINT: Abdominal pain  HPI: Patient is a 48 year old female with history of GERD, fibromyalgia, IBS who presents to the emergency department with complaints of intermittent epigastric pain without radiation this at present for the past year. She states her pain started again this morning. She took 2 tramadol at home and see if the pain is better. She states is worse after eating. She states she's had a prior colonoscopy approximately one year ago she was diagnosed with an ulcer but adamantly denies that she's had an endoscopy. She states she's had chills but no fever. She's had nausea but no vomiting or diarrhea. No further blood per rectum or melena. She has an appointment with the gastroenterologist on June 23. Denies any chest pain or shortness of breath. Denies a history of heavy NSAID use or alcohol use. No history of prior esophageal varices. No prior history of abdominal surgery.  ROS: See HPI Constitutional: no fever  Eyes: no drainage  ENT: no runny nose   Cardiovascular:  no chest pain  Resp: no SOB  GI: no vomiting GU: no dysuria Integumentary: no rash  Allergy: no hives  Musculoskeletal: no leg swelling  Neurological: no slurred speech ROS otherwise negative  PAST MEDICAL HISTORY/PAST SURGICAL HISTORY:  Past Medical History  Diagnosis Date  . Acid reflux   . Seasonal allergies   . Cellulitis   . GOITER   . URTICARIA   . FATIGUE   . Palpitations   . Dysuria   . FLANK PAIN, RIGHT   . HEADACHES, HX OF   . Fibromyalgia   . Bleeding gastric ulcer   . Cervical back pain with evidence of disc disease   . Vitamin D deficiency   . IBS (irritable bowel syndrome)     MEDICATIONS:  Prior to Admission medications   Medication Sig Start Date End Date Taking? Authorizing Provider  diazepam (VALIUM) 2 MG tablet Take 2 mg by mouth every 6 (six) hours as needed for anxiety (ANXIETY).   Yes Historical Provider, MD  HYDROcodone-acetaminophen  (NORCO/VICODIN) 5-325 MG per tablet Take 1 tablet by mouth every 6 (six) hours as needed for moderate pain. 08/22/13  Yes Dagmar HaitWilliam Blair Walden, MD  meclizine (ANTIVERT) 25 MG tablet Take 25 mg by mouth 3 (three) times daily as needed for dizziness (DIZZINESS).   Yes Historical Provider, MD  ondansetron (ZOFRAN) 4 MG tablet Take 4 mg by mouth every 6 (six) hours as needed for nausea or vomiting.   Yes Historical Provider, MD  pantoprazole (PROTONIX) 40 MG tablet Take 40 mg by mouth 2 (two) times daily.    Yes Historical Provider, MD  sucralfate (CARAFATE) 1 G tablet Take 1 tablet (1 g total) by mouth 4 (four) times daily -  with meals and at bedtime. 08/22/13  Yes Dagmar HaitWilliam Blair Walden, MD  traMADol (ULTRAM) 50 MG tablet Take 50 mg by mouth every 6 (six) hours as needed for moderate pain.   Yes Historical Provider, MD  Vitamin D, Ergocalciferol, (DRISDOL) 50000 UNITS CAPS capsule Take 50,000 Units by mouth every 7 (seven) days. MONDAY   Yes Historical Provider, MD    ALLERGIES:  Allergies  Allergen Reactions  . Ibuprofen     "It intensifies my pain."  . Morphine And Related Itching and Other (See Comments)    Reaction burns  . Penicillins Hives and Itching  . Pseudoephedrine Other (See Comments)    Speed up heart rate  . Adhesive [Tape]  Itching and burning at site    SOCIAL HISTORY:  History  Substance Use Topics  . Smoking status: Never Smoker   . Smokeless tobacco: Never Used  . Alcohol Use: No     Comment: occassional    FAMILY HISTORY: Family History  Problem Relation Age of Onset  . Hypertension Mother   . Huntington's disease Mother   . Diabetes Father   . Hypertension Father   . Heart disease Father   . Scoliosis Sister   . Huntington's disease Sister   . Diabetes Brother   . Hypertension Brother   . Scoliosis Brother   . Skin cancer Maternal Grandmother     EXAM: BP 132/85  Pulse 79  Temp(Src) 97.9 F (36.6 C) (Oral)  Resp 16  Ht 5\' 10"  (1.778 m)  Wt 197  lb (89.359 kg)  BMI 28.27 kg/m2  SpO2 100%  LMP 10/22/2013 CONSTITUTIONAL: Alert and oriented and responds appropriately to questions. Well-appearing; well-nourished HEAD: Normocephalic EYES: Conjunctivae clear, PERRL ENT: normal nose; no rhinorrhea; moist mucous membranes; pharynx without lesions noted NECK: Supple, no meningismus, no LAD  CARD: RRR; S1 and S2 appreciated; no murmurs, no clicks, no rubs, no gallops RESP: Normal chest excursion without splinting or tachypnea; breath sounds clear and equal bilaterally; no wheezes, no rhonchi, no rales,  ABD/GI: Normal bowel sounds; non-distended; soft but tender to palpation in the epigastric region, no rebound or guarding, no peritoneal signs BACK:  The back appears normal and is non-tender to palpation, there is no CVA tenderness EXT: Normal ROM in all joints; non-tender to palpation; no edema; normal capillary refill; no cyanosis    SKIN: Normal color for age and race; warm NEURO: Moves all extremities equally PSYCH: The patient's mood and manner are appropriate. Grooming and personal hygiene are appropriate.  MEDICAL DECISION MAKING: Patient here with epigastric pain that she has had intermittently for the past year. Suspect possible also, gastritis. She's had a negative abdominal ultrasound on March 2015 a negative CT 2015. Will obtain labs, urine. We'll give GI cocktail and Dilaudid. Given her pain is reproducible with palpation of her abdomen, I do not think this is ACS or pulmonary embolus. Given her pain has been intermittent for a year, doubt this is been resection and she is very well-appearing.  ED PROGRESS: Labs are unremarkable. EKG shows no significant changes. She is feeling much better after GI cocktail. She is ready on Carafate and omeprazole at home. We'll discharge with prescription for Vicodin and Zofran. Have discussed strict return precautions and importance of keeping her appointment with her gastroenterologist on June 23.  She verbalizes understanding and is comfortable with this plan.     EKG Interpretation  Date/Time:  Monday October 22 2013 22:14:34 EDT Ventricular Rate:  72 PR Interval:  141 QRS Duration: 90 QT Interval:  406 QTC Calculation: 444 R Axis:   64 Text Interpretation:  Sinus rhythm Nonspecific T abnormalities, anterior leads No significant change since last tracing Confirmed by Ethelda ChickJACUBOWITZ  MD, SAM [id number]([id number]) on 10/22/2013 10:19:49 PM         Layla MawKristen N Aizley Stenseth, DO 10/23/13 [id number]

## 2013-10-22 NOTE — Discharge Instructions (Signed)
Possible Peptic Ulcer A peptic ulcer is a sore in the lining of in your esophagus (esophageal ulcer), stomach (gastric ulcer), or in the first part of your small intestine (duodenal ulcer). The ulcer causes erosion into the deeper tissue. CAUSES  Normally, the lining of the stomach and the small intestine protects itself from the acid that digests food. The protective lining can be damaged by:  An infection caused by a bacterium called Helicobacter pylori (H. pylori).  Regular use of nonsteroidal anti-inflammatory drugs (NSAIDs), such as ibuprofen or aspirin.  Smoking tobacco. Other risk factors include being older than 50, drinking alcohol excessively, and having a family history of ulcer disease.  SYMPTOMS   Burning pain or gnawing in the area between the chest and the belly button.  Heartburn.  Nausea and vomiting.  Bloating. The pain can be worse on an empty stomach and at night. If the ulcer results in bleeding, it can cause:  Black, tarry stools.  Vomiting of bright red blood.  Vomiting of coffee ground looking materials. DIAGNOSIS  A diagnosis is usually made based upon your history and an exam. Other tests and procedures may be performed to find the cause of the ulcer. Finding a cause will help determine the best treatment. Tests and procedures may include:  Blood tests, stool tests, or breath tests to check for the bacterium H. pylori.  An upper gastrointestinal (GI) series of the esophagus, stomach, and small intestine.  An endoscopy to examine the esophagus, stomach, and small intestine.  A biopsy. TREATMENT  Treatment may include:  Eliminating the cause of the ulcer, such as smoking, NSAIDs, or alcohol.  Medicines to reduce the amount of acid in your digestive tract.  Antibiotic medicines if the ulcer is caused by the H. pylori bacterium.  An upper endoscopy to treat a bleeding ulcer.  Surgery if the bleeding is severe or if the ulcer created a hole  somewhere in the digestive system. HOME CARE INSTRUCTIONS   Avoid tobacco, alcohol, and caffeine. Smoking can increase the acid in the stomach, and continued smoking will impair the healing of ulcers.  Avoid foods and drinks that seem to cause discomfort or aggravate your ulcer.  Only take medicines as directed by your caregiver. Do not substitute over-the-counter medicines for prescription medicines without talking to your caregiver.  Keep any follow-up appointments and tests as directed. SEEK MEDICAL CARE IF:   Your do not improve within 7 days of starting treatment.  You have ongoing indigestion or heartburn. SEEK IMMEDIATE MEDICAL CARE IF:   You have sudden, sharp, or persistent abdominal pain.  You have bloody or dark black, tarry stools.  You vomit blood or vomit that looks like coffee grounds.  You become light headed, weak, or feel faint.  You become sweaty or clammy. MAKE SURE YOU:   Understand these instructions.  Will watch your condition.  Will get help right away if you are not doing well or get worse. Document Released: 04/23/2000 Document Revised: 01/19/2012 Document Reviewed: 11/24/2011 [facility] Patient Information 2014 [place] [employer], [place].  Continue your carafate and omeprazole as prescribed

## 2013-10-22 NOTE — ED Notes (Signed)
Pt presents with c/o mid to upper abdominal pain. Pt says that she has been seen for the same several times over the past year and has had this pain on and off for the past year. Pt reports that she has had nausea and diarrhea today but no vomiting. Pt ambulatory to room.

## 2013-10-23 ENCOUNTER — Ambulatory Visit
Admission: RE | Admit: 2013-10-23 | Discharge: 2013-10-23 | Disposition: A | Discharge status: Home / Self Care | Payer: Medicaid Other | Source: Ambulatory Visit | Attending: Internal Medicine | Admitting: Internal Medicine

## 2013-10-23 DIAGNOSIS — N644 Mastodynia: Secondary | ICD-10-CM

## 2013-10-30 ENCOUNTER — Ambulatory Visit: Payer: Medicaid Other | Admitting: Gastroenterology

## 2013-11-16 ENCOUNTER — Encounter: Payer: Self-pay | Admitting: Advanced Practice Midwife

## 2013-11-16 ENCOUNTER — Ambulatory Visit (INDEPENDENT_AMBULATORY_CARE_PROVIDER_SITE_OTHER): Payer: Medicaid Other | Admitting: Advanced Practice Midwife

## 2013-11-16 VITALS — BP 110/77 | HR 66 | Temp 97.8°F | Ht 70.0 in | Wt 200.0 lb

## 2013-11-16 DIAGNOSIS — Z0000 Encounter for general adult medical examination without abnormal findings: Secondary | ICD-10-CM

## 2013-11-16 DIAGNOSIS — Z01419 Encounter for gynecological examination (general) (routine) without abnormal findings: Secondary | ICD-10-CM

## 2013-11-16 DIAGNOSIS — N939 Abnormal uterine and vaginal bleeding, unspecified: Secondary | ICD-10-CM | POA: Insufficient documentation

## 2013-11-16 DIAGNOSIS — Z113 Encounter for screening for infections with a predominantly sexual mode of transmission: Secondary | ICD-10-CM

## 2013-11-16 NOTE — Progress Notes (Signed)
Subjective:     Katherine Gregory is a 48 y.o. female and is here for a comprehensive physical exam. The patient reports problems - patient states she has had pain about 3 times in the past year she attributes to ovarian cyst. She denies symptoms of menopause including hot flashes, vaginal dryness. She does report monthly periods and then reports breakthrough bleeding each month. She finds this expensive because of the products she needs to buy.  She is currently under treatment from a Rheumatologist and GI for fibromyalgia and GI concerns.   History   Social History  . Marital Status: Single    Spouse Name: N/A    Number of Children: 2  . Years of Education: N/A   Occupational History  . print tech    Social History Main Topics  . Smoking status: Never Smoker   . Smokeless tobacco: Never Used  . Alcohol Use: 0.6 oz/week    1 Glasses of wine per week     Comment: occassional  . Drug Use: No  . Sexual Activity: Not Currently    Birth Control/ Protection: Surgical     Comment: Tubal Ligation    Other Topics Concern  . Not on file   Social History Narrative  . No narrative on file   Health Maintenance  Topic Date Due  . Pap Smear  06/25/2010  . Influenza Vaccine  12/08/2013  . Tetanus/tdap  03/10/2022    The following portions of the patient's history were reviewed and updated as appropriate: allergies, current medications, past family history, past medical history, past social history, past surgical history and problem list.  Review of Systems Constitutional: positive for malaise Eyes: negative Ears, nose, mouth, throat, and face: negative Respiratory: negative Cardiovascular: negative Gastrointestinal: positive for abdominal pain and change in bowel habits Genitourinary:positive for recent monthly pain assoc. w/ ovarian cyst Integument/breast: negative Hematologic/lymphatic: negative Musculoskeletal:negative Neurological: negative Behavioral/Psych:  negative Endocrine: negative Allergic/Immunologic: negative   Objective:    BP 110/77  Pulse 66  Temp(Src) 97.8 F (36.6 C)  Ht 5\' 10"  (1.778 m)  Wt 200 lb (90.719 kg)  BMI 28.70 kg/m2  LMP 10/22/2013 General appearance: alert and cooperative Head: Normocephalic, without obvious abnormality, atraumatic Eyes: conjunctivae/corneas clear. PERRL, EOM's intact. Fundi benign. Nose: Nares normal. Septum midline. Mucosa normal. No drainage or sinus tenderness. Throat: lips, mucosa, and tongue normal; teeth and gums normal Neck: no adenopathy, no carotid bruit, no JVD, supple, symmetrical, trachea midline and thyroid not enlarged, symmetric, no tenderness/mass/nodules Back: symmetric, no curvature. ROM normal. No CVA tenderness. Lungs: clear to auscultation bilaterally and normal percussion bilaterally Breasts: normal appearance, no masses or tenderness Heart: regular rate and rhythm, S1, S2 normal, no murmur, click, rub or gallop Abdomen: soft, non-tender; bowel sounds normal; no masses,  no organomegaly Pelvic: cervix normal in appearance, external genitalia normal, no adnexal masses or tenderness, no cervical motion tenderness, rectovaginal septum normal, uterus normal size, shape, and consistency and vagina normal without discharge Extremities: extremities normal, atraumatic, no cyanosis or edema Pulses: 2+ and symmetric Skin: Skin color, texture, turgor normal. No rashes or lesions Lymph nodes: Cervical, supraclavicular, and axillary nodes normal. Neurologic: Alert and oriented X 3, normal strength and tone. Normal symmetric reflexes. Normal coordination and gait    Assessment:      Patient Active Problem List   Diagnosis Date Noted  . Bleeding gastric ulcer   . Cervical back pain with evidence of disc disease   . Chest pain 05/16/2012  .  Dizziness 05/16/2012  . Abdominal pain 05/16/2012  . Atypical chest pain 04/21/2012  . Hot flashes 03/10/2012  . Fibromyalgia 03/10/2012  .  Overweight 01/24/2012  . Borderline systolic HTN 11/25/2011  . FLANK PAIN, RIGHT 10/26/2007  . FATIGUE 10/19/2007  . DYSURIA 10/19/2007  . ALLERGIC RHINITIS 08/17/2007  . GOITER 07/25/2007  . GERD 07/25/2007  . PALPITATIONS 07/25/2007  . HEADACHES, HX OF 07/25/2007   Breakthrough monthly bleeding   Plan:      Orders Placed This Encounter  Procedures  . GC/Chlamydia Probe Amp  . TSH  . HIV antibody  . Hepatitis B surface antigen  . RPR  . Hepatitis C antibody  . Hemoglobin A1c   Reviewed options of IUD, medication options and consult w/ MD pertaining to breakthrough bleeding. Patient undecided today about her plan of care. I recommended exercise and weight loss as well for symptom control. Patient currently under treatment for GI and Rheumatoid conditions, she reports once they are under better control she hopes to resume exercise. Annual wellness exam. Recent mammogram complete. Patient to RTC and see MD for further management of abnormal uterine bleeding.  Katherine Gregory Wilson Singer CNM

## 2013-11-17 ENCOUNTER — Encounter (HOSPITAL_COMMUNITY): Payer: Self-pay | Admitting: Emergency Medicine

## 2013-11-17 ENCOUNTER — Emergency Department (HOSPITAL_COMMUNITY)
Admission: EM | Admit: 2013-11-17 | Discharge: 2013-11-18 | Disposition: A | Discharge status: Home / Self Care | Payer: Medicaid Other | Attending: Emergency Medicine | Admitting: Emergency Medicine

## 2013-11-17 DIAGNOSIS — K219 Gastro-esophageal reflux disease without esophagitis: Secondary | ICD-10-CM | POA: Diagnosis not present

## 2013-11-17 DIAGNOSIS — E559 Vitamin D deficiency, unspecified: Secondary | ICD-10-CM | POA: Insufficient documentation

## 2013-11-17 DIAGNOSIS — R1013 Epigastric pain: Secondary | ICD-10-CM | POA: Diagnosis not present

## 2013-11-17 DIAGNOSIS — E049 Nontoxic goiter, unspecified: Secondary | ICD-10-CM | POA: Insufficient documentation

## 2013-11-17 DIAGNOSIS — Z3202 Encounter for pregnancy test, result negative: Secondary | ICD-10-CM | POA: Insufficient documentation

## 2013-11-17 DIAGNOSIS — Z872 Personal history of diseases of the skin and subcutaneous tissue: Secondary | ICD-10-CM | POA: Diagnosis not present

## 2013-11-17 DIAGNOSIS — G8929 Other chronic pain: Secondary | ICD-10-CM | POA: Diagnosis not present

## 2013-11-17 DIAGNOSIS — Z880 Allergy status to penicillin: Secondary | ICD-10-CM | POA: Diagnosis not present

## 2013-11-17 DIAGNOSIS — Z79899 Other long term (current) drug therapy: Secondary | ICD-10-CM | POA: Diagnosis not present

## 2013-11-17 DIAGNOSIS — R109 Unspecified abdominal pain: Secondary | ICD-10-CM | POA: Diagnosis present

## 2013-11-17 LAB — CBC WITH DIFFERENTIAL/PLATELET
Basophils Absolute: 0 10*3/uL (ref 0.0–0.1)
Basophils Relative: 0 % (ref 0–1)
Eosinophils Absolute: 0.1 10*3/uL (ref 0.0–0.7)
Eosinophils Relative: 1 % (ref 0–5)
HCT: 39.1 % (ref 36.0–46.0)
Hemoglobin: 13.1 g/dL (ref 12.0–15.0)
Lymphocytes Relative: 50 % — ABNORMAL HIGH (ref 12–46)
Lymphs Abs: 3.3 10*3/uL (ref 0.7–4.0)
MCH: 28.4 pg (ref 26.0–34.0)
MCHC: 33.5 g/dL (ref 30.0–36.0)
MCV: 84.8 fL (ref 78.0–100.0)
Monocytes Absolute: 0.7 10*3/uL (ref 0.1–1.0)
Monocytes Relative: 10 % (ref 3–12)
Neutro Abs: 2.7 10*3/uL (ref 1.7–7.7)
Neutrophils Relative %: 39 % — ABNORMAL LOW (ref 43–77)
Platelets: 368 10*3/uL (ref 150–400)
RBC: 4.61 MIL/uL (ref 3.87–5.11)
RDW: 13.2 % (ref 11.5–15.5)
WBC: 6.8 10*3/uL (ref 4.0–10.5)

## 2013-11-17 LAB — URINALYSIS, ROUTINE W REFLEX MICROSCOPIC
Bilirubin Urine: NEGATIVE
Glucose, UA: NEGATIVE mg/dL
Ketones, ur: NEGATIVE mg/dL
Leukocytes, UA: NEGATIVE
Nitrite: NEGATIVE
Protein, ur: NEGATIVE mg/dL
Specific Gravity, Urine: 1.002 — ABNORMAL LOW (ref 1.005–1.030)
Urobilinogen, UA: 0.2 mg/dL (ref 0.0–1.0)
pH: 7.5 (ref 5.0–8.0)

## 2013-11-17 LAB — COMPREHENSIVE METABOLIC PANEL
ALT: 10 U/L (ref 0–35)
AST: 12 U/L (ref 0–37)
Albumin: 3.5 g/dL (ref 3.5–5.2)
Alkaline Phosphatase: 116 U/L (ref 39–117)
Anion gap: 12 (ref 5–15)
BUN: 8 mg/dL (ref 6–23)
CO2: 24 mEq/L (ref 19–32)
Calcium: 9.6 mg/dL (ref 8.4–10.5)
Chloride: 104 mEq/L (ref 96–112)
Creatinine, Ser: 0.84 mg/dL (ref 0.50–1.10)
GFR calc Af Amer: >90 mL/min (ref >90)
GFR calc non Af Amer: 82 mL/min — ABNORMAL LOW (ref >90)
Glucose, Bld: 101 mg/dL — ABNORMAL HIGH (ref 70–99)
Potassium: 4.2 mEq/L (ref 3.7–5.3)
Sodium: 140 mEq/L (ref 137–147)
Total Bilirubin: <0.2 mg/dL — ABNORMAL LOW (ref 0.3–1.2)
Total Protein: 8.3 g/dL (ref 6.0–8.3)

## 2013-11-17 LAB — POC URINE PREG, ED: Preg Test, Ur: NEGATIVE

## 2013-11-17 LAB — HEPATITIS B SURFACE ANTIGEN: Hepatitis B Surface Ag: NEGATIVE

## 2013-11-17 LAB — GC/CHLAMYDIA PROBE AMP
CT Probe RNA: NEGATIVE
GC Probe RNA: NEGATIVE

## 2013-11-17 LAB — RPR

## 2013-11-17 LAB — URINE MICROSCOPIC-ADD ON

## 2013-11-17 LAB — HEPATITIS C ANTIBODY: HCV Ab: NEGATIVE

## 2013-11-17 LAB — HEMOGLOBIN A1C
Hgb A1c MFr Bld: 5.6 % (ref <5.7)
Mean Plasma Glucose: 114 mg/dL (ref <117)

## 2013-11-17 LAB — HIV ANTIBODY (ROUTINE TESTING W REFLEX): HIV 1&2 Ab, 4th Generation: NONREACTIVE

## 2013-11-17 LAB — LIPASE, BLOOD: Lipase: 46 U/L (ref 11–59)

## 2013-11-17 LAB — TSH: TSH: 4.303 u[IU]/mL (ref 0.350–4.500)

## 2013-11-17 NOTE — ED Notes (Signed)
Pt transported from home by EMS c/o RUQ pain for "weeks" more severe last hour with diarrhea. Per EMS pt states she ran out of Hydrocodone 2 days ago.

## 2013-11-18 MED ORDER — HYDROCODONE-ACETAMINOPHEN 5-325 MG PO TABS: 1.0000 | ORAL_TABLET | ORAL | Status: DC | PRN

## 2013-11-18 MED ORDER — DICYCLOMINE HCL 20 MG PO TABS
10.0000 mg | ORAL_TABLET | Freq: Once | ORAL | Status: AC
  Administered 2013-11-18: 10 mg via ORAL
  Filled 2013-11-18: qty 1

## 2013-11-18 MED ORDER — HYDROCODONE-ACETAMINOPHEN 5-325 MG PO TABS
1.0000 | ORAL_TABLET | Freq: Once | ORAL | Status: AC
  Administered 2013-11-18: 1 via ORAL
  Filled 2013-11-18: qty 1

## 2013-11-18 MED ORDER — DICYCLOMINE HCL 20 MG PO TABS: 20.0000 mg | ORAL_TABLET | Freq: Two times a day (BID) | ORAL | Status: DC

## 2013-11-18 NOTE — Discharge Instructions (Signed)
Your laboratory testing did not show any signs concerning for any emergency. Please followup with your GI specialist for continued evaluation and treatment.    Abdominal Pain, Women Abdominal (stomach, pelvic, or belly) pain can be caused by many things. It is important to tell your doctor:  The location of the pain.  Does it come and go or is it present all the time?  Are there things that start the pain (eating certain foods, exercise)?  Are there other symptoms associated with the pain (fever, nausea, vomiting, diarrhea)? All of this is helpful to know when trying to find the cause of the pain. CAUSES   Stomach: virus or bacteria infection, or ulcer.  Intestine: appendicitis (inflamed appendix), regional ileitis (Crohn's disease), ulcerative colitis (inflamed colon), irritable bowel syndrome, diverticulitis (inflamed diverticulum of the colon), or cancer of the stomach or intestine.  Gallbladder disease or stones in the gallbladder.  Kidney disease, kidney stones, or infection.  Pancreas infection or cancer.  Fibromyalgia (pain disorder).  Diseases of the female organs:  Uterus: fibroid (non-cancerous) tumors or infection.  Fallopian tubes: infection or tubal pregnancy.  Ovary: cysts or tumors.  Pelvic adhesions (scar tissue).  Endometriosis (uterus lining tissue growing in the pelvis and on the pelvic organs).  Pelvic congestion syndrome (female organs filling up with blood just before the menstrual period).  Pain with the menstrual period.  Pain with ovulation (producing an egg).  Pain with an IUD (intrauterine device, birth control) in the uterus.  Cancer of the female organs.  Functional pain (pain not caused by a disease, may improve without treatment).  Psychological pain.  Depression. DIAGNOSIS  Your doctor will decide the seriousness of your pain by doing an examination.  Blood tests.  X-rays.  Ultrasound.  CT scan (computed tomography,  special type of X-ray).  MRI (magnetic resonance imaging).  Cultures, for infection.  Barium enema (dye inserted in the large intestine, to better view it with X-rays).  Colonoscopy (looking in intestine with a lighted tube).  Laparoscopy (minor surgery, looking in abdomen with a lighted tube).  Major abdominal exploratory surgery (looking in abdomen with a large incision). TREATMENT  The treatment will depend on the cause of the pain.   Many cases can be observed and treated at home.  Over-the-counter medicines recommended by your caregiver.  Prescription medicine.  Antibiotics, for infection.  Birth control pills, for painful periods or for ovulation pain.  Hormone treatment, for endometriosis.  Nerve blocking injections.  Physical therapy.  Antidepressants.  Counseling with a psychologist or psychiatrist.  Minor or major surgery. HOME CARE INSTRUCTIONS   Do not take laxatives, unless directed by your caregiver.  Take over-the-counter pain medicine only if ordered by your caregiver. Do not take aspirin because it can cause an upset stomach or bleeding.  Try a clear liquid diet (broth or water) as ordered by your caregiver. Slowly move to a bland diet, as tolerated, if the pain is related to the stomach or intestine.  Have a thermometer and take your temperature several times a day, and record it.  Bed rest and sleep, if it helps the pain.  Avoid sexual intercourse, if it causes pain.  Avoid stressful situations.  Keep your follow-up appointments and tests, as your caregiver orders.  If the pain does not go away with medicine or surgery, you may try:  Acupuncture.  Relaxation exercises (yoga, meditation).  Group therapy.  Counseling. SEEK MEDICAL CARE IF:   You notice certain foods cause stomach pain.  Your home care treatment is not helping your pain.  You need stronger pain medicine.  You want your IUD removed.  You feel faint or  lightheaded.  You develop nausea and vomiting.  You develop a rash.  You are having side effects or an allergy to your medicine. SEEK IMMEDIATE MEDICAL CARE IF:   Your pain does not go away or gets worse.  You have a fever.  Your pain is felt only in portions of the abdomen. The right side could possibly be appendicitis. The left lower portion of the abdomen could be colitis or diverticulitis.  You are passing blood in your stools (bright red or black tarry stools, with or without vomiting).  You have blood in your urine.  You develop chills, with or without a fever.  You pass out. MAKE SURE YOU:   Understand these instructions.  Will watch your condition.  Will get help right away if you are not doing well or get worse. Document Released: 02/21/2007 Document Revised: 07/19/2011 Document Reviewed: 03/13/2009 [facility] Patient Information 2015 [employer] [place]. This information is not intended to replace advice given to you by your health care provider. Make sure you discuss any questions you have with your health care provider.

## 2013-11-18 NOTE — ED Provider Notes (Signed)
Medical screening examination/treatment/procedure(s) were performed by non-physician practitioner and as supervising physician I was immediately available for consultation/collaboration.   EKG Interpretation None       Tarique Loveall M Willa Brocks, MD 11/18/13 2300 

## 2013-11-18 NOTE — ED Provider Notes (Signed)
CSN: [id number]     Arrival date & time 11/17/13  2101 History   First MD Initiated Contact with Patient 11/18/13 0111     Chief Complaint  Patient presents with  . Abdominal Pain   HPI  Hx provided by the patient. Patient is a 48 year old female with history of continued chronic upper abdominal pain, fibromyalgia, GERD and IBS who presents with continued upper abdominal pain. Patient states that she recently ran out of her hydrocodone and has had worsened upper abdominal pain. She has been evaluated multiple times for her symptoms in the past several months. She has had unremarkable ultrasound studies and CAT scans. She was scheduled for an appointment with the GI specialist on June 23 states that she mistakenly came late to the appointment and they had to reschedule for September. Her symptoms are similar to today although she adds that they seem to radiate more around the sides of her upper abdomen than usual. She denies having any nausea or vomiting. She has had continued loose "diarrhea" type stools. This was worse after running out of her medication. She denies having any fever, chills or sweats. No other aggravating or alleviating factors. No other associated symptoms.   Past Medical History  Diagnosis Date  . Acid reflux   . Seasonal allergies   . Cellulitis   . GOITER   . URTICARIA   . FATIGUE   . Palpitations   . Dysuria   . FLANK PAIN, RIGHT   . HEADACHES, HX OF   . Fibromyalgia   . Bleeding gastric ulcer   . Cervical back pain with evidence of disc disease   . Vitamin D deficiency   . IBS (irritable bowel syndrome)    Past Surgical History  Procedure Laterality Date  . Cesarean section  1988 & 2008    x 2  . Wisdom tooth extraction    . Cartilage surgery      RT wrist  . Cardiac catheterization      05/17/12 / waiting on results   Family History  Problem Relation Age of Onset  . Hypertension Mother   . Huntington's disease Mother   . Diabetes Father   .  Hypertension Father   . Heart disease Father   . Scoliosis Sister   . Huntington's disease Sister   . Diabetes Brother   . Hypertension Brother   . Scoliosis Brother   . Skin cancer Maternal Grandmother    History  Substance Use Topics  . Smoking status: Never Smoker   . Smokeless tobacco: Never Used  . Alcohol Use: 0.6 oz/week    1 Glasses of wine per week     Comment: occassional   OB History   Grav Para Term Preterm Abortions TAB SAB Ect Mult Living   4 2 2  2 1 1   2      Review of Systems  Constitutional: Negative for fever, chills and diaphoresis.  Respiratory: Negative for shortness of breath.   Cardiovascular: Negative for chest pain.  Gastrointestinal: Positive for abdominal pain. Negative for vomiting, diarrhea, constipation and blood in stool.  Genitourinary: Negative for dysuria, frequency, hematuria and flank pain.  All other systems reviewed and are negative.     Allergies  Ibuprofen; Morphine and related; Penicillins; Pseudoephedrine; and Adhesive  Home Medications   Prior to Admission medications   Medication Sig Start Date End Date Taking? Authorizing Provider  diazepam (VALIUM) 2 MG tablet Take 2 mg by mouth every 6 (six) hours as  needed for anxiety (ANXIETY).   Yes Historical Provider, MD  gabapentin (NEURONTIN) 100 MG capsule Take 100 mg by mouth 3 (three) times daily.   Yes Historical Provider, MD  meclizine (ANTIVERT) 25 MG tablet Take 25 mg by mouth 3 (three) times daily as needed for dizziness (DIZZINESS).   Yes Historical Provider, MD  omeprazole (PRILOSEC) 40 MG capsule Take 40 mg by mouth 2 (two) times daily.    Yes Historical Provider, MD  ondansetron (ZOFRAN) 4 MG tablet Take 4 mg by mouth every 6 (six) hours as needed for nausea or vomiting.   Yes Historical Provider, MD  sucralfate (CARAFATE) 1 G tablet Take 1 g by mouth 3 (three) times daily.   Yes Historical Provider, MD  traMADol (ULTRAM) 50 MG tablet Take 50-100 mg by mouth every 12  (twelve) hours as needed for moderate pain.    Yes Historical Provider, MD  Vitamin D, Ergocalciferol, (DRISDOL) 50000 UNITS CAPS capsule Take 50,000 Units by mouth every 7 (seven) days. MONDAY   Yes Historical Provider, MD  zolpidem (AMBIEN) 10 MG tablet Take 10 mg by mouth at bedtime as needed for sleep.   Yes Historical Provider, MD   BP 112/82  Pulse 73  Temp(Src) 98.8 F (37.1 C) (Oral)  Resp 14  Ht 5\' 10"  (1.778 m)  Wt 200 lb (90.719 kg)  BMI 28.70 kg/m2  SpO2 100%  LMP 10/20/2013 Physical Exam  Nursing note and vitals reviewed. Constitutional: She is oriented to person, place, and time. She appears well-developed and well-nourished. No distress.  HENT:  Head: Normocephalic.  Cardiovascular: Normal rate and regular rhythm.   Pulmonary/Chest: Effort normal and breath sounds normal. No respiratory distress. She has no wheezes. She has no rales.  Abdominal: Soft. She exhibits no distension. There is tenderness in the epigastric area and left upper quadrant. There is no rebound and no guarding.  Musculoskeletal: Normal range of motion.  Neurological: She is alert and oriented to person, place, and time.  Skin: Skin is warm and dry. No rash noted.  Psychiatric: She has a normal mood and affect. Her behavior is normal.    ED Course  Procedures   COORDINATION OF CARE:  Nursing notes reviewed. Vital signs reviewed. Initial pt interview and examination performed.   Filed Vitals:   11/17/13 2212 11/17/13 2230  BP: 112/82   Pulse: 73   Temp: 98.8 F (37.1 C)   TempSrc: Oral   Resp: 14   Height:  5\' 10"  (1.778 m)  Weight:  200 lb (90.719 kg)  SpO2: 100%     1:24 AM-patient seen and evaluated. Patient has had similar symptoms of upper quadrant pains. She has had unremarkable workups. Symptoms are worse after she ran out of hydrocodone. Abdomen is soft without peritoneal signs.  Patient has unremarkable laboratory testing. If she has missed her GI appointment and states her  next appointment is in September. I discussed with patient my concern for the use of narcotic medicine to treat her abdominal pain. I expressed my concern that this may end up causing more problems than benefit. I strongly encouraged that she followup with her GI specialist so that she may have further evaluation and diagnosis. I also discussed trial of Bentyl to see if this helps with symptoms. She agreed. She preterm precautions given. No indications for imaging today.   Treatment plan initiated: Medications  HYDROcodone-acetaminophen (NORCO/VICODIN) 5-325 MG per tablet 1 tablet (1 tablet Oral Given 11/18/13 0158)  dicyclomine (BENTYL) tablet 10 mg (  10 mg Oral Given 11/18/13 0158)    Results for orders placed during the hospital encounter of 11/17/13  LIPASE, BLOOD      Result Value Ref Range   Lipase 46  11 - 59 U/L  COMPREHENSIVE METABOLIC PANEL      Result Value Ref Range   Sodium 140  137 - 147 mEq/L   Potassium 4.2  3.7 - 5.3 mEq/L   Chloride 104  96 - 112 mEq/L   CO2 24  19 - 32 mEq/L   Glucose, Bld 101 (*) 70 - 99 mg/dL   BUN 8  6 - 23 mg/dL   Creatinine, Ser 1.61  0.50 - 1.10 mg/dL   Calcium 9.6  8.4 - 09.6 mg/dL   Total Protein 8.3  6.0 - 8.3 g/dL   Albumin 3.5  3.5 - 5.2 g/dL   AST 12  0 - 37 U/L   ALT 10  0 - 35 U/L   Alkaline Phosphatase 116  39 - 117 U/L   Total Bilirubin <0.2 (*) 0.3 - 1.2 mg/dL   GFR calc non Af Amer 82 (*) >90 mL/min   GFR calc Af Amer >90  >90 mL/min   Anion gap 12  5 - 15  CBC WITH DIFFERENTIAL      Result Value Ref Range   WBC 6.8  4.0 - 10.5 K/uL   RBC 4.61  3.87 - 5.11 MIL/uL   Hemoglobin 13.1  12.0 - 15.0 g/dL   HCT 04.5  40.9 - 81.1 %   MCV 84.8  78.0 - 100.0 fL   MCH 28.4  26.0 - 34.0 pg   MCHC 33.5  30.0 - 36.0 g/dL   RDW 91.4  78.2 - 95.6 %   Platelets 368  150 - 400 K/uL   Neutrophils Relative % 39 (*) 43 - 77 %   Lymphocytes Relative 50 (*) 12 - 46 %   Monocytes Relative 10  3 - 12 %   Eosinophils Relative 1  0 - 5 %    Basophils Relative 0  0 - 1 %   Neutro Abs 2.7  1.7 - 7.7 K/uL   Lymphs Abs 3.3  0.7 - 4.0 K/uL   Monocytes Absolute 0.7  0.1 - 1.0 K/uL   Eosinophils Absolute 0.1  0.0 - 0.7 K/uL   Basophils Absolute 0.0  0.0 - 0.1 K/uL   Smear Review MORPHOLOGY UNREMARKABLE    URINALYSIS, ROUTINE W REFLEX MICROSCOPIC      Result Value Ref Range   Color, Urine YELLOW  YELLOW   APPearance CLEAR  CLEAR   Specific Gravity, Urine 1.002 (*) 1.005 - 1.030   pH 7.5  5.0 - 8.0   Glucose, UA NEGATIVE  NEGATIVE mg/dL   Hgb urine dipstick TRACE (*) NEGATIVE   Bilirubin Urine NEGATIVE  NEGATIVE   Ketones, ur NEGATIVE  NEGATIVE mg/dL   Protein, ur NEGATIVE  NEGATIVE mg/dL   Urobilinogen, UA 0.2  0.0 - 1.0 mg/dL   Nitrite NEGATIVE  NEGATIVE   Leukocytes, UA NEGATIVE  NEGATIVE  URINE MICROSCOPIC-ADD ON      Result Value Ref Range   Squamous Epithelial / LPF RARE  RARE   WBC, UA 0-2  <3 WBC/hpf  POC URINE PREG, ED      Result Value Ref Range   Preg Test, Ur NEGATIVE  NEGATIVE      MDM   Final diagnoses:  Chronic epigastric pain        Theron Arista  [place] DragonS Thadeus Gandolfi, PA-C 11/18/13 2258

## 2013-11-19 LAB — PAP IG AND HPV HIGH-RISK: HPV DNA High Risk: NOT DETECTED

## 2013-11-30 ENCOUNTER — Telehealth: Payer: Self-pay | Admitting: *Deleted

## 2013-11-30 NOTE — Telephone Encounter (Signed)
Pt called office requesting lab results. Return call to pt making her aware that all labs done here in our office were WNL.

## 2013-12-13 IMAGING — CR DG CHEST 2V
2 series · 2 of 2 positions shown · non-contrast
Comparison: 08/28/2011

CLINICAL DATA: Chest pain, shortness of breath.

CHEST - 2 VIEW

[w chest pa]
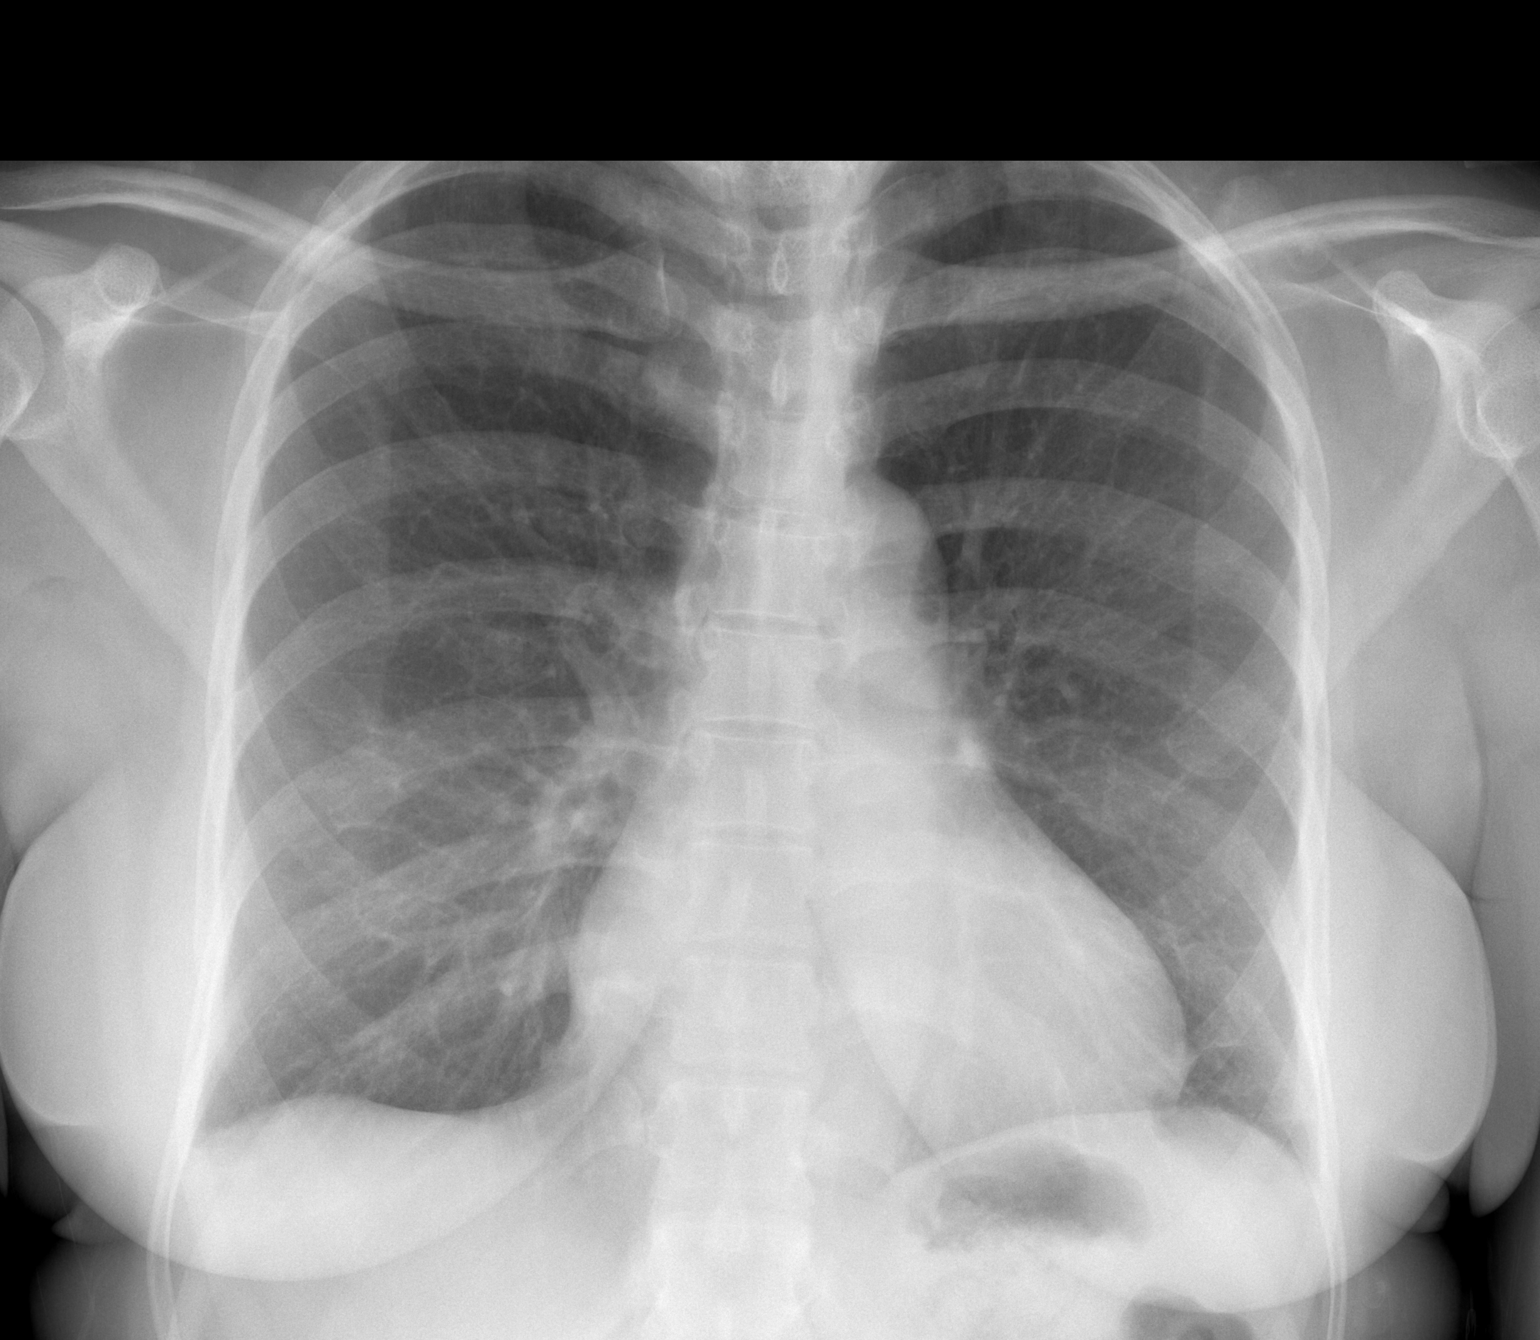

[w chest lat]
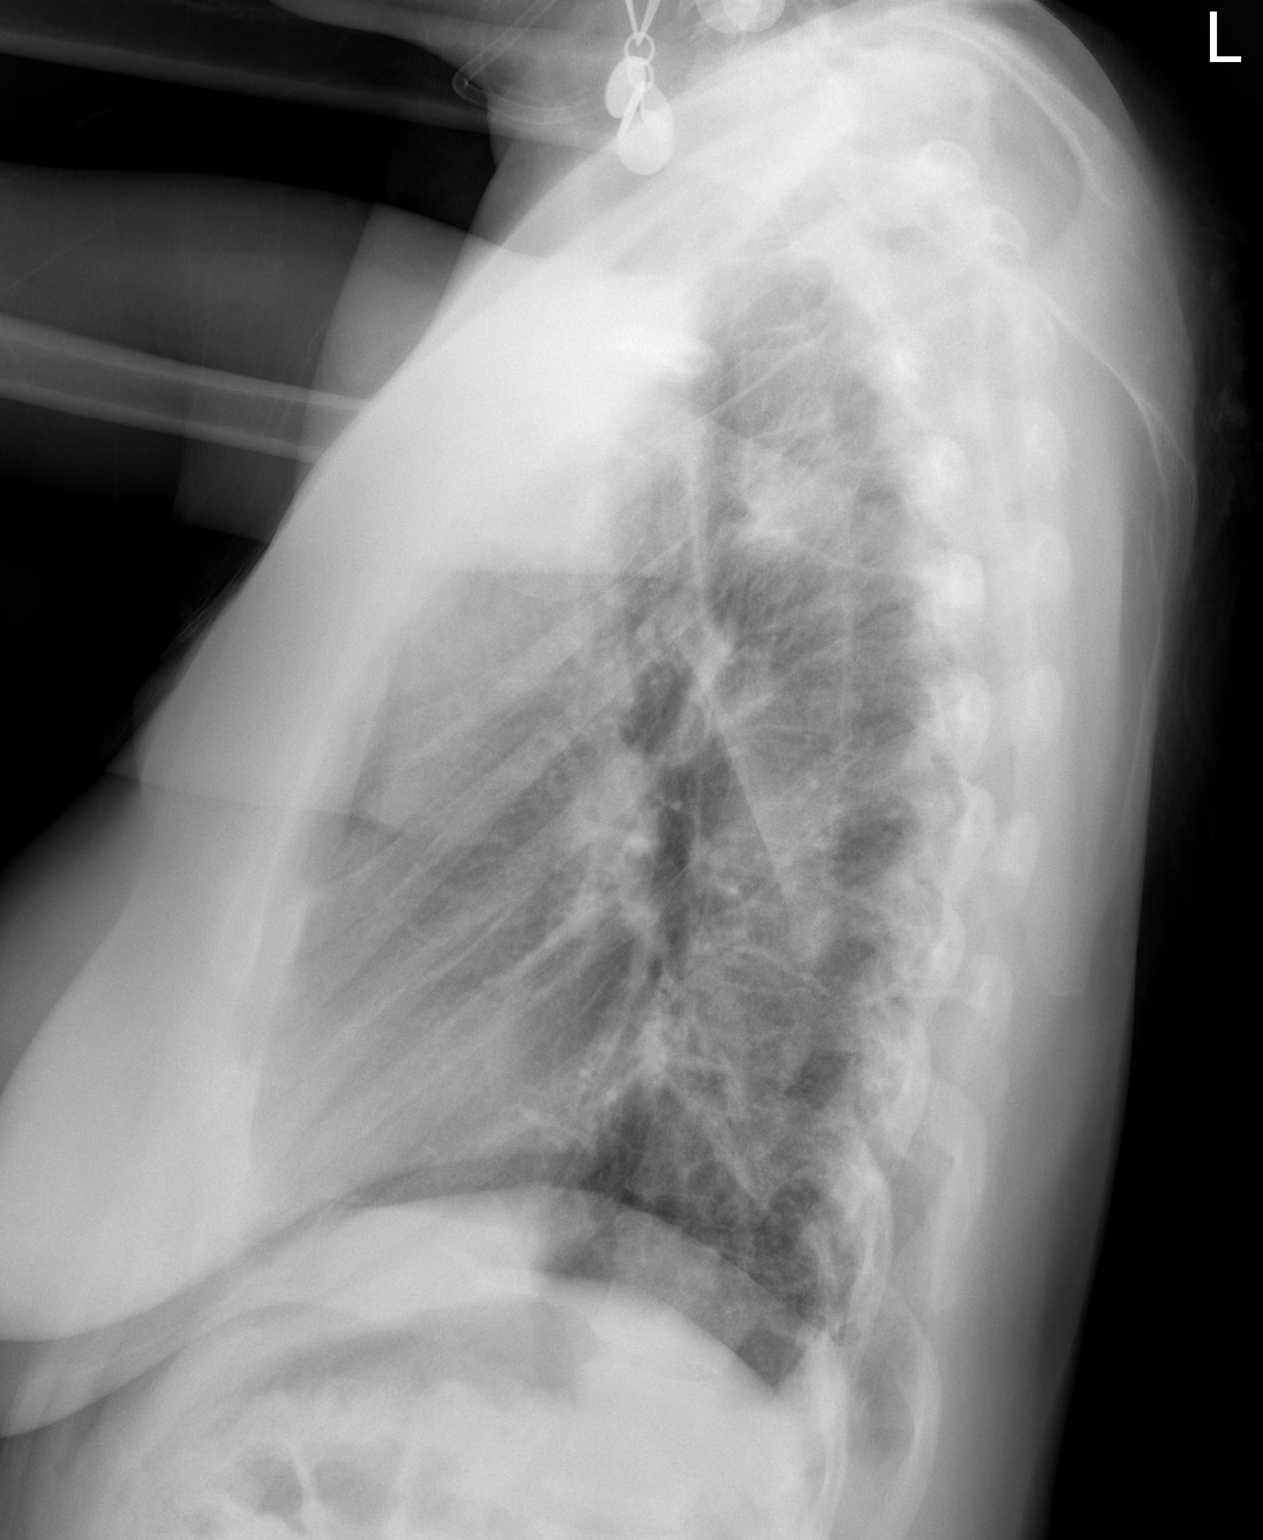

[2 of 2 positions shown; findings below may reference images not displayed]

FINDINGS: There is hyperinflation of the lungs compatible with
COPD.  Heart is borderline enlarged.  No effusions or focal
airspace opacity.  No acute bony abnormality.
IMPRESSION: COPD.  No acute findings.

## 2013-12-19 ENCOUNTER — Telehealth: Payer: Self-pay | Admitting: Licensed Clinical Social Worker

## 2013-12-19 NOTE — Telephone Encounter (Signed)
[name] is not a [facility] of [facility].  However, pt placed call to this CSW for assistance with housing referrals.  [name] is a family member of an [facility].  CSW referred pt to [employer], [employer], [employer] and [employer].  Pt states she does have access to the [facility].  Pt aware none of the above resource provide emergency housing and will need to apply.  Pt currently in the appeal process of her disability application.  CSW provided information to [facility] to provide assistance with Disability Application process.

## 2014-01-08 ENCOUNTER — Encounter: Payer: Self-pay | Admitting: Gastroenterology

## 2014-01-08 ENCOUNTER — Ambulatory Visit (INDEPENDENT_AMBULATORY_CARE_PROVIDER_SITE_OTHER): Payer: Medicaid Other | Admitting: Gastroenterology

## 2014-01-08 VITALS — BP 120/70 | HR 96 | Ht 67.5 in | Wt 207.5 lb

## 2014-01-08 DIAGNOSIS — R1013 Epigastric pain: Secondary | ICD-10-CM

## 2014-01-08 NOTE — Progress Notes (Signed)
Review of pertinent gastrointestinal problems: 1. Abdominal pain led to EGD 05/2012 Christella Hartigan, mild non specific gastritis noted, biopsies showed h. Pylori negative "slight chronic gastritis".  Labs 04/2012: cbc, cmet, amylase, lipase all normal CT scan 08/2010 abd/pelvis with IV contrast essentially normal 2. Routine risk for colon cancer colonoscopy 05/2012 Christella Hartigan (done for minor rectal bleeding, abd pain) found hemorrhoids only.  Recall recommended 10 years.  HPI: This is a  very pleasant 49 year old woman whom I last saw about a year ago.  Back in March, April; Was having intermittent pain in her abdomen.  Upper abd, stabbing pains.  Sharp at times.  Had been on PPI.  Pain could last for hours.  Eating didn't usually cause it.  Nothing made it improve except IV narcotics.  Usually tramadol didn't help.  Weight up 12 pounds since last GI visit 04/2012  CT scan 08/2013 with IV contrast: essentially normal (done for abd pain, vomiting) [place] 07/2013 essentially normal. (done for abd pain) Labs 2015: cbc, cmet, lipase all essentially normal  Has not had any pains in about a month.     Past Medical History  Diagnosis Date  . Acid reflux   . Seasonal allergies   . Cellulitis   . GOITER   . URTICARIA   . FATIGUE   . Palpitations   . Dysuria   . FLANK PAIN, RIGHT   . HEADACHES, HX OF   . Fibromyalgia   . Bleeding gastric ulcer   . Cervical back pain with evidence of disc disease   . Vitamin D deficiency   . IBS (irritable bowel syndrome)   . Vertigo   . Depression   . Post partum depression   . Huntington's disease     Past Surgical History  Procedure Laterality Date  . Cesarean section  1988 & 2008    x 2  . Wisdom tooth extraction    . Cartilage surgery      RT wrist  . Cardiac catheterization      05/17/12 / waiting on results    Current Outpatient Prescriptions  Medication Sig Dispense Refill  . cyclobenzaprine (FLEXERIL) 10 MG tablet Take 10 mg by mouth 2 (two) times  daily as needed for muscle spasms.      . diazepam (VALIUM) 2 MG tablet Take 2 mg by mouth every 6 (six) hours as needed for anxiety (ANXIETY).      [name] dicyclomine (BENTYL) 20 MG tablet Take 1 tablet (20 mg total) by mouth 2 (two) times daily.  20 tablet  0  . gabapentin (NEURONTIN) 100 MG capsule Take 100 mg by mouth 3 (three) times daily.      [name] HYDROcodone-acetaminophen (NORCO/VICODIN) 5-325 MG per tablet Take 1 tablet by mouth every 4 (four) hours as needed for moderate pain.  5 tablet  0  . meclizine (ANTIVERT) 25 MG tablet Take 25 mg by mouth 3 (three) times daily as needed for dizziness (DIZZINESS).      [name] omeprazole (PRILOSEC) 40 MG capsule Take 40 mg by mouth 2 (two) times daily.       . ondansetron (ZOFRAN) 4 MG tablet Take 4 mg by mouth every 6 (six) hours as needed for nausea or vomiting.      . sertraline (ZOLOFT) 50 MG tablet Take 25 mg by mouth daily. Take 1/2 tablet x 2 weeks then take 1 tablet daily      . sucralfate (CARAFATE) 1 G tablet Take 1 g by mouth 3 (three) times daily.      [name]  traMADol (ULTRAM) 50 MG tablet Take 50-100 mg by mouth every 12 (twelve) hours as needed for moderate pain.       . Vitamin D, Ergocalciferol, (DRISDOL) 50000 UNITS CAPS capsule Take 50,000 Units by mouth every 7 (seven) days. MONDAY      . zolpidem (AMBIEN) 10 MG tablet Take 10 mg by mouth at bedtime as needed for sleep.       No current facility-administered medications for this visit.    Allergies as of 01/08/2014 - Review Complete 01/08/2014  Allergen Reaction Noted  . Ibuprofen  04/03/2011  . Morphine and related Itching and Other (See Comments) 04/03/2011  . Penicillins Hives and Itching 08/03/2007  . Pseudoephedrine Other (See Comments)   . Adhesive [tape]  05/30/2012    Family History  Problem Relation Age of Onset  . Hypertension Mother   . Huntington's disease Mother     deceased Jul 27, 2022  . Diabetes Father   . Hypertension Father   . Heart disease Father   . Scoliosis Sister    . Huntington's disease Sister   . Diabetes Brother   . Hypertension Brother   . Scoliosis Brother   . Skin cancer Maternal Grandmother     History   Social History  . Marital Status: Single    Spouse Name: N/A    Number of Children: 2  . Years of Education: N/A   Occupational History  . print tech    Social History Main Topics  . Smoking status: Never Smoker   . Smokeless tobacco: Never Used  . Alcohol Use: 0.6 oz/week    1 Glasses of wine per week     Comment: occassional  . Drug Use: No  . Sexual Activity: Not Currently    Birth Control/ Protection: Surgical     Comment: Tubal Ligation    Other Topics Concern  . Not on file   Social History Narrative  . No narrative on file      Physical Exam: BP 120/70  Pulse 96  Ht 5' 7.5" (1.715 m)  Wt 207 lb 8 oz (94.121 kg)  BMI 32.00 kg/m2  LMP 12/22/2013 Constitutional: generally well-appearing Psychiatric: alert and oriented x3 Abdomen: soft, nontender, nondistended, no obvious ascites, no peritoneal signs, normal bowel sounds     Assessment and plan: 48 y.o. female with recent epigastric abdominal pains  CT scan, ultrasound and lab tests were all essentially normal. The pains are intermittent. Perhaps related to gallbladder dysmotility. I am going to order a HIDA scan for her to estimate her gallbladder ejection fraction. If very low that I'll refer her to a general surgeon to consider elective cholecystectomy.

## 2014-01-08 NOTE — Patient Instructions (Addendum)
HIDA scan to check for biliary dyskinesia, estimate GB EF (for epig abd pains). You have been scheduled for a H[facility] (1st floor) on 01/10/14. Please arrive 15 minutes prior to your scheduled appointment at  930 am. Make certain not to have anything to eat or drink at least 6 hours prior to your test. Should this appointment date or time not work well for you, please call radiology scheduling at [phone].  _____________________________________________________________________ hepatobiliary (HIDA) scan is an imaging procedure used to diagnose problems in the liver, gallbladder and bile ducts. In the HIDA scan, a radioactive chemical or tracer is injected into a vein in your arm. The tracer is handled by the liver like bile. Bile is a fluid produced and excreted by your liver that helps your digestive system break down fats in the foods you eat. Bile is stored in your gallbladder and the gallbladder releases the bile when you eat a meal. A special nuclear medicine scanner (gamma camera) tracks the flow of the tracer from your liver into your gallbladder and small intestine.  During your HIDA scan  You'll be asked to change into a hospital gown before your HIDA scan begins. Your health care team will position you on a table, usually on your back. The radioactive tracer is then injected into a vein in your arm.The tracer travels through your bloodstream to your liver, where it's taken up by the bile-producing cells. The radioactive tracer travels with the bile from your liver into your gallbladder and through your bile ducts to your small intestine.You may feel some pressure while the radioactive tracer is injected into your vein. As you lie on the table, a special gamma camera is positioned over your abdomen taking pictures of the tracer as it moves through your body. The gamma camera takes pictures continually for about an hour. You'll need to keep still during the HIDA scan. This can  become uncomfortable, but you may find that you can lessen the discomfort by taking deep breaths and thinking about other things. Tell your health care team if you're uncomfortable. The radiologist will watch on a computer the progress of the radioactive tracer through your body. The HIDA scan may be stopped when the radioactive tracer is seen in the gallbladder and enters your small intestine. This typically takes about an hour. In some cases extra imaging will be performed if original images aren't satisfactory, if morphine is given to help visualize the gallbladder or if the medication CCK is given to look at the contraction of the gallbladder. This test typically takes 2 hours to complete. ________________________________________________________________________

## 2014-01-10 ENCOUNTER — Ambulatory Visit (HOSPITAL_COMMUNITY): Payer: Medicaid Other

## 2014-01-18 ENCOUNTER — Encounter (HOSPITAL_COMMUNITY)
Admission: RE | Admit: 2014-01-18 | Discharge: 2014-01-18 | Disposition: A | Discharge status: Home / Self Care | Payer: Medicaid Other | Source: Ambulatory Visit | Attending: Diagnostic Radiology | Admitting: Diagnostic Radiology

## 2014-01-18 DIAGNOSIS — K828 Other specified diseases of gallbladder: Secondary | ICD-10-CM | POA: Insufficient documentation

## 2014-01-18 DIAGNOSIS — R1013 Epigastric pain: Secondary | ICD-10-CM

## 2014-01-18 MED ORDER — TECHNETIUM TC 99M MEBROFENIN IV KIT
5.4000 | PACK | Freq: Once | INTRAVENOUS | Status: AC | PRN
  Administered 2014-01-18: 5 via INTRAVENOUS

## 2014-01-22 ENCOUNTER — Other Ambulatory Visit: Payer: Self-pay

## 2014-01-22 DIAGNOSIS — R932 Abnormal findings on diagnostic imaging of liver and biliary tract: Secondary | ICD-10-CM

## 2014-02-22 ENCOUNTER — Other Ambulatory Visit (INDEPENDENT_AMBULATORY_CARE_PROVIDER_SITE_OTHER): Payer: Self-pay | Admitting: General Surgery

## 2014-02-27 ENCOUNTER — Telehealth (INDEPENDENT_AMBULATORY_CARE_PROVIDER_SITE_OTHER): Payer: Self-pay | Admitting: Surgery

## 2014-02-27 NOTE — Telephone Encounter (Signed)
Telephone call from the patient regarding surgical incisions after cholecystectomy. Patient states that Steri-Strips were removed and wounds had opened. Patient instructed in local wound care.  Nursing staff to contact patient on Wednesday, 02/27/2014. May need to be checked in urgent office.  Velora Hecklerodd M. Berea Majkowski, MD, [facility] Office: [phone]

## 2014-02-27 NOTE — Telephone Encounter (Signed)
Wounds will heal on their own.  If signs of infection, just routine wound care.

## 2014-02-27 NOTE — Telephone Encounter (Signed)
Pt of Dr Derrell Lollingamirez. Forwarded to [place].

## 2014-02-27 NOTE — Telephone Encounter (Signed)
Called and spoke to patient she would like to be seen in our urgent office to recheck her incision.  Patient scheduled 02/28/14 @ 3:15pm

## 2014-03-11 ENCOUNTER — Encounter: Payer: Self-pay | Admitting: Gastroenterology

## 2014-04-24 ENCOUNTER — Ambulatory Visit: Payer: Medicaid Other | Admitting: Neurology

## 2014-05-06 ENCOUNTER — Encounter: Payer: Self-pay | Admitting: *Deleted

## 2014-05-07 ENCOUNTER — Encounter: Payer: Self-pay | Admitting: Obstetrics & Gynecology

## 2014-05-13 ENCOUNTER — Ambulatory Visit (INDEPENDENT_AMBULATORY_CARE_PROVIDER_SITE_OTHER): Payer: Medicaid Other | Admitting: Neurology

## 2014-05-13 ENCOUNTER — Encounter: Payer: Self-pay | Admitting: Neurology

## 2014-05-13 ENCOUNTER — Telehealth: Payer: Self-pay | Admitting: Neurology

## 2014-05-13 VITALS — BP 122/70 | HR 65 | Ht 69.0 in | Wt 210.0 lb

## 2014-05-13 DIAGNOSIS — G10 Huntington's disease: Secondary | ICD-10-CM

## 2014-05-13 DIAGNOSIS — R42 Dizziness and giddiness: Secondary | ICD-10-CM

## 2014-05-13 MED ORDER — HALOPERIDOL 1 MG PO TABS: 1.0000 mg | ORAL_TABLET | Freq: Three times a day (TID) | ORAL | Status: DC

## 2014-05-13 MED ORDER — TRAMADOL HCL 50 MG PO TABS: 50.0000 mg | ORAL_TABLET | Freq: Two times a day (BID) | ORAL | Status: DC | PRN

## 2014-05-13 NOTE — Progress Notes (Signed)
PATIENT: Katherine Gregory DOB: 04/20/1966  HISTORICAL  Katherine Gregory  is a 49 years old left-handed African-American female, referred by her primary care physician Dr. Samantha Crimes for Hungtingons diseaes.   She was diagnosed with Huntington's disease by [facility] Boris Sharper in November 2014, based on her symptoms, positive family history, and genetic testing,  Her mother suffered Huntington's disease, diagnosis was made at age 58, passed away at age 9 in 2015/03/13her younger sister at age 6 was also diagnosed with Huntington's disease, her mother has 7 children, 4 girls, 3 boys.  Katherine Gregory lives with her sister, she has a daughter at age 36, grand daughter age 88, she also has a son at age 65,  She become symptomatic around 2013, she noticed extreme fatigue, eventually has to quit her job because of her symptoms, initially she was diagnosed with fibromyalgia, later she developed chorea movement, gait difficulty, recent few weeks, she also developed uncontrollable neck jerking movement, which has been very bothersome,   after diagnosis was made by Dr. [name] in November 2014, she will be seen again in one year  The most bothersome symptoms for her is chorea movement , especially her abnormal neck jerking movement, gait difficulty, she also dealing with depression, Zoloft has been very helpful.  REVIEW OF SYSTEMS: Full 14 system review of systems performed and notable only for as above   ALLERGIES: Allergies  Allergen Reactions  . Ibuprofen Other (See Comments)    "It intensifies my pain." Made pain and symptoms worse. "It intensifies my pain."  . Morphine Itching    Red blotches on skin and burning.  . Morphine And Related Itching and Other (See Comments)    Reaction burns Reaction burns  . Penicillin G Itching  . Penicillins Hives and Itching  . Adhesive [Tape] Itching    Itching and burning at site Itching and burning at site  . Pseudoephedrine Other (See Comments)  and Palpitations    Speed up heart rate Speed up heart rate    HOME MEDICATIONS: Current Outpatient Prescriptions on File Prior to Visit  Medication Sig Dispense Refill  . cyclobenzaprine (FLEXERIL) 10 MG tablet Take 10 mg by mouth 2 (two) times daily as needed for muscle spasms.    . diazepam (VALIUM) 2 MG tablet Take 2 mg by mouth every 6 (six) hours as needed for anxiety (ANXIETY).    [name] gabapentin (NEURONTIN) 100 MG capsule Take 100 mg by mouth 3 (three) times daily.    [name] HYDROcodone-acetaminophen (NORCO/VICODIN) 5-325 MG per tablet Take 1 tablet by mouth every 4 (four) hours as needed for moderate pain. 5 tablet 0  . meclizine (ANTIVERT) 25 MG tablet Take 25 mg by mouth 3 (three) times daily as needed for dizziness (DIZZINESS).    [name] omeprazole (PRILOSEC) 40 MG capsule Take 40 mg by mouth 2 (two) times daily.     . ondansetron (ZOFRAN) 4 MG tablet Take 4 mg by mouth every 6 (six) hours as needed for nausea or vomiting.    . sertraline (ZOLOFT) 50 MG tablet Take 25 mg by mouth daily. Take 1/2 tablet x 2 weeks then take 1 tablet daily    . traMADol (ULTRAM) 50 MG tablet Take 50-100 mg by mouth every 12 (twelve) hours as needed for moderate pain.     . Vitamin D, Ergocalciferol, (DRISDOL) 50000 UNITS CAPS capsule Take 50,000 Units by mouth every 7 (seven) days. MONDAY    . zolpidem (AMBIEN) 10 MG  tablet Take 10 mg by mouth at bedtime as needed for sleep.     No current facility-administered medications on file prior to visit.    PAST MEDICAL HISTORY: Past Medical History  Diagnosis Date  . Acid reflux   . Seasonal allergies   . Cellulitis   . GOITER   . URTICARIA   . FATIGUE   . Palpitations   . Dysuria   . FLANK PAIN, RIGHT   . HEADACHES, HX OF   . Fibromyalgia   . Bleeding gastric ulcer   . Cervical back pain with evidence of disc disease   . Vitamin D deficiency   . IBS (irritable bowel syndrome)   . Vertigo   . Depression   . Post partum depression   . Huntington's  disease     PAST SURGICAL HISTORY: Past Surgical History  Procedure Laterality Date  . Cesarean section  1988 & 2008    x 2  . Wisdom tooth extraction    . Cartilage surgery      RT wrist  . Cardiac catheterization      FAMILY HISTORY: Family History  Problem Relation Age of Onset  . Hypertension Mother   . Huntington's disease Mother     deceased 07-25-2022  . Diabetes Father   . Hypertension Father   . Heart disease Father   . Scoliosis Sister   . Huntington's disease Sister   . Diabetes Brother   . Hypertension Brother   . Scoliosis Brother   . Skin cancer Maternal Grandmother     SOCIAL HISTORY:  History   Social History  . Marital Status: Single    Spouse Name: N/A    Number of Children: 2  . Years of Education: N/A   Occupational History  . print tech    Social History Main Topics  . Smoking status: Never Smoker   . Smokeless tobacco: Never Used  . Alcohol Use: 0.6 oz/week    1 Glasses of wine per week     Comment: occassional  . Drug Use: No  . Sexual Activity: Not Currently    Birth Control/ Protection: Surgical     Comment: Tubal Ligation    Other Topics Concern  . Not on file   Social History Narrative   Patient lives at home with a family member. Patient is single.   Disabled.   Education some college.   Left handed.   Caffeine some times not daily.     PHYSICAL EXAM   Filed Vitals:   05/13/14 0911  BP: 122/70  Pulse: 65  Height:  (1.753 m)  Weight: 210 lb (95.255 kg)    Not recorded      Body mass index is 31 kg/(m^2).   Generalized: In no acute distress  Neck: Supple, no carotid bruits   Cardiac: Regular rate rhythm  Pulmonary: Clear to auscultation bilaterally  Musculoskeletal: No deformity  Neurological examination  Mentation: Constant chorea movement, neck jerking movement, bilateral upper extremity chorea movement  Cranial nerve II-XII: Pupils were equal round reactive to light. Extraocular movements were  full.  Visual field were full on confrontational test.  Hearing was intact to finger rubbing bilaterally. Uvula tongue midline.  Head turning and shoulder shrug and were normal and symmetric.Tongue protrusion into cheek strength was normal.  Motor: Normal tone, bulk and strength.  Sensory: Intact to fine touch, pinprick, preserved vibratory sensation, and proprioception at toes.  Coordination: She has profound finger to nose, heel-to-shin dysmetria  Gait: Rising up from seated position by pushing on wheelchair, ataxic, very unsteady gait    Deep tendon reflexes: Brachioradialis 2/2, biceps 2/2, triceps 2/2, patellar 2/2, Achilles 2/2, plantar responses were flexor bilaterally.   DIAGNOSTIC DATA (LABS, IMAGING, TESTING) - I reviewed patient records, labs, notes, testing and imaging myself where available.  Lab Results  Component Value Date   WBC 6.8 11/17/2013   HGB 13.1 11/17/2013   HCT 39.1 11/17/2013   MCV 84.8 11/17/2013   PLT 368 11/17/2013      Component Value Date/Time   NA 140 11/17/2013 2303   K 4.2 11/17/2013 2303   CL 104 11/17/2013 2303   CO2 24 11/17/2013 2303   GLUCOSE 101* 11/17/2013 2303   BUN 8 11/17/2013 2303   CREATININE 0.84 11/17/2013 2303   CREATININE 0.87 04/28/2012 1727   CALCIUM 9.6 11/17/2013 2303   PROT 8.3 11/17/2013 2303   ALBUMIN 3.5 11/17/2013 2303   AST 12 11/17/2013 2303   ALT 10 11/17/2013 2303   ALKPHOS 116 11/17/2013 2303   BILITOT <0.2* 11/17/2013 2303   GFRNONAA 82* 11/17/2013 2303   GFRAA >90 11/17/2013 2303   Lab Results  Component Value Date   CHOL 191 03/10/2012   HDL 52.10 03/10/2012   LDLCALC 109* 03/10/2012   TRIG 150.0* 03/10/2012   CHOLHDL 4 03/10/2012   Lab Results  Component Value Date   HGBA1C 5.6 11/16/2013   No results found for: [id number] Lab Results  Component Value Date   TSH 4.303 11/16/2013    ASSESSMENT AND PLAN  Braxtyn ELLI GROESBECK is a 49 y.o. female with genetic conformed Huntington's disease,  also with typical chorea movement, positive family history   I will add on Haldol 1 mg 3 times a day for her chorea movement   return to clinic in 2-3 months,   Levert Feinstein, M.D. Ph.D.  [facility] [address], [address] [place], [place] [address] [phone]

## 2014-05-13 NOTE — Telephone Encounter (Signed)
I contacted ins and provided clinical info the required.  Request is currently under review.  I called the patient back.  She is aware.

## 2014-05-13 NOTE — Telephone Encounter (Signed)
Pt is calling stating that medicaid needs to be contacted in order for her to get the medication haloperidol (HALDOL) 1 MG tablet. She is at [facility] and that's what they told her.  Please call and advise.

## 2014-05-16 ENCOUNTER — Telehealth: Payer: Self-pay | Admitting: *Deleted

## 2014-05-16 NOTE — Telephone Encounter (Signed)
Patient called back with name of medication, CLONAZEPAM 1 mg.  Please call and advise.

## 2014-05-16 NOTE — Telephone Encounter (Signed)
I called the pharmacy who said the patient's Rx for Haldol is ready for pick up with a $3 co-pay.  She said there is another Rx from a different doctor they are having issues with.  I called the patient back.  She is aware Rx is ready for pick up.  Says the med from other doctor is what was delayed, apparently there was some confusion regarding which med she was waiting on.  Indicates nothing further is needed at this time.  She will take the med we prescribed and call us back if anything further is needed.

## 2014-05-25 ENCOUNTER — Emergency Department: Payer: Self-pay | Admitting: Emergency Medicine

## 2014-05-25 LAB — COMPREHENSIVE METABOLIC PANEL
Albumin: 3.3 g/dL — ABNORMAL LOW (ref 3.4–5.0)
Alkaline Phosphatase: 132 U/L — ABNORMAL HIGH
Anion Gap: 7 (ref 7–16)
BUN: 9 mg/dL (ref 7–18)
Bilirubin,Total: 0.2 mg/dL (ref 0.2–1.0)
Calcium, Total: 9.2 mg/dL (ref 8.5–10.1)
Chloride: 105 mmol/L (ref 98–107)
Co2: 27 mmol/L (ref 21–32)
Creatinine: 0.91 mg/dL (ref 0.60–1.30)
EGFR (African American): >60
EGFR (Non-African Amer.): >60
Glucose: 88 mg/dL (ref 65–99)
Osmolality: 276 (ref 275–301)
Potassium: 3.8 mmol/L (ref 3.5–5.1)
SGOT(AST): 29 U/L (ref 15–37)
SGPT (ALT): 24 U/L
Sodium: 139 mmol/L (ref 136–145)
Total Protein: 8 g/dL (ref 6.4–8.2)

## 2014-05-25 LAB — URINALYSIS, COMPLETE
Bilirubin,UR: NEGATIVE
Blood: NEGATIVE
Glucose,UR: NEGATIVE mg/dL (ref 0–75)
Ketone: NEGATIVE
Leukocyte Esterase: NEGATIVE
Nitrite: NEGATIVE
Ph: 5 (ref 4.5–8.0)
Protein: NEGATIVE
RBC,UR: NONE SEEN /HPF (ref 0–5)
Specific Gravity: 1.008 (ref 1.003–1.030)
Squamous Epithelial: 1
WBC UR: 1 /HPF (ref 0–5)

## 2014-05-25 LAB — CBC
HCT: 40 % (ref 35.0–47.0)
HGB: 12.8 g/dL (ref 12.0–16.0)
MCH: 27.1 pg (ref 26.0–34.0)
MCHC: 32 g/dL (ref 32.0–36.0)
MCV: 85 fL (ref 80–100)
Platelet: 329 10*3/uL (ref 150–440)
RBC: 4.72 10*6/uL (ref 3.80–5.20)
RDW: 14.8 % — ABNORMAL HIGH (ref 11.5–14.5)
WBC: 8.3 10*3/uL (ref 3.6–11.0)

## 2014-05-25 LAB — LIPASE, BLOOD: Lipase: 145 U/L (ref 73–393)

## 2014-05-30 ENCOUNTER — Telehealth: Payer: Self-pay | Admitting: Neurology

## 2014-05-30 NOTE — Telephone Encounter (Signed)
I have failed to reach patient, left message to discuss her medication options at follow up visit in Feb 8th 2016

## 2014-05-30 NOTE — Telephone Encounter (Signed)
Patient requesting to change Rx haloperidol (HALDOL) 1 MG tablet to Rx CLONAZEPAM 1 mg, which she feels will help her with her balance.  Please call and advise.

## 2014-06-10 ENCOUNTER — Telehealth: Payer: Self-pay | Admitting: *Deleted

## 2014-06-10 NOTE — Telephone Encounter (Signed)
Patient calling stating that her lawyers have submitted disability records electronically. Patient wants to know if the forms were received and if they are ready. Please advise.

## 2014-06-11 ENCOUNTER — Ambulatory Visit (HOSPITAL_BASED_OUTPATIENT_CLINIC_OR_DEPARTMENT_OTHER): Payer: Medicaid Other | Attending: Internal Medicine | Admitting: Radiology

## 2014-06-11 ENCOUNTER — Telehealth: Payer: Self-pay | Admitting: *Deleted

## 2014-06-11 VITALS — Ht 70.0 in | Wt 210.0 lb

## 2014-06-11 DIAGNOSIS — G4733 Obstructive sleep apnea (adult) (pediatric): Secondary | ICD-10-CM | POA: Insufficient documentation

## 2014-06-11 NOTE — Telephone Encounter (Signed)
Need a release form from the patient.

## 2014-06-11 NOTE — Telephone Encounter (Signed)
Need a release.

## 2014-06-17 ENCOUNTER — Ambulatory Visit: Payer: Medicaid Other | Admitting: Neurology

## 2014-06-18 ENCOUNTER — Encounter: Payer: Self-pay | Admitting: Neurology

## 2014-06-20 ENCOUNTER — Encounter: Payer: Self-pay | Admitting: Neurology

## 2014-06-20 ENCOUNTER — Ambulatory Visit (INDEPENDENT_AMBULATORY_CARE_PROVIDER_SITE_OTHER): Payer: Medicaid Other | Admitting: Neurology

## 2014-06-20 VITALS — BP 106/70 | HR 89 | Ht 70.0 in | Wt 222.0 lb

## 2014-06-20 DIAGNOSIS — G10 Huntington's disease: Secondary | ICD-10-CM

## 2014-06-20 MED ORDER — CELECOXIB 100 MG PO CAPS: 100.0000 mg | ORAL_CAPSULE | Freq: Two times a day (BID) | ORAL | Status: DC

## 2014-06-20 NOTE — Progress Notes (Signed)
PATIENT: Katherine Gregory DOB: 1965-06-26  HISTORICAL  Katherine Gregory  is a 49 years old left-handed African-American female, referred by her primary care physician Dr. Samantha CrimesAvbueve for Hungtingons diseaes.   She was diagnosed with Huntington's disease by [facility]. Boris SharperFrancis Walker in November 2014, based on her symptoms, positive family history, and genetic testing,  Her mother suffered Huntington's disease, diagnosis was made at age 766, passed away at age 48 in March 2015, her younger sister at age 48 was also diagnosed with Huntington's disease, her mother has 7 children, 4 girls, 3 boys.  Katherine Gregory lives with her sister, she has a daughter at age 48, grand daughter age 74, she also has a son at age 577,  She become symptomatic around 2013, she noticed extreme fatigue, eventually has to quit her job because of her symptoms, initially she was diagnosed with fibromyalgia, later she developed chorea movement, gait difficulty, recent few weeks, she also developed uncontrollable neck jerking movement, which has been very bothersome,   after diagnosis was made by Dr. Dan HumphreysWalker in November 2014, she will be seen again in one year  The most bothersome symptoms for her is chorea movement , especially her abnormal neck jerking movement, gait difficulty, she also dealing with depression, Zoloft has been very helpful.  UPDATE Feb 11 th 2016: Hadol 1mg  tid has been very helpful, she has much less neck jerking movement, she continued to complains of extreme fatigue, bilateral knee pain, worsening gait difficulty, she also has episode of difficulty swallowing, bowel incontinence, Is in the process of applying for disability  REVIEW OF SYSTEMS: Full 14 system review of systems performed and notable only for fatigue, excessive sweating, hearing loss, ringing ears, eye redness, light sensitivity, double vision, blurry vision, abdominal pain, constipation, restless leg, insomnia, daytime sleepiness, joints pain,  joint swelling, aching muscles, walking difficulties, neck pain, neck stiffness, memory loss, dizziness, headaches, confusion, depression  ALLERGIES: Allergies  Allergen Reactions  . Ibuprofen Other (See Comments)    "It intensifies my pain." Made pain and symptoms worse. "It intensifies my pain."  . Morphine Itching    Red blotches on skin and burning.  . Morphine And Related Itching and Other (See Comments)    Reaction burns Reaction burns  . Penicillin G Itching  . Penicillins Hives and Itching  . Adhesive [Tape] Itching    Itching and burning at site Itching and burning at site  . Pseudoephedrine Other (See Comments) and Palpitations    Speed up heart rate Speed up heart rate    HOME MEDICATIONS: Current Outpatient Prescriptions on File Prior to Visit  Medication Sig Dispense Refill  . diazepam (VALIUM) 2 MG tablet Take 2 mg by mouth every 6 (six) hours as needed for anxiety (ANXIETY).    [name]. gabapentin (NEURONTIN) 300 MG capsule Take 900 mg by mouth 3 (three) times daily.    . haloperidol (HALDOL) 1 MG tablet Take 1 tablet (1 mg total) by mouth 3 (three) times daily. 90 tablet 3  . meclizine (ANTIVERT) 25 MG tablet Take 25 mg by mouth 3 (three) times daily as needed for dizziness (DIZZINESS).    . ondansetron (ZOFRAN) 4 MG tablet Take 4 mg by mouth every 6 (six) hours as needed for nausea or vomiting.    . sertraline (ZOLOFT) 50 MG tablet Take 25 mg by mouth daily. Take 1/2 tablet x 2 weeks then take 1 tablet daily    . traMADol (ULTRAM) 50 MG tablet Take 1-2  tablets (50-100 mg total) by mouth every 12 (twelve) hours as needed for moderate pain. 60 tablet 3  . Vitamin D, Ergocalciferol, (DRISDOL) 50000 UNITS CAPS capsule Take 50,000 Units by mouth every 7 (seven) days. MONDAY     No current facility-administered medications on file prior to visit.    PAST MEDICAL HISTORY: Past Medical History  Diagnosis Date  . Acid reflux   . Seasonal allergies   . Cellulitis   .  GOITER   . URTICARIA   . FATIGUE   . Palpitations   . Dysuria   . FLANK PAIN, RIGHT   . HEADACHES, HX OF   . Fibromyalgia   . Bleeding gastric ulcer   . Cervical back pain with evidence of disc disease   . Vitamin D deficiency   . IBS (irritable bowel syndrome)   . Vertigo   . Depression   . Post partum depression   . Huntington's disease     PAST SURGICAL HISTORY: Past Surgical History  Procedure Laterality Date  . Cesarean section  1988 & 2008    x 2  . Wisdom tooth extraction    . Cartilage surgery      RT wrist  . Cardiac catheterization      FAMILY HISTORY: Family History  Problem Relation Age of Onset  . Hypertension Mother   . Huntington's disease Mother     deceased 08-11-2022  . Diabetes Father   . Hypertension Father   . Heart disease Father   . Scoliosis Sister   . Huntington's disease Sister   . Diabetes Brother   . Hypertension Brother   . Scoliosis Brother   . Skin cancer Maternal Grandmother     SOCIAL HISTORY:  History   Social History  . Marital Status: Single    Spouse Name: N/A  . Number of Children: 2  . Years of Education: N/A   Occupational History  . print tech    Social History Main Topics  . Smoking status: Never Smoker   . Smokeless tobacco: Never Used  . Alcohol Use: 0.6 oz/week    1 Glasses of wine per week     Comment: occassional  . Drug Use: No  . Sexual Activity: Not Currently    Birth Control/ Protection: Surgical     Comment: Tubal Ligation    Other Topics Concern  . Not on file   Social History Narrative   Patient lives at home with a family member. Patient is single.   Disabled.   Education some college.   Left handed.   Caffeine some times not daily.     PHYSICAL EXAM   Filed Vitals:   06/20/14 1153  BP: 106/70  Pulse: 89  Height:  (1.778 m)  Weight: 222 lb (100.699 kg)    Not recorded      Body mass index is 31.85 kg/(m^2).   Generalized: In no acute distress  Neck: Supple, no  carotid bruits   Cardiac: Regular rate rhythm  Pulmonary: Clear to auscultation bilaterally  Musculoskeletal: No deformity  Neurological examination  Mentation: Depressed looking middle-age female  Cranial nerve II-XII: Pupils were equal round reactive to light. Extraocular movements were full.  Visual field were full on confrontational test.  Hearing was intact to finger rubbing bilaterally. Uvula tongue midline.  Head turning and shoulder shrug and were normal and symmetric.Tongue protrusion into cheek strength was normal.  Motor: Normal tone, bulk and strength.  Sensory: Intact to fine touch, pinprick, preserved  vibratory sensation, and proprioception at toes.  Coordination: She has profound finger to nose, heel-to-shin dysmetria   Gait: Rising up from seated position by pushing on wheelchair, ataxic, very unsteady gait    Deep tendon reflexes: Brachioradialis 2/2, biceps 2/2, triceps 2/2, patellar 2/2, Achilles 2/2, plantar responses were flexor bilaterally.   DIAGNOSTIC DATA (LABS, IMAGING, TESTING) - I reviewed patient records, labs, notes, testing and imaging myself where available.  Lab Results  Component Value Date   WBC 6.8 11/17/2013   HGB 13.1 11/17/2013   HCT 39.1 11/17/2013   MCV 84.8 11/17/2013   PLT 368 11/17/2013      Component Value Date/Time   NA 140 11/17/2013 2303   K 4.2 11/17/2013 2303   CL 104 11/17/2013 2303   CO2 24 11/17/2013 2303   GLUCOSE 101* 11/17/2013 2303   BUN 8 11/17/2013 2303   CREATININE 0.84 11/17/2013 2303   CREATININE 0.87 04/28/2012 1727   CALCIUM 9.6 11/17/2013 2303   PROT 8.3 11/17/2013 2303   ALBUMIN 3.5 11/17/2013 2303   AST 12 11/17/2013 2303   ALT 10 11/17/2013 2303   ALKPHOS 116 11/17/2013 2303   BILITOT <0.2* 11/17/2013 2303   GFRNONAA 82* 11/17/2013 2303   GFRAA >90 11/17/2013 2303   Lab Results  Component Value Date   CHOL 191 03/10/2012   HDL 52.10 03/10/2012   LDLCALC 109* 03/10/2012   TRIG 150.0*  03/10/2012   CHOLHDL 4 03/10/2012   Lab Results  Component Value Date   HGBA1C 5.6 11/16/2013   No results found for: [id number] Lab Results  Component Value Date   TSH 4.303 11/16/2013    ASSESSMENT AND PLAN  Tanis KLAIRE COURT is a 50 y.o. female with genetic confirmed Huntington's disease, progressive worsening functional status,  Keep Haldol 1 mg 3 times a day for her chorea movement  Celebrex 100 mg twice a day for knee pain Return to clinic in 4-6 months  Levert Feinstein, M.D. Ph.D.  [facility] [address], [address] [place], [place] [address] [phone]

## 2014-06-22 ENCOUNTER — Ambulatory Visit (HOSPITAL_BASED_OUTPATIENT_CLINIC_OR_DEPARTMENT_OTHER): Payer: Medicaid Other | Admitting: Internal Medicine

## 2014-06-22 DIAGNOSIS — G4733 Obstructive sleep apnea (adult) (pediatric): Secondary | ICD-10-CM

## 2014-06-22 NOTE — Sleep Study (Signed)
   NAME: Katherine Gregory DATE OF BIRTH:  February 16, 1966 MEDICAL RECORD NUMBER [id number]  LOCATION: [facility]  PHYSICIAN: Jonda Alanis Gregory  DATE OF STUDY: 06/11/2014  SLEEP STUDY TYPE: Nocturnal Polysomnogram               REFERRING PHYSICIAN: Dorrene GermanAvbuere, Edwin A, MD  INDICATION FOR STUDY: Hypersomnia with sleep apnea  EPWORTH SLEEPINESS SCORE:   17/24 HEIGHT: 5\' 10"  (177.8 cm)  WEIGHT: 210 lb (95.255 kg)    Body mass index is 30.13 kg/(m^2).  NECK SIZE: 14.5 in.  MEDICATIONS: Charted for review  SLEEP ARCHITECTURE: Total sleep time 112.5 minutes with sleep efficiency 28.7%. Stage I was 15.1%, stage II 81.3%, stage III 3.6%, REM absent. Sleep latency 63.5 minutes, awake after sleep onset 131.5 minutes, arousal index 12.8, bedtime medication: Tramadol, gabapentin, Haldol, Ambien  RESPIRATORY DATA: No scoreable respiratory events were noted. AHI 0.0 per hour  OXYGEN DATA: No snoring was noted. Oxygen desaturation to a nadir of 91% with mean oxygen saturation 94.9% on room air  CARDIAC DATA: Sinus rhythm with PACs  MOVEMENT/PARASOMNIA: No significant limb movement disturbance. The technician commented on incidents of hand spasming twice and leg spasming once while asleep  IMPRESSION/ RECOMMENDATION:   1) Significant difficulty initiating and maintaining sleep despite medications listed above. Sleep onset about 11:30 PM but then she woke spontaneously around 12:45 AM, regained sleep around 2:30 AM but was awake again by 3:30 AM with no further sleep. 2) There were no respiratory events with sleep disturbance. AHI 0.0 per hour. The normal range for adults is an AHI from 0-5 events per hour. No snoring was noted. Normal oxygenation with nadir of 91% and mean oxygen saturation 94.9% on room air.   Waymon BudgeYOUNG,[name] Diplomate, American Board of Sleep Medicine  ELECTRONICALLY SIGNED ON:  06/22/2014, 9:22 AM [facility] PH: [phone]   FX: [phone]  [phone] ACCREDITED BY THE AMERICAN ACADEMY OF SLEEP MEDICINE

## 2014-07-16 ENCOUNTER — Other Ambulatory Visit: Payer: Self-pay | Admitting: Neurology

## 2014-07-17 ENCOUNTER — Telehealth: Payer: Self-pay | Admitting: Gastroenterology

## 2014-07-17 NOTE — Telephone Encounter (Signed)
Patient with gas and bloating she is advised that she needs an office visit.  Katherine Gregory his next available is not until 09/27/14.  Will you please call her, I am not sure if he has hidden spots.  She is advised that you will call her tomorrow and discuss options.

## 2014-07-18 NOTE — Telephone Encounter (Signed)
Pt had gallbladder removed but still continues to have bloating and abd swelling.  appt with Shanda BumpsJessica for 07/19/14 Op note to be faxed

## 2014-07-19 ENCOUNTER — Ambulatory Visit: Payer: Medicaid Other | Admitting: Gastroenterology

## 2014-07-20 ENCOUNTER — Other Ambulatory Visit: Payer: Self-pay | Admitting: Neurology

## 2014-07-23 ENCOUNTER — Ambulatory Visit: Payer: Medicaid Other | Admitting: Physician Assistant

## 2014-07-23 ENCOUNTER — Telehealth: Payer: Self-pay | Admitting: Physician Assistant

## 2014-07-23 NOTE — Telephone Encounter (Signed)
NO. She called and it was emergent.

## 2014-07-23 NOTE — Telephone Encounter (Signed)
Patient canceled 1:45 PM at 12:55 pm on same day for apt due to transportation.  Advise on late cancellation fee.

## 2014-08-01 ENCOUNTER — Other Ambulatory Visit: Payer: Self-pay | Admitting: Neurology

## 2014-08-01 NOTE — Telephone Encounter (Signed)
6 refills sent on 03/14

## 2014-08-21 ENCOUNTER — Ambulatory Visit (INDEPENDENT_AMBULATORY_CARE_PROVIDER_SITE_OTHER): Payer: Medicaid Other | Admitting: Physician Assistant

## 2014-08-21 ENCOUNTER — Other Ambulatory Visit (HOSPITAL_COMMUNITY): Payer: Self-pay | Admitting: Physician Assistant

## 2014-08-21 ENCOUNTER — Other Ambulatory Visit (INDEPENDENT_AMBULATORY_CARE_PROVIDER_SITE_OTHER): Payer: Medicaid Other

## 2014-08-21 ENCOUNTER — Encounter: Payer: Self-pay | Admitting: Physician Assistant

## 2014-08-21 VITALS — BP 110/70 | HR 100 | Ht 67.75 in | Wt 243.2 lb

## 2014-08-21 DIAGNOSIS — K219 Gastro-esophageal reflux disease without esophagitis: Secondary | ICD-10-CM | POA: Diagnosis not present

## 2014-08-21 DIAGNOSIS — R6881 Early satiety: Secondary | ICD-10-CM | POA: Diagnosis not present

## 2014-08-21 DIAGNOSIS — R1310 Dysphagia, unspecified: Secondary | ICD-10-CM

## 2014-08-21 DIAGNOSIS — R140 Abdominal distension (gaseous): Secondary | ICD-10-CM

## 2014-08-21 DIAGNOSIS — K589 Irritable bowel syndrome without diarrhea: Secondary | ICD-10-CM

## 2014-08-21 LAB — COMPREHENSIVE METABOLIC PANEL
ALT: 13 U/L (ref 0–35)
AST: 14 U/L (ref 0–37)
Albumin: 3.7 g/dL (ref 3.5–5.2)
Alkaline Phosphatase: 143 U/L — ABNORMAL HIGH (ref 39–117)
BUN: 10 mg/dL (ref 6–23)
CO2: 28 mEq/L (ref 19–32)
Calcium: 9.6 mg/dL (ref 8.4–10.5)
Chloride: 103 mEq/L (ref 96–112)
Creatinine, Ser: 0.73 mg/dL (ref 0.40–1.20)
GFR: 109.63 mL/min (ref >60.00)
Glucose, Bld: 75 mg/dL (ref 70–99)
Potassium: 4 mEq/L (ref 3.5–5.1)
Sodium: 137 mEq/L (ref 135–145)
Total Bilirubin: 0.2 mg/dL (ref 0.2–1.2)
Total Protein: 7.9 g/dL (ref 6.0–8.3)

## 2014-08-21 LAB — CBC WITH DIFFERENTIAL/PLATELET
Basophils Absolute: 0 10*3/uL (ref 0.0–0.1)
Basophils Relative: 0.4 % (ref 0.0–3.0)
Eosinophils Absolute: 0.1 10*3/uL (ref 0.0–0.7)
Eosinophils Relative: 1.7 % (ref 0.0–5.0)
HCT: 36.5 % (ref 36.0–46.0)
Hemoglobin: 12.1 g/dL (ref 12.0–15.0)
Lymphocytes Relative: 33.6 % (ref 12.0–46.0)
Lymphs Abs: 2.9 10*3/uL (ref 0.7–4.0)
MCHC: 33.1 g/dL (ref 30.0–36.0)
MCV: 81.6 fl (ref 78.0–100.0)
Monocytes Absolute: 0.9 10*3/uL (ref 0.1–1.0)
Monocytes Relative: 10.5 % (ref 3.0–12.0)
Neutro Abs: 4.6 10*3/uL (ref 1.4–7.7)
Neutrophils Relative %: 53.8 % (ref 43.0–77.0)
Platelets: 356 10*3/uL (ref 150.0–400.0)
RBC: 4.48 Mil/uL (ref 3.87–5.11)
RDW: 14.5 % (ref 11.5–15.5)
WBC: 8.6 10*3/uL (ref 4.0–10.5)

## 2014-08-21 LAB — AMYLASE: Amylase: 102 U/L (ref 27–131)

## 2014-08-21 LAB — IGA: IgA: 378 mg/dL (ref 68–378)

## 2014-08-21 LAB — LIPASE: Lipase: 36 U/L (ref 11.0–59.0)

## 2014-08-21 MED ORDER — OMEPRAZOLE 20 MG PO CPDR: 20.0000 mg | DELAYED_RELEASE_CAPSULE | Freq: Two times a day (BID) | ORAL | Status: DC

## 2014-08-21 MED ORDER — METRONIDAZOLE 250 MG PO TABS: 250.0000 mg | ORAL_TABLET | Freq: Three times a day (TID) | ORAL | Status: DC

## 2014-08-21 NOTE — Patient Instructions (Addendum)
We have sent the following medications to your pharmacy for you to pick up at your convenience: Prilosec 20 mg twice daily (30 minutes before breakfast and supper) Flagyl 250 mg 1 capsule three times daily x 7 days.  Please call your gynecologist to schedule an appointment for follow up due to bloating.  Your physician has requested that you go to the basement for the following lab work before leaving today: CBC, CMET, Amylase, Lipase, IgA, Tissue transglutamase  You have been scheduled for a follow up with Dr Christella Hartigan on 10/15/14 @ 3:45 pm.  You have been scheduled for an abdominal ultrasound and gastric emptying scan at [facility] on 09/06/14 at 8:30 sm. Please arrive at least 15 minutes prior to your appointment for registration. Please make certain not to have anything to eat or drink after midnight the night before your test. Hold all stomach medications (ex: Zofran, phenergan, Reglan) 48 hours prior to your test. If you need to reschedule your appointment, please contact radiology scheduling at [phone].  You have been scheduled for a modified barium swallow on 08/27/14 at 1:00 pm. Please arrive 15 minutes prior to your test for registration. You will go to [facility] (1st Floor) for your appointment. Please refrain from eating or drinking anything 4 hours prior to your test. Should you need to cancel or reschedule your appointment, please contact [phone] [facility]) or [phone] [facility]). _____________________________________________________________________ A Modified Barium Swallow Study, or MBS, is a special x-ray that is taken to check swallowing skills. It is carried out by a Marine scientist and a Warehouse manager (SLP). During this test, yourmouth, throat, and esophagus, a muscular tube which connects your mouth to your stomach, is checked. The test will help you, your doctor, and the SLP plan what types of foods and liquids are easier for you to  swallow. The SLP will also identify positions and ways to help you swallow more easily and safely. What will happen during an MBS? You will be taken to an x-ray room and seated comfortably. You will be asked to swallow small amounts of food and liquid mixed with barium. Barium is a liquid or paste that allows images of your mouth, throat and esophagus to be seen on x-ray. The x-ray captures moving images of the food you are swallowing as it travels from your mouth through your throat and into your esophagus. This test helps identify whether food or liquid is entering your lungs (aspiration). The test also shows which part of your mouth or throat lacks strength or coordination to move the food or liquid in the right direction. This test typically takes 30 minutes to 1 hour to complete. _______________________________________________________________________  Gastroesophageal Reflux Disease, Adult Gastroesophageal reflux disease (GERD) happens when acid from your stomach flows up into the esophagus. When acid comes in contact with the esophagus, the acid causes soreness (inflammation) in the esophagus. Over time, GERD may create small holes (ulcers) in the lining of the esophagus. CAUSES   Increased body weight. This puts pressure on the stomach, making acid rise from the stomach into the esophagus.  Smoking. This increases acid production in the stomach.  Drinking alcohol. This causes decreased pressure in the lower esophageal sphincter (valve or ring of muscle between the esophagus and stomach), allowing acid from the stomach into the esophagus.  Late evening meals and a full stomach. This increases pressure and acid production in the stomach.  A malformed lower esophageal sphincter. Sometimes, no cause is found. SYMPTOMS  Burning pain in the lower part of the mid-chest behind the breastbone and in the mid-stomach area. This may occur twice a week or more often.  Trouble swallowing.  Sore  throat.  Dry cough.  Asthma-like symptoms including chest tightness, shortness of breath, or wheezing. DIAGNOSIS  Your caregiver may be able to diagnose GERD based on your symptoms. In some cases, X-rays and other tests may be done to check for complications or to check the condition of your stomach and esophagus. TREATMENT  Your caregiver may recommend over-the-counter or prescription medicines to help decrease acid production. Ask your caregiver before starting or adding any new medicines.  HOME CARE INSTRUCTIONS   Change the factors that you can control. Ask your caregiver for guidance concerning weight loss, quitting smoking, and alcohol consumption.  Avoid foods and drinks that make your symptoms worse, such as:  Caffeine or alcoholic drinks.  Chocolate.  Peppermint or mint flavorings.  Garlic and onions.  Spicy foods.  Citrus fruits, such as oranges, lemons, or limes.  Tomato-based foods such as sauce, chili, salsa, and pizza.  Fried and fatty foods.  Avoid lying down for the 3 hours prior to your bedtime or prior to taking a nap.  Eat small, frequent meals instead of large meals.  Wear loose-fitting clothing. Do not wear anything tight around your waist that causes pressure on your stomach.  Raise the head of your bed 6 to 8 inches with wood blocks to help you sleep. Extra pillows will not help.  Only take over-the-counter or prescription medicines for pain, discomfort, or fever as directed by your caregiver.  Do not take aspirin, ibuprofen, or other nonsteroidal anti-inflammatory drugs (NSAIDs). SEEK IMMEDIATE MEDICAL CARE IF:   You have pain in your arms, neck, jaw, teeth, or back.  Your pain increases or changes in intensity or duration.  You develop nausea, vomiting, or sweating (diaphoresis).  You develop shortness of breath, or you faint.  Your vomit is green, yellow, black, or looks like coffee grounds or blood.  Your stool is red, bloody, or  black. These symptoms could be signs of other problems, such as heart disease, gastric bleeding, or esophageal bleeding. MAKE SURE YOU:   Understand these instructions.  Will watch your condition.  Will get help right away if you are not doing well or get worse. Document Released: 02/03/2005 Document Revised: 07/19/2011 Document Reviewed: 11/13/2010 [facility] Patient Information 2015 [place], [place]. This information is not intended to replace advice given to you by your health care provider. Make sure you discuss any questions you have with your health care provider.  Food Choices for Gastroesophageal Reflux Disease When you have gastroesophageal reflux disease (GERD), the foods you eat and your eating habits are very important. Choosing the right foods can help ease the discomfort of GERD. WHAT GENERAL GUIDELINES DO I NEED TO FOLLOW?  Choose fruits, vegetables, whole grains, low-fat dairy products, and low-fat meat, fish, and poultry.  Limit fats such as oils, salad dressings, butter, nuts, and avocado.  Keep a food diary to identify foods that cause symptoms.  Avoid foods that cause reflux. These may be different for different people.  Eat frequent small meals instead of three large meals each day.  Eat your meals slowly, in a relaxed setting.  Limit fried foods.  Cook foods using methods other than frying.  Avoid drinking alcohol.  Avoid drinking large amounts of liquids with your meals.  Avoid bending over or lying down until 2-3 hours after eating. WHAT  FOODS ARE NOT RECOMMENDED? The following are some foods and drinks that may worsen your symptoms: Vegetables Tomatoes. Tomato juice. Tomato and spaghetti sauce. Chili peppers. Onion and garlic. Horseradish. Fruits Oranges, grapefruit, and lemon (fruit and juice). Meats High-fat meats, fish, and poultry. This includes hot dogs, ribs, ham, sausage, salami, and bacon. Dairy Whole milk and chocolate milk. Sour cream.  Cream. Butter. Ice cream. Cream cheese.  Beverages Coffee and tea, with or without caffeine. Carbonated beverages or energy drinks. Condiments Hot sauce. Barbecue sauce.  Sweets/Desserts Chocolate and cocoa. Donuts. Peppermint and spearmint. Fats and Oils High-fat foods, including [place] fries and potato chips. Other Vinegar. Strong spices, such as black pepper, white pepper, red pepper, cayenne, curry powder, cloves, ginger, and chili powder. The items listed above may not be a complete list of foods and beverages to avoid. Contact your dietitian for more information. Document Released: 04/26/2005 Document Revised: 05/01/2013 Document Reviewed: 02/28/2013 [facility] Patient Information 2015 [place]. This information is not intended to replace advice given to you by your health care provider. Make sure you discuss any questions you have with your health care provider.

## 2014-08-22 ENCOUNTER — Encounter: Payer: Self-pay | Admitting: Physician Assistant

## 2014-08-22 LAB — TISSUE TRANSGLUTAMINASE, IGA: Tissue Transglutaminase Ab, IgA: 1 U/mL (ref <4)

## 2014-08-22 NOTE — Progress Notes (Signed)
Patient ID: Katherine Gregory, female   DOB: 1965/06/09, 49 y.o.   MRN: [id number]     History of Present Illness: Katherine Gregory is a 49 year old female known to Dr. Christella Hartigan. Seen in 2015 with epigastric pain. She had a CT scan and ultrasound which was normal but then had a hiatus scan which revealed an ejection fraction of 12%. LFTs were normal. She was referred to [facility] surgery and subsequently underwent a laparoscopic cholecystectomy on 02/22/2014. She is here today and says for years she has been bloated but feels it is much worse now. Since her gallbladder surgery she has pain in the epigastric area. She is nauseous and her nausea is worse after meals. She feels full sooner than normal and feels the food does not move out of her stomach. She has been having foul-smelling burps he gets a lot of heartburn. She is on Prilosec for the past month and feels it has provided some relief. She has been experiencing dysphagia to meats, dry foods and pills and has to drink water to push it down. She often feels as if she is getting choked and feels her food goes down the wrong pipe and she sputters and coughs a lot. Her mouth is very dry. She has episodes of right upper quadrant and epigastric pain postprandially that "feels just like my gallbladder pain" she has been very bloated and sees her GYN yearly. Her last menstrual period was in early March but she says her menses have been alternating between spotting and heavy for the past few months and have been very irregular. Patient says she has a history of IBS and has been skipping days between bowel movements and then passing hard nugget like stools.  Patient's history is significant for Huntington's disease, fibromyalgia, goiter, acid reflux, palpitations, vertigo, and depression. He is followed by Dr. Susanne Borders of neurology and has been noted to have progressive worsening functional status from her Huntington's. 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   Past Medical History  Diagnosis Date  . Acid reflux   . Seasonal allergies   . Cellulitis   . GOITER   . URTICARIA   . FATIGUE   . Palpitations   . Dysuria   . FLANK PAIN, RIGHT   . HEADACHES, HX OF   . Fibromyalgia   . Bleeding gastric ulcer   . Cervical back pain with evidence of disc disease   . Vitamin D deficiency   . IBS (irritable bowel syndrome)   . Vertigo   . Depression   . Post partum depression   . Huntington's disease     Past Surgical History  Procedure Laterality Date  . Cesarean section  1988 & 2008    x 2  . Wisdom tooth extraction    . Cartilage surgery      RT wrist  . Cardiac catheterization      05/17/12 / waiting on results   Family History  Problem Relation Age of Onset  . Hypertension Mother   . Huntington's disease Mother     deceased Aug 22, 2022  . Diabetes Father   . Hypertension Father   . Heart disease Father   . Scoliosis Sister   . Huntington's disease Sister   . Diabetes Brother   . Hypertension Brother   . Scoliosis Brother   . Skin cancer Maternal Grandmother    History  Substance Use Topics  . Smoking status: Never Smoker   . Smokeless  tobacco: Never Used  . Alcohol Use: 0.6 oz/week    1 Glasses of wine per week     Comment: occassional   Current Outpatient Prescriptions  Medication Sig Dispense Refill  . amitriptyline (ELAVIL) 50 MG tablet Take 50 mg by mouth at bedtime.    . celecoxib (CELEBREX) 100 MG capsule Take 1 capsule (100 mg total) by mouth 2 (two) times daily. 60 capsule 11  . diazepam (VALIUM) 2 MG tablet Take 2 mg by mouth every 6 (six) hours as needed for anxiety (ANXIETY).    [name] gabapentin (NEURONTIN) 300 MG capsule Take 900 mg by mouth 3 (three) times daily.    . haloperidol (HALDOL) 1 MG tablet Take 1 tablet (1 mg total) by mouth 3 (three) times daily. 90 tablet 3  . meclizine (ANTIVERT) 25 MG tablet Take 25 mg by mouth 3 (three) times daily as needed for dizziness (DIZZINESS).    .  ondansetron (ZOFRAN) 4 MG tablet Take 4 mg by mouth every 6 (six) hours as needed for nausea or vomiting.    . sertraline (ZOLOFT) 50 MG tablet Take 25 mg by mouth daily. Take 1/2 tablet x 2 weeks then take 1 tablet daily    . traMADol (ULTRAM) 50 MG tablet TAKE 1 TO 2 TABLETS BY MOUTH EVERY 12 HOURS AS NEEDED FOR MODERATE PAIN 60 tablet 5  . Vitamin D, Ergocalciferol, (DRISDOL) 50000 UNITS CAPS capsule Take 50,000 Units by mouth every 7 (seven) days. MONDAY    . metroNIDAZOLE (FLAGYL) 250 MG tablet Take 1 tablet (250 mg total) by mouth 3 (three) times daily. 21 tablet 0  . omeprazole (PRILOSEC) 20 MG capsule Take 1 capsule (20 mg total) by mouth 2 (two) times daily. 30 minutes before breakfast and 30 minutes before supper 60 capsule 3   No current facility-administered medications for this visit.   Allergies  Allergen Reactions  . Ibuprofen Other (See Comments)    "It intensifies my pain." Made pain and symptoms worse. "It intensifies my pain."  . Morphine Itching    Red blotches on skin and burning.  . Morphine And Related Itching and Other (See Comments)    Reaction burns Reaction burns  . Penicillin G Itching  . Penicillins Hives and Itching  . Adhesive [Tape] Itching    Itching and burning at site Itching and burning at site  . Pseudoephedrine Other (See Comments) and Palpitations    Speed up heart rate Speed up heart rate      Review of Systems: Per history of present illness, otherwise negative.  Physical Exam: General: Pleasant, well developed female in no acute distress Head: Normocephalic and atraumatic Eyes:  sclerae anicteric, conjunctiva pink  Ears: Normal auditory acuity Lungs: Clear throughout to auscultation Heart: Regular rate and rhythm Abdomen: Soft, non distended, non-tender. No masses, no hepatomegaly. Normal bowel sounds Musculoskeletal: Symmetrical with no gross deformities  Extremities: No edema  Neurological: Alert oriented x 4, grossly  nonfocal Psychological:  Alert and cooperative. Normal mood and affect  Assessment and Recommendations: 49 year old female with a history of Huntington's and IBS presenting with dysphagia, GERD, early satiety, bloating, and erratic bowel movements. An antireflux regimen has been reviewed. She will increase her Prilosec to 20 mg twice a day, 30 minutes prior to breakfast and 30 minutes prior to supper. Due to her complaints of postprandial epigastric and right upper quadrant pain and abdominal ultrasound will be obtained to evaluate for ductal dilatation, cholelithiasis, or stricture. A CBC, comprehensive metabolic panel, amylase,  and lipase will be obtained. As she has been experiencing early satiety and prolonged sensation of fullness a gastric emptying scan will be obtained. Due to her complaints of dysphagia and the sensation that food "wants to go down the wrong pipe, causing her to cough and sputtering with meals, she will be scheduled for a modified barium swallow with speech pathology to evaluate for possible transfer dysphagia in a patient with Huntington's. Patient has been instructed to contact her GYN M.D. to follow-up for further evaluation her for bloating, in the meantime she's been having excess gas and erratic bowel movements she will be given an empiric trial of Flagyl 250 mg by mouth 3 times a day for 7 days for possible bacterial overgrowth. She will follow up with Dr. Christella HartiganJacobs in approximately 6 weeks.        Kishan Wachsmuth, Moise BoringLori P PA-C 08/22/2014,

## 2014-08-23 ENCOUNTER — Other Ambulatory Visit (HOSPITAL_COMMUNITY): Payer: Self-pay | Admitting: Physician Assistant

## 2014-08-23 DIAGNOSIS — R1314 Dysphagia, pharyngoesophageal phase: Secondary | ICD-10-CM

## 2014-08-23 NOTE — Progress Notes (Signed)
i agree with the above note, plan 

## 2014-08-27 ENCOUNTER — Ambulatory Visit (HOSPITAL_COMMUNITY)
Admission: RE | Admit: 2014-08-27 | Discharge: 2014-08-27 | Disposition: A | Discharge status: Home / Self Care | Payer: Medicaid Other | Source: Ambulatory Visit | Attending: Physician Assistant | Admitting: Physician Assistant

## 2014-08-27 DIAGNOSIS — R140 Abdominal distension (gaseous): Secondary | ICD-10-CM | POA: Diagnosis not present

## 2014-08-27 DIAGNOSIS — R1310 Dysphagia, unspecified: Secondary | ICD-10-CM | POA: Insufficient documentation

## 2014-08-27 DIAGNOSIS — R6881 Early satiety: Secondary | ICD-10-CM | POA: Diagnosis not present

## 2014-08-27 DIAGNOSIS — K219 Gastro-esophageal reflux disease without esophagitis: Secondary | ICD-10-CM

## 2014-08-27 DIAGNOSIS — K589 Irritable bowel syndrome without diarrhea: Secondary | ICD-10-CM | POA: Insufficient documentation

## 2014-08-27 DIAGNOSIS — R1314 Dysphagia, pharyngoesophageal phase: Secondary | ICD-10-CM

## 2014-08-27 NOTE — Progress Notes (Signed)
Objective Swallowing Evaluation: Modified Barium Swallowing Study  Patient Details  Name: Katherine Gregory MRN: [id number] Date of Birth: 10-01-1965  Today's Date: 08/27/2014 Time: SLP Start Time (ACUTE ONLY): 1330-SLP Stop Time (ACUTE ONLY): 1349 SLP Time Calculation (min) (ACUTE ONLY): 19 min  Past Medical History:  Past Medical History  Diagnosis Date  . Acid reflux   . Seasonal allergies   . Cellulitis   . GOITER   . URTICARIA   . FATIGUE   . Palpitations   . Dysuria   . FLANK PAIN, RIGHT   . HEADACHES, HX OF   . Fibromyalgia   . Bleeding gastric ulcer   . Cervical back pain with evidence of disc disease   . Vitamin D deficiency   . IBS (irritable bowel syndrome)   . Vertigo   . Depression   . Post partum depression   . Huntington's disease    Past Surgical History:  Past Surgical History  Procedure Laterality Date  . Cesarean section  1988 & 2008    x 2  . Wisdom tooth extraction    . Cartilage surgery      RT wrist  . Cardiac catheterization      05/17/12 / waiting on results   HPI:  HPI: 49 yo referred for OP MBS due to pt report of occasionally coughing/choking with intake.  Pt recently diagnosed with Huntington's disease but states she had symptoms for approximately 2 years.  PMH also + for acid reflux, gallbladder removal November 2015 with pt reporting abdomen distention.  Previously pt had endoscopy completed with diagnosis of reflux subseqeuently placed on Prilosec.  Pt denies weight loss, recent pnas nor requiring heimlich manuever.  She does admit to issues with dry mouth.    No Data Recorded  Assessment / Plan / Recommendation CHL IP CLINICAL IMPRESSIONS 08/27/2014  Dysphagia Diagnosis WFL  Clinical impression Pt presents with overall functional swallow - no aspiration/penetration of any consistency tested noted.  Pt with minimal premature spillage of nectar barium to vallecular space x1 prior to swallow intiation indicating a minimal delay.  Overall  swallow was strong without residuals.  Pt did produce very subtle cough response when swallowing barium tablet with thin - barium tablet with minimal pausing at cervical esophageal region (appeared below UES) likely prompting reflexive cough response.  There was no backflow into pharynx but pt did have minimal amount of liquid residuals in proxmal esophageal.  Pt did continue to report sensation of globus after barium tablet cleared.  Using live video, provided pt with feedback and compensation strategies. SLP also provided pt with verbal and written xerostomia tips.        CHL IP TREATMENT RECOMMENDATION 08/27/2014  Treatment Plan Recommendations No treatment recommended at this time     CHL IP DIET RECOMMENDATION 08/27/2014  Diet Recommendations Regular;Thin liquid  Liquid Administration via Cup;Straw  Medication Administration Whole meds with liquid  Compensations Slow rate;Small sips/bites  Postural Changes and/or Swallow Maneuvers Seated upright 90 degrees     CHL IP OTHER RECOMMENDATIONS 08/27/2014  Recommended Consults (None)  Oral Care Recommendations Oral care BID  Other Recommendations (None)     CHL IP FOLLOW UP RECOMMENDATIONS 08/27/2014  Follow up Recommendations None         CHL IP REASON FOR REFERRAL 08/27/2014  Reason for Referral Objectively evaluate swallowing function     CHL IP ORAL PHASE 08/27/2014  Lips (None)  Tongue (None)  Mucous membranes (None)  Nutritional status (None)  Other (None)  Oxygen therapy (None)  Oral Phase (None)  Oral - Pudding Teaspoon (None)  Oral - Pudding Cup (None)  Oral - Honey Teaspoon (None)  Oral - Honey Cup (None)  Oral - Honey Syringe (None)  Oral - Nectar Teaspoon (None)  Oral - Nectar Cup Other (Comment) premature spillage of barium to vallecular space  Oral - Nectar Straw (None)  Oral - Nectar Syringe (None)  Oral - Ice Chips (None)  Oral - Thin Teaspoon (None)  Oral - Thin Cup WFL  Oral - Thin Straw WFL  Oral - Thin  Syringe (None)  Oral - Puree WFL  Oral - Mechanical Soft (None)  Oral - Regular WFL  Oral - Multi-consistency (None)  Oral - Pill WFL  Oral Phase - Comment (None)      CHL IP PHARYNGEAL PHASE 08/27/2014  Pharyngeal Phase WFL  Pharyngeal - Pudding Teaspoon (None)  Penetration/Aspiration details (pudding teaspoon) (None)  Pharyngeal - Pudding Cup (None)  Penetration/Aspiration details (pudding cup) (None)  Pharyngeal - Honey Teaspoon (None)  Penetration/Aspiration details (honey teaspoon) (None)  Pharyngeal - Honey Cup (None)  Penetration/Aspiration details (honey cup) (None)  Pharyngeal - Honey Syringe (None)  Penetration/Aspiration details (honey syringe) (None)  Pharyngeal - Nectar Teaspoon (None)  Penetration/Aspiration details (nectar teaspoon) (None)  Pharyngeal - Nectar Cup Delayed swallow initiation;Premature spillage to valleculae  Penetration/Aspiration details (nectar cup) (None)  Pharyngeal - Nectar Straw (None)  Penetration/Aspiration details (nectar straw) (None)  Pharyngeal - Nectar Syringe (None)  Penetration/Aspiration details (nectar syringe) (None)  Pharyngeal - Ice Chips (None)  Penetration/Aspiration details (ice chips) (None)  Pharyngeal - Thin Teaspoon (None)  Penetration/Aspiration details (thin teaspoon) (None)  Pharyngeal - Thin Cup [facility]  Penetration/Aspiration details (thin cup) (None)  Pharyngeal - Thin Straw WFL  Penetration/Aspiration details (thin straw) (None)  Pharyngeal - Thin Syringe (None)  Penetration/Aspiration details (thin syringe') (None)  Pharyngeal - Puree WFL;Premature spillage to valleculae  Penetration/Aspiration details (puree) (None)  Pharyngeal - Mechanical Soft (None)  Penetration/Aspiration details (mechanical soft) (None)  Pharyngeal - Regular WFL;Premature spillage to valleculae  Penetration/Aspiration details (regular) (None)  Pharyngeal - Multi-consistency (None)  Penetration/Aspiration details (multi-consistency) (None)   Pharyngeal - Pill WFL  Penetration/Aspiration details (pill) (None)  Pharyngeal Comment (None)     CHL IP CERVICAL ESOPHAGEAL PHASE 08/27/2014  Cervical Esophageal Phase WFL  Pudding Teaspoon (None)  Pudding Cup (None)  Honey Teaspoon (None)  Honey Cup (None)  Honey Syringe (None)  Nectar Teaspoon (None)  Nectar Cup (None)  Nectar Straw (None)  Nectar Syringe (None)  Thin Teaspoon (None)  Thin Cup (None)  Thin Straw (None)  Thin Syringe (None)  Cervical Esophageal Comment (None)    CHL IP GO 08/27/2014  Functional Assessment Tool Used mbs, clinical judgement  Functional Limitations Swallowing  Swallow Current Status (Z3086) CI  Swallow Goal Status (V7846) CI  Swallow Discharge Status (N6295) CI           Donavan Burnet, MS [facility] SLP [phone]

## 2014-09-06 ENCOUNTER — Encounter (HOSPITAL_COMMUNITY)
Admission: RE | Admit: 2014-09-06 | Discharge: 2014-09-06 | Disposition: A | Discharge status: Home / Self Care | Payer: Medicaid Other | Source: Ambulatory Visit | Attending: Physician Assistant | Admitting: Physician Assistant

## 2014-09-06 ENCOUNTER — Encounter: Payer: Self-pay | Admitting: *Deleted

## 2014-09-06 ENCOUNTER — Ambulatory Visit (HOSPITAL_COMMUNITY)
Admission: RE | Admit: 2014-09-06 | Discharge: 2014-09-06 | Disposition: A | Discharge status: Home / Self Care | Payer: Medicaid Other | Source: Ambulatory Visit | Attending: Physician Assistant | Admitting: Physician Assistant

## 2014-09-06 DIAGNOSIS — R1310 Dysphagia, unspecified: Secondary | ICD-10-CM | POA: Insufficient documentation

## 2014-09-06 DIAGNOSIS — R1013 Epigastric pain: Secondary | ICD-10-CM | POA: Insufficient documentation

## 2014-09-06 DIAGNOSIS — R6881 Early satiety: Secondary | ICD-10-CM

## 2014-09-06 DIAGNOSIS — R1011 Right upper quadrant pain: Secondary | ICD-10-CM | POA: Insufficient documentation

## 2014-09-06 DIAGNOSIS — K219 Gastro-esophageal reflux disease without esophagitis: Secondary | ICD-10-CM

## 2014-09-06 DIAGNOSIS — K589 Irritable bowel syndrome without diarrhea: Secondary | ICD-10-CM

## 2014-09-06 DIAGNOSIS — R140 Abdominal distension (gaseous): Secondary | ICD-10-CM

## 2014-09-06 DIAGNOSIS — Z9049 Acquired absence of other specified parts of digestive tract: Secondary | ICD-10-CM | POA: Insufficient documentation

## 2014-09-06 MED ORDER — TECHNETIUM TC 99M SULFUR COLLOID
2.2000 | Freq: Once | INTRAVENOUS | Status: AC | PRN
  Administered 2014-09-06: 2.2 via ORAL

## 2014-09-09 ENCOUNTER — Encounter: Payer: Self-pay | Admitting: *Deleted

## 2014-09-24 ENCOUNTER — Telehealth: Payer: Self-pay | Admitting: Physician Assistant

## 2014-09-24 NOTE — Telephone Encounter (Signed)
Patient given results

## 2014-10-02 ENCOUNTER — Ambulatory Visit: Payer: Medicaid Other | Admitting: Certified Nurse Midwife

## 2014-10-15 ENCOUNTER — Telehealth: Payer: Self-pay | Admitting: Gastroenterology

## 2014-10-15 ENCOUNTER — Ambulatory Visit: Payer: Medicaid Other | Admitting: Gastroenterology

## 2014-10-15 NOTE — Telephone Encounter (Addendum)
Dr. Christella HartiganJacobs,  Patient called at 3:30 to cancel 3:45 apt today due to transportation.  This will be the third same day cancellation for this patient.  Advise for late cancellation fee and approval for reschedule.

## 2014-10-15 NOTE — Telephone Encounter (Signed)
No need for billing her.  OK to reschedule one more time.  Thanks

## 2014-10-18 ENCOUNTER — Ambulatory Visit: Payer: Medicaid Other | Admitting: Neurology

## 2014-10-21 DIAGNOSIS — Z0289 Encounter for other administrative examinations: Secondary | ICD-10-CM

## 2014-10-22 ENCOUNTER — Ambulatory Visit (INDEPENDENT_AMBULATORY_CARE_PROVIDER_SITE_OTHER): Payer: Medicaid Other | Admitting: Neurology

## 2014-10-22 ENCOUNTER — Encounter: Payer: Self-pay | Admitting: Neurology

## 2014-10-22 VITALS — BP 125/90 | HR 93 | Ht 67.5 in | Wt 248.5 lb

## 2014-10-22 DIAGNOSIS — R42 Dizziness and giddiness: Secondary | ICD-10-CM

## 2014-10-22 DIAGNOSIS — G10 Huntington's disease: Secondary | ICD-10-CM

## 2014-10-22 MED ORDER — HALOPERIDOL 1 MG PO TABS: 1.0000 mg | ORAL_TABLET | Freq: Three times a day (TID) | ORAL | Status: DC

## 2014-10-22 NOTE — Progress Notes (Signed)
Chief Complaint  Patient presents with  . Huntington's Disease    Feels her dyskinesia has improved with the addition of Haldol.  She is still having some gait difficulty but is able to walk unassisted.       PATIENT: Katherine Gregory DOB: Nov 06, 1965  HISTORICAL  Katherine Gregory  is a 49 years old left-handed African-American female, referred by her primary care physician Dr. Samantha Crimes for Hungtingons diseaes.   She was diagnosed with Huntington's disease by [facility] neurologist Dr. Boris Sharper in November 2014, based on her symptoms, positive family history, and genetic testing,  Her mother suffered Huntington's disease, diagnosis was made at age 619, passed away at age 53 in 03/16/2015her younger sister at age 67 was also diagnosed with Huntington's disease, her mother has 7 children, 4 girls, 3 boys.  Katherine Gregory lives with her sister, she has a daughter at age 37, son age39, grand daughter age 61   She become symptomatic around 2013, she noticed extreme fatigue, eventually has to quit her job because of fatigue, initially she was diagnosed with fibromyalgia, later she developed chorea movement, gait difficulty, recent few weeks, she also developed uncontrollable neck jerking movement, which has been very bothersome,  She is followed by Dr. [name] at yearly basis, hope to follow-up with a local neurologist to address her needs.  The most bothersome symptoms for her is chorea movement , especially her abnormal neck jerking movement, gait difficulty, she also dealing with depression, Zoloft has been very helpful.  UPDATE Feb 11 th 2016: Hadol  tid has been very helpful, she has much less neck jerking movement, she continued to complains of extreme fatigue, bilateral knee pain, worsening gait difficulty, she also has episode of difficulty swallowing, bowel incontinence, Is in the process of applying for disability  UPDATE October 22 2014: She is taking Hadol  tid, which has been helpful,   She often see things moving, white spots at her peripheral visual field. She can ambulate without assistant, but getting worse gradually, she has regular bowel movement, still wait on disability, she will follow up with Dr. [name] again. She lives with her sister, who is 5 years younger than she is, who has no signs of Huntington's disease.  Celebrex has been helpful for her knee pain  REVIEW OF SYSTEMS: Full 14 system review of systems performed and notable only for fatigue, shortness of breath, neck swelling, rectal pain, heat intolerance, excessive thirst, eating, joint pain, walking difficulty, dizziness, weakness, depression hallucinations  ALLERGIES: Allergies  Allergen Reactions  . Ibuprofen Other (See Comments)    "It intensifies my pain." Made pain and symptoms worse. "It intensifies my pain."  . Morphine Itching    Red blotches on skin and burning.  . Morphine And Related Itching and Other (See Comments)    Reaction burns Reaction burns  . Penicillin G Itching  . Penicillins Hives and Itching  . Adhesive [Tape] Itching    Itching and burning at site Itching and burning at site  . Pseudoephedrine Other (See Comments) and Palpitations    Speed up heart rate Speed up heart rate    HOME MEDICATIONS: Current Outpatient Prescriptions on File Prior to Visit  Medication Sig Dispense Refill  . amitriptyline (ELAVIL) 50 MG tablet Take 50 mg by mouth at bedtime.    . celecoxib (CELEBREX) 100 MG capsule Take 1 capsule (100 mg total) by mouth 2 (two) times daily. 60 capsule 11  . diazepam (VALIUM) 2 MG tablet Take  2 mg by mouth every 6 (six) hours as needed for anxiety (ANXIETY).    [name] gabapentin (NEURONTIN) 300 MG capsule Take 900 mg by mouth 3 (three) times daily.    . haloperidol (HALDOL) 1 MG tablet Take 1 tablet (1 mg total) by mouth 3 (three) times daily. 90 tablet 3  . meclizine (ANTIVERT) 25 MG tablet Take 25 mg by mouth 3 (three) times daily as needed for dizziness  (DIZZINESS).    [name] omeprazole (PRILOSEC) 20 MG capsule Take 1 capsule (20 mg total) by mouth 2 (two) times daily. 30 minutes before breakfast and 30 minutes before supper 60 capsule 3  . ondansetron (ZOFRAN) 4 MG tablet Take 4 mg by mouth every 6 (six) hours as needed for nausea or vomiting.    . sertraline (ZOLOFT) 50 MG tablet Take 25 mg by mouth daily. Take 1/2 tablet x 2 weeks then take 1 tablet daily    . traMADol (ULTRAM) 50 MG tablet TAKE 1 TO 2 TABLETS BY MOUTH EVERY 12 HOURS AS NEEDED FOR MODERATE PAIN 60 tablet 5  . Vitamin D, Ergocalciferol, (DRISDOL) 50000 UNITS CAPS capsule Take 50,000 Units by mouth every 7 (seven) days. MONDAY     No current facility-administered medications on file prior to visit.    PAST MEDICAL HISTORY: Past Medical History  Diagnosis Date  . Acid reflux   . Seasonal allergies   . Cellulitis   . GOITER   . URTICARIA   . FATIGUE   . Palpitations   . Dysuria   . FLANK PAIN, RIGHT   . HEADACHES, HX OF   . Fibromyalgia   . Bleeding gastric ulcer   . Cervical back pain with evidence of disc disease   . Vitamin D deficiency   . IBS (irritable bowel syndrome)   . Vertigo   . Depression   . Post partum depression   . Huntington's disease     PAST SURGICAL HISTORY: Past Surgical History  Procedure Laterality Date  . Cesarean section  1988 & 2008    x 2  . Wisdom tooth extraction    . Cartilage surgery      RT wrist  . Cardiac catheterization      FAMILY HISTORY: Family History  Problem Relation Age of Onset  . Hypertension Mother   . Huntington's disease Mother     deceased August 07, 2022  . Diabetes Father   . Hypertension Father   . Heart disease Father   . Scoliosis Sister   . Huntington's disease Sister   . Diabetes Brother   . Hypertension Brother   . Scoliosis Brother   . Skin cancer Maternal Grandmother     SOCIAL HISTORY:  History   Social History  . Marital Status: Single    Spouse Name: N/A  . Number of Children: 2  .  Years of Education: N/A   Occupational History  . print tech    Social History Main Topics  . Smoking status: Never Smoker   . Smokeless tobacco: Never Used  . Alcohol Use: 0.6 oz/week    1 Glasses of wine per week     Comment: occassional  . Drug Use: No  . Sexual Activity: Not Currently    Birth Control/ Protection: Surgical     Comment: Tubal Ligation    Other Topics Concern  . Not on file   Social History Narrative   Patient lives at home with a family member. Patient is single.   Disabled.  Education some college.   Left handed.   Caffeine some times not daily.     PHYSICAL EXAM   Filed Vitals:   10/22/14 0930  BP: 125/90  Pulse: 93  Height: 5' 7.5" (1.715 m)  Weight: 248 lb 8 oz (112.719 kg)    Not recorded      Body mass index is 38.32 kg/(m^2).  PHYSICAL EXAMNIATION:  Gen: NAD, conversant, well nourised, obese, well groomed                     Cardiovascular: Regular rate rhythm, no peripheral edema, warm, nontender. Eyes: Conjunctivae clear without exudates or hemorrhage Neck: Supple, no carotid bruise. Pulmonary: Clear to auscultation bilaterally   NEUROLOGICAL EXAM:  MENTAL STATUS: Speech:    Speech is normal; fluent and spontaneous with normal comprehension.  Cognition:    The patient is oriented to person, place, and time;     recent and remote memory intact;     language fluent;     normal attention, concentration,     fund of knowledge.  CRANIAL NERVES: CN II: Visual fields are full to confrontation. Pupil were equal round reactive to light. CN III, IV, VI: extraocular movement are normal. No ptosis. CN V: Facial sensation is intact to pinprick in all 3 divisions bilaterally. Corneal responses are intact.  CN VII: Face is symmetric with normal eye closure and smile. CN VIII: Hearing is normal to rubbing fingers CN IX, X: Palate elevates symmetrically. Phonation is normal. CN XI: Head turning and shoulder shrug are intact CN XII:  Tongue is midline with normal movements and no atrophy.  MOTOR: There is no pronator drift of out-stretched arms. Muscle bulk and tone are normal. Muscle strength is normal.  REFLEXES: Reflexes are 2+ and symmetric at the biceps, triceps, knees, and ankles. Plantar responses are flexor.  SENSORY: Light touch, pinprick are intact in fingers and toes.  COORDINATION: Rapid alternating movements and fine finger movements are intact. There is no dysmetria on finger-to-nose and heel-knee-shin. There are no abnormal or extraneous movements.   GAIT/STANCE: Need to push up from seated position, wide based, cautious, unsteady     DIAGNOSTIC DATA (LABS, IMAGING, TESTING) - I reviewed patient records, labs, notes, testing and imaging myself where available.  Lab Results  Component Value Date   WBC 8.6 08/21/2014   HGB 12.1 08/21/2014   HCT 36.5 08/21/2014   MCV 81.6 08/21/2014   PLT 356.0 08/21/2014      Component Value Date/Time   NA 137 08/21/2014 1143   NA 139 05/25/2014 1902   K 4.0 08/21/2014 1143   K 3.8 05/25/2014 1902   CL 103 08/21/2014 1143   CL 105 05/25/2014 1902   CO2 28 08/21/2014 1143   CO2 27 05/25/2014 1902   GLUCOSE 75 08/21/2014 1143   GLUCOSE 88 05/25/2014 1902   BUN 10 08/21/2014 1143   BUN 9 05/25/2014 1902   CREATININE 0.73 08/21/2014 1143   CREATININE 0.91 05/25/2014 1902   CREATININE 0.87 04/28/2012 1727   CALCIUM 9.6 08/21/2014 1143   CALCIUM 9.2 05/25/2014 1902   PROT 7.9 08/21/2014 1143   PROT 8.0 05/25/2014 1902   ALBUMIN 3.7 08/21/2014 1143   ALBUMIN 3.3* 05/25/2014 1902   AST 14 08/21/2014 1143   AST 29 05/25/2014 1902   ALT 13 08/21/2014 1143   ALT 24 05/25/2014 1902   ALKPHOS 143* 08/21/2014 1143   ALKPHOS 132* 05/25/2014 1902   BILITOT 0.2 08/21/2014 1143  GFRNONAA 82* 11/17/2013 2303   GFRNONAA >60 05/24/2013 2150   GFRAA >90 11/17/2013 2303   GFRAA >60 05/24/2013 2150   Lab Results  Component Value Date   CHOL 191 03/10/2012     HDL 52.10 03/10/2012   LDLCALC 109* 03/10/2012   TRIG 150.0* 03/10/2012   CHOLHDL 4 03/10/2012   Lab Results  Component Value Date   HGBA1C 5.6 11/16/2013   No results found for: [id number] Lab Results  Component Value Date   TSH 4.303 11/16/2013    ASSESSMENT AND PLAN  Evania TYREKA HENNEKE is a 49 y.o. female with genetic confirmed Huntington's disease, strong family history, progressive worsening functional status,  Keep Haldol 1 mg 3 times a day for her chorea movement  Celebrex 100 mg twice a day when necessary for knee pain Return to clinic in 6 months  Katherine Gregory, M.D. Ph.D.  [facility] [address], [address] [place], [place] [address] [phone]

## 2014-11-07 ENCOUNTER — Telehealth: Payer: Self-pay | Admitting: Gastroenterology

## 2014-11-07 NOTE — Telephone Encounter (Signed)
Pt has "tight" feeling in her upper abdomen.  Had gallbladder removed Oct 2015. She states she has the lump around the area of the incision. No other symptoms and the tight feeling began a few weeks after surgery.  Pt was advised to call the surgeon and see if they want to evaluate her and if they want her evaluated by GI we can schedule with extender. Pt agreed.

## 2014-11-26 ENCOUNTER — Ambulatory Visit: Payer: Medicaid Other | Admitting: Certified Nurse Midwife

## 2014-12-16 ENCOUNTER — Telehealth: Payer: Self-pay | Admitting: Gastroenterology

## 2014-12-16 NOTE — Telephone Encounter (Signed)
Please advise this is the 4th same day cancellation.

## 2014-12-16 NOTE — Telephone Encounter (Signed)
Looks like her appt was actually on Wednesday not today.  Please offer her another appt if interested but let her know that if she has another last minute cancellation that we will not be able to continue taking care of her.

## 2014-12-18 ENCOUNTER — Ambulatory Visit: Payer: Medicaid Other | Admitting: Gastroenterology

## 2014-12-19 ENCOUNTER — Ambulatory Visit (INDEPENDENT_AMBULATORY_CARE_PROVIDER_SITE_OTHER): Payer: Medicaid Other | Admitting: Certified Nurse Midwife

## 2014-12-19 ENCOUNTER — Encounter: Payer: Self-pay | Admitting: Certified Nurse Midwife

## 2014-12-19 VITALS — BP 123/80 | HR 108 | Temp 98.5°F | Ht 70.0 in | Wt 256.0 lb

## 2014-12-19 DIAGNOSIS — Z113 Encounter for screening for infections with a predominantly sexual mode of transmission: Secondary | ICD-10-CM

## 2014-12-19 DIAGNOSIS — Z01419 Encounter for gynecological examination (general) (routine) without abnormal findings: Secondary | ICD-10-CM

## 2014-12-19 DIAGNOSIS — Z0000 Encounter for general adult medical examination without abnormal findings: Secondary | ICD-10-CM | POA: Diagnosis not present

## 2014-12-19 NOTE — Progress Notes (Signed)
Patient ID: Katherine Gregory, female   DOB: 1965/06/29, 49 y.o.   MRN: [id number]    Subjective:      Katherine Gregory is a 49 y.o. female here for a routine exam.  Current complaints: c/o irregular menses, with spotting between cycles, menses lasting about 5 days, denies any clots or cramping, with heavy flow uses pads and tampons.   Recent dx with Huntington's disease.  States she does see a Veterinary surgeon for her depression.     Personal health questionnaire:  Is patient [name], have a family history of breast and/or ovarian cancer: no Is there a family history of uterine cancer diagnosed at age < 81, gastrointestinal cancer, urinary tract cancer, family member who is a Personnel officer syndrome-associated carrier: no Is the patient overweight and hypertensive, family history of diabetes, personal history of gestational diabetes, preeclampsia or PCOS: yes Is patient over 60, have PCOS,  family history of premature CHD under age 90, diabetes, smoke, have hypertension or peripheral artery disease:  Yes mother: cva; father: HTN, DM, brother HTN, brother DM, sister: HTN.   At any time, has a partner hit, kicked or otherwise hurt or frightened you?: no Over the past 2 weeks, have you felt down, depressed or hopeless?: yes Over the past 2 weeks, have you felt little interest or pleasure in doing things?:no   Gynecologic History Patient's last menstrual period was 11/09/2014 (approximate). Contraception: tubal ligation Last Pap: 11/16/13. Results were: normal Last mammogram: 10/23/13. Results were: normal  Obstetric History OB History  Gravida Para Term Preterm AB SAB TAB Ectopic Multiple Living  4 2 2  2 1 1   2     # Outcome Date GA Lbr Len/2nd Weight Sex Delivery Anes PTL Lv  4 Term 11/17/06    Lindon Romp  3 Term 02/04/87    Milinda Pointer   Y  2 TAB           1 SAB               Past Medical History  Diagnosis Date  . Acid reflux   . Seasonal allergies   . Cellulitis   . GOITER    . URTICARIA   . FATIGUE   . Palpitations   . Dysuria   . FLANK PAIN, RIGHT   . HEADACHES, HX OF   . Fibromyalgia   . Bleeding gastric ulcer   . Cervical back pain with evidence of disc disease   . Vitamin D deficiency   . IBS (irritable bowel syndrome)   . Vertigo   . Depression   . Post partum depression   . Huntington's disease     Past Surgical History  Procedure Laterality Date  . Cesarean section  1988 & 2008    x 2  . Wisdom tooth extraction    . Cartilage surgery      RT wrist  . Cardiac catheterization      05/17/12 / waiting on results     Current outpatient prescriptions:  .  amitriptyline (ELAVIL) 50 MG tablet, Take 50 mg by mouth at bedtime., Disp: , Rfl:  .  celecoxib (CELEBREX) 100 MG capsule, Take 1 capsule (100 mg total) by mouth 2 (two) times daily., Disp: 60 capsule, Rfl: 11 .  diazepam (VALIUM) 2 MG tablet, Take 2 mg by mouth every 6 (six) hours as needed for anxiety (ANXIETY)., Disp: , Rfl:  .  gabapentin (NEURONTIN) 300 MG capsule, Take 900 mg by mouth  3 (three) times daily., Disp: , Rfl:  .  haloperidol (HALDOL) 1 MG tablet, Take 1 tablet (1 mg total) by mouth 3 (three) times daily., Disp: 90 tablet, Rfl: 11 .  meclizine (ANTIVERT) 25 MG tablet, Take 25 mg by mouth 3 (three) times daily as needed for dizziness (DIZZINESS)., Disp: , Rfl:  .  omeprazole (PRILOSEC) 20 MG capsule, Take 1 capsule (20 mg total) by mouth 2 (two) times daily. 30 minutes before breakfast and 30 minutes before supper, Disp: 60 capsule, Rfl: 3 .  ondansetron (ZOFRAN) 4 MG tablet, Take 4 mg by mouth every 6 (six) hours as needed for nausea or vomiting., Disp: , Rfl:  .  sertraline (ZOLOFT) 50 MG tablet, Take 25 mg by mouth daily. Take 1/2 tablet x 2 weeks then take 1 tablet daily, Disp: , Rfl:  .  traMADol (ULTRAM) 50 MG tablet, TAKE 1 TO 2 TABLETS BY MOUTH EVERY 12 HOURS AS NEEDED FOR MODERATE PAIN, Disp: 60 tablet, Rfl: 5 .  Vitamin D, Ergocalciferol, (DRISDOL) 50000 UNITS CAPS  capsule, Take 50,000 Units by mouth every 7 (seven) days. MONDAY, Disp: , Rfl:  Allergies  Allergen Reactions  . Ibuprofen Other (See Comments)    "It intensifies my pain." Made pain and symptoms worse. "It intensifies my pain."  . Morphine Itching    Red blotches on skin and burning.  . Morphine And Related Itching and Other (See Comments)    Reaction burns Reaction burns  . Penicillin G Itching  . Penicillins Hives and Itching  . Adhesive [Tape] Itching    Itching and burning at site Itching and burning at site  . Pseudoephedrine Other (See Comments) and Palpitations    Speed up heart rate Speed up heart rate    Social History  Substance Use Topics  . Smoking status: Never Smoker   . Smokeless tobacco: Never Used  . Alcohol Use: 0.6 oz/week    1 Glasses of wine per week     Comment: occassional    Family History  Problem Relation Age of Onset  . Hypertension Mother   . Huntington's disease Mother     deceased 2022-07-25  . Diabetes Father   . Hypertension Father   . Heart disease Father   . Scoliosis Sister   . Huntington's disease Sister   . Diabetes Brother   . Hypertension Brother   . Scoliosis Brother   . Skin cancer Maternal Grandmother       Review of Systems  Constitutional: negative for fatigue and weight loss Respiratory: negative for cough and wheezing Cardiovascular: negative for chest pain, fatigue and palpitations Gastrointestinal: negative for abdominal pain and change in bowel habits Musculoskeletal:+ for myalgias Neurological: negative for gait problems and tremors Behavioral/Psych: negative for abusive relationship, depression Endocrine: negative for temperature intolerance   Genitourinary:negative for  genital lesions, hot flashes, sexual problems and vaginal discharge, +abnormal menstrual periods Integument/breast: negative for breast lump, breast tenderness, nipple discharge and skin lesion(s)    Objective:       BP 123/80 mmHg  Pulse  108  Temp(Src) 98.5 F (36.9 C)  Ht 5\' 10"  (1.778 m)  Wt 256 lb (116.121 kg)  BMI 36.73 kg/m2  LMP 11/09/2014 (Approximate) General:   alert, cooperative, very pleasant  Skin:   no rash or abnormalities  Lungs:   clear to auscultation bilaterally  Heart:   regular rate and rhythm, S1, S2 normal, no murmur, click, rub or gallop  Breasts:   normal without suspicious masses,  skin or nipple changes or axillary nodes  Abdomen:  normal findings: no organomegaly, soft, non-tender and no hernia obese  Pelvis:  External genitalia: normal general appearance Urinary system: urethral meatus normal and bladder without fullness, nontender Vaginal: normal without tenderness, induration or masses Cervix: normal appearance, no CMT Adnexa: normal bimanual exam Uterus: retroverted and non-tender, normal size   Lab Review Urine pregnancy test Labs reviewed yes Radiologic studies reviewed yes  50% of 30 min visit spent on counseling and coordination of care.   Assessment:    Healthy female exam.   AUB STD screening examination   Plan:    Education reviewed: calcium supplements, depression evaluation, low fat, low cholesterol diet, safe sex/STD prevention, self breast exams, skin cancer screening and weight bearing exercise. Contraception: tubal ligation. Mammogram ordered. Follow up in: 1 PRN with Dr. Clearance Coots to discuss endometrial ablation for AUB.[name]   No orders of the defined types were placed in this encounter.   Orders Placed This Encounter  Procedures  . SureSwab, Vaginosis/Vaginitis Plus  . MM DIGITAL SCREENING BILATERAL    Standing Status: Future     Number of Occurrences:      Standing Expiration Date: 02/18/2016    Order Specific Question:  Reason for Exam (SYMPTOM  OR DIAGNOSIS REQUIRED)    Answer:  annual exam    Order Specific Question:  Is the patient pregnant?    Answer:  No    Order Specific Question:  Preferred imaging location?    Answer:  [facility]  . HIV  antibody (with reflex)  . Hepatitis B surface antigen  . RPR  . Hepatitis C antibody

## 2014-12-20 LAB — HEPATITIS B SURFACE ANTIGEN: Hepatitis B Surface Ag: NEGATIVE

## 2014-12-20 LAB — HIV ANTIBODY (ROUTINE TESTING W REFLEX): HIV 1&2 Ab, 4th Generation: NONREACTIVE

## 2014-12-20 LAB — HEPATITIS C ANTIBODY: HCV Ab: NEGATIVE

## 2014-12-21 LAB — RPR

## 2014-12-22 LAB — SURESWAB, VAGINOSIS/VAGINITIS PLUS
Atopobium vaginae: NOT DETECTED Log (cells/mL)
C. albicans, DNA: NOT DETECTED
C. glabrata, DNA: NOT DETECTED
C. parapsilosis, DNA: NOT DETECTED
C. trachomatis RNA, TMA: NOT DETECTED
C. tropicalis, DNA: NOT DETECTED
Gardnerella vaginalis: NOT DETECTED Log (cells/mL)
LACTOBACILLUS SPECIES: NOT DETECTED Log (cells/mL)
MEGASPHAERA SPECIES: NOT DETECTED Log (cells/mL)
N. gonorrhoeae RNA, TMA: NOT DETECTED
T. vaginalis RNA, QL TMA: NOT DETECTED

## 2014-12-25 LAB — PAP, TP IMAGING W/ HPV RNA, RFLX HPV TYPE 16,18/45: HPV mRNA, High Risk: NOT DETECTED

## 2014-12-26 ENCOUNTER — Ambulatory Visit
Admission: RE | Admit: 2014-12-26 | Discharge: 2014-12-26 | Disposition: A | Discharge status: Home / Self Care | Payer: Medicaid Other | Source: Ambulatory Visit | Attending: Certified Nurse Midwife | Admitting: Certified Nurse Midwife

## 2014-12-26 ENCOUNTER — Telehealth: Payer: Self-pay | Admitting: Neurology

## 2014-12-26 ENCOUNTER — Ambulatory Visit (INDEPENDENT_AMBULATORY_CARE_PROVIDER_SITE_OTHER): Payer: Medicaid Other | Admitting: Obstetrics

## 2014-12-26 ENCOUNTER — Encounter: Payer: Self-pay | Admitting: Obstetrics

## 2014-12-26 VITALS — BP 117/81 | HR 108 | Temp 97.9°F | Wt 254.0 lb

## 2014-12-26 DIAGNOSIS — N939 Abnormal uterine and vaginal bleeding, unspecified: Secondary | ICD-10-CM

## 2014-12-26 DIAGNOSIS — N946 Dysmenorrhea, unspecified: Secondary | ICD-10-CM

## 2014-12-26 DIAGNOSIS — Z01419 Encounter for gynecological examination (general) (routine) without abnormal findings: Secondary | ICD-10-CM

## 2014-12-26 MED ORDER — MEDROXYPROGESTERONE ACETATE 10 MG PO TABS: 10.0000 mg | ORAL_TABLET | Freq: Every day | ORAL | Status: DC

## 2014-12-26 MED ORDER — TRAMADOL HCL 50 MG PO TABS
50.0000 mg | ORAL_TABLET | Freq: Four times a day (QID) | ORAL | Status: DC | PRN
Start: 2014-12-26 — End: 2016-05-14

## 2014-12-26 NOTE — Patient Instructions (Signed)
Hysteroscopy Hysteroscopy is a procedure used for looking inside the womb (uterus). It may be done for various reasons, including:  To evaluate abnormal bleeding, fibroid (benign, noncancerous) tumors, polyps, scar tissue (adhesions), and possibly cancer of the uterus.  To look for lumps (tumors) and other uterine growths.  To look for causes of why a woman cannot get pregnant (infertility), causes of recurrent loss of pregnancy (miscarriages), or a lost intrauterine device (IUD).  To perform a sterilization by blocking the fallopian tubes from inside the uterus. In this procedure, a thin, flexible tube with a tiny light and camera on the end of it (hysteroscope) is used to look inside the uterus. A hysteroscopy should be done right after a menstrual period to be sure you are not pregnant. LET YOUR HEALTH CARE PROVIDER KNOW ABOUT:   Any allergies you have.  All medicines you are taking, including vitamins, herbs, eye drops, creams, and over-the-counter medicines.  Previous problems you or members of your family have had with the use of anesthetics.  Any blood disorders you have.  Previous surgeries you have had.  Medical conditions you have. RISKS AND COMPLICATIONS  Generally, this is a safe procedure. However, as with any procedure, complications can occur. Possible complications include:  Putting a hole in the uterus.  Excessive bleeding.  Infection.  Damage to the cervix.  Injury to other organs.  Allergic reaction to medicines.  Too much fluid used in the uterus for the procedure. BEFORE THE PROCEDURE   Ask your health care provider about changing or stopping any regular medicines.  Do not take aspirin or blood thinners for 1 week before the procedure, or as directed by your health care provider. These can cause bleeding.  If you smoke, do not smoke for 2 weeks before the procedure.  In some cases, a medicine is placed in the cervix the day before the procedure.  This medicine makes the cervix have a larger opening (dilate). This makes it easier for the instrument to be inserted into the uterus during the procedure.  Do not eat or drink anything for at least 8 hours before the surgery.  Arrange for someone to take you home after the procedure. PROCEDURE   You may be given a medicine to relax you (sedative). You may also be given one of the following:  A medicine that numbs the area around the cervix (local anesthetic).  A medicine that makes you sleep through the procedure (general anesthetic).  The hysteroscope is inserted through the vagina into the uterus. The camera on the hysteroscope sends a picture to a TV screen. This gives the surgeon a good view inside the uterus.  During the procedure, air or a liquid is put into the uterus, which allows the surgeon to see better.  Sometimes, tissue is gently scraped from inside the uterus. These tissue samples are sent to a lab for testing. AFTER THE PROCEDURE   If you had a general anesthetic, you may be groggy for a couple hours after the procedure.  If you had a local anesthetic, you will be able to go home as soon as you are stable and feel ready.  You may have some cramping. This normally lasts for a couple days.  You may have bleeding, which varies from light spotting for a few days to menstrual-like bleeding for 3-7 days. This is normal.  If your test results are not back during the visit, make an appointment with your health care provider to find out the   results. Document Released: 08/02/2000 Document Revised: 02/14/2013 Document Reviewed: 11/23/2012 Exit[employer] Patient Information 2015 [employer]. This information is not intended to replace advice given to you by your health care provider. Make sure you discuss any questions you have with your health care provider.   

## 2014-12-26 NOTE — Telephone Encounter (Signed)
Left message on her personal voicemail letting her know Dr. [name] is recommending for her not to drive for now due to her progressing symptoms.  I told her to please call back if she would like to schedule an earlier appt or had further questions.

## 2014-12-26 NOTE — Telephone Encounter (Signed)
Patient is calling because her hands have been shaking more and she is afraid to drive. Please call to discuss.

## 2014-12-27 ENCOUNTER — Encounter: Payer: Self-pay | Admitting: Obstetrics

## 2014-12-27 NOTE — Progress Notes (Signed)
Patient ID: Katherine Gregory, female   DOB: [birth year], 49 y.o.   MRN: [id number]  Chief Complaint  Patient presents with  . Follow-up    ablasation     HPI Katherine Gregory is a 49 y.o. female.  C/O heavy, painful periods.  Has had a BTL and periods heavy and painful periods since.  HPI  Past Medical History  Diagnosis Date  . Acid reflux   . Seasonal allergies   . Cellulitis   . GOITER   . URTICARIA   . FATIGUE   . Palpitations   . Dysuria   . FLANK PAIN, RIGHT   . HEADACHES, HX OF   . Fibromyalgia   . Bleeding gastric ulcer   . Cervical back pain with evidence of disc disease   . Vitamin D deficiency   . IBS (irritable bowel syndrome)   . Vertigo   . Depression   . Post partum depression   . Huntington's disease     Past Surgical History  Procedure Laterality Date  . Cesarean section  1988 & 2008    x 2  . Wisdom tooth extraction    . Cartilage surgery      RT wrist  . Cardiac catheterization      05/17/12 / waiting on results    Family History  Problem Relation Age of Onset  . Hypertension Mother   . Huntington's disease Mother     deceased 2022/08/01  . Diabetes Father   . Hypertension Father   . Heart disease Father   . Scoliosis Sister   . Huntington's disease Sister   . Diabetes Brother   . Hypertension Brother   . Scoliosis Brother   . Skin cancer Maternal Grandmother     Social History Social History  Substance Use Topics  . Smoking status: Never Smoker   . Smokeless tobacco: Never Used  . Alcohol Use: 0.6 oz/week    1 Glasses of wine per week     Comment: occassional    Allergies  Allergen Reactions  . Ibuprofen Other (See Comments)    "It intensifies my pain." Made pain and symptoms worse. "It intensifies my pain."  . Morphine Itching    Red blotches on skin and burning.  . Morphine And Related Itching and Other (See Comments)    Reaction burns Reaction burns  . Penicillin G Itching  . Penicillins Hives and Itching  . Adhesive  [Tape] Itching    Itching and burning at site Itching and burning at site  . Pseudoephedrine Other (See Comments) and Palpitations    Speed up heart rate Speed up heart rate    Current Outpatient Prescriptions  Medication Sig Dispense Refill  . amitriptyline (ELAVIL) 50 MG tablet Take 50 mg by mouth at bedtime.    . celecoxib (CELEBREX) 100 MG capsule Take 1 capsule (100 mg total) by mouth 2 (two) times daily. 60 capsule 11  . diazepam (VALIUM) 2 MG tablet Take 2 mg by mouth every 6 (six) hours as needed for anxiety (ANXIETY).    [name] gabapentin (NEURONTIN) 300 MG capsule Take 900 mg by mouth 3 (three) times daily.    . haloperidol (HALDOL) 1 MG tablet Take 1 tablet (1 mg total) by mouth 3 (three) times daily. 90 tablet 11  . meclizine (ANTIVERT) 25 MG tablet Take 25 mg by mouth 3 (three) times daily as needed for dizziness (DIZZINESS).    [name] omeprazole (PRILOSEC) 20 MG capsule Take 1 capsule (20 mg total)  by mouth 2 (two) times daily. 30 minutes before breakfast and 30 minutes before supper 60 capsule 3  . ondansetron (ZOFRAN) 4 MG tablet Take 4 mg by mouth every 6 (six) hours as needed for nausea or vomiting.    . sertraline (ZOLOFT) 50 MG tablet Take 25 mg by mouth daily. Take 1/2 tablet x 2 weeks then take 1 tablet daily    . Vitamin D, Ergocalciferol, (DRISDOL) 50000 UNITS CAPS capsule Take 50,000 Units by mouth every 7 (seven) days. MONDAY    . medroxyPROGESTERone (PROVERA) 10 MG tablet Take 1 tablet (10 mg total) by mouth daily. 30 tablet 0  . traMADol (ULTRAM) 50 MG tablet Take 1 tablet (50 mg total) by mouth every 6 (six) hours as needed for moderate pain. 40 tablet 2   No current facility-administered medications for this visit.    Review of Systems Review of Systems Constitutional: negative for fatigue and weight loss Respiratory: negative for cough and wheezing Cardiovascular: negative for chest pain, fatigue and palpitations Gastrointestinal: negative for abdominal pain and  change in bowel habits Genitourinary: positive for painful, heavy periods Integument/breast: negative for nipple discharge Musculoskeletal:negative for myalgias Neurological: negative for gait problems and tremors Behavioral/Psych: negative for abusive relationship, depression Endocrine: negative for temperature intolerance     Blood pressure 117/81, pulse 108, temperature 97.9 F (36.6 C), weight 254 lb (115.214 kg), last menstrual period 12/20/2014.  Physical Exam Physical Exam General:   alert  Skin:   no rash or abnormalities  Lungs:   clear to auscultation bilaterally  Heart:   regular rate and rhythm, S1, S2 normal, no murmur, click, rub or gallop  Breasts:   normal without suspicious masses, skin or nipple changes or axillary nodes  Abdomen:  normal findings: no organomegaly, soft, non-tender and no hernia  Pelvis:  External genitalia: normal general appearance Urinary system: urethral meatus normal and bladder without fullness, nontender Vaginal: normal without tenderness, induration or masses Cervix: normal appearance Adnexa: normal bimanual exam Uterus: anteverted and non-tender, normal size      Data Reviewed Labs Ultrasound  Assessment     AUB.  Considering Endometrial Ablation.     Plan    Provera Rx for endometrial preparation for ablation Tramadol Rx F/U in 1 month  No orders of the defined types were placed in this encounter.   Meds ordered this encounter  Medications  . medroxyPROGESTERone (PROVERA) 10 MG tablet    Sig: Take 1 tablet (10 mg total) by mouth daily.    Dispense:  30 tablet    Refill:  0  . traMADol (ULTRAM) 50 MG tablet    Sig: Take 1 tablet (50 mg total) by mouth every 6 (six) hours as needed for moderate pain.    Dispense:  40 tablet    Refill:  2

## 2015-01-20 ENCOUNTER — Other Ambulatory Visit: Payer: Self-pay | Admitting: Obstetrics

## 2015-01-20 ENCOUNTER — Other Ambulatory Visit: Payer: Self-pay | Admitting: *Deleted

## 2015-01-23 ENCOUNTER — Encounter (HOSPITAL_COMMUNITY): Payer: Self-pay

## 2015-01-23 ENCOUNTER — Encounter (HOSPITAL_COMMUNITY)
Admission: RE | Admit: 2015-01-23 | Discharge: 2015-01-23 | Disposition: A | Discharge status: Home / Self Care | Payer: Medicaid Other | Source: Ambulatory Visit | Attending: Obstetrics | Admitting: Obstetrics

## 2015-01-23 ENCOUNTER — Encounter (HOSPITAL_COMMUNITY): Payer: Self-pay | Admitting: Anesthesiology

## 2015-01-23 DIAGNOSIS — Z01818 Encounter for other preprocedural examination: Secondary | ICD-10-CM | POA: Diagnosis not present

## 2015-01-23 DIAGNOSIS — N939 Abnormal uterine and vaginal bleeding, unspecified: Secondary | ICD-10-CM | POA: Diagnosis not present

## 2015-01-23 LAB — BASIC METABOLIC PANEL
Anion gap: 8 (ref 5–15)
BUN: 9 mg/dL (ref 6–20)
CO2: 23 mmol/L (ref 22–32)
Calcium: 9 mg/dL (ref 8.9–10.3)
Chloride: 104 mmol/L (ref 101–111)
Creatinine, Ser: 0.93 mg/dL (ref 0.44–1.00)
GFR calc Af Amer: >60 mL/min (ref >60)
GFR calc non Af Amer: >60 mL/min (ref >60)
Glucose, Bld: 148 mg/dL — ABNORMAL HIGH (ref 65–99)
Potassium: 3.8 mmol/L (ref 3.5–5.1)
Sodium: 135 mmol/L (ref 135–145)

## 2015-01-23 LAB — CBC
HCT: 38.2 % (ref 36.0–46.0)
Hemoglobin: 12.4 g/dL (ref 12.0–15.0)
MCH: 27 pg (ref 26.0–34.0)
MCHC: 32.5 g/dL (ref 30.0–36.0)
MCV: 83 fL (ref 78.0–100.0)
Platelets: 342 10*3/uL (ref 150–400)
RBC: 4.6 MIL/uL (ref 3.87–5.11)
RDW: 15.3 % (ref 11.5–15.5)
WBC: 9.8 10*3/uL (ref 4.0–10.5)

## 2015-01-23 NOTE — Anesthesia Preprocedure Evaluation (Addendum)
Anesthesia Evaluation  Patient identified by MRN, date of birth, ID band Patient awake    Reviewed: Allergy & Precautions, NPO status , Patient's Chart, lab work & pertinent test results  Airway Mallampati: III  TM Distance: >3 FB Neck ROM: Full    Dental no notable dental hx. (+) Teeth Intact   Pulmonary neg pulmonary ROS,    Pulmonary exam normal breath sounds clear to auscultation       Cardiovascular negative cardio ROS Normal cardiovascular exam Rhythm:Regular Rate:Normal  Borderline HTN   Neuro/Psych  Neuromuscular disease negative psych ROS   GI/Hepatic PUD, GERD  Medicated and Controlled,IBS   Endo/Other  Borderline DM Obesity  Renal/GU   negative genitourinary   Musculoskeletal  (+) Fibromyalgia -  Abdominal (+) + obese,   Peds  Hematology   Anesthesia Other Findings   Reproductive/Obstetrics negative OB ROS Abnormal uterine bleeding                             Anesthesia Physical Anesthesia Plan  ASA: III  Anesthesia Plan: General   Post-op Pain Management:    Induction: Intravenous  Airway Management Planned: LMA  Additional Equipment:   Intra-op Plan:   Post-operative Plan: Extubation in OR  Informed Consent: I have reviewed the patients History and Physical, chart, labs and discussed the procedure including the risks, benefits and alternatives for the proposed anesthesia with the patient or authorized representative who has indicated his/her understanding and acceptance.   Dental advisory given  Plan Discussed with: CRNA, Anesthesiologist and Surgeon  Anesthesia Plan Comments: (No Toradol)       Anesthesia Quick Evaluation

## 2015-01-23 NOTE — Patient Instructions (Addendum)
Your procedure is scheduled on:  January 24, 2015  Enter through the Main Entrance of [facility] at:  6:00 am   Pick up the phone at the desk and dial [phone].  Call this number if you have problems the morning of surgery: [phone].  Remember: Do NOT eat food:  After midnight on Thursday  Do NOT drink clear liquids after:  Midnight on Thursday  Take these medicines the morning of surgery with a SIP OF WATER: zoloft, lasix, gabapentin, prilosec, Haldol     Do NOT wear jewelry (body piercing), metal hair clips/bobby pins, make-up, or nail polish. Do NOT wear lotions, powders, or perfumes.  You may wear deoderant. Do NOT shave for 48 hours prior to surgery. Do NOT bring valuables to the hospital. Contacts, dentures, or bridgework may not be worn into surgery. Have a responsible adult drive you home and stay with you for 24 hours after your procedure

## 2015-01-24 ENCOUNTER — Ambulatory Visit (HOSPITAL_COMMUNITY): Payer: Medicaid Other | Admitting: Anesthesiology

## 2015-01-24 ENCOUNTER — Encounter (HOSPITAL_COMMUNITY): Payer: Self-pay | Admitting: Emergency Medicine

## 2015-01-24 ENCOUNTER — Encounter (HOSPITAL_COMMUNITY)
Admission: RE | Disposition: A | Discharge status: Home / Self Care | Payer: Self-pay | Source: Ambulatory Visit | Attending: Obstetrics

## 2015-01-24 ENCOUNTER — Other Ambulatory Visit: Payer: Self-pay | Admitting: Obstetrics

## 2015-01-24 ENCOUNTER — Ambulatory Visit (HOSPITAL_COMMUNITY)
Admission: RE | Admit: 2015-01-24 | Discharge: 2015-01-24 | Disposition: A | Discharge status: Home / Self Care | Payer: Medicaid Other | Source: Ambulatory Visit | Attending: Obstetrics | Admitting: Obstetrics

## 2015-01-24 DIAGNOSIS — K589 Irritable bowel syndrome without diarrhea: Secondary | ICD-10-CM | POA: Diagnosis not present

## 2015-01-24 DIAGNOSIS — K219 Gastro-esophageal reflux disease without esophagitis: Secondary | ICD-10-CM | POA: Diagnosis not present

## 2015-01-24 DIAGNOSIS — N946 Dysmenorrhea, unspecified: Secondary | ICD-10-CM | POA: Diagnosis not present

## 2015-01-24 DIAGNOSIS — N938 Other specified abnormal uterine and vaginal bleeding: Secondary | ICD-10-CM | POA: Diagnosis not present

## 2015-01-24 DIAGNOSIS — Z880 Allergy status to penicillin: Secondary | ICD-10-CM | POA: Insufficient documentation

## 2015-01-24 DIAGNOSIS — M797 Fibromyalgia: Secondary | ICD-10-CM | POA: Diagnosis not present

## 2015-01-24 DIAGNOSIS — G8918 Other acute postprocedural pain: Secondary | ICD-10-CM

## 2015-01-24 DIAGNOSIS — N841 Polyp of cervix uteri: Secondary | ICD-10-CM | POA: Diagnosis not present

## 2015-01-24 DIAGNOSIS — N939 Abnormal uterine and vaginal bleeding, unspecified: Secondary | ICD-10-CM

## 2015-01-24 HISTORY — PX: DILITATION & CURRETTAGE/HYSTROSCOPY WITH HYDROTHERMAL ABLATION: SHX5570

## 2015-01-24 SURGERY — DILATATION & CURETTAGE/HYSTEROSCOPY WITH HYDROTHERMAL ABLATION
Anesthesia: General

## 2015-01-24 MED ORDER — DEXAMETHASONE SODIUM PHOSPHATE 10 MG/ML IJ SOLN
INTRAMUSCULAR | Status: DC | PRN
  Administered 2015-01-24: 4 mg via INTRAVENOUS

## 2015-01-24 MED ORDER — ONDANSETRON HCL 4 MG/2ML IJ SOLN
INTRAMUSCULAR | Status: DC | PRN
  Administered 2015-01-24: 4 mg via INTRAVENOUS

## 2015-01-24 MED ORDER — PROPOFOL 10 MG/ML IV BOLUS
INTRAVENOUS | Status: DC | PRN
  Administered 2015-01-24: 200 mg via INTRAVENOUS

## 2015-01-24 MED ORDER — LIDOCAINE HCL (CARDIAC) 20 MG/ML IV SOLN
INTRAVENOUS | Status: DC | PRN
  Administered 2015-01-24: 100 mg via INTRAVENOUS

## 2015-01-24 MED ORDER — OXYCODONE-ACETAMINOPHEN 5-325 MG PO TABS: 1.0000 | ORAL_TABLET | ORAL | Status: DC | PRN

## 2015-01-24 MED ORDER — SODIUM CHLORIDE 0.9 % IJ SOLN: 3.0000 mL | INTRAMUSCULAR | Status: DC | PRN

## 2015-01-24 MED ORDER — FENTANYL CITRATE (PF) 100 MCG/2ML IJ SOLN
25.0000 ug | INTRAMUSCULAR | Status: DC | PRN
  Administered 2015-01-24 (×2): 50 ug via INTRAVENOUS

## 2015-01-24 MED ORDER — MIDAZOLAM HCL 2 MG/2ML IJ SOLN
INTRAMUSCULAR | Status: AC
  Filled 2015-01-24: qty 4

## 2015-01-24 MED ORDER — SODIUM CHLORIDE 0.9 % IV SOLN: 250.0000 mL | INTRAVENOUS | Status: DC | PRN

## 2015-01-24 MED ORDER — FENTANYL CITRATE (PF) 100 MCG/2ML IJ SOLN
INTRAMUSCULAR | Status: AC
  Administered 2015-01-24: 50 ug via INTRAVENOUS
  Filled 2015-01-24: qty 2

## 2015-01-24 MED ORDER — MIDAZOLAM HCL 2 MG/2ML IJ SOLN
INTRAMUSCULAR | Status: DC | PRN
  Administered 2015-01-24: 1 mg via INTRAVENOUS

## 2015-01-24 MED ORDER — SCOPOLAMINE 1 MG/3DAYS TD PT72
1.0000 | MEDICATED_PATCH | Freq: Once | TRANSDERMAL | Status: DC
  Administered 2015-01-24: 1.5 mg via TRANSDERMAL

## 2015-01-24 MED ORDER — LIDOCAINE HCL 1 % IJ SOLN
INTRAMUSCULAR | Status: AC
  Filled 2015-01-24: qty 20

## 2015-01-24 MED ORDER — ACETAMINOPHEN 325 MG PO TABS: 650.0000 mg | ORAL_TABLET | ORAL | Status: DC | PRN

## 2015-01-24 MED ORDER — FENTANYL CITRATE (PF) 100 MCG/2ML IJ SOLN
INTRAMUSCULAR | Status: AC
  Filled 2015-01-24: qty 4

## 2015-01-24 MED ORDER — KETOROLAC TROMETHAMINE 30 MG/ML IJ SOLN
INTRAMUSCULAR | Status: AC
  Filled 2015-01-24: qty 1

## 2015-01-24 MED ORDER — LIDOCAINE HCL (CARDIAC) 20 MG/ML IV SOLN
INTRAVENOUS | Status: AC
  Filled 2015-01-24: qty 5

## 2015-01-24 MED ORDER — SODIUM CHLORIDE 0.9 % IJ SOLN: 3.0000 mL | Freq: Two times a day (BID) | INTRAMUSCULAR | Status: DC

## 2015-01-24 MED ORDER — FENTANYL CITRATE (PF) 100 MCG/2ML IJ SOLN
INTRAMUSCULAR | Status: DC | PRN
  Administered 2015-01-24: 100 ug via INTRAVENOUS

## 2015-01-24 MED ORDER — METOCLOPRAMIDE HCL 5 MG/ML IJ SOLN: 10.0000 mg | Freq: Once | INTRAMUSCULAR | Status: DC | PRN

## 2015-01-24 MED ORDER — MEPERIDINE HCL 25 MG/ML IJ SOLN
6.2500 mg | INTRAMUSCULAR | Status: DC | PRN
  Administered 2015-01-24: 6.25 mg via INTRAVENOUS

## 2015-01-24 MED ORDER — SCOPOLAMINE 1 MG/3DAYS TD PT72
MEDICATED_PATCH | TRANSDERMAL | Status: AC
  Administered 2015-01-24: 1.5 mg via TRANSDERMAL
  Filled 2015-01-24: qty 1

## 2015-01-24 MED ORDER — PROPOFOL 10 MG/ML IV BOLUS
INTRAVENOUS | Status: AC
  Filled 2015-01-24: qty 20

## 2015-01-24 MED ORDER — SILVER NITRATE-POT NITRATE 75-25 % EX MISC
CUTANEOUS | Status: DC | PRN
  Administered 2015-01-24: 3

## 2015-01-24 MED ORDER — LIDOCAINE HCL 1 % IJ SOLN
INTRAMUSCULAR | Status: DC | PRN
  Administered 2015-01-24: 18 mL

## 2015-01-24 MED ORDER — MEPERIDINE HCL 25 MG/ML IJ SOLN
INTRAMUSCULAR | Status: AC
  Filled 2015-01-24: qty 1

## 2015-01-24 MED ORDER — ONDANSETRON HCL 4 MG/2ML IJ SOLN
INTRAMUSCULAR | Status: AC
  Filled 2015-01-24: qty 2

## 2015-01-24 MED ORDER — SODIUM CHLORIDE 0.9 % IR SOLN
Status: DC | PRN
  Administered 2015-01-24: 3000 mL

## 2015-01-24 MED ORDER — LACTATED RINGERS IV SOLN
INTRAVENOUS | Status: DC
  Administered 2015-01-24: 07:00:00 via INTRAVENOUS

## 2015-01-24 MED ORDER — OXYCODONE HCL 5 MG PO TABS: 5.0000 mg | ORAL_TABLET | ORAL | Status: DC | PRN

## 2015-01-24 MED ORDER — DEXAMETHASONE SODIUM PHOSPHATE 4 MG/ML IJ SOLN
INTRAMUSCULAR | Status: AC
  Filled 2015-01-24: qty 1

## 2015-01-24 MED ORDER — ACETAMINOPHEN 650 MG RE SUPP
650.0000 mg | RECTAL | Status: DC | PRN
  Filled 2015-01-24: qty 1

## 2015-01-24 SURGICAL SUPPLY — 14 items
CATH ROBINSON RED A/P 16FR (CATHETERS) IMPLANT
CATH SILICONE 16FRX5CC (CATHETERS) ×2 IMPLANT
CLOTH BEACON ORANGE TIMEOUT ST (SAFETY) ×2 IMPLANT
CONTAINER PREFILL 10% NBF 60ML (FORM) ×4 IMPLANT
ELECT REM PT RETURN 9FT ADLT (ELECTROSURGICAL)
ELECTRODE REM PT RTRN 9FT ADLT (ELECTROSURGICAL) IMPLANT
GLOVE BIO SURGEON STRL SZ8 (GLOVE) ×4 IMPLANT
GOWN STRL REUS W/TWL LRG LVL3 (GOWN DISPOSABLE) ×4 IMPLANT
NS IRRIG 1000ML POUR BTL (IV SOLUTION) ×2 IMPLANT
PACK VAGINAL MINOR WOMEN LF (CUSTOM PROCEDURE TRAY) ×2 IMPLANT
PAD OB MATERNITY 4.3X12.25 (PERSONAL CARE ITEMS) ×2 IMPLANT
SET GENESYS HTA PROCERVA (MISCELLANEOUS) ×2 IMPLANT
TOWEL OR 17X24 6PK STRL BLUE (TOWEL DISPOSABLE) ×4 IMPLANT
WATER STERILE IRR 1000ML POUR (IV SOLUTION) ×2 IMPLANT

## 2015-01-24 NOTE — Discharge Instructions (Signed)

## 2015-01-24 NOTE — Anesthesia Procedure Notes (Signed)
Procedure Name: LMA Insertion Date/Time: 01/24/2015 7:34 AM Performed by: Elgie Congo Pre-anesthesia Checklist: Emergency Drugs available, Suction available, Patient being monitored and Patient identified Patient Re-evaluated:Patient Re-evaluated prior to inductionOxygen Delivery Method: Circle system utilized Preoxygenation: Pre-oxygenation with 100% oxygen Intubation Type: IV induction LMA: LMA inserted LMA Size: 4.0 Number of attempts: 1 Placement Confirmation: positive ETCO2 and breath sounds checked- equal and bilateral Tube secured with: Tape Dental Injury: Teeth and Oropharynx as per pre-operative assessment

## 2015-01-24 NOTE — Transfer of Care (Signed)
Immediate Anesthesia Transfer of Care Note  Patient: Katherine Gregory  Procedure(s) Performed: Procedure(s) with comments: DILATATION & CURETTAGE/HYSTEROSCOPY WITH HYDROTHERMAL ABLATION (N/A) - Ethelene Browns will be here  Patient Location: PACU  Anesthesia Type:General  Level of Consciousness: awake, alert  and oriented  Airway & Oxygen Therapy: Patient Spontanous Breathing and Patient connected to nasal cannula oxygen  Post-op Assessment: Report given to RN and Post -op Vital signs reviewed and stable  Post vital signs: Reviewed and stable  Last Vitals:  Filed Vitals:   01/24/15 0647  BP: 148/90  Pulse: 85  Temp: 36.8 C  Resp: 16    Complications: No apparent anesthesia complications

## 2015-01-24 NOTE — Anesthesia Postprocedure Evaluation (Signed)
  Anesthesia Post-op Note  Patient: Katherine Gregory  Procedure(s) Performed: Procedure(s) with comments: DILATATION & CURETTAGE/HYSTEROSCOPY WITH HYDROTHERMAL ABLATION (N/A) - Ethelene Browns will be here  Patient Location: PACU  Anesthesia Type:General  Level of Consciousness: awake, alert  and oriented  Airway and Oxygen Therapy: Patient Spontanous Breathing  Post-op Pain: mild  Post-op Assessment: Post-op Vital signs reviewed, Patient's Cardiovascular Status Stable, Respiratory Function Stable, Patent Airway, No signs of Nausea or vomiting and Pain level controlled              Post-op Vital Signs: Reviewed and stable  Last Vitals:  Filed Vitals:   01/24/15 0915  BP: 154/86  Pulse: 83  Temp:   Resp: 22    Complications: No apparent anesthesia complications

## 2015-01-24 NOTE — Op Note (Signed)
Preop Diagnosis: ABNORMAL UTERNE BLEEDING.  CERVICAL POLYP   Postop Diagnosis: ABNORMAL UTERNE BLEEDING.  CERVICAL POLYP   Procedure: DILATATION & CURETTAGE/HYSTEROSCOPY WITH HYDROTHERMAL ABLATION.  CERVICAL POLYPECTOMY   Anesthesia: General   Anesthesiologist: No responsible provider has been recorded for the case.   Attending: Brock Bad, MD   Assistant: None  Findings: Cervical polyp.  Normal endometrial cavity  Pathology: cervical polyp, endometrial curettings  Fluids:  UOP: 75ml  EBL: 5ml  Complications: None  Procedure: The patient was taken to the operating room after risks benefits and alternatives were discussed with patient, the patient verbalized understanding and consent signed and witnessed. The patient was placed under general anesthesia and prepped and draped in normal sterile fashion. A bivalve speculum was placed in the patient's vagina and the anterior lip of the cervix was grasped with a single-tooth tenaculum.  A paracervical block at 4 o'clock and 8 o'clock position done in routine fashion with 18 ml 1% Lidocaine.  A cervical polyp protruding in the cervical os was grasped with polyp forceps and submitted to pathology. The cervix was dilated 25 mm with Pratt dilators for passage of the hysteroscope. The uterus sounded to 7cm.  The hysteroscope was introduced into the uterine cavity with findings as noted above. A curettage was performed and currettings sent to pathology. The hysteroscope was reintroduced and hydrothermal ablation was performed without difficulty.   The tenaculum and bivalve speculum were removed and there was good hemostasis at the tenaculum sites. Sponge lap and needle count was correct. The patient tolerated procedure well and was returned to the recovery room in good condition.

## 2015-01-24 NOTE — H&P (Signed)
Katherine Gregory is an 49 y.o. female. H/O heavy and painful periods.  Pertinent Gynecological History: Menses: flow is moderate and with severe dysmenorrhea Bleeding: dysfunctional uterine bleeding Contraception: tubal ligation DES exposure: denies Blood transfusions: none Sexually transmitted diseases: no past history Previous GYN Procedures: none  Last mammogram: normal Date: 2016 Last pap: normal Date: 2016 OB History: G4, P2022   Menstrual History: Menarche age: 25  Patient's last menstrual period was 01/23/2015.    Past Medical History  Diagnosis Date  . Acid reflux   . Seasonal allergies   . Cellulitis   . GOITER   . URTICARIA   . FATIGUE   . Palpitations   . Dysuria   . FLANK PAIN, RIGHT   . HEADACHES, HX OF   . Fibromyalgia   . Bleeding gastric ulcer   . Cervical back pain with evidence of disc disease   . Vitamin D deficiency   . IBS (irritable bowel syndrome)   . Vertigo   . Depression   . Post partum depression   . Huntington's disease     Past Surgical History  Procedure Laterality Date  . Cesarean section  1988 & 2008    x 2  . Wisdom tooth extraction    . Cartilage surgery      RT wrist  . Cardiac catheterization      05/17/12 / waiting on results  . Cholecystectomy      Family History  Problem Relation Age of Onset  . Hypertension Mother   . Huntington's disease Mother     deceased 2022/08/19  . Diabetes Father   . Hypertension Father   . Heart disease Father   . Scoliosis Sister   . Huntington's disease Sister   . Diabetes Brother   . Hypertension Brother   . Scoliosis Brother   . Skin cancer Maternal Grandmother     Social History:  reports that she has never smoked. She has never used smokeless tobacco. She reports that she drinks about 0.6 oz of alcohol per week. She reports that she does not use illicit drugs.  Allergies:  Allergies  Allergen Reactions  . Ibuprofen Other (See Comments)    "It intensifies my pain." Made pain  and symptoms worse. "It intensifies my pain."  . Morphine Itching    Red blotches on skin and burning.  . Morphine And Related Itching and Other (See Comments)    Reaction burns Reaction burns  . Penicillin G Itching  . Penicillins Hives and Itching    Has patient had a PCN reaction causing immediate rash, facial/tongue/throat swelling, SOB or lightheadedness with hypotension: Yes Has patient had a PCN reaction causing severe rash involving mucus membranes or skin necrosis: No Has patient had a PCN reaction that required hospitalization No Has patient had a PCN reaction occurring within the last 10 years: No If all of the above answers are "NO", then may proceed with Cephalosporin use.   . Adhesive [Tape] Itching    Itching and burning at site Itching and burning at site  . Pseudoephedrine Other (See Comments) and Palpitations    Speed up heart rate Speed up heart rate    Prescriptions prior to admission  Medication Sig Dispense Refill Last Dose  . amitriptyline (ELAVIL) 50 MG tablet Take 50 mg by mouth at bedtime.   Past Month at Unknown time  . celecoxib (CELEBREX) 100 MG capsule Take 1 capsule (100 mg total) by mouth 2 (two) times daily. 60 capsule 11 Past  Month at Unknown time  . diazepam (VALIUM) 2 MG tablet Take 2 mg by mouth every 6 (six) hours as needed for anxiety (ANXIETY).   Past Month at Unknown time  . furosemide (LASIX) 20 MG tablet Take 20 mg by mouth daily.   Past Week at Unknown time  . gabapentin (NEURONTIN) 300 MG capsule Take 900 mg by mouth 3 (three) times daily.   01/23/2015 at Unknown time  . haloperidol (HALDOL) 1 MG tablet Take 1 tablet (1 mg total) by mouth 3 (three) times daily. 90 tablet 11 Past Week at Unknown time  . omeprazole (PRILOSEC) 40 MG capsule Take 40 mg by mouth daily.   Past Month at Unknown time  . sertraline (ZOLOFT) 50 MG tablet Take 75 mg by mouth daily. Take 1 and 1/2 tablets daily.   01/23/2015 at Unknown time  . meclizine (ANTIVERT) 25 MG  tablet Take 25 mg by mouth 3 (three) times daily as needed for dizziness (DIZZINESS).   Taking  . medroxyPROGESTERone (PROVERA) 10 MG tablet Take 1 tablet (10 mg total) by mouth daily. 30 tablet 0   . omeprazole (PRILOSEC) 20 MG capsule Take 1 capsule (20 mg total) by mouth 2 (two) times daily. 30 minutes before breakfast and 30 minutes before supper (Patient not taking: Reported on 01/20/2015) 60 capsule 3 Taking  . ondansetron (ZOFRAN) 4 MG tablet Take 4 mg by mouth every 6 (six) hours as needed for nausea or vomiting.   More than a month at Unknown time  . traMADol (ULTRAM) 50 MG tablet Take 1 tablet (50 mg total) by mouth every 6 (six) hours as needed for moderate pain. 40 tablet 2 More than a month at Unknown time  . Vitamin D, Ergocalciferol, (DRISDOL) 50000 UNITS CAPS capsule Take 50,000 Units by mouth every 7 (seven) days. MONDAY   Taking    Review of Systems  All other systems reviewed and are negative.   Blood pressure 148/90, pulse 85, temperature 98.2 F (36.8 C), temperature source Oral, resp. rate 16, last menstrual period 01/23/2015, SpO2 100 %. Physical Exam  Nursing note and vitals reviewed. Constitutional: She is oriented to person, place, and time. She appears well-developed and well-nourished.  HENT:  Head: Normocephalic and atraumatic.  Eyes: Conjunctivae are normal. Pupils are equal, round, and reactive to light.  Neck: Normal range of motion. Neck supple.  Cardiovascular: Normal rate and regular rhythm.   Respiratory: Effort normal and breath sounds normal.  GI: Soft. Bowel sounds are normal.  Genitourinary: Vagina normal and uterus normal.  Musculoskeletal: Normal range of motion.  Neurological: She is alert and oriented to person, place, and time.  Skin: Skin is warm and dry.  Psychiatric: She has a normal mood and affect. Her behavior is normal. Judgment and thought content normal.    Results for orders placed or performed during the hospital encounter of  01/23/15 (from the past 24 hour(s))  CBC     Status: None   Collection Time: 01/23/15 11:45 AM  Result Value Ref Range   WBC 9.8 4.0 - 10.5 K/uL   RBC 4.60 3.87 - 5.11 MIL/uL   Hemoglobin 12.4 12.0 - 15.0 g/dL   HCT 16.1 09.6 - 04.5 %   MCV 83.0 78.0 - 100.0 fL   MCH 27.0 26.0 - 34.0 pg   MCHC 32.5 30.0 - 36.0 g/dL   RDW 40.9 81.1 - 91.4 %   Platelets 342 150 - 400 K/uL  Basic metabolic panel  Status: Abnormal   Collection Time: 01/23/15 11:45 AM  Result Value Ref Range   Sodium 135 135 - 145 mmol/L   Potassium 3.8 3.5 - 5.1 mmol/L   Chloride 104 101 - 111 mmol/L   CO2 23 22 - 32 mmol/L   Glucose, Bld 148 (H) 65 - 99 mg/dL   BUN 9 6 - 20 mg/dL   Creatinine, Ser 2.13 0.44 - 1.00 mg/dL   Calcium 9.0 8.9 - 08.6 mg/dL   GFR calc non Af Amer >60 >60 mL/min   GFR calc Af Amer >60 >60 mL/min   Anion gap 8 5 - 15    No results found.  Assessment/Plan: AUB.  For HTA Endometrial Ablation.  Willies Laviolette A 01/24/2015, 6:50 AM

## 2015-01-27 ENCOUNTER — Encounter (HOSPITAL_COMMUNITY): Payer: Self-pay | Admitting: Obstetrics

## 2015-01-27 ENCOUNTER — Ambulatory Visit: Payer: Medicaid Other | Admitting: Obstetrics

## 2015-02-04 ENCOUNTER — Ambulatory Visit (INDEPENDENT_AMBULATORY_CARE_PROVIDER_SITE_OTHER): Payer: Medicaid Other | Admitting: Obstetrics

## 2015-02-04 VITALS — BP 131/79 | HR 105 | Temp 98.5°F | Ht 70.0 in | Wt 246.0 lb

## 2015-02-04 DIAGNOSIS — G8918 Other acute postprocedural pain: Secondary | ICD-10-CM | POA: Diagnosis not present

## 2015-02-04 DIAGNOSIS — N939 Abnormal uterine and vaginal bleeding, unspecified: Secondary | ICD-10-CM | POA: Diagnosis not present

## 2015-02-04 LAB — POCT URINALYSIS DIPSTICK
Bilirubin, UA: NEGATIVE
Blood, UA: 2
Glucose, UA: NEGATIVE
Ketones, UA: NEGATIVE
Nitrite, UA: NEGATIVE
Spec Grav, UA: 1.02
Urobilinogen, UA: NEGATIVE
pH, UA: 5

## 2015-02-04 NOTE — Progress Notes (Signed)
Subjective:     Katherine Gregory is a 49 y.o. female who presents to the clinic 2 weeks status post Endometrial Ablation for abnormal uterine bleeding. Eating a regular diet without difficulty. Bowel movements are normal. The patient is not having any pain.  The following portions of the patient's history were reviewed and updated as appropriate: allergies, current medications, past family history, past medical history, past social history, past surgical history and problem list.  Review of Systems A comprehensive review of systems was negative except for: Genitourinary: positive for appropriate vaginal spotting after Endometrial Ablation    Objective:    BP 131/79 mmHg  Pulse 105  Temp(Src) 98.5 F (36.9 C)  Ht  (1.778 m)  Wt 246 lb (111.585 kg)  BMI 35.30 kg/m2  LMP 02/03/2015 General:  alert and no distress  Abdomen: soft, bowel sounds active, non-tender  Incision:   None       Assessment:    Doing well postoperatively. Operative findings again reviewed. Pathology report discussed.    Plan:    1. Continue any current medications. 2. Wound care discussed. 3. Activity restrictions: none 4. Anticipated return to work: not applicable. 5. Follow up: 4 weeks.

## 2015-02-05 ENCOUNTER — Encounter: Payer: Self-pay | Admitting: Obstetrics

## 2015-02-06 ENCOUNTER — Ambulatory Visit: Payer: Medicaid Other | Admitting: Obstetrics

## 2015-03-03 ENCOUNTER — Ambulatory Visit: Payer: Medicaid Other | Admitting: Obstetrics

## 2015-03-07 NOTE — Telephone Encounter (Signed)
Error

## 2015-04-23 ENCOUNTER — Ambulatory Visit: Payer: Medicaid Other | Admitting: Neurology

## 2015-06-19 LAB — HEPATIC FUNCTION PANEL
ALT: 13 U/L (ref 7–35)
AST: 17 U/L (ref 13–35)
Alkaline Phosphatase: 159 U/L — AB (ref 25–125)
Bilirubin, Total: 0.3 mg/dL

## 2015-06-19 LAB — BASIC METABOLIC PANEL
BUN: 11 mg/dL (ref 4–21)
Creatinine: 0.9 mg/dL (ref 0.5–1.1)
Glucose: 89 mg/dL
Potassium: 4.1 mmol/L (ref 3.4–5.3)
Sodium: 141 mmol/L (ref 137–147)

## 2015-11-25 IMAGING — CR DG ABDOMEN ACUTE W/ 1V CHEST
3 series · 3 of 3 positions shown · non-contrast
Comparison: CT 08/22/2013.

CLINICAL DATA: Abdominal pain.

EXAM:
ACUTE ABDOMEN SERIES (ABDOMEN 2 VIEW & CHEST 1 VIEW)

[w chest pa]
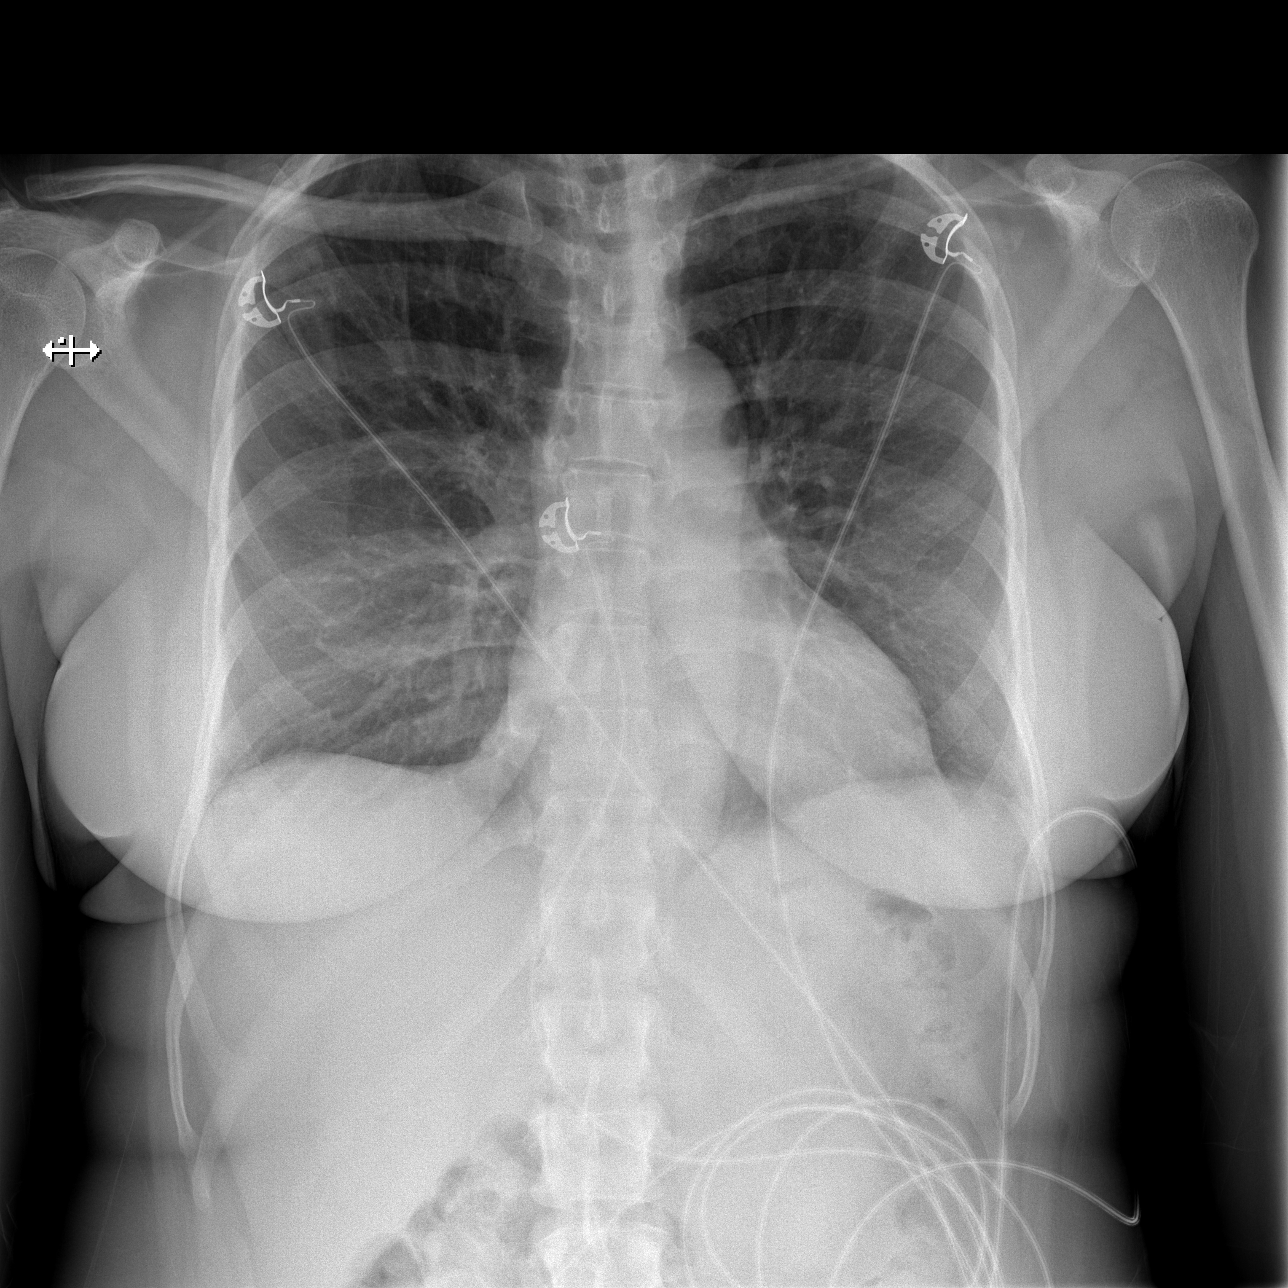

[w abdomen upright]
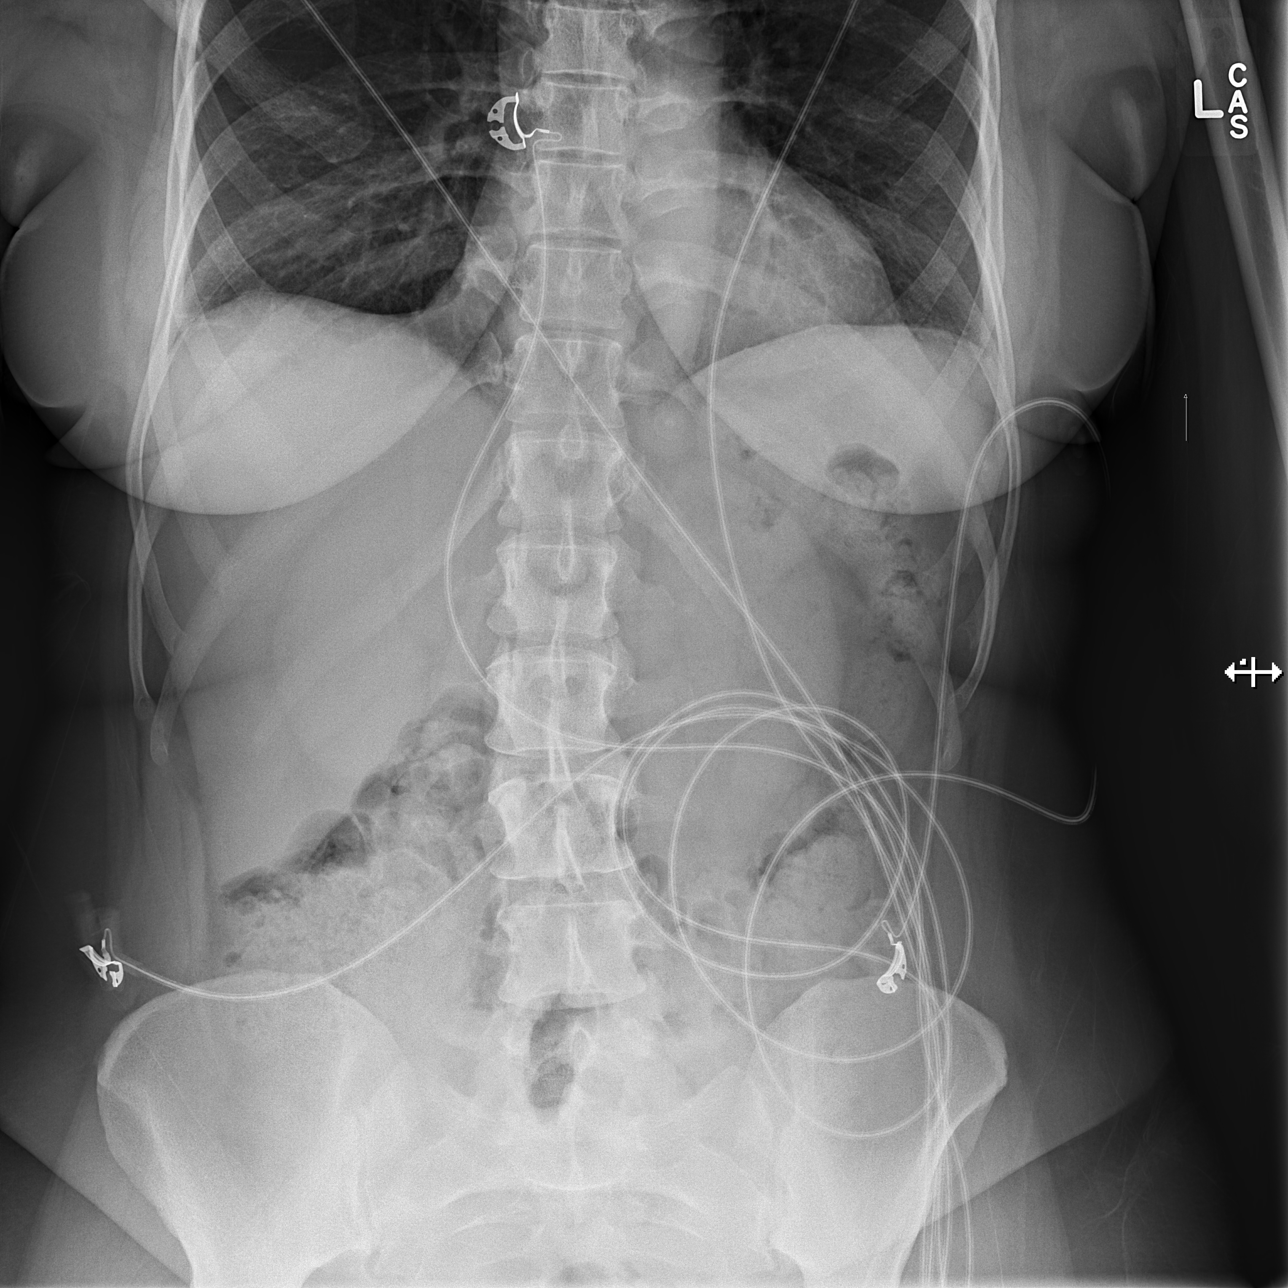

[t abdomen supine]
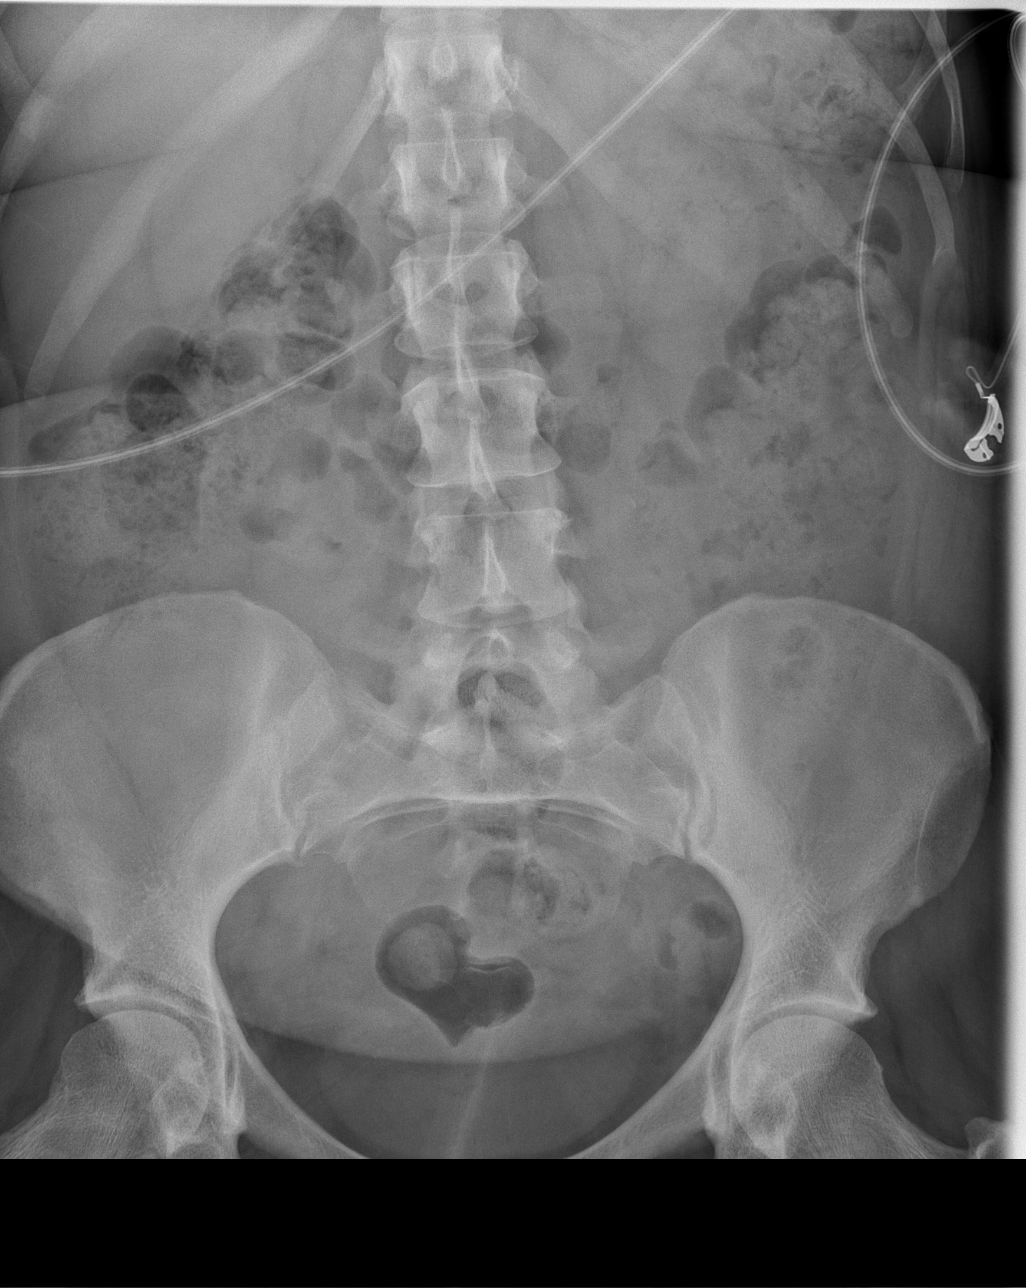

[3 of 3 positions shown; findings below may reference images not displayed]

FINDINGS: There is no evidence of dilated bowel loops or free intraperitoneal
air. No radiopaque calculi or other significant radiographic
abnormality is seen. Heart size and mediastinal contours are within
normal limits. Both lungs are clear.

Large colonic stool burden.
IMPRESSION: Negative abdominal radiographs.  No acute cardiopulmonary disease.

## 2016-04-20 ENCOUNTER — Ambulatory Visit: Payer: Medicaid Other | Admitting: Family Medicine

## 2016-04-28 ENCOUNTER — Ambulatory Visit (INDEPENDENT_AMBULATORY_CARE_PROVIDER_SITE_OTHER): Payer: Commercial Managed Care - HMO | Admitting: Family Medicine

## 2016-04-28 ENCOUNTER — Encounter: Payer: Self-pay | Admitting: Family Medicine

## 2016-04-28 VITALS — BP 118/80 | HR 86 | Resp 12 | Ht 70.0 in | Wt 222.5 lb

## 2016-04-28 DIAGNOSIS — G10 Huntington's disease: Secondary | ICD-10-CM | POA: Diagnosis not present

## 2016-04-28 DIAGNOSIS — R002 Palpitations: Secondary | ICD-10-CM | POA: Diagnosis not present

## 2016-04-28 DIAGNOSIS — E559 Vitamin D deficiency, unspecified: Secondary | ICD-10-CM | POA: Diagnosis not present

## 2016-04-28 DIAGNOSIS — M797 Fibromyalgia: Secondary | ICD-10-CM | POA: Diagnosis not present

## 2016-04-28 DIAGNOSIS — R739 Hyperglycemia, unspecified: Secondary | ICD-10-CM

## 2016-04-28 DIAGNOSIS — G4700 Insomnia, unspecified: Secondary | ICD-10-CM | POA: Diagnosis not present

## 2016-04-28 DIAGNOSIS — F339 Major depressive disorder, recurrent, unspecified: Secondary | ICD-10-CM

## 2016-04-28 DIAGNOSIS — H8110 Benign paroxysmal vertigo, unspecified ear: Secondary | ICD-10-CM | POA: Insufficient documentation

## 2016-04-28 LAB — BASIC METABOLIC PANEL
BUN: 9 mg/dL (ref 6–23)
CO2: 24 mEq/L (ref 19–32)
Calcium: 9.6 mg/dL (ref 8.4–10.5)
Chloride: 103 mEq/L (ref 96–112)
Creatinine, Ser: 0.94 mg/dL (ref 0.40–1.20)
GFR: 81.31 mL/min (ref >60.00)
Glucose, Bld: 95 mg/dL (ref 70–99)
Potassium: 3.9 mEq/L (ref 3.5–5.1)
Sodium: 139 mEq/L (ref 135–145)

## 2016-04-28 LAB — CBC
HCT: 41 % (ref 36.0–46.0)
Hemoglobin: 13.6 g/dL (ref 12.0–15.0)
MCHC: 33.3 g/dL (ref 30.0–36.0)
MCV: 83.6 fl (ref 78.0–100.0)
Platelets: 440 10*3/uL — ABNORMAL HIGH (ref 150.0–400.0)
RBC: 4.91 Mil/uL (ref 3.87–5.11)
RDW: 16.1 % — ABNORMAL HIGH (ref 11.5–15.5)
WBC: 6.2 10*3/uL (ref 4.0–10.5)

## 2016-04-28 LAB — VITAMIN D 25 HYDROXY (VIT D DEFICIENCY, FRACTURES): VITD: 16.3 ng/mL — ABNORMAL LOW (ref 30.00–100.00)

## 2016-04-28 LAB — TSH: TSH: 1.9 u[IU]/mL (ref 0.35–4.50)

## 2016-04-28 LAB — HEMOGLOBIN A1C: Hgb A1c MFr Bld: 5.9 % (ref 4.6–6.5)

## 2016-04-28 MED ORDER — RISPERIDONE 2 MG PO TABS: 2.0000 mg | ORAL_TABLET | Freq: Two times a day (BID) | ORAL | 1 refills | Status: DC

## 2016-04-28 NOTE — Progress Notes (Addendum)
Katherine Gregory is a 50 y.o. female, who is here today to establish care with me.   Former PCP: Dr Katherine Gregory, [place] back from [place]    She lives alone.   Her son's father lives closeby.   She does not exercise regularly and doesn't follow a healthy diet consistently.  She is on disability.   Concerns today: Requesting referral for psychiatrists, referral to neurologist, and medication refills.    -She is also complaining of "fluttering" heart for 3 months, at rest, episodes lasts for " a litle bit", about 30 seconds. She denies associated chest pain, diaphoresis, dyspnea, or dizziness. Reviewing past medical history, palpitation is listed.    She is reporting history of "severe depression", she denies history of bipolar disorder.   She also reports Hx of fibromyalgia, currently she is on Cymbalta 60 mg daily, which she has taken for about a year. She is wondering if she still needs to take this medication, she is afraid of side effects but has not had any to date.   She tells me that for the past 3-4 months she has not had fibromyalgia symptoms, she is on Cyclobenzaprine 10 mg twice daily as needed but in the past she has taken it more than recommended. She has not taken Cyclobenzaprine in a couple months. She takes Tramadol 50 mg as needed for pain, has not taken it in a couple months.   She also takes Trazodone 50 mg at bedtime for insomnia.   She has history of Huntington's disease, she has follow with neurologist regularly, currently she is on risperidone 2 mg twice daily. She was seeing Dr [name] in 2016.   She has a 49 yo daughter with children and a 309 yo son that lives with his father.   Also reporting history of vertigo, she has not had episodes in 3-4 months, she takes meclizine 25 mg as needed.   Reviewing records, September 2016 for glucose was 148, she denies history of diabetes.   Vitamin D deficiency, she is not longer on vitamin D supplementation.     Review of Systems   Constitutional: Positive for fatigue. Negative for activity change, appetite change, fever and unexpected weight change.  HENT: Negative for mouth sores, nosebleeds and trouble swallowing.  Respiratory: Negative for cough, shortness of breath and wheezing.  Cardiovascular: Positive for palpitations. Negative for chest pain and leg swelling.  Gastrointestinal: Negative for abdominal pain, nausea and vomiting.  Negative for changes in bowel habits.  Genitourinary: Negative for decreased urine volume, difficulty urinating and hematuria.  Musculoskeletal: Negative for gait problem and myalgias.  Skin: Negative for rash.  Neurological: Negative for seizures, syncope, weakness and headaches.  Psychiatric/Behavioral: Positive for sleep disturbance. Negative for hallucinations and suicidal ideas.           Current Outpatient Prescriptions on File Prior to Visit  Medication Sig Dispense Refill  . diazepam (VALIUM) 2 MG tablet Take 2 mg by mouth every 6 (six) hours as needed for anxiety (ANXIETY).    . furosemide (LASIX) 20 MG tablet Take 20 mg by mouth daily.    . meclizine (ANTIVERT) 25 MG tablet Take 25 mg by mouth 3 (three) times daily as needed for dizziness (DIZZINESS).    . ondansetron (ZOFRAN) 4 MG tablet Take 4 mg by mouth every 6 (six) hours as needed for nausea or vomiting.    . sertraline (ZOLOFT) 50 MG tablet Take 75 mg by mouth daily. Take 1 and 1/2 tablets  daily.    . traMADol (ULTRAM) 50 MG tablet Take 1 tablet (50 mg total) by mouth every 6 (six) hours as needed for moderate pain. 40 tablet 2   No current facility-administered medications on file prior to visit.        Past Medical History:  Diagnosis Date  . Acid reflux   . Bleeding gastric ulcer   . Cellulitis   . Cervical back pain with evidence of disc disease   . Depression   . Dysuria   . FATIGUE   . Fibromyalgia   . FLANK PAIN, RIGHT   . GOITER   . HEADACHES, HX OF   . Huntington's  disease (HCC)   . IBS (irritable bowel syndrome)   . Palpitations   . Post partum depression   . Seasonal allergies   . URTICARIA   . Vertigo   . Vitamin D deficiency         Allergies  Allergen Reactions  . Ibuprofen Other (See Comments)    "It intensifies my pain."  Made pain and symptoms worse.  "It intensifies my pain."  . Morphine Itching    Red blotches on skin and burning.  . Morphine And Related Itching and Other (See Comments)    Reaction burns  Reaction burns  . Penicillin G Itching  . Penicillins Hives and Itching    Has patient had a PCN reaction causing immediate rash, facial/tongue/throat swelling, SOB or lightheadedness with hypotension: Yes  Has patient had a PCN reaction causing severe rash involving mucus membranes or skin necrosis: No  Has patient had a PCN reaction that required hospitalization No  Has patient had a PCN reaction occurring within the last 10 years: No  If all of the above answers are "NO", then may proceed with Cephalosporin use.   . Adhesive [Tape] Itching    Itching and burning at site  Itching and burning at site  . Pseudoephedrine Other (See Comments) and Palpitations    Speed up heart rate  Speed up heart rate         Family History  Problem Relation Age of Onset  . Hypertension Mother   . Huntington's disease Mother     deceased 08-14-2022  . Diabetes Father   . Hypertension Father   . Heart disease Father   . Scoliosis Sister   . Huntington's disease Sister   . Diabetes Brother   . Hypertension Brother   . Scoliosis Brother   . Skin cancer Maternal Grandmother    Social History        Social History  . Marital status: Single    Spouse name: N/A  . Number of children: 2  . 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 Smoking status: Never Smoker  . Smokeless tobacco: Never Used  . Alcohol use 0.6 oz/week    1 Glasses of wine  per week     Comment: occassional  . Drug use: No  . Sexual activity: Not Currently    Birth control/ protection: Surgical     Comment: Tubal Ligation        Other Topics Concern  . None      Social History Narrative   Patient lives at home with a family member. Patient is single.   Disabled.   Education some college.   Left handed.  Caffeine some times not daily.      Vitals:   04/28/16 1030  BP: 118/80  Pulse: 86  Resp: 12   Body mass index is 31.93 kg/m.     Physical Exam  Nursing note and vitals reviewed.  Constitutional: She is oriented to person, place, and time. She appears well-developed. No distress.  HENT:  Head: Atraumatic.  Mouth/Throat: Oropharynx is clear and moist and mucous membranes are normal.  Eyes: Conjunctivae and EOM are normal. Pupils are equal, round, and reactive to light.  Cardiovascular: Normal rate and regular rhythm.  No murmur heard.  Pulses:  Dorsalis pedis pulses are 2+ on the right side, and 2+ on the left side.  Respiratory: Effort normal and breath sounds normal. No respiratory distress.  GI: Soft. She exhibits no mass. There is no hepatomegaly. There is no tenderness.  Musculoskeletal: She exhibits no edema.  No trigger points appreciated on chest wall, extremities, or back.  Lymphadenopathy:  She has no cervical adenopathy.  Neurological: She is alert and oriented to person, place, and time. She has normal strength. Gait normal.  Skin: Skin is warm. No erythema.  Psychiatric: She has a normal mood and affect.  Flat affect, slow speech. Well groomed, good eye contact.     ASSESSMENT AND PLAN:    Escarlet was seen today for establish care.   Diagnoses and all orders for this visit:   Insomnia, unspecified type  Good sleep hygiene.  No changes in current management, we discussed some risk of interaction between Trazodone and some of her other medications.  She will continue following with psychiatrist.   Benign  paroxysmal positional vertigo, unspecified laterality  Currently asymptomatic.  She will continue meclizine 25 mg 3 times per day as needed.  We discussed some side effects of medication.    Huntington's disease [facility](HCC)  Referral to neurologist was placed today.  No changes in current management, respectively that was sent to her pharmacy today.  She will continue following with neurologist.   - Ambulatory referral to Neurology  - risperiDONE (RISPERDAL) 2 MG tablet; Take 1 tablet (2 mg total) by mouth 2 (two) times daily.    Fibromyalgia  Currently she is asymptomatic, myalgias could be also related with Huntington's disease.  Since she is not having symptoms she was instructed to discontinue Cyclobenzaprine and Tramadol.  If needed in the future at the present also relaxant can be used, Baclofen.    Palpitations  Seems to be chronic, history does not suggest a serious cardiac process.  Multiple EKG's done, T wave abnormalities on anterior leads stable. Will consider EKG next OV.  Instructed about warning signs.  For the recommendations would be given according to lab results.  Follow-up in 3-4 months.  - TSH  - CBC  - Basic metabolic panel   Episode of recurrent major depressive disorder, unspecified depression episode severity (HCC)   Recommend establishing with psychiatrist, referral is not necessary. No changes in current management.  Vitamin D deficiency   Further recommendations will be given according to lab results.  - VITAMIN D 25 Hydroxy (Vit-D Deficiency, Fractures)    Hyperglycemia   Educated about primary prevention through a healthy diet and regular physical activity. Further recommendations will be given according to lab results.  - Hemoglobin A1c      Shellby Schlink G. Swazil[place]Jordan, MD  [facility].  [place] office.     This note was copied from visit documentation done during OV. We were having some issue  with EPIC and somehow did not save  information.  ________________________________________________________________________________________________________  Review of Systems   Physical Exam

## 2016-04-28 NOTE — Patient Instructions (Addendum)
A few things to remember from today's visit:   Huntington's disease (HCC) - Plan: Ambulatory referral to Neurology, risperiDONE (RISPERDAL) 2 MG tablet  Fibromyalgia  Palpitations - Plan: TSH, CBC, Basic metabolic panel  Benign paroxysmal positional vertigo, unspecified laterality  Insomnia, unspecified type  Episode of recurrent major depressive disorder, unspecified depression episode severity (HCC)  Vitamin D deficiency - Plan: VITAMIN D 25 Hydroxy (Vit-D Deficiency, Fractures)  Hyperglycemia - Plan: Hemoglobin A1c  Since pain has improved, no more Flexeril.  Please call and establish with psychiatrists in the area.  Neuro referral placed.   We have ordered labs or studies at this visit.  It can take up to 1-2 weeks for results and processing. IF results require follow up or explanation, we will call you with instructions. Clinically stable results will be released to your [facility]. If you have not heard from us or cannot find your results in [facility] in 2 weeks please contact our office at [phone].  If you are not yet signed up for [facility], please consider signing up  Please be sure medication list is accurate. If a new problem present, please set up appointment sooner than planned today.

## 2016-04-28 NOTE — Progress Notes (Signed)
Pre visit review using our clinic review tool, if applicable. No additional management support is needed unless otherwise documented below in the visit note. 

## 2016-04-29 ENCOUNTER — Encounter: Payer: Self-pay | Admitting: Family Medicine

## 2016-05-05 ENCOUNTER — Telehealth: Payer: Self-pay | Admitting: Family Medicine

## 2016-05-05 NOTE — Telephone Encounter (Signed)
Patient has an appt 07/28/2016.

## 2016-05-05 NOTE — Telephone Encounter (Signed)
Pt states she forgot to tell Dr SwazilandJordan a coupe of things: A couple of times she has crazy dreams 4 - 5 months ago, (lasted 2-3 months) then after that she felt "disconnected" several weeks Did not feel right interacting with others.  Not sure if related to her med's or the Huntington's disease..  Pt also had a yellow jell that came out of her bottom 4-5 months.  Pt states her specialist advised to inform the dr of any changes with her body.  Pt has an upcoming appt in march.

## 2016-05-05 NOTE — Telephone Encounter (Signed)
These symptoms can be related to Huntington's but also to her depression. She was instructed last OV to establish with psychiatrists, referral to neuro was also placed. She is supposed to follow in 3 months.  Thanks, BJ

## 2016-05-11 ENCOUNTER — Telehealth: Payer: Self-pay | Admitting: Family Medicine

## 2016-05-11 NOTE — Telephone Encounter (Signed)
Pt needs new rx duloxetine 60 mg #90 w/refills send to [facility]. This med was prescribe by her old md in [place]. Pt also would like blood work results

## 2016-05-12 NOTE — Telephone Encounter (Signed)
Okay to refill? 

## 2016-05-13 ENCOUNTER — Telehealth: Payer: Self-pay | Admitting: Family Medicine

## 2016-05-13 ENCOUNTER — Other Ambulatory Visit: Payer: Self-pay

## 2016-05-13 MED ORDER — DULOXETINE HCL 60 MG PO CPEP: 60.0000 mg | ORAL_CAPSULE | Freq: Every day | ORAL | 0 refills | Status: DC

## 2016-05-13 MED ORDER — VITAMIN D (ERGOCALCIFEROL) 1.25 MG (50000 UNIT) PO CAPS: 50000.0000 [IU] | ORAL_CAPSULE | ORAL | 1 refills | Status: DC

## 2016-05-13 NOTE — Telephone Encounter (Signed)
I prefer to keep dose of Cymbalta at 60 mg because risk of interaction with some of her meds.   It seems like she has called a few times with some concerns she did not mention when I first saw her.  Does she have an appt already with psychiatrists?  Please arrange appt to discuss some of her recent concerns. Thanks, BJ

## 2016-05-13 NOTE — Telephone Encounter (Signed)
° ° ° °  Pt said the pharmacy received the RX for   DULoxetine (CYMBALTA) 60 MG capsule    Pt said she also take 30mg  along with the 60 and is asking if the 30 mg can also be called in

## 2016-05-13 NOTE — Telephone Encounter (Signed)
See below

## 2016-05-13 NOTE — Telephone Encounter (Signed)
It is OK to fill Rx for Cymbalta 60 mg # 90/0. She is supposed to establish with psychiatrist. I believe I sent results of her labs thought My Chart.  Thanks, BJ

## 2016-05-13 NOTE — Telephone Encounter (Signed)
Rx sent. Left voicemail for patient to call back about lab results & advised that results were sent through mychart as well.

## 2016-05-13 NOTE — Telephone Encounter (Signed)
° ° ° ° °  Pt also request RX on TRAZODONE 50mg

## 2016-05-13 NOTE — Telephone Encounter (Signed)
Called patient back and gave lab results. Patient verbalized understanding and Rx for vitamin D sent to pharmacy. Patient will call back to see if she can come in for an appointment tomorrow at 2:30 or 3pm.

## 2016-05-13 NOTE — Addendum Note (Signed)
Addended by: Marcell AngerSELF, Meziah Blasingame E on: 05/13/2016 02:30 PM   Modules accepted: Orders

## 2016-05-14 ENCOUNTER — Ambulatory Visit (INDEPENDENT_AMBULATORY_CARE_PROVIDER_SITE_OTHER): Payer: Commercial Managed Care - HMO | Admitting: Family Medicine

## 2016-05-14 ENCOUNTER — Encounter: Payer: Self-pay | Admitting: Family Medicine

## 2016-05-14 VITALS — BP 120/85 | HR 92 | Resp 12 | Ht 70.0 in | Wt 223.0 lb

## 2016-05-14 DIAGNOSIS — G4700 Insomnia, unspecified: Secondary | ICD-10-CM | POA: Diagnosis not present

## 2016-05-14 DIAGNOSIS — M797 Fibromyalgia: Secondary | ICD-10-CM

## 2016-05-14 DIAGNOSIS — Z6832 Body mass index (BMI) 32.0-32.9, adult: Secondary | ICD-10-CM

## 2016-05-14 DIAGNOSIS — R159 Full incontinence of feces: Secondary | ICD-10-CM

## 2016-05-14 DIAGNOSIS — F339 Major depressive disorder, recurrent, unspecified: Secondary | ICD-10-CM

## 2016-05-14 DIAGNOSIS — Z6830 Body mass index (BMI) 30.0-30.9, adult: Secondary | ICD-10-CM | POA: Insufficient documentation

## 2016-05-14 DIAGNOSIS — E559 Vitamin D deficiency, unspecified: Secondary | ICD-10-CM

## 2016-05-14 MED ORDER — TRAZODONE HCL 50 MG PO TABS: 50.0000 mg | ORAL_TABLET | Freq: Every day | ORAL | 1 refills | Status: DC

## 2016-05-14 NOTE — Progress Notes (Signed)
Pre visit review using our clinic review tool, if applicable. No additional management support is needed unless otherwise documented below in the visit note. 

## 2016-05-14 NOTE — Patient Instructions (Addendum)
A few things to remember from today's visit:   Insomnia, unspecified type  Incontinence of feces, unspecified fecal incontinence type  Fibromyalgia  Episode of recurrent major depressive disorder, unspecified depression episode severity (HCC)   Caution with medication interaction.  Please schedule appointment with psychiatrists.  Marguerita BeardsKenneth J. Headen, MD  5.0 (3)  Psychiatrist  [address]  [phone]   Dr. Milagros Evenerupinder Kaur, MD  4.2 (28)  Psychiatrist  [address]  [phone]   Dr. Jacqulynn Cadetarey G. Bernardo Heaterottle Jr, MD  3.0 (4)  Psychiatrist  [address]  [phone]  Low fat diet.    Please be sure medication list is accurate. If a new problem present, please set up appointment sooner than planned today.

## 2016-05-14 NOTE — Telephone Encounter (Signed)
Spoke with patient yesterday, patient was suppose to call back with a time that she could come in to be scheduled for an appointment.    Olegario MessierKathy - do you mind reaching out to her and seeing if she can come in today? I still have the 2:30 spot blocked for her.

## 2016-05-14 NOTE — Progress Notes (Signed)
HPI:   Katherine Gregory is a 51 y.o. female, who is here today to follow on a few concerns she has had after her last OV.  She called a few days after her last OV reporting then 4-5 months ago she felt "disconnected", "not right interacting with others",and having "crazy dreams." These symptoms lasted about 2-3 months.  -C/O 4 months of yellowish mucus per rectum upon passing gas, last episode mid in 04/2016. "Now a trace here and there" , last one about 1-2 weeks ago. She is not sure about exacerbating factors like fat intake, she states that at some point she was "eating a lot of hamburgers." "Black pellets"  noted on tissue 3-4 times in a week, 04/2016. She has not noted blood in stool or melena.  Reporting colonoscopy done in 2016.   S/P cholecystectomy in 2016.  She also has Hx of IBS-D, [place] but [place] improved. Last bowel movement was yesterday, soft.  Denies abdominal pain, nausea, or vomiting.  Depression and anxiety: She has not tried to establish with psychiatrists as recommended. Recently she requested refills for Cymbalta, she has been on 90 mg daily. Last OV she was wondering if she could stop. Rx for Cymbalta 60 mg was sent and recommended continuing with this dose. She is also on Zoloft 50 mg and Valium 2 mg prn qid.  She denies worsening depression or anxiety, denies suicidal thoughts.  She is afraid that she will get "brain zaps", which happened in the past when she tried to wean medication herself.  Hx of fibromyalgia, she has also been on Flexeril and Tramadol. She has not taken these medications lately because symptoms have improved.  Insomnia: Requesting refills for Trazodone 50 mg, which she has taken for years and helps with insomnia.   Hx of Huntington disease, she was contacted from neurologists' office, she did not schedule appt because she  planning on calling Dr  Zannie CoveYan's office (last seen  10/2014).   -She also wants to discuss lab results, she could not access My Chart. 25 OH vit D low at 16.3. Today she picked up Ergocalciferol 50,000 U. CBC,BMP, TSH,and HgA1C otherwise normal.  She is not exercising regularly and has not ben consistent with a healthy diet.    Review of Systems  Constitutional: Positive for fatigue. Negative for appetite change, fever and unexpected weight change.  HENT: Negative for mouth sores, nosebleeds and trouble swallowing.   Respiratory: Negative for cough, shortness of breath and wheezing.   Cardiovascular: Negative for chest pain, palpitations and leg swelling.  Gastrointestinal: Positive for diarrhea. Negative for abdominal pain, nausea and vomiting.  Genitourinary: Negative for decreased urine volume and hematuria.  Neurological: Negative for syncope, weakness, numbness and headaches.  Psychiatric/Behavioral: Positive for sleep disturbance. Negative for confusion, hallucinations and suicidal ideas. The patient is nervous/anxious.       Current Outpatient Prescriptions on File Prior to Visit  Medication Sig Dispense Refill  . diazepam (VALIUM) 2 MG tablet Take 2 mg by mouth every 6 (six) hours as needed for anxiety (ANXIETY).    . DULoxetine (CYMBALTA) 60 MG capsule Take 1 capsule (60 mg total) by mouth daily. 90 capsule 0  . meclizine (ANTIVERT) 25 MG tablet Take 25 mg by mouth 3 (three) times daily as needed for dizziness (DIZZINESS).    [name]. risperiDONE (RISPERDAL) 2 MG tablet Take 1 tablet (2 mg total) by mouth 2 (two) times daily.  60 tablet 1  . sertraline (ZOLOFT) 50 MG tablet Take 75 mg by mouth daily. Take 1 and 1/2 tablets daily.     No current facility-administered medications on file prior to visit.      Past Medical History:  Diagnosis Date  . Acid reflux   . Bleeding gastric ulcer   . Cellulitis   . Cervical back pain with evidence of disc disease   . Depression   . Dysuria   . FATIGUE   . Fibromyalgia   . FLANK PAIN,  RIGHT   . GOITER   . HEADACHES, HX OF   . Huntington's disease (HCC)   . IBS (irritable bowel syndrome)   . Palpitations   . Post partum depression   . Seasonal allergies   . URTICARIA   . Vertigo   . Vitamin D deficiency    Allergies  Allergen Reactions  . Ibuprofen Other (See Comments)    "It intensifies my pain." Made pain and symptoms worse. "It intensifies my pain."  . Morphine Itching    Red blotches on skin and burning.  . Morphine And Related Itching and Other (See Comments)    Reaction burns Reaction burns  . Penicillin G Itching  . Penicillins Hives and Itching    Has patient had a PCN reaction causing immediate rash, facial/tongue/throat swelling, SOB or lightheadedness with hypotension: Yes Has patient had a PCN reaction causing severe rash involving mucus membranes or skin necrosis: No Has patient had a PCN reaction that required hospitalization No Has patient had a PCN reaction occurring within the last 10 years: No If all of the above answers are "NO", then may proceed with Cephalosporin use.   . Adhesive [Tape] Itching    Itching and burning at site Itching and burning at site  . Pseudoephedrine Other (See Comments) and Palpitations    Speed up heart rate Speed up heart rate    Social History   Social History  . Marital status: Single    Spouse name: N/A  . Number of children: 2  . 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 Smoking status: Never Smoker  . Smokeless tobacco: Never Used  . Alcohol use 0.6 oz/week    1 Glasses of wine per week     Comment: occassional  . Drug use: No  . Sexual activity: Not Currently    Birth control/ protection: Surgical     Comment: Tubal Ligation    Other Topics Concern  . None   Social History Narrative   Patient lives at home with a family member. Patient is single.   Disabled.   Education some college.   Left handed.    Caffeine some times not daily.    Vitals:   05/14/16 1439  BP: 120/85  Pulse: 92  Resp: 12   Body mass index is 32 kg/m.  Wt Readings from Last 3 Encounters:  05/14/16 223 lb (101.2 kg)  04/28/16 222 lb 8 oz (100.9 kg)  02/04/15 246 lb (111.6 kg)     Physical Exam  Nursing note and vitals reviewed. Constitutional: She is oriented to person, place, and time. She appears well-developed. No distress.  HENT:  Head: Atraumatic.  Mouth/Throat: Oropharynx is clear and moist and mucous membranes are normal.  Eyes: Conjunctivae and EOM are normal. Pupils are equal, round, and reactive to light.  Cardiovascular: Normal rate and regular rhythm.  No murmur heard. Respiratory: Effort normal and breath sounds normal. No respiratory distress.  GI: Soft. She exhibits no mass. There is no hepatomegaly. There is no tenderness.  Genitourinary:  Genitourinary Comments: Refused rectal exam  Musculoskeletal: She exhibits no edema.  Neurological: She is alert and oriented to person, place, and time. She has normal strength. No cranial nerve deficit. Coordination and gait normal.  Skin: Skin is warm. No erythema.  Psychiatric: She has a normal mood and affect.  Well groomed, good eye contact, slow speech with frequent pauses.       ASSESSMENT AND PLAN:     Diagnoses and all orders for this visit:    Incontinence of feces, unspecified fecal incontinence type  It has improved. We discussed possible causes. Recommend following a low fat diet. If worse GI evaluation may be necessary.  Instructed about warning signs. F/U as needed.  Insomnia, unspecified type  Good sleep hygiene. We discussed some side effects of Trazodone as well as some of mediations on her meds list and risk of interaction.  -     traZODone (DESYREL) 50 MG tablet; Take 1 tablet (50 mg total) by mouth at bedtime.  Fibromyalgia  Well controlled. Continue Cymbalta 60 mg daily. Low impact regular physical  activity and good sleep hygiene. Flexeril and Tramadol discontinued.  Episode of recurrent major depressive disorder, unspecified depression episode severity (HCC)  Stable. No changes in current management.  List of some psychiatrists in the area given. Instructed about warning signs.  BMI 32.0-32.9,adult  We discussed benefits of wt loss as well as adverse effects of obesity. Consistency with healthy diet and physical activity recommended.   Vitamin D deficiency  Ergocalciferol 50,000 U weekly x 8 weeks then q 2 weeks. Will re-check in 3-4 months.    -Ms. Rylen Tammee Thielke was advised to return sooner than planned today if new concerns arise.       Eber Ferrufino G. [place], MD  [facility]. [place] office.

## 2016-05-16 MED ORDER — VITAMIN D (ERGOCALCIFEROL) 1.25 MG (50000 UNIT) PO CAPS: 50000.0000 [IU] | ORAL_CAPSULE | ORAL | 1 refills | Status: DC

## 2016-05-18 ENCOUNTER — Telehealth: Payer: Self-pay | Admitting: Neurology

## 2016-05-18 NOTE — Telephone Encounter (Signed)
Spoke w/ patient - worked her into an earlier slot - appt on 06/17/16.

## 2016-05-18 NOTE — Telephone Encounter (Signed)
Pt called says she moved out of the state but has moved back. She has Huntington's disease, she was last eval by neurologist in Sept 2017. Currently an appt has made with Dr [name] for 3/29. The pt is wanting to be seen sooner. Please call

## 2016-05-20 ENCOUNTER — Encounter (HOSPITAL_COMMUNITY): Payer: Self-pay

## 2016-06-10 ENCOUNTER — Ambulatory Visit (HOSPITAL_COMMUNITY): Payer: Self-pay | Admitting: Psychology

## 2016-06-17 ENCOUNTER — Encounter: Payer: Self-pay | Admitting: Neurology

## 2016-06-17 ENCOUNTER — Ambulatory Visit (INDEPENDENT_AMBULATORY_CARE_PROVIDER_SITE_OTHER): Payer: Medicare HMO | Admitting: Neurology

## 2016-06-17 VITALS — BP 100/73 | HR 93 | Ht 70.0 in | Wt 215.0 lb

## 2016-06-17 DIAGNOSIS — G10 Huntington's disease: Secondary | ICD-10-CM

## 2016-06-17 DIAGNOSIS — Z79899 Other long term (current) drug therapy: Secondary | ICD-10-CM

## 2016-06-17 DIAGNOSIS — F339 Major depressive disorder, recurrent, unspecified: Secondary | ICD-10-CM

## 2016-06-17 MED ORDER — SERTRALINE HCL 50 MG PO TABS: 100.0000 mg | ORAL_TABLET | Freq: Every day | ORAL | 11 refills | Status: DC

## 2016-06-17 NOTE — Progress Notes (Signed)
Chief Complaint  Patient presents with  . Huntington's Disease    Last seen 10/2014.  She just moved back from [place] and is here to reestablish care.  Feels she is doing well overall.  Her dyskinesia has improved and her gait has been stable.  She is walking unassisted.  She has an appointment in 09-02-2022, with a psychiatrist, to continue care for her depression.      PATIENT: Katherine Gregory DOB: 1965-12-04  HISTORICAL  Katherine Gregory  is a 51 years old left-handed African-American female, referred by her primary care physician Dr. Zollie Pee for Hungtingons diseaes. Initial evaluation was in February 2016.  She was diagnosed with Huntington's disease by [facility] neurologist Dr. Evelena Leyden in November 2014, based on her symptoms, positive family history, and genetic testing,  Her mother suffered Huntington's disease, diagnosis was made at age 50, passed away at age 57 in 09/01/13, her younger sister at age 94 was also diagnosed with Huntington's disease, her mother has 7 children, 4 girls, 3 boys.  Katherine Gregory lives with her sister, she has a daughter at age 66, son age 8, grand daughter age 62   She become symptomatic around 2013, she noticed extreme fatigue, eventually has to quit her job because of fatigue, initially she was diagnosed with fibromyalgia, later she developed chorea movement, gait difficulty, recent few weeks, she also developed uncontrollable neck jerking movement, which has been very bothersome,  She is followed by Dr. Gilford Rile at yearly basis, hope to follow-up with a local neurologist to address her needs.  The most bothersome symptoms for her is chorea movement , especially her abnormal neck jerking movement, gait difficulty, she also has depression, Zoloft has been very helpful.  UPDATE Feb 11 th 2016: Hadol 57m tid has been very helpful, she has much less neck jerking movement, she continued to complains of extreme fatigue, bilateral knee pain, worsening gait  difficulty, she also has episode of difficulty swallowing, bowel incontinence, Is in the process of applying for disability  UPDATE October 22 2014: She is taking Hadol 135mtid, which has been helpful,  She often see things moving, white spots at her peripheral visual field. She can ambulate without assistant, but getting worse gradually, she has regular bowel movement, still wait on disability, she will follow up with Dr. WaGilford Rilegain. She lives with her sister, who is 5 years younger than she is, who has no signs of Huntington's disease.  Celebrex has been helpful for her knee pain  UPDATE Feb 8th 2018: [place], she lives by herself, now her primary care physician is Dr. Betty JoMartiniquet LeSeymourrimary care  This she complains of worsening depression, has psychiatrist appointment in 2 weeks, she is currently on Zoloft 50 mg one and half tablets daily, trazodone 50 mg at that time, Cymbalta 60 mg daily, Valium 2 mg as needed, Risperdal 2 mg twice a day,  I reviewed [facility] record in 2015, positive ANA, with titer of 1-160, diffuse pattern, negative RPR, ESR 41, normal CPK 28, TSH 2.6, vitamin B12 241, C-reactive protein 16.8, lactic acid 0.8, Ace level was normal 21, there was [place] testing in 2015 mentioned, but I do not have detailed report.  I also reviewed and summarzied records from Dr. WaGilford Rileated January 30 2014, Huntington's genetic testing repeat 42, MRI of the brain in 2014 was reported normal  REVIEW OF  SYSTEMS: Full 14 system review of systems performed and notable only for worsening depression  ALLERGIES: Allergies  Allergen Reactions  . Ibuprofen Other (See Comments)    "It intensifies my pain." Made pain and symptoms worse. "It intensifies my pain."  . Morphine Itching    Red blotches on skin and burning.  . Morphine And Related Itching and Other (See  Comments)    Reaction burns Reaction burns  . Penicillin G Itching  . Penicillins Hives and Itching    Has patient had a PCN reaction causing immediate rash, facial/tongue/throat swelling, SOB or lightheadedness with hypotension: Yes Has patient had a PCN reaction causing severe rash involving mucus membranes or skin necrosis: No Has patient had a PCN reaction that required hospitalization No Has patient had a PCN reaction occurring within the last 10 years: No If all of the above answers are "NO", then may proceed with Cephalosporin use.   . Adhesive [Tape] Itching    Itching and burning at site Itching and burning at site  . Pseudoephedrine Other (See Comments) and Palpitations    Speed up heart rate Speed up heart rate    HOME MEDICATIONS: Current Outpatient Prescriptions on File Prior to Visit  Medication Sig Dispense Refill  . diazepam (VALIUM) 2 MG tablet Take 2 mg by mouth every 6 (six) hours as needed for anxiety (ANXIETY).    . DULoxetine (CYMBALTA) 60 MG capsule Take 1 capsule (60 mg total) by mouth daily. 90 capsule 0  . meclizine (ANTIVERT) 25 MG tablet Take 25 mg by mouth 3 (three) times daily as needed for dizziness (DIZZINESS).    [name] risperiDONE (RISPERDAL) 2 MG tablet Take 1 tablet (2 mg total) by mouth 2 (two) times daily. 60 tablet 1  . sertraline (ZOLOFT) 50 MG tablet Take 75 mg by mouth daily. Take 1 and 1/2 tablets daily.    . traZODone (DESYREL) 50 MG tablet Take 1 tablet (50 mg total) by mouth at bedtime. 30 tablet 1  . Vitamin D, Ergocalciferol, (DRISDOL) 50000 units CAPS capsule Take 1 capsule (50,000 Units total) by mouth every 7 (seven) days. For 8 weeks, and then every 2 weeks. 12 capsule 1   No current facility-administered medications on file prior to visit.     PAST MEDICAL HISTORY: Past Medical History:  Diagnosis Date  . Acid reflux   . Bleeding gastric ulcer   . Cellulitis   . Cervical back pain with evidence of disc disease   . Depression   .  Dysuria   . FATIGUE   . Fibromyalgia   . FLANK PAIN, RIGHT   . GOITER   . HEADACHES, HX OF   . Huntington's disease ([place])   . IBS (irritable bowel syndrome)   . Palpitations   . Post partum depression   . Seasonal allergies   . URTICARIA   . Vertigo   . Vitamin D deficiency     PAST SURGICAL HISTORY: Past Surgical History  Procedure Laterality Date  . Cesarean section  1988 & 2008    x 2  . Wisdom tooth extraction    . Cartilage surgery      RT wrist  . Cardiac catheterization      FAMILY HISTORY: Family History  Problem Relation Age of Onset  . Hypertension Mother   . Huntington's disease Mother     deceased 08-05-22  . Diabetes Father   . Hypertension Father   . Heart disease Father   . Scoliosis Sister   .  Huntington's disease Sister   . Diabetes Brother   . Hypertension Brother   . Scoliosis Brother   . Skin cancer Maternal Grandmother     SOCIAL HISTORY:  Social History   Social History  . Marital status: Single    Spouse name: N/A  . Number of children: 2  . 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 Smoking status: Never Smoker  . Smokeless tobacco: Never Used  . Alcohol use 0.6 oz/week    1 Glasses of wine per week     Comment: occassional  . Drug use: No  . Sexual activity: Not Currently    Birth control/ protection: Surgical     Comment: Tubal Ligation    Other Topics Concern  . Not on file   Social History Narrative   Patient lives at home with a family member. Patient is single.   Disabled.   Education some college.   Left handed.   Caffeine some times not daily.     PHYSICAL EXAM   Vitals:   06/17/16 1055  BP: 100/73  Pulse: 93  Weight: 215 lb (97.5 kg)  Height: 5' 10"  (1.778 m)    Not recorded      Body mass index is 30.85 kg/m.  PHYSICAL EXAMNIATION:  Gen: NAD, conversant, well nourised, obese, well groomed                       Cardiovascular: Regular rate rhythm, no peripheral edema, warm, nontender. Eyes: Conjunctivae clear without exudates or hemorrhage Neck: Supple, no carotid bruise. Pulmonary: Clear to auscultation bilaterally   NEUROLOGICAL EXAM:  MENTAL STATUS: Speech:    Speech is normal; fluent and spontaneous with normal comprehension.  Cognition:    The patient is oriented to person, place, and time;     recent and remote memory intact;     language fluent;     normal attention, concentration,     fund of knowledge.  CRANIAL NERVES: CN II: Visual fields are full to confrontation. Pupil were equal round reactive to light. CN III, IV, VI: extraocular movement are normal. No ptosis. CN V: Facial sensation is intact to pinprick in all 3 divisions bilaterally. Corneal responses are intact.  CN VII: Face is symmetric with normal eye closure and smile. CN VIII: Hearing is normal to rubbing fingers CN IX, X: Palate elevates symmetrically. Phonation is normal. CN XI: Head turning and shoulder shrug are intact CN XII: Tongue is midline with normal movements and no atrophy.  MOTOR: There is no pronator drift of out-stretched arms. Muscle bulk and tone are normal. Muscle strength is normal.  REFLEXES: Reflexes are 2+ and symmetric at the biceps, triceps, knees, and ankles. Plantar responses are flexor.  SENSORY: Light touch, pinprick are intact in fingers and toes.  COORDINATION: Rapid alternating movements and fine finger movements are intact. There is no dysmetria on finger-to-nose and heel-knee-shin. There are no abnormal or extraneous movements.   GAIT/STANCE: Need to push up from seated position, wide based, cautious, unsteady     DIAGNOSTIC DATA (LABS, IMAGING, TESTING) - I reviewed patient records, labs, notes, testing and imaging myself where available.  Lab Results  Component Value Date   WBC 6.2 04/28/2016   HGB 13.6 04/28/2016   HCT 41.0 04/28/2016   MCV 83.6 04/28/2016    PLT 440.0 (H) 04/28/2016      Component  Value Date/Time   NA 139 04/28/2016 1146   NA 141 06/19/2015   NA 139 05/25/2014 1902   K 3.9 04/28/2016 1146   K 3.8 05/25/2014 1902   CL 103 04/28/2016 1146   CL 105 05/25/2014 1902   CO2 24 04/28/2016 1146   CO2 27 05/25/2014 1902   GLUCOSE 95 04/28/2016 1146   GLUCOSE 88 05/25/2014 1902   BUN 9 04/28/2016 1146   BUN 11 06/19/2015   BUN 9 05/25/2014 1902   CREATININE 0.94 04/28/2016 1146   CREATININE 0.91 05/25/2014 1902   CREATININE 0.87 04/28/2012 1727   CALCIUM 9.6 04/28/2016 1146   CALCIUM 9.2 05/25/2014 1902   PROT 7.9 08/21/2014 1143   PROT 8.0 05/25/2014 1902   ALBUMIN 3.7 08/21/2014 1143   ALBUMIN 3.3 (L) 05/25/2014 1902   AST 17 06/19/2015   AST 29 05/25/2014 1902   ALT 13 06/19/2015   ALT 24 05/25/2014 1902   ALKPHOS 159 (A) 06/19/2015   ALKPHOS 132 (H) 05/25/2014 1902   BILITOT 0.2 08/21/2014 1143   BILITOT 0.2 05/25/2014 1902   GFRNONAA >60 01/23/2015 1145   GFRNONAA >60 05/25/2014 1902   GFRNONAA >60 05/24/2013 2150   GFRAA >60 01/23/2015 1145   GFRAA >60 05/25/2014 1902   GFRAA >60 05/24/2013 2150   Lab Results  Component Value Date   CHOL 191 03/10/2012   HDL 52.10 03/10/2012   LDLCALC 109 (H) 03/10/2012   TRIG 150.0 (H) 03/10/2012   CHOLHDL 4 03/10/2012   Lab Results  Component Value Date   HGBA1C 5.9 04/28/2016   No results found for: [id number] Lab Results  Component Value Date   TSH 1.90 04/28/2016    ASSESSMENT AND PLAN  Dalyn Enedina Pair is a 51 y.o. female with genetic confirmed Huntington's disease, CAG repeat 42 per [facility] Dr. Evelena Leyden, strong family history, progressive worsening functional status,  Huntington's disease  Mild unsteady gait, no significant chorea movement noticed  Worsening depression polypharmacy treatment  I have increased her Zoloft to 50 mg 2 tablets every day  Continue current dose of trazodone 50 mg every night, Risperdal 2  mg twice a day, Cymbalta 60 mg daily, Valium as needed  Follow-up with her psychiatrist  Marcial Pacas, M.D. Ph.D.  [facility] [address] [place], [place] [address] [phone]

## 2016-06-28 ENCOUNTER — Ambulatory Visit (HOSPITAL_COMMUNITY): Payer: Self-pay | Admitting: Psychology

## 2016-06-30 ENCOUNTER — Ambulatory Visit (HOSPITAL_COMMUNITY): Payer: Commercial Managed Care - HMO | Admitting: Psychiatry

## 2016-07-06 ENCOUNTER — Other Ambulatory Visit: Payer: Self-pay | Admitting: Family Medicine

## 2016-07-19 ENCOUNTER — Ambulatory Visit (HOSPITAL_COMMUNITY): Payer: Self-pay | Admitting: Psychology

## 2016-07-23 ENCOUNTER — Ambulatory Visit (HOSPITAL_COMMUNITY): Payer: Self-pay | Admitting: Psychology

## 2016-07-27 NOTE — Progress Notes (Signed)
HPI:   Katherine Gregory is a 51 y.o. female, who is here today to follow on some chronic medical problems.  Last seen on 05/14/16. She has followed with Dr Krista Blue, neuro. Last OV on 06/17/16 to follow on Huntington's disease.  She has an appt psychiatrists tomorrow for depression, which she reported last OV was getting worse.  She is on Zoloft 50 mg daily, Trazodone 50 mg daily at bedtime.Valium prn,Cymbalta 60 mg,and Risperdal 2 mg bid. According to pt, Zoloft was increased to 100 mg by Dr Krista Blue but she was afraid to do so.  She is still having depressed mood , not worse since her last OV, states that she does not feel happy.  She denies suicidal thoughts. Lives alone, stays at home most of the day, she does not have transportation, she watches TV all day.   + Insomnia, "a little bit",sometimes it takes her a couple hours to fall asleep. She also is waking up, she cannot specify how often but it is not daily.   She has noted hip pain,shoulder, and feet. She has no problems during the day, achy, keeps her from sleep.Pain is not as bad as it used to be at the time she was Dx with fibromyalgia. She attributes problem to sleeping on air mattress.    Last OV,she was c/o stool incontinence, intermittently and usually with flatulence. It seemed to be exacerbated by fat intake. She is reporting no symptoms since last OV,changed her diet,avoiding food that trigger symptoms. She is trying to eat healthier, but she is not exercising. S/P cholecystectomy in 2016.    Review of Systems  Constitutional: Positive for fatigue. Negative for appetite change and chills.  HENT: Negative for mouth sores, nosebleeds and trouble swallowing.   Respiratory: Negative for cough, shortness of breath and wheezing.   Cardiovascular: Negative for chest pain, palpitations and leg swelling.  Gastrointestinal: Negative for abdominal pain, diarrhea, nausea and vomiting.  Genitourinary: Negative for  decreased urine volume and hematuria.  Musculoskeletal: Positive for arthralgias and myalgias.  Skin: Negative for rash.  Neurological: Negative for syncope, weakness, numbness and headaches.  Psychiatric/Behavioral: Positive for sleep disturbance. Negative for confusion and suicidal ideas. The patient is nervous/anxious.       Current Outpatient Prescriptions on File Prior to Visit  Medication Sig Dispense Refill  . diazepam (VALIUM) 2 MG tablet Take 2 mg by mouth every 6 (six) hours as needed for anxiety (ANXIETY).    [name] meclizine (ANTIVERT) 25 MG tablet Take 25 mg by mouth 3 (three) times daily as needed for dizziness (DIZZINESS).    [name] risperiDONE (RISPERDAL) 2 MG tablet Take 1 tablet (2 mg total) by mouth 2 (two) times daily. 60 tablet 1  . Vitamin D, Ergocalciferol, (DRISDOL) 50000 units CAPS capsule Take 1 capsule (50,000 Units total) by mouth every 7 (seven) days. For 8 weeks, and then every 2 weeks. 12 capsule 1   No current facility-administered medications on file prior to visit.      Past Medical History:  Diagnosis Date  . Acid reflux   . Bleeding gastric ulcer   . Cellulitis   . Cervical back pain with evidence of disc disease   . Depression   . Dysuria   . FATIGUE   . Fibromyalgia   . FLANK PAIN, RIGHT   . GOITER   . HEADACHES, HX OF   . Huntington's disease ([place])   . IBS (irritable bowel syndrome)   . Palpitations   .  Post partum depression   . Seasonal allergies   . URTICARIA   . Vertigo   . Vitamin D deficiency    Allergies  Allergen Reactions  . Ibuprofen Other (See Comments)    "It intensifies my pain." Made pain and symptoms worse. "It intensifies my pain."  . Morphine Itching    Red blotches on skin and burning.  . Morphine And Related Itching and Other (See Comments)    Reaction burns Reaction burns  . Penicillin G Itching  . Penicillins Hives and Itching    Has patient had a PCN reaction causing immediate rash, facial/tongue/throat  swelling, SOB or lightheadedness with hypotension: Yes Has patient had a PCN reaction causing severe rash involving mucus membranes or skin necrosis: No Has patient had a PCN reaction that required hospitalization No Has patient had a PCN reaction occurring within the last 10 years: No If all of the above answers are "NO", then may proceed with Cephalosporin use.   . Adhesive [Tape] Itching    Itching and burning at site Itching and burning at site  . Pseudoephedrine Other (See Comments) and Palpitations    Speed up heart rate Speed up heart rate    Social History   Social History  . Marital status: Single    Spouse name: N/A  . Number of children: 2  . 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 Smokeless tobacco: Never Used  . Alcohol use 0.6 oz/week    1 Glasses of wine per week     Comment: occassional  . Drug use: No  . Sexual activity: Not Currently    Birth control/ protection: Surgical     Comment: Tubal Ligation    Other Topics Concern  . None   Social History Narrative   Patient lives at home with a family member. Patient is single.   Disabled.   Education some college.   Left handed.   Caffeine some times not daily.    Vitals:   07/28/16 0915  BP: 118/80  Pulse: 95  Resp: 12   Body mass index is 30.47 kg/m.   Wt Readings from Last 3 Encounters:  07/28/16 212 lb 6 oz (96.3 kg)  06/17/16 215 lb (97.5 kg)  05/14/16 223 lb (101.2 kg)     Physical Exam  Nursing note and vitals reviewed. Constitutional: She is oriented to person, place, and time. She appears well-developed. No distress.  HENT:  Head: Atraumatic.  Mouth/Throat: Oropharynx is clear and moist. Mucous membranes are dry.  Eyes: Conjunctivae and EOM are normal. Pupils are equal, round, and reactive to light.  Cardiovascular: Normal rate and regular rhythm.   No murmur  heard. Respiratory: Effort normal and breath sounds normal. No respiratory distress.  GI: Soft. She exhibits no mass. There is no hepatomegaly. There is no tenderness.  Musculoskeletal: She exhibits no edema.  Tenderness upon palpation left interscapular and right chest wall, also tenderness lateral aspect of thighs. Rest trigger points negative. Shoulder,hip,and knee no pain with ROM, no limitation of movement.  Lymphadenopathy:    She has no cervical adenopathy.  Neurological: She is alert and oriented to person, place, and time. She has normal strength. Gait normal.  Skin: Skin is warm. No erythema.  Psychiatric: She has a normal mood and affect.  Well groomed, good eye contact.      ASSESSMENT AND PLAN:  Zalma was seen today for follow-up.  Diagnoses and all orders for this visit:  Insomnia, unspecified type  Good sleep hygiene. She could try Tylenol PM as needed at bedtime,since pain is the reason she cannot sleep. We discussed other treatment options, Doxepin or Amitriptyline.  Fibromyalgia  Low impact exercise,Tai Chi or Yoga. Continue Cymbalta 60 mg. Tylenol Pm at bedtime as needed. Amitriptyline could be consider next OV,which may help with sleep and pain. We discussed some side effects and the risk of interaction with some of her medications.   BMI 30.0-30.9,adult  She has lost about 11 Lb sine last OV 05/2016. We discussed benefits of wt loss as well as adverse effects of obesity. Consistency with healthy diet and physical activity recommended.   Episode of recurrent major depressive disorder, unspecified depression episode severity ([place])  Keep appt with psychiatrists tomorrow. No changes in current management.    -Ms. Almyra Melisia Leming was advised to return sooner than planned today if new concerns arise.       Pansie Guggisberg G. [place], MD  [facility]. [place] office.

## 2016-07-28 ENCOUNTER — Encounter: Payer: Self-pay | Admitting: Family Medicine

## 2016-07-28 ENCOUNTER — Ambulatory Visit (INDEPENDENT_AMBULATORY_CARE_PROVIDER_SITE_OTHER): Payer: Medicare HMO | Admitting: Family Medicine

## 2016-07-28 VITALS — BP 118/80 | HR 95 | Resp 12 | Ht 70.0 in | Wt 212.4 lb

## 2016-07-28 DIAGNOSIS — F339 Major depressive disorder, recurrent, unspecified: Secondary | ICD-10-CM | POA: Diagnosis not present

## 2016-07-28 DIAGNOSIS — M797 Fibromyalgia: Secondary | ICD-10-CM | POA: Diagnosis not present

## 2016-07-28 DIAGNOSIS — Z6830 Body mass index (BMI) 30.0-30.9, adult: Secondary | ICD-10-CM | POA: Diagnosis not present

## 2016-07-28 DIAGNOSIS — G4700 Insomnia, unspecified: Secondary | ICD-10-CM

## 2016-07-28 NOTE — Patient Instructions (Signed)
A few things to remember from today's visit:   Episode of recurrent major depressive disorder, unspecified depression episode severity ([place])  Insomnia, unspecified type  Fibromyalgia  Stretching exercises,Tai chi or yoga, healthier diet, and Tylenol PM.  Keep appointment with psychiatrist.   Please be sure medication list is accurate. If a new problem present, please set up appointment sooner than planned today.

## 2016-07-28 NOTE — Progress Notes (Signed)
Pre visit review using our clinic review tool, if applicable. No additional management support is needed unless otherwise documented below in the visit note. 

## 2016-07-29 ENCOUNTER — Ambulatory Visit (INDEPENDENT_AMBULATORY_CARE_PROVIDER_SITE_OTHER): Payer: Medicare HMO | Admitting: Psychiatry

## 2016-07-29 ENCOUNTER — Encounter (HOSPITAL_COMMUNITY): Payer: Self-pay | Admitting: Psychiatry

## 2016-07-29 VITALS — BP 122/74 | HR 101 | Ht 70.0 in | Wt 212.0 lb

## 2016-07-29 DIAGNOSIS — Z880 Allergy status to penicillin: Secondary | ICD-10-CM | POA: Diagnosis not present

## 2016-07-29 DIAGNOSIS — Z888 Allergy status to other drugs, medicaments and biological substances status: Secondary | ICD-10-CM

## 2016-07-29 DIAGNOSIS — F331 Major depressive disorder, recurrent, moderate: Secondary | ICD-10-CM

## 2016-07-29 DIAGNOSIS — G4700 Insomnia, unspecified: Secondary | ICD-10-CM

## 2016-07-29 DIAGNOSIS — G10 Huntington's disease: Secondary | ICD-10-CM | POA: Diagnosis not present

## 2016-07-29 DIAGNOSIS — M797 Fibromyalgia: Secondary | ICD-10-CM

## 2016-07-29 DIAGNOSIS — Z79899 Other long term (current) drug therapy: Secondary | ICD-10-CM | POA: Diagnosis not present

## 2016-07-29 MED ORDER — TRAZODONE HCL 50 MG PO TABS: 50.0000 mg | ORAL_TABLET | Freq: Every day | ORAL | 1 refills | Status: DC

## 2016-07-29 MED ORDER — DULOXETINE HCL 60 MG PO CPEP: 120.0000 mg | ORAL_CAPSULE | Freq: Every day | ORAL | 0 refills | Status: DC

## 2016-07-29 NOTE — Progress Notes (Signed)
Psychiatric Initial Adult Assessment   Patient Identification: Katherine Gregory MRN:  [id number] Date of Evaluation:  07/29/2016 Referral Source: pcp Chief Complaint:  depression, huntington's disease Visit Diagnosis:    ICD-9-CM ICD-10-CM   1. Moderate episode of recurrent major depressive disorder (HCC) 296.32 F33.1 DULoxetine (CYMBALTA) 60 MG capsule  2. Insomnia, unspecified type 780.52 G47.00 traZODone (DESYREL) 50 MG tablet  3. Fibromyalgia 729.1 M79.7 DULoxetine (CYMBALTA) 60 MG capsule  4. Huntington's disease (HCC) 333.4 G10    History of Present Illness:  Katherine Gregory is a 51 year old female with a history of Huntington's disease, diagnosed in 2015, and a history of major depressive disorder and fibromyalgia. She presents today for psychiatric intake assessment pharmacologic management.  Discussing with the patient, she shares some of her grief of being diagnosed with fibromyalgia, and how this is affected her life. She agrees that she tends to isolate herself from friends and family more than she did before the diagnosis. She worries that her children will have the disease, and has not had them tested yet. She reports that she spends time with her 108-year-old son often, and he lives with the patient's boyfriend (the father of the child). She reports that she tends to like to watch TV during the day or go for brief walks. She does not spend much time with friends. She is not working as she is on disability. She reports that the risperidone that she takes helps with her choreoathetotic movements, and some of her tremors. She does feel like she has a "numb" feeling throughout the day, and I spent time educating the patient that this may be at least partly due to risperidone.  She struggles with looking forward to the future, is tearful at times. Mostly, she has trouble enjoying things, and continues to struggle with anhedonia. She denies any suicidal thoughts. She does not  use alcohol or drugs. She is hopeful that she and her boyfriend may one day get married and be able to live together. They currently do not live together because of her faith and Baptist beliefs.  Regarding her medications, she uses trazodone nightly for sleep, and she takes Cymbalta in the morning along with 75 mg of Zoloft. I spent time educating the patient on the mechanisms of Cymbalta and Zoloft, and discussed that I would like to see her off of Zoloft, and fully optimized on Cymbalta for her depression and fibromyalgia. We discussed the alternative of coming off of Cymbalta and being fully on Zoloft (as Zoloft is well tolerated for depression and Huntington's disease), but the patient has had significant benefit from Cymbalta and her fibromyalgia pain.  The patient uses Valium sparingly and can continue to do so for vertigo or anxiety.  The patient is also on board with beginning therapy with a provider in this clinic, and will follow-up with this writer for medication management. No other questions or concerns at this time.  Associated Signs/Symptoms: Depression Symptoms:  depressed mood, anhedonia, insomnia, difficulty concentrating, hopelessness, (Hypo) Manic Symptoms:  none Anxiety Symptoms:  Excessive Worry, Psychotic Symptoms:  none PTSD Symptoms: Negative  Past Psychiatric History: History of major depressive disorder and fibromyalgia. No psychiatric hospitalizations  Previous Psychotropic Medications: Yes   Substance Abuse History in the last 12 months:  No.  Consequences of Substance Abuse: Negative  Past Medical History:  Past Medical History:  Diagnosis Date  . Acid reflux   . Bleeding gastric ulcer   . Cellulitis   . Cervical back pain with  evidence of disc disease   . Depression   . Dysuria   . FATIGUE   . Fibromyalgia   . FLANK PAIN, RIGHT   . GOITER   . HEADACHES, HX OF   . Huntington's disease (HCC)   . IBS (irritable bowel syndrome)   . Palpitations    . Post partum depression   . Seasonal allergies   . URTICARIA   . Vertigo   . Vitamin D deficiency     Past Surgical History:  Procedure Laterality Date  . CARDIAC CATHETERIZATION     05/17/12 / waiting on results  . CARTILAGE SURGERY     RT wrist  . CESAREAN SECTION  1988 & 2008   x 2  . CHOLECYSTECTOMY    . DILITATION & CURRETTAGE/HYSTROSCOPY WITH HYDROTHERMAL ABLATION N/A 01/24/2015   Procedure: DILATATION & CURETTAGE/HYSTEROSCOPY WITH HYDROTHERMAL ABLATION;  Surgeon: Brock Bad, MD;  Location: [facility];  Service: Gynecology;  Laterality: N/A;  Katherine Gregory will be here  . WISDOM TOOTH EXTRACTION      Family Psychiatric History: none  Family History:  Family History  Problem Relation Age of Onset  . Hypertension Mother   . Huntington's disease Mother     deceased 08-17-2022  . Diabetes Father   . Hypertension Father   . Heart disease Father   . Scoliosis Sister   . Huntington's disease Sister   . Diabetes Brother   . Hypertension Brother   . Scoliosis Brother   . Skin cancer Maternal Grandmother     Social History:   Social History   Social History  . Marital status: Single    Spouse name: N/A  . Number of children: 2  . 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 Smoking status: Never Smoker  . Smokeless tobacco: Never Used  . Alcohol use 0.6 oz/week    1 Glasses of wine per week     Comment: occassional  . Drug use: No  . Sexual activity: Not Currently    Birth control/ protection: Surgical     Comment: Tubal Ligation    Other Topics Concern  . None   Social History Narrative   Patient lives at home with a family member. Patient is single.   Disabled.   Education some college.   Left handed.   Caffeine some times not daily.    Additional Social History: lives alone, on disability  Allergies:   Allergies  Allergen Reactions  . Ibuprofen Other (See Comments)    "It  intensifies my pain." Made pain and symptoms worse. "It intensifies my pain."  . Morphine Itching    Red blotches on skin and burning.  . Morphine And Related Itching and Other (See Comments)    Reaction burns Reaction burns  . Penicillin G Itching  . Penicillins Hives and Itching    Has patient had a PCN reaction causing immediate rash, facial/tongue/throat swelling, SOB or lightheadedness with hypotension: Yes Has patient had a PCN reaction causing severe rash involving mucus membranes or skin necrosis: No Has patient had a PCN reaction that required hospitalization No Has patient had a PCN reaction occurring within the last 10 years: No If all of the above answers are "NO", then may proceed with Cephalosporin use.   . Adhesive [Tape] Itching    Itching and burning at site Itching and burning at site  . Pseudoephedrine Other (See Comments) and  Palpitations    Speed up heart rate Speed up heart rate    Metabolic Disorder Labs: Lab Results  Component Value Date   HGBA1C 5.9 04/28/2016   MPG 114 11/16/2013   No results found for: PROLACTIN Lab Results  Component Value Date   CHOL 191 03/10/2012   TRIG 150.0 (H) 03/10/2012   HDL 52.10 03/10/2012   CHOLHDL 4 03/10/2012   VLDL 30.0 03/10/2012   LDLCALC 109 (H) 03/10/2012     Current Medications: Current Outpatient Prescriptions  Medication Sig Dispense Refill  . diazepam (VALIUM) 2 MG tablet Take 2 mg by mouth every 6 (six) hours as needed for anxiety (ANXIETY).    . DULoxetine (CYMBALTA) 60 MG capsule Take 2 capsules (120 mg total) by mouth daily. 90 capsule 0  . meclizine (ANTIVERT) 25 MG tablet Take 25 mg by mouth 3 (three) times daily as needed for dizziness (DIZZINESS).    [name] risperiDONE (RISPERDAL) 2 MG tablet Take 1 tablet (2 mg total) by mouth 2 (two) times daily. 60 tablet 1  . traZODone (DESYREL) 50 MG tablet Take 1 tablet (50 mg total) by mouth at bedtime. 90 tablet 1  . Vitamin D, Ergocalciferol, (DRISDOL)  50000 units CAPS capsule Take 1 capsule (50,000 Units total) by mouth every 7 (seven) days. For 8 weeks, and then every 2 weeks. 12 capsule 1   No current facility-administered medications for this visit.     Neurologic: Headache: Negative Seizure: Negative Paresthesias:Negative  Musculoskeletal: Strength & Muscle Tone: within normal limits Gait & Station: normal Patient leans: N/A  Psychiatric Specialty Exam: Review of Systems  Constitutional: Negative.   HENT: Negative.   Respiratory: Negative.   Cardiovascular: Negative.   Gastrointestinal: Negative.   Musculoskeletal: Positive for back pain, myalgias and neck pain.  Neurological:       Huntingtons disease   Psychiatric/Behavioral: Positive for depression. The patient has insomnia.     Blood pressure 122/74, pulse (!) 101, height 5\' 10"  (1.778 m), weight 96.2 kg (212 lb).Body mass index is 30.42 kg/m.  General Appearance: Casual and Fairly Groomed  Eye Contact:  Good  Speech:  Clear and Coherent  Volume:  Normal  Mood:  Dysphoric  Affect:  Flat  Thought Process:  Coherent  Orientation:  Full (Time, Place, and Person)  Thought Content:  Logical  Suicidal Thoughts:  No  Homicidal Thoughts:  No  Memory:  Immediate;   Good  Judgement:  Good  Insight:  Fair  Psychomotor Activity:  Normal, no chorea movements noted  Concentration:  Concentration: Good  Recall:  NA  Fund of Knowledge:Good  Language: Good  Akathisia:  Negative  Handed:  Right  AIMS (if indicated):  n/a  Assets:  Communication Skills Desire for Improvement Financial Resources/Insurance Housing Intimacy Leisure Time Social Support Transportation  ADL's:  Intact  Cognition: WNL  Sleep:  6 hours nightly    Treatment Plan Summary: Katherine Gregory is a 51 year old female with Huntington's disease and major depressive disorder in addition to fibromyalgia. She was diagnosed approximately 2 years ago and is currently on disability for her  illness. She has a 68 and 40-year-old child, and is involved in the life and parenting of her 51-year-old child. She has a good relationship with her boyfriend, the father of her child. The patient's sister also has Huntington's disease. She has some social support from her family nearby, but admits that she tends to isolate herself since the diagnosis. She presents today for psychiatric medication management assessment.  At present she continues to struggle with psychomotor slowing and flattening affect related to her depressive symptoms, but I also suspect that risperidone is contributing to her constricted affect. She presents on Zoloft and Cymbalta for her depressive symptoms. The neuro protective effects seen was Zoloft can be also extrapolated to the serotonergic effects of Cymbalta, and I would prefer to have the patient on one activating antidepressant that would also help with her fibromyalgia symptoms. The patient and I agreed to discontinue Zoloft, and titrate Cymbalta to its maximum dose of 120 mg daily. She does not present with any acute safety issues or symptoms of psychosis. She will follow-up with writer in 3 months, and will also establish therapy follow-up at this clinic.  1. Moderate episode of recurrent major depressive disorder (HCC)   2. Insomnia, unspecified type   3. Fibromyalgia   4. Huntington's disease (HCC)    - Increase Cymbalta to 120 mg daily for superior antidepressant benefits, and further benefits in pain control of fibromyalgia - Discontinued Zoloft. The patient is currently taking 75 mg daily, although it appears that it was meant to be increased to 200 mg at her last neurology visit.  There does not appear to be any need to have 2 antidepressants on board, and adds confusion to the medication regimen.  Increasing Cymbalta will prevent any withdrawal from Zoloft. - Continue trazodone 50 mg 1-2 tablets nightly for sleep - Continue to follow with neurology - Follow up with  this writer in 3 months - Engage in therapy at this clinic for ongoing support and processing of her difficult circumstances   Burnard LeighAlexander Arya Brylin Stanislawski, MD 3/22/201810:57 AM

## 2016-07-29 NOTE — Patient Instructions (Signed)
STOP Zoloft  INCREASE Cymbalta to 120 mg daily in the morning  CONTINUE Trazodone at night for sleep - okay to use 2 tablets ( 100 mg)

## 2016-08-06 ENCOUNTER — Other Ambulatory Visit: Payer: Self-pay | Admitting: Family Medicine

## 2016-08-06 DIAGNOSIS — G10 Huntington's disease: Secondary | ICD-10-CM

## 2016-08-19 ENCOUNTER — Encounter (HOSPITAL_COMMUNITY): Payer: Self-pay | Admitting: Psychology

## 2016-08-19 ENCOUNTER — Ambulatory Visit (INDEPENDENT_AMBULATORY_CARE_PROVIDER_SITE_OTHER): Payer: Medicare HMO | Admitting: Psychology

## 2016-08-19 DIAGNOSIS — F331 Major depressive disorder, recurrent, moderate: Secondary | ICD-10-CM | POA: Diagnosis not present
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
## 2016-08-27 ENCOUNTER — Other Ambulatory Visit: Payer: Self-pay | Admitting: Family Medicine

## 2016-09-09 ENCOUNTER — Encounter (HOSPITAL_COMMUNITY): Payer: Self-pay | Admitting: Psychology

## 2016-09-09 ENCOUNTER — Ambulatory Visit (HOSPITAL_COMMUNITY): Payer: Medicare HMO | Admitting: Psychology

## 2016-09-09 NOTE — Progress Notes (Signed)
Katherine Lewie LoronMichelle Dia is a 51 y.o. female patient who called at her appointment time to report unable to make appointment today as her "uber ride messed up".  Forde Radon.        Herny Scurlock, LPC

## 2016-09-09 NOTE — Progress Notes (Signed)
HPI:   Katherine Gregory is a 51 y.o. female, who is here today to follow on her last OV. I last saw her on 07/28/16, since then she has followed with psychiatrists, Dr Rene KocherEksir.  Zoloft was discontinued.  Her next appt with psychiatrist 09/2016  Obesity:  Dietary changes since her last OV : She decreased fast food intake. She is eating frozen dinners now. Decreased eating out. Pop corn for snacking and not daily. Exercising: No yet   Fibromyalgia: She is currently on Cymbalta 60 mg, which was recently increased from 60 mg to 120 mg. Last OV she was c/o myalgias like the ones she had when she was first Dx with fibromyalgia. Pain has resolved.  In general she states that she is feeling "pretty good"  Last OV she was also c/o insomnia. Still having trouble falling asleep at night, so she feels fatigue during day. She is not longer waking up once she is asleep. She sleeps from 1 AM to 10 AM, gets up to take her medications, goes back to bed because one of her medications also makes her sleepy: Risperidone.    Review of Systems  Constitutional: Positive for fatigue. Negative for appetite change and fever.  HENT: Negative for mouth sores, nosebleeds and trouble swallowing.   Respiratory: Negative for shortness of breath and wheezing.   Cardiovascular: Negative for chest pain, palpitations and leg swelling.  Gastrointestinal: Negative for abdominal pain, nausea and vomiting.       Negative for changes in bowel habits.  Genitourinary: Negative for decreased urine volume and hematuria.  Musculoskeletal: Negative for arthralgias and myalgias.  Neurological: Negative for syncope, weakness and headaches.  Psychiatric/Behavioral: Positive for sleep disturbance. Negative for confusion and hallucinations. The patient is not nervous/anxious.       Current Outpatient Prescriptions on File Prior to Visit  Medication Sig Dispense Refill  . diazepam (VALIUM) 2 MG tablet Take  2 mg by mouth every 6 (six) hours as needed for anxiety (ANXIETY).    . DULoxetine (CYMBALTA) 60 MG capsule Take 2 capsules (120 mg total) by mouth daily. 90 capsule 0  . meclizine (ANTIVERT) 25 MG tablet Take 25 mg by mouth 3 (three) times daily as needed for dizziness (DIZZINESS).    [name]. risperiDONE (RISPERDAL) 2 MG tablet TAKE 1 TABLET (2 MG TOTAL) BY MOUTH 2 (TWO) TIMES DAILY. 60 tablet 1  . traZODone (DESYREL) 50 MG tablet Take 1 tablet (50 mg total) by mouth at bedtime. 90 tablet 1  . Vitamin D, Ergocalciferol, (DRISDOL) 50000 units CAPS capsule Take 1 capsule (50,000 Units total) by mouth every 7 (seven) days. For 8 weeks, and then every 2 weeks. 12 capsule 1  . Vitamin D, Ergocalciferol, (DRISDOL) 50000 units CAPS capsule TAKE ONE CAPSULE BY MOUTH EVERY 7 DAYS FOR 8WEEKS AND THEN EVERY 2WEEKS 4 capsule 1   No current facility-administered medications on file prior to visit.      Past Medical History:  Diagnosis Date  . Acid reflux   . Bleeding gastric ulcer   . Cellulitis   . Cervical back pain with evidence of disc disease   . Depression   . Dysuria   . FATIGUE   . Fibromyalgia   . FLANK PAIN, RIGHT   . GOITER   . HEADACHES, HX OF   . Huntington's disease (HCC)   . IBS (irritable bowel syndrome)   . Palpitations   . Post partum depression   . Seasonal allergies   .  URTICARIA   . Vertigo   . Vitamin D deficiency    Allergies  Allergen Reactions  . Ibuprofen Other (See Comments)    "It intensifies my pain." Made pain and symptoms worse. "It intensifies my pain."  . Morphine Itching    Red blotches on skin and burning.  . Morphine And Related Itching and Other (See Comments)    Reaction burns Reaction burns  . Penicillin G Itching  . Penicillins Hives and Itching    Has patient had a PCN reaction causing immediate rash, facial/tongue/throat swelling, SOB or lightheadedness with hypotension: Yes Has patient had a PCN reaction causing severe rash involving mucus  membranes or skin necrosis: No Has patient had a PCN reaction that required hospitalization No Has patient had a PCN reaction occurring within the last 10 years: No If all of the above answers are "NO", then may proceed with Cephalosporin use.   . Adhesive [Tape] Itching    Itching and burning at site Itching and burning at site  . Pseudoephedrine Other (See Comments) and Palpitations    Speed up heart rate Speed up heart rate    Social History   Social History  . Marital status: Single    Spouse name: N/A  . Number of children: 2  . 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 Smoking status: Never Smoker  . Smokeless tobacco: Never Used  . Alcohol use 0.6 oz/week    1 Glasses of wine per week     Comment: occassional  . Drug use: No  . Sexual activity: Not Currently    Birth control/ protection: Surgical     Comment: Tubal Ligation    Other Topics Concern  . None   Social History Narrative   Patient lives at home with a family member. Patient is single.   Disabled.   Education some college.   Left handed.   Caffeine some times not daily.    Vitals:   09/10/16 1233  BP: 100/80  Pulse: 90  Resp: 12  Temp: 97.6 F (36.4 C)   Body mass index is 29.39 kg/m.  Wt Readings from Last 3 Encounters:  09/10/16 204 lb 12.8 oz (92.9 kg)  07/28/16 212 lb 6 oz (96.3 kg)  06/17/16 215 lb (97.5 kg)    Physical Exam  Nursing note and vitals reviewed. Constitutional: She is oriented to person, place, and time. She appears well-developed. No distress.  HENT:  Head: Atraumatic.  Mouth/Throat: Oropharynx is clear and moist and mucous membranes are normal.  Eyes: Conjunctivae and EOM are normal.  Cardiovascular: Normal rate and regular rhythm.   No murmur heard. Pulses:      Dorsalis pedis pulses are 2+ on the right side, and 2+ on the left side.  Respiratory: Effort normal and breath  sounds normal. No respiratory distress.  GI: Soft. She exhibits no mass. There is no hepatomegaly. There is no tenderness.  Musculoskeletal: She exhibits edema (trace pedal edema,non pitting,bilateral.). She exhibits no tenderness.  Neurological: She is alert and oriented to person, place, and time. She has normal strength. Gait normal.  Skin: Skin is warm. No erythema.  Psychiatric: She has a normal mood and affect.  Well groomed, good eye contact.Slow speech.    ASSESSMENT AND PLAN:  Katherine Gregory was seen today for follow-up.  Diagnoses and all orders for this visit:  Fatigue, unspecified type  Hx of chronic fatigue,  which could be aggravated by poor sleep but also by some of her chronic medical problems and medications. It seems like Risperidone is causing drowsiness, I recommend asking Dr [name] or Dr [name], if she could change frequency of medication, instead bid to take whole dose at night. Healthy diet and regular physical activity may also help.   Fibromyalgia  Myalgias resolved. Continue Cymbalta 120 mg daily. Low impact exercise and good sleep hygiene.  Overweight with body mass index (BMI) 25.0-29.9  She lost about 8 lb since her last OV. Caution with salt intake, try to engage in regular physical activity. We discussed benefits of wt loss as well as adverse effects of obesity. Consistency with healthy diet and physical activity recommended.  Insomnia, unspecified type  Changing Risperidone to bedtime may help. Also avoiding napping during the day. Good sleep hygiene.     -Katherine Gregory was advised to return sooner than planned today if new concerns arise.       Kamaiyah Uselton G. [place], MD  [facility]. [facility].

## 2016-09-10 ENCOUNTER — Encounter: Payer: Self-pay | Admitting: Family Medicine

## 2016-09-10 ENCOUNTER — Ambulatory Visit (INDEPENDENT_AMBULATORY_CARE_PROVIDER_SITE_OTHER): Payer: Medicare HMO | Admitting: Family Medicine

## 2016-09-10 VITALS — BP 100/80 | HR 90 | Temp 97.6°F | Resp 12 | Wt 204.8 lb

## 2016-09-10 DIAGNOSIS — R5383 Other fatigue: Secondary | ICD-10-CM | POA: Diagnosis not present

## 2016-09-10 DIAGNOSIS — G4700 Insomnia, unspecified: Secondary | ICD-10-CM | POA: Diagnosis not present

## 2016-09-10 DIAGNOSIS — M797 Fibromyalgia: Secondary | ICD-10-CM | POA: Diagnosis not present

## 2016-09-10 DIAGNOSIS — E663 Overweight: Secondary | ICD-10-CM

## 2016-09-10 NOTE — Patient Instructions (Addendum)
WE NOW OFFER   [facility] FAST TRACK!!!  SAME DAY Appointments for ACUTE CARE  Such as: Sprains, Injuries, cuts, abrasions, rashes, muscle pain, joint pain, back pain Colds, flu, sore throats, headache, allergies, cough, fever  Ear pain, sinus and eye infections Abdominal pain, nausea, vomiting, diarrhea, upset stomach Animal/insect bites  3 Easy Ways to Schedule: Walk-In Scheduling Call in scheduling Mychart Sign-up: [email or web address]   A few things to remember from today's visit:   Fibromyalgia  BMI 30.0-30.9,adult  Insomnia, unspecified type  Fatigue, unspecified type  Ask psych if it is possible changing Risperdal to just night.   Please be sure medication list is accurate. If a new problem present, please set up appointment sooner than planned today.

## 2016-09-23 ENCOUNTER — Encounter (HOSPITAL_COMMUNITY): Payer: Self-pay | Admitting: Psychology

## 2016-09-23 ENCOUNTER — Ambulatory Visit (HOSPITAL_COMMUNITY): Payer: Self-pay | Admitting: Psychology

## 2016-09-23 NOTE — Progress Notes (Signed)
Katherine Gregory is a 51 y.o. female patient who informed just prior to appointment time that she wasn't able to make today's appointment.  Pt does have a f/u in 2 weeks. [name].        Forde RadonYATES,Melitta Tigue, LPC

## 2016-09-27 ENCOUNTER — Ambulatory Visit: Payer: Self-pay | Admitting: Family Medicine

## 2016-09-28 ENCOUNTER — Ambulatory Visit (HOSPITAL_COMMUNITY): Payer: Self-pay | Admitting: Psychiatry

## 2016-10-05 ENCOUNTER — Telehealth: Payer: Self-pay | Admitting: Family Medicine

## 2016-10-05 NOTE — Telephone Encounter (Signed)
Are we continuing to fill this medication or does it come from psychiatry now?

## 2016-10-05 NOTE — Telephone Encounter (Signed)
Psychiatrists is now managing this medication. Thanks, BJ

## 2016-10-05 NOTE — Telephone Encounter (Signed)
Pt needs refill duloxetine 60 mg #60 . Pt is out. [facility] [address]

## 2016-10-06 NOTE — Telephone Encounter (Signed)
Left voicemail letting her know Rx needs to come from Psychiatry.

## 2016-10-07 ENCOUNTER — Ambulatory Visit (INDEPENDENT_AMBULATORY_CARE_PROVIDER_SITE_OTHER): Payer: Medicare HMO | Admitting: Psychology

## 2016-10-07 ENCOUNTER — Telehealth (HOSPITAL_COMMUNITY): Payer: Self-pay

## 2016-10-07 DIAGNOSIS — F331 Major depressive disorder, recurrent, moderate: Secondary | ICD-10-CM

## 2016-10-07 DIAGNOSIS — M797 Fibromyalgia: Secondary | ICD-10-CM

## 2016-10-07 MED ORDER — DULOXETINE HCL 60 MG PO CPEP: 120.0000 mg | ORAL_CAPSULE | Freq: Every day | ORAL | 1 refills | Status: DC

## 2016-10-07 NOTE — Telephone Encounter (Signed)
Medication refill request - Patient walked in with request for a refill of her Cymbalta 60 mg, 2 a day as she had to cancel appt. 09/28/16 and reschedule for 12/01/16. Patient has a refill remaining for Trazodone. Met with Dr. Lovena Le to request refill for patient as Dr. Daron Offer is out this date.  Dr. Lovena Le provided a new order for patient's Cymbalta 60 mg, 2 a day as patient reported she is now taking this dosage.  New order e-scribed to patient's [facility] per request of patient with 1 refill per Dr. Tanna Furry verbal order.
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
## 2016-10-08 NOTE — Telephone Encounter (Signed)
Excellent thank you

## 2016-10-26 ENCOUNTER — Other Ambulatory Visit: Payer: Self-pay | Admitting: Family Medicine

## 2016-10-26 DIAGNOSIS — E559 Vitamin D deficiency, unspecified: Secondary | ICD-10-CM

## 2016-10-26 MED ORDER — VITAMIN D (ERGOCALCIFEROL) 1.25 MG (50000 UNIT) PO CAPS: 50000.0000 [IU] | ORAL_CAPSULE | ORAL | 1 refills | Status: DC

## 2016-10-29 ENCOUNTER — Other Ambulatory Visit (HOSPITAL_COMMUNITY)
Admission: RE | Admit: 2016-10-29 | Discharge: 2016-10-29 | Disposition: A | Discharge status: Home / Self Care | Payer: Medicare HMO | Source: Ambulatory Visit | Attending: Family Medicine | Admitting: Family Medicine

## 2016-10-29 ENCOUNTER — Encounter: Payer: Self-pay | Admitting: Family Medicine

## 2016-10-29 ENCOUNTER — Other Ambulatory Visit: Payer: Self-pay | Admitting: Family Medicine

## 2016-10-29 ENCOUNTER — Ambulatory Visit (INDEPENDENT_AMBULATORY_CARE_PROVIDER_SITE_OTHER): Payer: Medicare HMO | Admitting: Family Medicine

## 2016-10-29 VITALS — BP 102/70 | HR 95 | Resp 12 | Ht 70.0 in | Wt 199.2 lb

## 2016-10-29 DIAGNOSIS — Z1231 Encounter for screening mammogram for malignant neoplasm of breast: Secondary | ICD-10-CM | POA: Diagnosis not present

## 2016-10-29 DIAGNOSIS — Z124 Encounter for screening for malignant neoplasm of cervix: Secondary | ICD-10-CM | POA: Diagnosis not present

## 2016-10-29 DIAGNOSIS — Z113 Encounter for screening for infections with a predominantly sexual mode of transmission: Secondary | ICD-10-CM | POA: Diagnosis not present

## 2016-10-29 DIAGNOSIS — Z114 Encounter for screening for human immunodeficiency virus [HIV]: Secondary | ICD-10-CM | POA: Diagnosis not present

## 2016-10-29 DIAGNOSIS — Z0000 Encounter for general adult medical examination without abnormal findings: Secondary | ICD-10-CM | POA: Diagnosis not present

## 2016-10-29 DIAGNOSIS — Z13220 Encounter for screening for lipoid disorders: Secondary | ICD-10-CM | POA: Diagnosis not present

## 2016-10-29 DIAGNOSIS — G4700 Insomnia, unspecified: Secondary | ICD-10-CM

## 2016-10-29 DIAGNOSIS — R748 Abnormal levels of other serum enzymes: Secondary | ICD-10-CM

## 2016-10-29 DIAGNOSIS — E559 Vitamin D deficiency, unspecified: Secondary | ICD-10-CM | POA: Diagnosis not present

## 2016-10-29 DIAGNOSIS — Z1239 Encounter for other screening for malignant neoplasm of breast: Secondary | ICD-10-CM

## 2016-10-29 LAB — COMPREHENSIVE METABOLIC PANEL
ALT: 14 U/L (ref 0–35)
AST: 11 U/L (ref 0–37)
Albumin: 4 g/dL (ref 3.5–5.2)
Alkaline Phosphatase: 101 U/L (ref 39–117)
BUN: 10 mg/dL (ref 6–23)
CO2: 27 mEq/L (ref 19–32)
Calcium: 9.7 mg/dL (ref 8.4–10.5)
Chloride: 102 mEq/L (ref 96–112)
Creatinine, Ser: 1.19 mg/dL (ref 0.40–1.20)
GFR: 61.81 mL/min (ref >60.00)
Glucose, Bld: 91 mg/dL (ref 70–99)
Potassium: 4.5 mEq/L (ref 3.5–5.1)
Sodium: 139 mEq/L (ref 135–145)
Total Bilirubin: 0.6 mg/dL (ref 0.2–1.2)
Total Protein: 7.4 g/dL (ref 6.0–8.3)

## 2016-10-29 LAB — LIPID PANEL
Cholesterol: 202 mg/dL — ABNORMAL HIGH (ref 0–200)
HDL: 43.2 mg/dL (ref >39.00)
LDL Cholesterol: 133 mg/dL — ABNORMAL HIGH (ref 0–99)
NonHDL: 159.07
Total CHOL/HDL Ratio: 5
Triglycerides: 129 mg/dL (ref 0.0–149.0)
VLDL: 25.8 mg/dL (ref 0.0–40.0)

## 2016-10-29 NOTE — Progress Notes (Addendum)
HPI:   [name] is a 51 y.o. female, who is here today for her routine physical, she is on disability due to Huntington's disease, 2013.  Regular exercise 3 or more time per week: Not consistently. Following a healthy diet: Yes, trying to do better.She has noted wt loss already.  She lives alone. her son's father/boyfriend lives with her son and checks on her periodically.   Most ADL's and IADL's independent, she can manage her finances. She does not want to drive because jerking movements, which are related to her Huntington's, she is afraid of having episodes while driving. She uses [employer] or her boyfriend take her to the grocery store.  No falls in the past year. Hx of depression.     Mini-Cog - 10/29/16 1205    How many words correct? 2     Depression screen [facility] 2/9 10/29/2016  Decreased Interest 2  Down, Depressed, Hopeless 2  PHQ - 2 Score 4  Altered sleeping 2  Tired, decreased energy 2  Change in appetite 1  Feeling bad or failure about yourself  1  Trouble concentrating 1  Moving slowly or fidgety/restless 2  Suicidal thoughts 0  PHQ-9 Score 13  Some encounter information is confidential and restricted. Go to Review Flowsheets activity to see all data.    Functional Status Survey: Is the patient deaf or have difficulty hearing?: No Does the patient have difficulty seeing, even when wearing glasses/contacts?: No Does the patient have difficulty concentrating, remembering, or making decisions?: Yes Does the patient have difficulty walking or climbing stairs?: No Does the patient have difficulty dressing or bathing?: No Does the patient have difficulty doing errands alone such as visiting a doctor's office or shopping?: Yes (She does not drive)  Chronic medical problems: Huntington's disease,depression,insomnia, vit D deficiency. She has had elevated alk phosphatase in the past. Currently she is on Ergocalciferol 50,000 U weekly. Hx of  fibromyalgia, no myalgias or arthralgias since Cymbalta was increased.   Pap smear: 2-3 years, negative.Per pt report.  Hx of abnormal pap smears: Denies. Hx of STD's : CT many years ago. Sexually active.  M: 15. LMP: None since endometrial ablation in 2016. G:5 L:2 A:3 Her son is 46 and has a 58 yo daughter ,she lives in [place].She communicates regularly with her by phone.  Mammogram: 12/2014 Birads 1 Colonoscopy: She has one in 05/2012, negative,10 years f/u recommended. She has not had an eye exam in years, denies visual problems.   Immunization History  Administered Date(s) Administered  . Influenza Split 03/10/2012  . Tdap 03/10/2012     Providers she sees regularly:  Therapist once monthly, Ms Lorin Mercy. Dr Daron Offer , psych, next appt 11/2016: Depression and insomnia. Dr Krista Blue, neurologists.   She has no concerns today.   Review of Systems  Constitutional: Positive for fatigue. Negative for appetite change, fever and unexpected weight change.  HENT: Negative for dental problem, hearing loss, nosebleeds, trouble swallowing and voice change.   Eyes: Negative for redness and visual disturbance.  Respiratory: Negative for cough, shortness of breath and wheezing.   Cardiovascular: Negative for chest pain and leg swelling.  Gastrointestinal: Negative for abdominal pain, blood in stool, nausea and vomiting.       No changes in bowel habits.  Endocrine: Negative for cold intolerance, heat intolerance, polydipsia, polyphagia and polyuria.  Genitourinary: Negative for decreased urine volume, dyspareunia, dysuria, hematuria, vaginal bleeding and vaginal discharge.       No breast  tenderness or nipple discharge.  Musculoskeletal: Negative for gait problem and myalgias.  Skin: Negative for rash.  Neurological: Negative for seizures, syncope, weakness and headaches.  Hematological: Negative for adenopathy. Does not bruise/bleed easily.  Psychiatric/Behavioral: Positive for sleep  disturbance. Negative for confusion and suicidal ideas. The patient is nervous/anxious.   All other systems reviewed and are negative.    Current Outpatient Prescriptions on File Prior to Visit  Medication Sig Dispense Refill  . diazepam (VALIUM) 2 MG tablet Take 2 mg by mouth every 6 (six) hours as needed for anxiety (ANXIETY).    . DULoxetine (CYMBALTA) 60 MG capsule Take 2 capsules (120 mg total) by mouth daily. 60 capsule 1  . meclizine (ANTIVERT) 25 MG tablet Take 25 mg by mouth 3 (three) times daily as needed for dizziness (DIZZINESS).    [name] risperiDONE (RISPERDAL) 2 MG tablet TAKE 1 TABLET (2 MG TOTAL) BY MOUTH 2 (TWO) TIMES DAILY. 60 tablet 1  . traZODone (DESYREL) 50 MG tablet Take 1 tablet (50 mg total) by mouth at bedtime. 90 tablet 1  . Vitamin D, Ergocalciferol, (DRISDOL) 50000 units CAPS capsule Take 1 capsule (50,000 Units total) by mouth every 7 (seven) days. For 8 weeks, and then every 2 weeks. 12 capsule 1   No current facility-administered medications on file prior to visit.      Past Medical History:  Diagnosis Date  . Acid reflux   . Bleeding gastric ulcer   . Cellulitis   . Cervical back pain with evidence of disc disease   . Depression   . Dysuria   . FATIGUE   . Fibromyalgia   . FLANK PAIN, RIGHT   . GOITER   . HEADACHES, HX OF   . Huntington's disease ([place])   . IBS (irritable bowel syndrome)   . Palpitations   . Post partum depression   . Seasonal allergies   . URTICARIA   . Vertigo   . Vitamin D deficiency    Past Surgical History:  Procedure Laterality Date  . CARDIAC CATHETERIZATION     05/17/12 / waiting on results  . CARTILAGE SURGERY     RT wrist  . CESAREAN SECTION  1988 & 2008   x 2  . CHOLECYSTECTOMY    . DILITATION & CURRETTAGE/HYSTROSCOPY WITH HYDROTHERMAL ABLATION N/A 01/24/2015   Procedure: DILATATION & CURETTAGE/HYSTEROSCOPY WITH HYDROTHERMAL ABLATION;  Surgeon: Shelly Bombard, MD;  Location: [facility];  Service: Gynecology;   Laterality: N/A;  Elberta Fortis will be here  . WISDOM TOOTH EXTRACTION      Allergies  Allergen Reactions  . Ibuprofen Other (See Comments)    "It intensifies my pain." Made pain and symptoms worse. "It intensifies my pain."  . Morphine Itching    Red blotches on skin and burning.  . Morphine And Related Itching and Other (See Comments)    Reaction burns Reaction burns  . Penicillin G Itching  . Penicillins Hives and Itching    Has patient had a PCN reaction causing immediate rash, facial/tongue/throat swelling, SOB or lightheadedness with hypotension: Yes Has patient had a PCN reaction causing severe rash involving mucus membranes or skin necrosis: No Has patient had a PCN reaction that required hospitalization No Has patient had a PCN reaction occurring within the last 10 years: No If all of the above answers are "NO", then may proceed with Cephalosporin use.   . Adhesive [Tape] Itching    Itching and burning at site Itching and burning at site  .  Pseudoephedrine Other (See Comments) and Palpitations    Speed up heart rate Speed up heart rate    Family History  Problem Relation Age of Onset  . Hypertension Mother   . Huntington's disease Mother        deceased Aug 09, 2022  . Diabetes Father   . Hypertension Father   . Heart disease Father   . Scoliosis Sister   . Huntington's disease Sister   . Diabetes Brother   . Hypertension Brother   . Scoliosis Brother   . Skin cancer Maternal Grandmother     Social History   Social History  . Marital status: Single    Spouse name: N/A  . Number of children: 2  . 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 Smoking status: Never Smoker  . Smokeless tobacco: Never Used  . Alcohol use 0.6 oz/week    1 Glasses of wine per week     Comment: occassional  . Drug use: No  . Sexual activity: Not Currently    Birth control/ protection: Surgical      Comment: Tubal Ligation    Other Topics Concern  . None   Social History Narrative   Patient lives at home with a family member. Patient is single.   Disabled.   Education some college.   Left handed.   Caffeine some times not daily.     Vitals:   10/29/16 1354  BP: 102/70  Pulse: 95  Resp: 12   Body mass index is 28.59 kg/m.  O2 sat at RA 97%  Wt Readings from Last 3 Encounters:  10/29/16 199 lb 4 oz (90.4 kg)  09/10/16 204 lb 12.8 oz (92.9 kg)  07/28/16 212 lb 6 oz (96.3 kg)    Physical Exam  Nursing note and vitals reviewed. Constitutional: She is oriented to person, place, and time. She appears well-developed. No distress.  HENT:  Head: Atraumatic.  Right Ear: Hearing, tympanic membrane, external ear and ear canal normal.  Left Ear: Hearing, tympanic membrane, external ear and ear canal normal.  Mouth/Throat: Uvula is midline, oropharynx is clear and moist and mucous membranes are normal.  Eyes: Conjunctivae and EOM are normal. Pupils are equal, round, and reactive to light.  Neck: No tracheal deviation present. No thyromegaly present.  Cardiovascular: Normal rate and regular rhythm.   No murmur heard. Pulses:      Dorsalis pedis pulses are 2+ on the right side, and 2+ on the left side.  Respiratory: Effort normal and breath sounds normal. No respiratory distress.  GI: Soft. She exhibits no mass. There is no hepatomegaly. There is no tenderness. Hernia confirmed negative in the right inguinal area and confirmed negative in the left inguinal area.  Genitourinary: Vagina normal. No breast swelling or tenderness. There is no tenderness or lesion on the right labia. There is no tenderness or lesion on the left labia. Uterus is not enlarged and not tender. Cervix exhibits no motion tenderness, no discharge and no friability. Right adnexum displays no mass and no tenderness. Left adnexum displays no mass and no tenderness.  Genitourinary Comments: Breast: No masses, skin  changes,or nipple discharge. Pap smear collected.  Musculoskeletal: She exhibits no edema.  No major deformity or signs of synovitis appreciated.  Lymphadenopathy:    She has no cervical adenopathy.    She has no axillary adenopathy.       Right: No inguinal  and no supraclavicular adenopathy present.       Left: No inguinal and no supraclavicular adenopathy present.  Neurological: She is alert and oriented to person, place, and time. She has normal strength. No cranial nerve deficit. Gait normal.  Reflex Scores:      Bicep reflexes are 2+ on the right side and 2+ on the left side.      Patellar reflexes are 2+ on the right side and 2+ on the left side. Skin: Skin is warm. No rash noted. No erythema.  Psychiatric: She has a normal mood and affect. Her speech is normal. Judgment normal. She is slowed.  Well groomed, good eye contact. Rocking movements, back and fourth while sitting on chair.    ASSESSMENT AND PLAN:   Idil was seen today for awv and annual exam.  Diagnoses and all orders for this visit:  Routine general medical examination at a health care facility   We discussed the importance of regular physical activity and healthy diet for prevention of chronic illness and/or complications. Preventive guidelines reviewed. She requested STD screening. Vaccination up to date. Will consider pneumovax next year. Fall prevention. Ca++ and vit D supplementation recommended,Ca++ ideally through her diet. End of life arrangements have not been made.Strongly recommended to do so. Cognitive exam otherwise intact based on observation and interaction during visit. She remembered 2/3 objects but oriented x 3, fluent language, answers questions appropriately.     See scanned Medicare questionnaire. Hx of depression, she is seeing Dr.Eksir (psychiatrists) and Ms Lorin Mercy (psychotherapists).  Next CPE in 1 year.  -     Mammogram Digital Screening; Future  Lipid screening -     Lipid  panel  Vitamin D deficiency  No changes in current management, will follow labs done today and will give further recommendations accordingly. F/U in 6-12 months.  -     Comprehensive metabolic panel -     VITAMIN D 25 Hydroxy (Vit-D Deficiency, Fractures) -     Parathyroid hormone, intact (no Ca)  Elevated alkaline phosphatase level  Further recommendations will be given according to lab results.  -     Comprehensive metabolic panel  Encounter for screening for HIV -     HIV antibody (with reflex)  Cervical cancer screening -     PAP []  Breast cancer screening -     Mammogram Digital Screening; Future   Return in about 1 year (around 10/29/2017) for routine. Medicare with Manuela Schwartz. Same day if possible.          Evalynne Locurto G. Martinique, MD  [facility]. [place] office.

## 2016-10-29 NOTE — Patient Instructions (Addendum)
A few things to remember from today's visit:   Lipid screening - Plan: Lipid panel  Vitamin D deficiency - Plan: Comprehensive metabolic panel, VITAMIN D 25 Hydroxy (Vit-D Deficiency, Fractures), Parathyroid hormone, intact (no Ca)  Elevated alkaline phosphatase level - Plan: Comprehensive metabolic panel  Encounter for screening for HIV - Plan: HIV antibody (with reflex)    At least 150 minutes of moderate exercise per week, daily brisk walking for 15-30 min is a good exercise option. Healthy diet low in saturated (animal) fats and sweets and consisting of fresh fruits and vegetables, lean meats such as fish and white chicken and whole grains.   - Vaccines:  Tdap vaccine every 10 years.  Shingles vaccine recommended at age 50, could be given after 50 years of age but not sure about insurance coverage.  Pneumonia vaccines:  Prevnar 13 at 65 and Pneumovax at 66.  Screening recommendations for low/normal risk women:  Screening for diabetes at age 50-45 and every 3 years.  Cervical cancer prevention:  -HPV vaccination between 689-50 years old. -Pap smear starts at 50 years of age and continues periodically until 50 years old in low risk women. Pap smear every 3 years between 2121 and 50 years old. Pap smear every 3 years between women 30 and older if pap smear negative and HPV screening negative.   -Breast cancer: Mammogram: There is disagreement between experts about when to start screening in low risk asymptomatic female but recent recommendations are to start screening at 5240 and not later than 50 years old , every 1-2 years and after 50 yo q 2 years. Screening is recommended until 50 years old but some women can continue screening depending of healthy issues.   Colon cancer screening: starts at 50 years old until 50 years old.  Cholesterol disorder screening at age 50 and every 3 years.  Also recommended:  1. Dental visit- Brush and floss your teeth twice daily; visit your  dentist twice a year. 2. Eye doctor- Get an eye exam at least every 2 years. 3. Helmet use- Always wear a helmet when riding a bicycle, motorcycle, rollerblading or skateboarding. 4. Safe sex- If you may be exposed to sexually transmitted infections, use a condom. 5. Seat belts- Seat belts can save your live; always wear one. 6. Smoke/Carbon Monoxide detectors- These detectors need to be installed on the appropriate level of your home. Replace batteries at least once a year. 7. Skin cancer- When out in the sun please cover up and use sunscreen 15 SPF or higher. 8. Violence- If anyone is threatening or hurting you, please tell your healthcare provider.  9. Drink alcohol in moderation- Limit alcohol intake to one drink or less per day. Never drink and drive.  Please be sure medication list is accurate. If a new problem present, please set up appointment sooner than planned today.

## 2016-10-30 LAB — HIV ANTIBODY (ROUTINE TESTING W REFLEX): HIV 1&2 Ab, 4th Generation: NONREACTIVE

## 2016-11-01 LAB — CYTOLOGY - PAP
Chlamydia: NEGATIVE
Diagnosis: NEGATIVE
Neisseria Gonorrhea: NEGATIVE
Trichomonas: NEGATIVE

## 2016-11-01 LAB — VITAMIN D 25 HYDROXY (VIT D DEFICIENCY, FRACTURES): VITD: 47.04 ng/mL (ref 30.00–100.00)

## 2016-11-01 LAB — PARATHYROID HORMONE, INTACT (NO CA): PTH: 77 pg/mL — ABNORMAL HIGH (ref 14–64)

## 2016-11-04 ENCOUNTER — Ambulatory Visit (INDEPENDENT_AMBULATORY_CARE_PROVIDER_SITE_OTHER): Payer: Medicare HMO | Admitting: Psychology

## 2016-11-04 DIAGNOSIS — F331 Major depressive disorder, recurrent, moderate: Secondary | ICD-10-CM

## 2016-11-04 DIAGNOSIS — F329 Major depressive disorder, single episode, unspecified: Secondary | ICD-10-CM | POA: Diagnosis not present

## 2016-11-04 NOTE — Progress Notes (Signed)
   THERAPIST PROGRESS NOTE  Session Time: 3.30pm-4.15pm  Participation Level: Active  Behavioral Response: Well GroomedAlertaffect brighter  Type of Therapy: Individual Therapy  Treatment Goals addressed: Diagnosis: MDd and goal 1.  Interventions: CBT and Supportive  Summary: Leonilda Lewie LoronMichelle Moret is a 51 y.o. female who presents with affect brighter.  Pt reports that her depression is improved some and improving w/ more awareness for positive self talk.  Pt reported that her doctor's feel that she is doing better w/her Huntington's then was.  Pt discussed concern for her daughter and that she will not go get tested and is avoiding.  Pt discussed her supports and those she can talk w/ about her disease.  Pt reported [facility] w/ Huntington's and is trying to f/u w/ this.   Suicidal/Homicidal: Nowithout intent/plan  Therapist Response: ASsessed pt current functioning per pt report.  Processed w/pt her self talk and ways to continue developing and becoming aware of self talk- treating self w/ self compassion and forgiveness.  Discussed pt support system and her concern for her daughter.   Plan: Return again in 3 weeks.  Diagnosis: MDD    Forde RadonYATES,Alysse Rathe, [facility] 11/04/2016

## 2016-11-08 ENCOUNTER — Other Ambulatory Visit: Payer: Self-pay | Admitting: Family Medicine

## 2016-11-08 DIAGNOSIS — E559 Vitamin D deficiency, unspecified: Secondary | ICD-10-CM

## 2016-11-25 ENCOUNTER — Ambulatory Visit (HOSPITAL_COMMUNITY): Payer: Self-pay | Admitting: Psychology

## 2016-12-01 ENCOUNTER — Ambulatory Visit (HOSPITAL_COMMUNITY): Payer: Self-pay | Admitting: Psychiatry

## 2016-12-09 ENCOUNTER — Telehealth (HOSPITAL_COMMUNITY): Payer: Self-pay

## 2016-12-09 DIAGNOSIS — M797 Fibromyalgia: Secondary | ICD-10-CM

## 2016-12-09 DIAGNOSIS — F331 Major depressive disorder, recurrent, moderate: Secondary | ICD-10-CM

## 2016-12-09 DIAGNOSIS — G4700 Insomnia, unspecified: Secondary | ICD-10-CM

## 2016-12-09 NOTE — Telephone Encounter (Signed)
Patient calling because she needs refills on her medications, she was a new patient in March and did not come to her follow up due to transportation issues. [place] med[place], she has an appointment with you on 8/21 - please review and advise, thank you

## 2016-12-09 NOTE — Telephone Encounter (Signed)
That's fine we can send refill for 30 days of her meds. Thank you!

## 2016-12-10 MED ORDER — TRAZODONE HCL 50 MG PO TABS: 50.0000 mg | ORAL_TABLET | Freq: Every day | ORAL | 0 refills | Status: DC

## 2016-12-10 MED ORDER — DULOXETINE HCL 60 MG PO CPEP: 120.0000 mg | ORAL_CAPSULE | Freq: Every day | ORAL | 0 refills | Status: DC

## 2016-12-10 NOTE — Telephone Encounter (Signed)
I sent a one month supply to the pharmacy - I called the patient and let her know

## 2016-12-15 ENCOUNTER — Ambulatory Visit: Payer: Medicare HMO | Admitting: Neurology

## 2016-12-15 ENCOUNTER — Ambulatory Visit (HOSPITAL_COMMUNITY): Payer: Self-pay | Admitting: Psychology

## 2016-12-27 ENCOUNTER — Other Ambulatory Visit (HOSPITAL_COMMUNITY): Payer: Self-pay

## 2016-12-27 DIAGNOSIS — F331 Major depressive disorder, recurrent, moderate: Secondary | ICD-10-CM

## 2016-12-27 DIAGNOSIS — G4700 Insomnia, unspecified: Secondary | ICD-10-CM

## 2016-12-27 DIAGNOSIS — M797 Fibromyalgia: Secondary | ICD-10-CM

## 2016-12-27 MED ORDER — DULOXETINE HCL 60 MG PO CPEP: 120.0000 mg | ORAL_CAPSULE | Freq: Every day | ORAL | 1 refills | Status: DC

## 2016-12-27 MED ORDER — TRAZODONE HCL 50 MG PO TABS: 50.0000 mg | ORAL_TABLET | Freq: Every day | ORAL | 1 refills | Status: DC

## 2016-12-28 ENCOUNTER — Ambulatory Visit (HOSPITAL_COMMUNITY): Payer: Self-pay | Admitting: Psychology

## 2016-12-28 ENCOUNTER — Ambulatory Visit (HOSPITAL_COMMUNITY): Payer: Medicare HMO | Admitting: Psychiatry

## 2017-01-05 ENCOUNTER — Other Ambulatory Visit (HOSPITAL_COMMUNITY): Payer: Self-pay | Admitting: Psychiatry

## 2017-01-05 DIAGNOSIS — M797 Fibromyalgia: Secondary | ICD-10-CM

## 2017-01-05 DIAGNOSIS — F331 Major depressive disorder, recurrent, moderate: Secondary | ICD-10-CM

## 2017-01-14 ENCOUNTER — Encounter: Payer: Self-pay | Admitting: Family Medicine

## 2017-01-26 ENCOUNTER — Telehealth (HOSPITAL_COMMUNITY): Payer: Self-pay | Admitting: Psychiatry

## 2017-01-31 ENCOUNTER — Telehealth (HOSPITAL_COMMUNITY): Payer: Self-pay | Admitting: Psychology

## 2017-02-16 ENCOUNTER — Ambulatory Visit (HOSPITAL_COMMUNITY): Payer: Medicare HMO | Admitting: Psychiatry

## 2017-02-22 ENCOUNTER — Telehealth (HOSPITAL_COMMUNITY): Payer: Self-pay

## 2017-02-22 NOTE — Telephone Encounter (Signed)
Patient called for a refill on her medication, we have not seen her since March. I told patient that when you gave her a refill in August you put a note in stating that she needs to see a PCP or Psychiatrist in [place] and no more refills from you. Patient failed to follow up and is now out of medication. I told patient I could not authorize a refill without your permission, but that since it had been more that 6 months since we saw her, it was unlikely.

## 2017-02-23 NOTE — Telephone Encounter (Signed)
Correct, she needs to call her pcp for a refill

## 2017-03-15 ENCOUNTER — Telehealth: Payer: Self-pay | Admitting: Family Medicine

## 2017-03-15 NOTE — Telephone Encounter (Signed)
FYI pt calling stating [place] just wanted the office to know.

## 2017-03-15 NOTE — Telephone Encounter (Signed)
FYI

## 2017-03-16 ENCOUNTER — Telehealth: Payer: Self-pay | Admitting: Neurology

## 2017-03-16 NOTE — Telephone Encounter (Signed)
FYI-Patient is calling to advise [place] moved [place].

## 2017-03-16 NOTE — Telephone Encounter (Signed)
noted 

## 2017-04-08 ENCOUNTER — Encounter: Payer: Self-pay | Admitting: Family Medicine

## 2017-05-14 ENCOUNTER — Encounter (HOSPITAL_COMMUNITY): Payer: Self-pay | Admitting: Psychology

## 2017-10-10 NOTE — Progress Notes (Signed)
Katherine Gregory is a 52 y.o. female patient who is discharged from counseling as last seen on 11/04/16.  Outpatient Therapist Discharge Summary  Katherine Gregory    05-17-1965   Admission Date: 08/19/16   Discharge Date:  05/13/16 Reason for Discharge:  Not active Diagnosis: MDD   Comments:  Pt may return as needed.   [name]          [name], LPC

## 2018-04-07 LAB — HM MAMMOGRAPHY

## 2019-05-29 ENCOUNTER — Ambulatory Visit (INDEPENDENT_AMBULATORY_CARE_PROVIDER_SITE_OTHER): Payer: Medicare HMO | Admitting: Nurse Practitioner

## 2019-05-29 ENCOUNTER — Encounter: Payer: Self-pay | Admitting: Nurse Practitioner

## 2019-05-29 ENCOUNTER — Other Ambulatory Visit: Payer: Self-pay

## 2019-05-29 VITALS — BP 132/88 | HR 92 | Temp 98.9°F | Ht 68.0 in | Wt 195.8 lb

## 2019-05-29 DIAGNOSIS — Z13228 Encounter for screening for other metabolic disorders: Secondary | ICD-10-CM

## 2019-05-29 DIAGNOSIS — M797 Fibromyalgia: Secondary | ICD-10-CM | POA: Diagnosis not present

## 2019-05-29 DIAGNOSIS — F331 Major depressive disorder, recurrent, moderate: Secondary | ICD-10-CM

## 2019-05-29 DIAGNOSIS — R232 Flushing: Secondary | ICD-10-CM | POA: Diagnosis not present

## 2019-05-29 DIAGNOSIS — E559 Vitamin D deficiency, unspecified: Secondary | ICD-10-CM

## 2019-05-29 DIAGNOSIS — G10 Huntington's disease: Secondary | ICD-10-CM | POA: Diagnosis not present

## 2019-05-29 DIAGNOSIS — R6889 Other general symptoms and signs: Secondary | ICD-10-CM

## 2019-05-29 MED ORDER — DULOXETINE HCL 30 MG PO CPEP: ORAL_CAPSULE | ORAL | 3 refills | Status: DC

## 2019-05-29 NOTE — Progress Notes (Signed)
This visit occurred during the SARS-CoV-2 public health emergency.  Safety protocols were in place, including screening questions prior to the visit, additional usage of staff PPE, and extensive cleaning of exam room while observing appropriate contact time as indicated for disinfecting solutions.  Subjective:     Patient ID: Katherine Gregory , female    DOB: 30-Jan-1966 , 54 y.o.   MRN: [id number]   Chief Complaint  Patient presents with  . Establish Care    HPI  Here to establish care - she was living in [place] and has recently returned home.  Dr. Estanislado Spire had been in the area for 2 years. Single.  2 children - healthy.  Neurologist, primary care, rheumatologist and psychiatrist.   PMH - Huntington's disease (diagnosed 5 years), depression, previous history what was thought to be fibromyalgia.   She had an ablation done few years ago, hot flashes, history of enlarged liver and cholesterol was elevated.    [facility] - mother huntington's (deceased), stroke and hypertension. Father - hypertension, heart issues and diabetes. Sister - huntington's and brother - diabetes.   Memory, leg and hand movement does not drive due to this reason.  She is notice every once in a while her head is bobbing. She is having chorea again.    She skipped her PAP and mammogram last year.      Past Medical History:  Diagnosis Date  . Acid reflux   . Bleeding gastric ulcer   . Cellulitis   . Cervical back pain with evidence of disc disease   . Depression   . Dysuria   . FATIGUE   . Fibromyalgia   . FLANK PAIN, RIGHT   . GOITER   . HEADACHES, HX OF   . Huntington's disease ([place])   . IBS (irritable bowel syndrome)   . Palpitations   . Post partum depression   . Seasonal allergies   . URTICARIA   . Vertigo   . Vitamin D deficiency      Family History  Problem Relation Age of Onset  . Hypertension Mother   . Huntington's disease Mother        deceased 08/05/22  . Diabetes Father    . Hypertension Father   . Heart disease Father   . Scoliosis Sister   . Huntington's disease Sister   . Diabetes Brother   . Hypertension Brother   . Scoliosis Brother   . Skin cancer Maternal Grandmother      Current Outpatient Medications:  .  B Complex Vitamins (VITAMIN B COMPLEX PO), Take by mouth. Take 1 tablet daily, Disp: , Rfl:  .  Calcium Carb-Cholecalciferol (CALCIUM 1000 + D) 1000-800 MG-UNIT TABS, Take by mouth. Take 1 tablet daily, Disp: , Rfl:  .  Cholecalciferol (VITAMIN D3) 50 MCG (2000 UT) TABS, Take by mouth. Take 1 tablet daily, Disp: , Rfl:  .  DULoxetine (CYMBALTA) 60 MG capsule, TAKE 2 CAPSULES BY MOUTH EVERY DAY (Patient taking differently: 30 mg 2 (two) times daily. ), Disp: 60 capsule, Rfl: 0 .  Multiple Vitamin (MULTIVITAMIN ADULT PO), Take by mouth. Take 1 tablet daily, Disp: , Rfl:  .  tetrabenazine (XENAZINE) 12.5 MG tablet, Take 12.5 mg by mouth 2 (two) times daily., Disp: , Rfl:  .  diazepam (VALIUM) 2 MG tablet, Take 2 mg by mouth every 6 (six) hours as needed for anxiety (ANXIETY)., Disp: , Rfl:  .  omeprazole (PRILOSEC) 40 MG capsule, Take 40 mg by mouth daily.,  Disp: , Rfl:    Allergies  Allergen Reactions  . Ibuprofen Other (See Comments)    "It intensifies my pain." Made pain and symptoms worse. "It intensifies my pain."  . Morphine Itching    Red blotches on skin and burning.  . Morphine And Related Itching and Other (See Comments)    Reaction burns Reaction burns  . Penicillin G Itching  . Penicillins Hives and Itching    Has patient had a PCN reaction causing immediate rash, facial/tongue/throat swelling, SOB or lightheadedness with hypotension: Yes Has patient had a PCN reaction causing severe rash involving mucus membranes or skin necrosis: No Has patient had a PCN reaction that required hospitalization No Has patient had a PCN reaction occurring within the last 10 years: No If all of the above answers are "NO", then may proceed with  Cephalosporin use.   . Adhesive [Tape] Itching    Itching and burning at site Itching and burning at site  . Pseudoephedrine Other (See Comments) and Palpitations    Speed up heart rate Speed up heart rate     Review of Systems  Constitutional: Negative.   Respiratory: Negative.  Negative for cough.   Cardiovascular: Negative.  Negative for chest pain, palpitations and leg swelling.  Musculoskeletal:       She has intermittent spasms  Skin: Negative.   Neurological: Negative for dizziness and headaches.  Psychiatric/Behavioral: Negative.      Today's Vitals   05/29/19 1119  BP: 132/88  Pulse: 92  Temp: 98.9 F (37.2 C)  TempSrc: Oral  SpO2: 97%  Weight: 195 lb 12.8 oz (88.8 kg)  Height: 5' 8"  (1.727 m)  PainSc: 0-No pain   Body mass index is 29.77 kg/m.   Objective:  Physical Exam Constitutional:      General: She is not in acute distress.    Appearance: Normal appearance.  Cardiovascular:     Rate and Rhythm: Normal rate and regular rhythm.     Pulses: Normal pulses.     Heart sounds: Normal heart sounds. No murmur.  Pulmonary:     Effort: Pulmonary effort is normal. No respiratory distress.     Breath sounds: Normal breath sounds.  Skin:    General: Skin is warm and dry.     Capillary Refill: Capillary refill takes less than 2 seconds.  Neurological:     General: No focal deficit present.     Mental Status: She is alert and oriented to person, place, and time.     Cranial Nerves: No cranial nerve deficit.  Psychiatric:        Mood and Affect: Mood normal.        Behavior: Behavior normal.        Thought Content: Thought content normal.        Judgment: Judgment normal.         Assessment And Plan:     1. Moderate episode of recurrent major depressive disorder (HCC)  Would like to be set up with psychiatry due to her previous history with her depression - Ambulatory referral to Psychiatry - CMP14+EGFR - DULoxetine (CYMBALTA) 30 MG capsule; Take  2 tabs by mouth daily  Dispense: 60 capsule; Refill: 3  2. Fibromyalgia  Chronic, she is stable  3. Huntington's disease ([place])  Will refer to Neurology and CCM for disease progression  I did review Dr Rhea Belton last note - Ambulatory referral to Neurology - Referral to Chronic Care Management Services  4. Hot flashes  She  may be in menopause vs Huntington's response  Encouraged to limit intake of sugary and high gluten foods.   5. Vitamin D deficiency  Will check vitamin D level and supplement as needed.     Also encouraged to spend 15 minutes in the sun daily.   6. Encounter for screening for metabolic disorder  - Hemoglobin A1c - Lipid panel  7. Forgetfulness This may be related to her Huntington's however I will check her vitamin B12 today - Vitamin B12   [name], FNP    THE PATIENT IS ENCOURAGED TO PRACTICE SOCIAL DISTANCING DUE TO THE COVID-19 PANDEMIC.

## 2019-05-30 ENCOUNTER — Ambulatory Visit: Payer: Self-pay

## 2019-05-30 DIAGNOSIS — F331 Major depressive disorder, recurrent, moderate: Secondary | ICD-10-CM

## 2019-05-30 DIAGNOSIS — G10 Huntington's disease: Secondary | ICD-10-CM

## 2019-05-30 DIAGNOSIS — M797 Fibromyalgia: Secondary | ICD-10-CM

## 2019-05-30 LAB — CMP14+EGFR
ALT: 11 IU/L (ref 0–32)
AST: 15 IU/L (ref 0–40)
Albumin/Globulin Ratio: 1.3 (ref 1.2–2.2)
Albumin: 4.3 g/dL (ref 3.8–4.9)
Alkaline Phosphatase: 148 IU/L — ABNORMAL HIGH (ref 39–117)
BUN/Creatinine Ratio: 10 (ref 9–23)
BUN: 11 mg/dL (ref 6–24)
Bilirubin Total: 0.5 mg/dL (ref 0.0–1.2)
CO2: 23 mmol/L (ref 20–29)
Calcium: 9.8 mg/dL (ref 8.7–10.2)
Chloride: 104 mmol/L (ref 96–106)
Creatinine, Ser: 1.11 mg/dL — ABNORMAL HIGH (ref 0.57–1.00)
GFR calc Af Amer: 66 mL/min/{1.73_m2} (ref >59)
GFR calc non Af Amer: 57 mL/min/{1.73_m2} — ABNORMAL LOW (ref >59)
Globulin, Total: 3.4 g/dL (ref 1.5–4.5)
Glucose: 84 mg/dL (ref 65–99)
Potassium: 4.6 mmol/L (ref 3.5–5.2)
Sodium: 141 mmol/L (ref 134–144)
Total Protein: 7.7 g/dL (ref 6.0–8.5)

## 2019-05-30 LAB — VITAMIN B12: Vitamin B-12: 453 pg/mL (ref 232–1245)

## 2019-05-30 LAB — LIPID PANEL
Chol/HDL Ratio: 3.5 ratio (ref 0.0–4.4)
Cholesterol, Total: 230 mg/dL — ABNORMAL HIGH (ref 100–199)
HDL: 65 mg/dL (ref >39)
LDL Chol Calc (NIH): 146 mg/dL — ABNORMAL HIGH (ref 0–99)
Triglycerides: 110 mg/dL (ref 0–149)
VLDL Cholesterol Cal: 19 mg/dL (ref 5–40)

## 2019-05-30 LAB — HEMOGLOBIN A1C
Est. average glucose Bld gHb Est-mCnc: 108 mg/dL
Hgb A1c MFr Bld: 5.4 % (ref 4.8–5.6)

## 2019-05-30 NOTE — Chronic Care Management (AMB) (Signed)
  Chronic Care Management   Outreach Note  05/30/2019 Name: Katherine Gregory MRN: [id number] DOB: 13-Oct-1965  Referred by: Arnette Felts FNP Reason for referral : Care Coordination   An unsuccessful telephone outreach was attempted today. The patient was referred to the case management team by for assistance with care management and care coordination.   Follow Up Plan: A HIPPA compliant phone message was left for the patient providing contact information and requesting a return call.  The care management team will reach out to the patient again over the next 14 days.   Bevelyn Ngo, BSW, CDP Social Worker, Certified Dementia Practitioner [facility] Care Management [phone]

## 2019-05-31 ENCOUNTER — Other Ambulatory Visit: Payer: Self-pay

## 2019-05-31 MED ORDER — OMEPRAZOLE 40 MG PO CPDR: 40.0000 mg | DELAYED_RELEASE_CAPSULE | Freq: Every day | ORAL | 1 refills | Status: DC

## 2019-06-05 ENCOUNTER — Ambulatory Visit: Payer: Self-pay

## 2019-06-05 DIAGNOSIS — G10 Huntington's disease: Secondary | ICD-10-CM

## 2019-06-05 NOTE — Chronic Care Management (AMB) (Signed)
  Chronic Care Management   Outreach Note  06/05/2019 Name: Katherine Gregory MRN: [id number] DOB: [birth year]  Referred by: [place], Betty G, MD Reason for referral : Care Coordination   A second unsuccessful telephone outreach was attempted today. The patient was referred to the case management team for assistance with care management and care coordination.   Follow Up Plan: A HIPPA compliant phone message was left for the patient providing contact information and requesting a return call.  The care management team will reach out to the patient again over the next 14 days.   Bevelyn Ngo, BSW, CDP Social Worker, Certified Dementia Practitioner [facility] Care Management [phone]

## 2019-06-11 ENCOUNTER — Ambulatory Visit: Payer: Self-pay

## 2019-06-11 DIAGNOSIS — G10 Huntington's disease: Secondary | ICD-10-CM

## 2019-06-11 NOTE — Chronic Care Management (AMB) (Signed)
  Chronic Care Management   Outreach Note  06/11/2019 Name: Katherine Gregory MRN: [id number] DOB: May 28, 1965  Referred by: [place], Betty G, MD Reason for referral : Care Coordination   Third unsuccessful telephone outreach was attempted today. The patient was referred to the case management team for assistance with care management and care coordination. The patient's primary care provider has been notified of our unsuccessful attempts to make or maintain contact with the patient. The care management team is pleased to engage with this patient at any time in the future should he/she be interested in assistance from the care management team.    Bevelyn Ngo, BSW, CDP Social Worker, Certified Dementia Practitioner [facility] Care Management [phone]

## 2019-06-12 ENCOUNTER — Ambulatory Visit (INDEPENDENT_AMBULATORY_CARE_PROVIDER_SITE_OTHER): Payer: Medicare HMO

## 2019-06-12 ENCOUNTER — Ambulatory Visit: Payer: Self-pay

## 2019-06-12 ENCOUNTER — Telehealth: Payer: Self-pay

## 2019-06-12 ENCOUNTER — Other Ambulatory Visit: Payer: Self-pay

## 2019-06-12 DIAGNOSIS — G10 Huntington's disease: Secondary | ICD-10-CM

## 2019-06-12 DIAGNOSIS — F331 Major depressive disorder, recurrent, moderate: Secondary | ICD-10-CM

## 2019-06-12 NOTE — Chronic Care Management (AMB) (Signed)
Chronic Care Management    Social Work General Note  06/12/2019 Name: Katherine Gregory MRN: [id number] DOB: May 20, 1965  Katherine Gregory is a 54 y.o. year old female who is a primary care patient of [place], Malka So, MD. The CCM was consulted to assist the patient with care coordination.   SW received a voice message from the patient in response to previous unsuccessful call attempts. SW spoke with the patient to introduce care management program.  Katherine Gregory was given information about Chronic Care Management services today including:  1. CCM service includes personalized support from designated clinical staff supervised by her physician, including individualized plan of care and coordination with other care providers 2. 24/7 contact phone numbers for assistance for urgent and routine care needs. 3. Service will only be billed when office clinical staff spend 20 minutes or more in a month to coordinate care. 4. Only one practitioner may furnish and bill the service in a calendar month. 5. The patient may stop CCM services at any time (effective at the end of the month) by phone call to the office staff. 6. The patient will be responsible for cost sharing (co-pay) of up to 20% of the service fee (after annual deductible is met).  Patient agreed to services and verbal consent obtained.   Review of patient status, including review of consultants reports, relevant laboratory and other test results, and collaboration with appropriate care team members and the patient's provider was performed as part of comprehensive patient evaluation and provision of chronic care management services.    SDOH (Social Determinants of Health) screening performed today. See Care Plan Entry related to challenges with: Transportation Food Insecurity   Outpatient Encounter Medications as of 06/12/2019  Medication Sig  . B Complex Vitamins (VITAMIN B COMPLEX PO) Take by mouth. Take 1 tablet daily  .  Calcium Carb-Cholecalciferol (CALCIUM 1000 + D) 1000-800 MG-UNIT TABS Take by mouth. Take 1 tablet daily  . Cholecalciferol (VITAMIN D3) 50 MCG (2000 UT) TABS Take by mouth. Take 1 tablet daily  . diazepam (VALIUM) 2 MG tablet Take 2 mg by mouth every 6 (six) hours as needed for anxiety (ANXIETY).  . DULoxetine (CYMBALTA) 30 MG capsule Take 2 tabs by mouth daily  . Multiple Vitamin (MULTIVITAMIN ADULT PO) Take by mouth. Take 1 tablet daily  . omeprazole (PRILOSEC) 40 MG capsule Take 1 capsule (40 mg total) by mouth daily.  [name] tetrabenazine (XENAZINE) 12.5 MG tablet Take 12.5 mg by mouth 2 (two) times daily.   No facility-administered encounter medications on file as of 06/12/2019.    Goals Addressed            This Visit's Progress   . Assist with care coordination needs related to social determinants of health       Current Barriers:  . Huntington's Disease impacting ability to perform iADL's . Limited social support . Lacks knowledge of local community resources to assist with transportation . Limited income resulting in food insecurity  Social Work Clinical Goal(s):  [name] Over the next 45 days the patient will become more knowledgeable of local food pantry's to assist with food insecurity . Over the next 45 days the patient will work with SW to become more knowledgeable of transportation resources . Over the next 60 days the patient will work with SW to complete and submit a SCAT application  CCM SW Interventions: Completed 06/12/19 . Outbound call placed to the patient to complete SDOH )Social Determinants of Health) screening.  o  Identified challenges with food insecurity and transportation . Food Insecurity: o Assessed for patient ability to qualify for assistance under food and nutrition services- the patient reports she is currently receiving $19.00 per month in assistance o Educated the patient on local food pantry's to assist with food insecurities o Mailed "The [place]" to the patients home for review . Transportation o Determined the patient is currently relying on her daughter for transportation assistance but this is not a long term plan o Educated the patient on her [place] transportation benefit - Discussed concern this benefit would not offer enough trips per year given the amount of appointments the patient may have - Mailed information on this benefit to the patients home for review o Educated the patient on SCAT transportation resource for [place] transportation needs - Assisted with completion of Part A SCAT application - Collaboration with Katherine Brine, FNP regarding Part B completion o Assessed for upcoming acute transportation needs - The patient has a neurology appointment on February 9 to see Katherine Gregory- the patient is unsure if her daughter will be able to assist  - Advised the patient to speak with her daughter and contact SW directly if assistance is needed with transportation by Wednesday 06/13/19 to account for 3 business day requirement to arrange under [employer] benefit- patient stated understanding . Scheduled follow up call to assist with on going care coordination needs . Communication with RN Case Manager regarding enrollment  Patient Self Care Activities:  . Patient verbalizes understanding of plan to work with CM team to address care coordination needs . Self administers medications as prescribed . Attends all scheduled provider appointments . Calls pharmacy for medication refills  Initial goal documentation         Follow Up Plan: SW will follow up with patient by phone over the next 14 days.       Daneen Schick, BSW, CDP Social Worker, Certified Dementia Practitioner [place] [facility] [phone]  Total time spent performing care coordination and/or care management activities with the patient by phone or face to face = 28 minutes.

## 2019-06-12 NOTE — Chronic Care Management (AMB) (Signed)
  Chronic Care Management   Initial Visit Note  06/12/2019 Name: Johnathon Mittal MRN: [id number] DOB: 09/05/1965  Referred by: [place], Betty G, MD Reason for referral : Chronic Care Management (CCM RNCM Case Collaboration )   Noa Roy Snuffer is a 54 y.o. year old female who is a primary care patient of [place], Timoteo Expose, MD. The care management team was consulted for assistance with chronic disease management and care coordination needs related to Depression and Huntington's Disease.  Review of patient status, including review of consultants reports, relevant laboratory and other test results, and collaboration with appropriate care team members and the patient's provider was performed as part of comprehensive patient evaluation and provision of chronic care management services.    I initiated and established the plan of care for [employer] during one on one collaboration with my clinical care management colleague Bevelyn Ngo BSW who is also engaged with this patient to address social work needs.   Outpatient Encounter Medications as of 06/12/2019  Medication Sig  . B Complex Vitamins (VITAMIN B COMPLEX PO) Take by mouth. Take 1 tablet daily  . Calcium Carb-Cholecalciferol (CALCIUM 1000 + D) 1000-800 MG-UNIT TABS Take by mouth. Take 1 tablet daily  . Cholecalciferol (VITAMIN D3) 50 MCG (2000 UT) TABS Take by mouth. Take 1 tablet daily  . diazepam (VALIUM) 2 MG tablet Take 2 mg by mouth every 6 (six) hours as needed for anxiety (ANXIETY).  . DULoxetine (CYMBALTA) 30 MG capsule Take 2 tabs by mouth daily  . Multiple Vitamin (MULTIVITAMIN ADULT PO) Take by mouth. Take 1 tablet daily  . omeprazole (PRILOSEC) 40 MG capsule Take 1 capsule (40 mg total) by mouth daily.  [name] tetrabenazine (XENAZINE) 12.5 MG tablet Take 12.5 mg by mouth 2 (two) times daily.   No facility-administered encounter medications on file as of 06/12/2019.     Goals Addressed    . Assist with Chronic  Care Management and Care Coordination       Current Barriers:  [name] Knowledge Barriers related to resources and support available to address needs related to Chronic Care Management and Community Resources   Case Manager Clinical Goal(s):  [name] Over the next 30 days, patient will work with the CCM team to address needs related to Chronic disease management and Care Coordination   Interventions:  . Collaborated with BSW and initiated plan of care to address needs related to Chronic Care Management and Community Resources  Patient Self Care Activities:  . Self administers medications as prescribed . Attends all scheduled provider appointments . Calls pharmacy for medication refills . Calls provider office for new concerns or questions  Initial goal documentation         Telephone follow up appointment with care management team member scheduled for:07/04/19  Delsa Sale, RN, BSN, CCM Care Management Coordinator [facility] Care Management/[facility]  Direct Phone: [phone]

## 2019-06-12 NOTE — Patient Instructions (Signed)
Social Worker Visit Information  Goals we discussed today:  Goals Addressed            This Visit's Progress   . Assist with care coordination needs related to social determinants of health       Current Barriers:  . Huntington's Disease impacting ability to perform iADL's . Limited social support . Lacks knowledge of local community resources to assist with transportation . Limited income resulting in food insecurity  Social Work Clinical Goal(s):  [name] Over the next 45 days the patient will become more knowledgeable of local food pantry's to assist with food insecurity . Over the next 45 days the patient will work with SW to become more knowledgeable of transportation resources . Over the next 60 days the patient will work with SW to complete and submit a SCAT application  CCM SW Interventions: Completed 06/12/19 . Outbound call placed to the patient to complete SDOH )Social Determinants of Health) screening. o  Identified challenges with food insecurity and transportation . Food Insecurity: o Assessed for patient ability to qualify for assistance under food and nutrition services- the patient reports she is currently receiving $19.00 per month in assistance o Educated the patient on local food pantry's to assist with food insecurities o Mailed "[employer] [place]" to the patients home for review . Transportation o Determined the patient is currently relying on her daughter for transportation assistance but this is not a long term plan o Educated the patient on her [place] transportation benefit - Discussed concern this benefit would not offer enough trips per year given the amount of appointments the patient may have - Mailed information on this benefit to the patients home for review o Educated the patient on SCAT transportation resource for [place] transportation needs - Assisted with completion of Part A SCAT application - Collaboration with Katherine Brine, FNP regarding Part  B completion o Assessed for upcoming acute transportation needs - The patient has a neurology appointment on February 9 to see Dr. Krista Blue- the patient is unsure if her daughter will be able to assist  - Advised the patient to speak with her daughter and contact SW directly if assistance is needed with transportation by Wednesday 06/13/19 to account for 3 business day requirement to arrange under [employer] benefit- patient stated understanding . Scheduled follow up call to assist with on going care coordination needs . Communication with RN Case Manager regarding enrollment  Patient Self Care Activities:  . Patient verbalizes understanding of plan to work with CM team to address care coordination needs . Self administers medications as prescribed . Attends all scheduled provider appointments . Calls pharmacy for medication refills  Initial goal documentation         Materials provided: Verbal education about community resources provided by phone and by mail  Katherine Gregory was given information about Chronic Care Management services today including:  1. CCM service includes personalized support from designated clinical staff supervised by her physician, including individualized plan of care and coordination with other care providers 2. 24/7 contact phone numbers for assistance for urgent and routine care needs. 3. Service will only be billed when office clinical staff spend 20 minutes or more in a month to coordinate care. 4. Only one practitioner may furnish and bill the service in a calendar month. 5. The patient may stop CCM services at any time (effective at the end of the month) by phone call to the office staff. 6. The patient will be responsible for  cost sharing (co-pay) of up to 20% of the service fee (after annual deductible is met).  Patient agreed to services and verbal consent obtained.   The patient verbalized understanding of instructions provided today and declined a print copy of  patient instruction materials.   Follow up plan: SW will follow up with patient by phone over the next 10 days.   Katherine Gregory, BSW, CDP Social Worker, Certified Dementia Practitioner [facility] [place] [facility] [phone]

## 2019-06-14 ENCOUNTER — Ambulatory Visit: Payer: Self-pay

## 2019-06-14 ENCOUNTER — Telehealth: Payer: Self-pay | Admitting: Neurology

## 2019-06-14 ENCOUNTER — Telehealth: Payer: Self-pay

## 2019-06-14 DIAGNOSIS — F339 Major depressive disorder, recurrent, unspecified: Secondary | ICD-10-CM

## 2019-06-14 DIAGNOSIS — G10 Huntington's disease: Secondary | ICD-10-CM

## 2019-06-14 DIAGNOSIS — R6889 Other general symptoms and signs: Secondary | ICD-10-CM

## 2019-06-14 DIAGNOSIS — F331 Major depressive disorder, recurrent, moderate: Secondary | ICD-10-CM

## 2019-06-14 NOTE — Chronic Care Management (AMB) (Signed)
Chronic Care Management    Social Work Follow Up Note  06/14/2019 Name: Katherine Gregory MRN: [id number] DOB: 1965-11-07  Katherine Gregory is a 54 y.o. year old female who is a primary care patient of [name], [place]. The CCM team was consulted for assistance with care coordination.   Review of patient status, including review of consultants reports, other relevant assessments, and collaboration with appropriate care team members and the patient's provider was performed as part of comprehensive patient evaluation and provision of chronic care management services.     Outpatient Encounter Medications as of 06/14/2019  Medication Sig  . B Complex Vitamins (VITAMIN B COMPLEX PO) Take by mouth. Take 1 tablet daily  . Calcium Carb-Cholecalciferol (CALCIUM 1000 + D) 1000-800 MG-UNIT TABS Take by mouth. Take 1 tablet daily  . Cholecalciferol (VITAMIN D3) 50 MCG (2000 UT) TABS Take by mouth. Take 1 tablet daily  . diazepam (VALIUM) 2 MG tablet Take 2 mg by mouth every 6 (six) hours as needed for anxiety (ANXIETY).  . DULoxetine (CYMBALTA) 30 MG capsule Take 2 tabs by mouth daily  . Multiple Vitamin (MULTIVITAMIN ADULT PO) Take by mouth. Take 1 tablet daily  . omeprazole (PRILOSEC) 40 MG capsule Take 1 capsule (40 mg total) by mouth daily.  [name] tetrabenazine (XENAZINE) 12.5 MG tablet Take 12.5 mg by mouth 2 (two) times daily.   No facility-administered encounter medications on file as of 06/14/2019.     Goals Addressed            This Visit's Progress   . Assist with care coordination needs related to social determinants of health       Current Barriers:  . Huntington's Disease impacting ability to perform iADL's . Limited social support . Lacks knowledge of local community resources to assist with transportation . Limited income resulting in food insecurity  Social Work Clinical Goal(s):  [name] Over the next 45 days the patient will become more knowledgeable of local food  pantry's to assist with food insecurity . Over the next 45 days the patient will work with SW to become more knowledgeable of transportation resources . Over the next 60 days the patient will work with SW to complete and submit a SCAT application  CCM SW Interventions: Completed 06/14/19 . Outbound call placed to the patient to assess for transportation needs to [place] neurology appointment on 06/19/19 to see Dr. Krista Blue . Determined the patient is in need of assistance with arranging transportation under [facility] Benefit . Collaborative call placed to the patients health plan to arrange transportation o [place] Number: [phone] - Confirmed pick-up from the patients home address on 06/19/19 between 8:00 - 8:30 am to allow time for patient to arrive to 9:00 am appointment with Dr. Krista Blue at [facility]  - Requested round trip with [facility] service with pick-up time from MD appointment to be listed as "will call" due to the patient being unclear how long the appointment will last - Patient is to call [phone] for ride home . Assessed for patient understanding of how to use [employer] benefit o Patient exhibits memory recall deficit with inability of following thought process on how to access [facility] transportation benefit independently . Advised the patient SW would contact her Monday 06/18/19 to remind of pick-up time for Neurology appointment on 06/19/19 . Unsuccessful outbound call placed to [facility] to advise of contact number for patient to return home o Voice message left requesting return call  06/14/19 4:30 pm: . Voice  message received from Oliver with [facility] requesting return call o Unsuccessful return call to Marcelino Duster, [place] o SW spoke with clinic staff to relay [place] transportation contact number of [phone] along with reservation number [phone] o Advised clinic staff to contact SW directly if assistance is needed with transportation arrangements for patient 2/9  appointment  Patient Self Care Activities:  . Patient verbalizes understanding of plan to work with CM team to address care coordination needs . Self administers medications as prescribed . Attends all scheduled provider appointments . Calls pharmacy for medication refills  Please see past updates related to this goal by clicking on the "Past Updates" button in the selected goal          Follow Up Plan: SW will follow up with patient by phone over the next week as previously scheduled.   Bevelyn Ngo, BSW, CDP Social Worker, Certified Dementia Practitioner [facility] Care Management [phone]  Total time spent performing care coordination and/or care management activities with the patient by phone or face to face = 14 minutes.

## 2019-06-14 NOTE — Telephone Encounter (Signed)
I spoke to [place] and she went over the information again below. She wanted [place] to know she had scheduled a transportation appt for her visit with [place] on 2/9. We will need to call them for the patient.

## 2019-06-14 NOTE — Telephone Encounter (Signed)
Returned the call to Enrique Sack at [facility], which is the referring provider's office. Left a message asking for a return call. Provided our office hours and phone number.

## 2019-06-14 NOTE — Chronic Care Management (AMB) (Signed)
Chronic Care Management    Social Work Follow Up Note  06/14/2019 Name: Katherine Gregory MRN: [id number] DOB: [birth year]  Katherine Gregory is a 54 y.o. year old female who is a primary care patient of Arnette Felts, FNP. The CCM team was consulted for assistance with care coordination.   Review of patient status, including review of consultants reports, other relevant assessments, and collaboration with appropriate care team members and the patient's provider was performed as part of comprehensive patient evaluation and provision of chronic care management services.    SW placed a successful outbound call to the patient to assist with care coordination needs.  Outpatient Encounter Medications as of 06/14/2019  Medication Sig  . B Complex Vitamins (VITAMIN B COMPLEX PO) Take by mouth. Take 1 tablet daily  . Calcium Carb-Cholecalciferol (CALCIUM 1000 + D) 1000-800 MG-UNIT TABS Take by mouth. Take 1 tablet daily  . Cholecalciferol (VITAMIN D3) 50 MCG (2000 UT) TABS Take by mouth. Take 1 tablet daily  . diazepam (VALIUM) 2 MG tablet Take 2 mg by mouth every 6 (six) hours as needed for anxiety (ANXIETY).  . DULoxetine (CYMBALTA) 30 MG capsule Take 2 tabs by mouth daily  . Multiple Vitamin (MULTIVITAMIN ADULT PO) Take by mouth. Take 1 tablet daily  . omeprazole (PRILOSEC) 40 MG capsule Take 1 capsule (40 mg total) by mouth daily.  [name] tetrabenazine (XENAZINE) 12.5 MG tablet Take 12.5 mg by mouth 2 (two) times daily.   No facility-administered encounter medications on file as of 06/14/2019.     Goals Addressed            This Visit's Progress   . Assist with care coordination needs related to social determinants of health   On track    Current Barriers:  . Huntington's Disease impacting ability to perform iADL's . Limited social support . Lacks knowledge of local community resources to assist with transportation . Limited income resulting in food insecurity  Social Work  Clinical Goal(s):  [name] Over the next 45 days the patient will become more knowledgeable of local food pantry's to assist with food insecurity . Over the next 45 days the patient will work with SW to become more knowledgeable of transportation resources . Over the next 60 days the patient will work with SW to complete and submit a SCAT application  CCM SW Interventions: Completed 06/14/19 . Outbound call placed to the patient to assess for transportation needs to upcomming neurology appointment on 06/19/19 to see Dr. [name] . Determined the patient is in need of assistance with arranging transportation under [facility] Benefit . Collaborative call placed to the patients health plan to arrange transportation o [place] Number: [phone] - Confirmed pick-up from the patients home address on 06/19/19 between 8:00 - 8:30 am to allow time for patient to arrive to 9:00 am appointment with Dr. [name] at [facility]  - Requested round trip with [facility] service with pick-up time from MD appointment to be listed as "will call" due to the patient being unclear how long the appointment will last - Patient is to call [phone] for ride home . Assessed for patient understanding of how to use [employer] benefit o Patient exhibits memory recall deficit with inability of following thought process on how to access [facility] transportation benefit independently . Advised the patient SW would contact her Monday 06/18/19 to remind of pick-up time for Neurology appointment on 06/19/19 . Unsuccessful outbound call placed to [facility] to advise of contact number for patient  to return home o Voice message left requesting return call  Patient Self Care Activities:  . Patient verbalizes understanding of plan to work with CM team to address care coordination needs . Self administers medications as prescribed . Attends all scheduled provider appointments . Calls pharmacy for medication refills  Please see past updates related to  this goal by clicking on the "Past Updates" button in the selected goal          Follow Up Plan: SW will follow up with patient by phone over the next week.   Daneen Schick, BSW, CDP Social Worker, Certified Dementia Practitioner [place] [facility] [phone]  Total time spent performing care coordination and/or care management activities with the patient by phone or face to face = 27 minutes.

## 2019-06-14 NOTE — Telephone Encounter (Signed)
Enrique Sack a Child psychotherapist from [facility] called wanting to talk to RN about the pt using the [employer]. Enrique Sack would like to give information that she fears pt will forget on her appt date. Please advise. [phone]

## 2019-06-14 NOTE — Patient Instructions (Signed)
Social Worker Visit Information  Goals we discussed today:  Goals Addressed            This Visit's Progress   . Assist with care coordination needs related to social determinants of health   On track    Current Barriers:  . Huntington's Disease impacting ability to perform iADL's . Limited social support . Lacks knowledge of local community resources to assist with transportation . Limited income resulting in food insecurity  Social Work Clinical Goal(s):  [name] Over the next 45 days the patient will become more knowledgeable of local food pantry's to assist with food insecurity . Over the next 45 days the patient will work with SW to become more knowledgeable of transportation resources . Over the next 60 days the patient will work with SW to complete and submit a SCAT application  CCM SW Interventions: Completed 06/14/19 . Outbound call placed to the patient to assess for transportation needs to upcomming neurology appointment on 06/19/19 to see Dr. [name] . Determined the patient is in need of assistance with arranging transportation under [facility] Benefit . Collaborative call placed to the patients health plan to arrange transportation o [place] Number: [phone] - Confirmed pick-up from the patients home address on 06/19/19 between 8:00 - 8:30 am to allow time for patient to arrive to 9:00 am appointment with Dr. [name] at [facility]  - Requested round trip with [facility] service with pick-up time from MD appointment to be listed as "will call" due to the patient being unclear how long the appointment will last - Patient is to call [phone] for ride home . Assessed for patient understanding of how to use [employer] benefit o Patient exhibits memory recall deficit with inability of following thought process on how to access [facility] transportation benefit independently . Advised the patient SW would contact her Monday 06/18/19 to remind of pick-up time for Neurology appointment on  06/19/19 . Unsuccessful outbound call placed to [facility] to advise of contact number for patient to return home o Voice message left requesting return call  Patient Self Care Activities:  . Patient verbalizes understanding of plan to work with CM team to address care coordination needs . Self administers medications as prescribed . Attends all scheduled provider appointments . Calls pharmacy for medication refills  Please see past updates related to this goal by clicking on the "Past Updates" button in the selected goal          Materials Provided: Verbal education about transportation benefit provided by phone  Follow Up Plan: SW will follow up with patient by phone over the next week.   Bevelyn Ngo, BSW, CDP Social Worker, Certified Dementia Practitioner [facility] Care Management [phone]

## 2019-06-14 NOTE — Chronic Care Management (AMB) (Signed)
  Chronic Care Management   Outreach Note  06/14/2019 Name: Katherine Gregory MRN: [id number] DOB: [birth year]  Referred by: Arnette Felts, FNP Reason for referral : Care Coordination   SW placed an unsuccessful outbound call to the patient to confirm transportation assistance is not needed for 2/9 appointment to see Dr. [name]. SW left a voice message requesting a return call if transportation is needed by end of business day to account for three business day notice required by health plan.  Follow Up Plan: SW will follow up with the patient over the next month as previously scheduled.  Bevelyn Ngo, BSW, CDP Social Worker, Certified Dementia Practitioner [facility] Care Management [phone]

## 2019-06-14 NOTE — Telephone Encounter (Signed)
Enrique Sack at [facility] has called back to inform that re: pt's upcoming appointment(2-9)  She will be arriving on [employer].  Enrique Sack stated she wanted to give the # to Marcelino Duster, RN that needs to be called when pt is done with her appointment. Enrique Sack states pt may not remember as to why she wants Marcelino Duster to call for pt [phone] reservation#[phone].  Enrique Sack said if need be call her Tues and she will make the call for pt once her appointment is about done for pt to be taken home.

## 2019-06-18 ENCOUNTER — Ambulatory Visit: Payer: Self-pay

## 2019-06-18 ENCOUNTER — Telehealth: Payer: Self-pay

## 2019-06-18 DIAGNOSIS — G10 Huntington's disease: Secondary | ICD-10-CM

## 2019-06-18 DIAGNOSIS — F339 Major depressive disorder, recurrent, unspecified: Secondary | ICD-10-CM | POA: Diagnosis not present

## 2019-06-18 DIAGNOSIS — F331 Major depressive disorder, recurrent, moderate: Secondary | ICD-10-CM

## 2019-06-18 NOTE — Chronic Care Management (AMB) (Signed)
  Chronic Care Management   Outreach Note  06/18/2019 Name: Atiana Levier MRN: [id number] DOB: 1965-06-11  Referred by: Arnette Felts, FNP Reason for referral : Care Coordination   SW placed an unsuccessful outbound call to review transportation arrangements for 2/9 appointment to see Dr. [name]. Voice message left reminding patient of pick up time.   Follow Up Plan: SW will follow up with patient as previously planned to assist with care coordination needs.  Bevelyn Ngo, BSW, CDP Social Worker, Certified Dementia Practitioner [facility] Care Management [phone]

## 2019-06-18 NOTE — Telephone Encounter (Signed)
Pt called wanting your opinion on her Liver function being high you told her to drink lemon juice. She said her doctor in [place] told her that her liver was slightly enlarged she was wondering if that can cause the levels to be high. She also wants to know what else can be done or what would you suggest?

## 2019-06-18 NOTE — Chronic Care Management (AMB) (Signed)
Chronic Care Management    Social Work Follow Up Note  06/18/2019 Name: Katherine Gregory MRN: [id number] DOB: May 22, 1965  Katherine Gregory is a 54 y.o. year old female who is a primary care patient of Arnette Felts, FNP. The CCM team was consulted for assistance with care coordination.   Review of patient status, including review of consultants reports, other relevant assessments, and collaboration with appropriate care team members and the patient's provider was performed as part of comprehensive patient evaluation and provision of chronic care management services.    SW assisted with care coordination needs for upcomming neurology appointment.  Outpatient Encounter Medications as of 06/18/2019  Medication Sig  . B Complex Vitamins (VITAMIN B COMPLEX PO) Take by mouth. Take 1 tablet daily  . Calcium Carb-Cholecalciferol (CALCIUM 1000 + D) 1000-800 MG-UNIT TABS Take by mouth. Take 1 tablet daily  . Cholecalciferol (VITAMIN D3) 50 MCG (2000 UT) TABS Take by mouth. Take 1 tablet daily  . diazepam (VALIUM) 2 MG tablet Take 2 mg by mouth every 6 (six) hours as needed for anxiety (ANXIETY).  . DULoxetine (CYMBALTA) 30 MG capsule Take 2 tabs by mouth daily  . Multiple Vitamin (MULTIVITAMIN ADULT PO) Take by mouth. Take 1 tablet daily  . omeprazole (PRILOSEC) 40 MG capsule Take 1 capsule (40 mg total) by mouth daily.  [name] tetrabenazine (XENAZINE) 12.5 MG tablet Take 12.5 mg by mouth 2 (two) times daily.   No facility-administered encounter medications on file as of 06/18/2019.     Goals Addressed            This Visit's Progress   . Assist with care coordination needs related to social determinants of health       Current Barriers:  . Huntington's Disease impacting ability to perform iADL's . Limited social support . Lacks knowledge of local community resources to assist with transportation . Limited income resulting in food insecurity  Social Work Clinical Goal(s):  [name] Over  the next 45 days the patient will become more knowledgeable of local food pantry's to assist with food insecurity . Over the next 45 days the patient will work with SW to become more knowledgeable of transportation resources . Over the next 60 days the patient will work with SW to complete and submit a SCAT application  CCM SW Interventions: Completed 06/18/19 . Voice message received from the patient requesting assistance with arranging earlier pick up time as she was informed she needed to be at her 2/9 appointment by 8:30 am . Outbound call placed to [facility] transportation o Completed initial intake with automated representative indicating pick up details needed to be adjusted o Transferred to customer service; on-hold for 35 minutes o Requested call back to the patients contact number . Outbound call placed to the patient to advise SW requested a representative return a call to her contact number regarding an earlier pick up time o Discussed patients plans to cancel 2/9 appointment due to inability to arrange earlier pick up o Encouraged the patient to contact Dr. Zannie Cove office and discuss barriers with transportation and plan to arrive in time for 9:00 am appointment time o Advised the patient to contact SW if the appointment is rescheduled and patient needs assistance with arranging transportation o [place] Number: [phone] - Confirmed pick-up from the patients home address on 06/19/19 between 8:00 - 8:30 am to allow time for patient to arrive to 9:00 am appointment with Dr. [name] at [facility]  - Requested round trip with  [employer] service with pick-up time from MD appointment to be listed as "will call" due to the patient being unclear how long the appointment will last - Patient is to call [phone] for ride home  Patient Self Care Activities:  . Patient verbalizes understanding of plan to work with CM team to address care coordination needs . Self administers medications as  prescribed . Attends all scheduled provider appointments . Calls pharmacy for medication refills  Please see past updates related to this goal by clicking on the "Past Updates" button in the selected goal          Follow Up Plan: SW will follow up with patient by phone over the next month as previously scheduled.   Daneen Schick, BSW, CDP Social Worker, Certified Dementia Practitioner [place] [facility] [place] Management [phone]  Total time spent performing care coordination and/or care management activities with the patient by phone or face to face = 45 minutes.

## 2019-06-19 ENCOUNTER — Other Ambulatory Visit: Payer: Self-pay

## 2019-06-19 ENCOUNTER — Encounter: Payer: Self-pay | Admitting: Neurology

## 2019-06-19 ENCOUNTER — Ambulatory Visit: Payer: Medicare HMO | Admitting: Neurology

## 2019-06-19 VITALS — BP 130/80 | HR 73 | Temp 97.1°F | Ht 68.0 in | Wt 201.5 lb

## 2019-06-19 DIAGNOSIS — G10 Huntington's disease: Secondary | ICD-10-CM | POA: Diagnosis not present

## 2019-06-19 DIAGNOSIS — R269 Unspecified abnormalities of gait and mobility: Secondary | ICD-10-CM | POA: Insufficient documentation

## 2019-06-19 MED ORDER — TETRABENAZINE 12.5 MG PO TABS: 12.5000 mg | ORAL_TABLET | Freq: Two times a day (BID) | ORAL | 11 refills | Status: DC

## 2019-06-19 NOTE — Progress Notes (Signed)
PATIENT: Katherine Gregory DOB: 1966/02/25  Chief Complaint  Patient presents with  . Huntington's Disease    Last seen 06/17/2016. She had moved to [place] then to [place]. She is now back in this area. She is taking Xanzine 12.90m BID. However, she has noticed worsening movement during the evenings when she is lying down trying to go to sleep.      HISTORICAL  Katherine Gregory seen in request for Huntington's disease,  I saw her initially in February 2016, referred by her primary care physician Dr. AZollie Peefor Hungtingons disease   [facility]ena Leyden[facility] November 2014, based on her symptoms, positive family history, and genetic testing,  Her mother suffered Huntington's disease, diagnosis was made at age 53 passed away at age [id number] [id number] her younger sister at age 3136was also diagnosed with Huntington's disease, her mother has 7 children, 4 girls, 3 boys.  Katherine Gregory lives with her sister, she has a daughter at age 53 son age [id number] grand daughter age 57   She become symptomatic around 2013, she noticed extreme fatigue, eventually has to quit her job because of fatigue, initially she was diagnosed with fibromyalgia, later she developed chorea movement, gait difficulty, recent few weeks, she also developed uncontrollable neck jerking movement, which has been very bothersome,  She is followed by Dr. WGilford Rileat yearly basis, hope to follow-up with a local neurologist to address her needs.  The most bothersome symptoms for her is chorea movement , especially her abnormal neck jerking movement, gait difficulty, she also has depression, Zoloft has been very helpful.  We have tried to Haldol 1 mg up to 3 times daily briefly in 2016, with limited help, then she decided to continue [facility]  In 2018, she complains of worsening depression, was treated with Zoloft 50 mg one and  half tablets daily, trazodone 50 mg at that time, Cymbalta 60 mg daily, Valium 2 mg as needed, Risperdal 2 mg twice a day,   I reviewed [facility] record in 2015, positive ANA, with titer of 1-160, diffuse pattern, negative RPR, ESR 41, normal CPK 28, TSH 2.6, vitamin B12 241, C-reactive protein 16.8, lactic acid 0.8, Ace level was normal 21, there was ATimor-Lestegenetic testing in 2015 mentioned, but I do not have detailed report.  I also reviewed and summarzied records from Dr. WGilford Riledated January 30 2014, Huntington's genetic testing repeat 42,  MRI of the brain in 2014 was reported normal  [place]  [place] neurologist Dr. JLenny Pastel was started on tetrabenazine 12.5 mg twice daily, which has helped her chorea movement,  She now lives with her daughter, 378years old, no signs of Huntington's disease, patient is no longer driving, last tried was in 2018, she has unsteady gait, but ambulate without assistant  REVIEW OF SYSTEMS: Full 14 system review of systems performed and notable only for as above All other review of systems were negative.  ALLERGIES: Allergies  Allergen Reactions  . Ibuprofen Other (See Comments)    "It intensifies my pain." Made pain and symptoms worse. "It intensifies my pain."  . Morphine Itching    Red blotches on skin and burning.  . Morphine And Related Itching and Other (See Comments)    Reaction burns Reaction burns  .  Penicillin G Itching  . Penicillins Hives and Itching    Has patient had a PCN reaction causing immediate rash, facial/tongue/throat swelling, SOB or lightheadedness with hypotension: Yes Has patient had a PCN reaction causing severe rash involving mucus membranes or skin necrosis: No Has patient had a PCN reaction that required hospitalization No Has patient had a PCN reaction occurring within the last 10 years: No If all of the above  answers are "NO", then may proceed with Cephalosporin use.   . Adhesive [Tape] Itching    Itching and burning at site Itching and burning at site  . Pseudoephedrine Other (See Comments) and Palpitations    Speed up heart rate Speed up heart rate    HOME MEDICATIONS: Current Outpatient Medications  Medication Sig Dispense Refill  . B Complex Vitamins (VITAMIN B COMPLEX PO) Take by mouth. Take 1 tablet daily    . Calcium Carb-Cholecalciferol (CALCIUM 1000 + D) 1000-800 MG-UNIT TABS Take by mouth. Take 1 tablet daily    . Cholecalciferol (VITAMIN D3) 50 MCG (2000 UT) TABS Take by mouth. Take 1 tablet daily    . DULoxetine (CYMBALTA) 30 MG capsule Take 2 tabs by mouth daily 60 capsule 3  . Multiple Vitamin (MULTIVITAMIN ADULT PO) Take by mouth. Take 1 tablet daily    . Multiple Vitamins-Minerals (AIRBORNE PO) Take 4 tablets by mouth daily.    . Multiple Vitamins-Minerals (ZINC PO) Take 60 mg by mouth daily.    [name] omeprazole (PRILOSEC) 40 MG capsule Take 1 capsule (40 mg total) by mouth daily. 90 capsule 1  . Probiotic Product (PROBIOTIC PO) Take 1 tablet by mouth daily.    [name] tetrabenazine (XENAZINE) 12.5 MG tablet Take 12.5 mg by mouth 2 (two) times daily.    . Wheat Dextrin (BENEFIBER PO) Take 1 tablet by mouth daily.     No current facility-administered medications for this visit.    PAST MEDICAL HISTORY: Past Medical History:  Diagnosis Date  . Acid reflux   . Bleeding gastric ulcer   . Cellulitis   . Cervical back pain with evidence of disc disease   . Depression   . Dysuria   . FATIGUE   . Fibromyalgia   . FLANK PAIN, RIGHT   . GOITER   . HEADACHES, HX OF   . Huntington's disease ([place])   . IBS (irritable bowel syndrome)   . Palpitations   . Post partum depression   . Seasonal allergies   . URTICARIA   . Vertigo   . Vitamin D deficiency     PAST SURGICAL HISTORY: Past Surgical History:  Procedure Laterality Date  . CARDIAC CATHETERIZATION     05/17/12 / waiting  on results  . CARTILAGE SURGERY     RT wrist  . CESAREAN SECTION  1988 & 2008   x 2  . CHOLECYSTECTOMY    . DILITATION & CURRETTAGE/HYSTROSCOPY WITH HYDROTHERMAL ABLATION N/A 01/24/2015   Procedure: DILATATION & CURETTAGE/HYSTEROSCOPY WITH HYDROTHERMAL ABLATION;  Surgeon: Shelly Bombard, MD;  Location: [facility];  Service: Gynecology;  Laterality: N/A;  Elberta Fortis will be here  . WISDOM TOOTH EXTRACTION      FAMILY HISTORY: Family History  Problem Relation Age of Onset  . Hypertension Mother   . Huntington's disease Mother        deceased 08-14-22  . Diabetes Father   . Hypertension Father   . Heart disease Father   . Scoliosis Sister   . Huntington's disease Sister   . Diabetes  Brother   . Hypertension Brother   . Scoliosis Brother   . Skin cancer Maternal Grandmother     SOCIAL HISTORY: Social History   Socioeconomic History  . Marital status: Single    Spouse name: Not on file  . Number of children: 2  . Years of education: some college  . Highest education level: Not on file  Occupational History  . Occupation: Disabled  Tobacco Use  . Smoking status: Never Smoker  . Smokeless tobacco: Never Used  Substance and Sexual Activity  . Alcohol use: Yes    Alcohol/week: 1.0 standard drinks    Types: 1 Glasses of wine per week    Comment: occassional  . Drug use: No  . Sexual activity: Not Currently    Birth control/protection: Surgical    Comment: Tubal Ligation   Other Topics Concern  . Not on file  Social History Narrative   Patient lives at home with a family member. Patient is single.   Disabled.   Education some college.   Left handed.   Caffeine some times not daily.   Social Determinants of Health   Financial Resource Strain:   . Difficulty of Paying Living Expenses: Not on file  Food Insecurity: Food Insecurity Present  . Worried About Charity fundraiser in the Last Year: Sometimes true  . Ran Out of Food in the Last Year: Sometimes true  Transportation  Needs: Unmet Transportation Needs  . Lack of Transportation (Medical): Yes  . Lack of Transportation (Non-Medical): No  Physical Activity:   . Days of Exercise per Week: Not on file  . Minutes of Exercise per Session: Not on file  Stress:   . Feeling of Stress : Not on file  Social Connections:   . Frequency of Communication with Friends and Family: Not on file  . Frequency of Social Gatherings with Friends and Family: Not on file  . Attends Religious Services: Not on file  . Active Member of Clubs or Organizations: Not on file  . Attends Archivist Meetings: Not on file  . Marital Status: Not on file  Intimate Partner Violence:   . Fear of Current or Ex-Partner: Not on file  . Emotionally Abused: Not on file  . Physically Abused: Not on file  . Sexually Abused: Not on file     PHYSICAL EXAM   Vitals:   06/19/19 0845  BP: 130/80  Pulse: 73  Temp: (!) 97.1 F (36.2 C)  Weight: 201 lb 8 oz (91.4 kg)  Height: 5' 8"  (1.727 m)    Not recorded      Body mass index is 30.64 kg/m.  PHYSICAL EXAMNIATION:  Gen: NAD, conversant, well nourised, well groomed                     Cardiovascular: Regular rate rhythm, no peripheral edema, warm, nontender. Eyes: Conjunctivae clear without exudates or hemorrhage Neck: Supple, no carotid bruits. Pulmonary: Clear to auscultation bilaterally   NEUROLOGICAL EXAM:  MENTAL STATUS: Speech:    Speech is normal; fluent and spontaneous with normal comprehension.  Cognition:     Orientation to time, place and person     Normal recent and remote memory     Normal Attention span and concentration     Normal Language, naming, repeating,spontaneous speech     Fund of knowledge   CRANIAL NERVES: Mild mouth chorea movement, CN II: Visual fields are full to confrontation. Pupils are round equal and briskly  reactive to light. CN III, IV, VI: extraocular movement are normal. No ptosis. CN V: Facial sensation is intact to light  touch CN VII: Face is symmetric with normal eye closure  CN VIII: Hearing is normal to causal conversation. CN IX, X: Phonation is normal. CN XI: Head turning and shoulder shrug are intact  MOTOR: She has occasionally large amplitude chorea movement,  REFLEXES: Reflexes are 2+ and symmetric at the biceps, triceps, knees, and ankles. Plantar responses are flexor.  SENSORY: Intact to light touch, pinprick and vibratory sensation are intact in fingers and toes.  COORDINATION: There is no trunk or limb dysmetria noted.  GAIT/STANCE: Wide-based, cautious, stiff, mildly unsteady gait Romberg is absent.   DIAGNOSTIC DATA (LABS, IMAGING, TESTING) - I reviewed patient records, labs, notes, testing and imaging myself where available.   ASSESSMENT AND PLAN  Briony Stephanye Finnicum is a 54 y.o. female   Huntington's disease,  Strong family history, her mother, and younger sister suffered Huntington's disease,  Genetic proven, CAG repeating 73  Continue tetrabenazine 12.5 mg twice a day  Return to clinic in 6 6 months with nurse practitioner Sarah Depression  She has pending appointment with her psychiatrist  Total time spent reviewing the chart, obtaining history, examined patient, ordering tests, documentation, consultations and family, care coordination was 49 minutes    Marcial Pacas, M.D. Ph.D.  [facility] [address] [place], [place] [address] Ph: [phone] Fax: [phone]  CC: Referring Provider

## 2019-06-20 ENCOUNTER — Ambulatory Visit: Payer: Self-pay

## 2019-06-20 DIAGNOSIS — F331 Major depressive disorder, recurrent, moderate: Secondary | ICD-10-CM

## 2019-06-20 DIAGNOSIS — G10 Huntington's disease: Secondary | ICD-10-CM

## 2019-06-20 NOTE — Chronic Care Management (AMB) (Signed)
Chronic Care Management    Social Work Follow Up Note  06/20/2019 Name: Katherine Gregory MRN: [id number] DOB: [birth year]  Katherine Gregory is a 55 y.o. year old female who is a primary care patient of [name], [place]. The CCM team was consulted for assistance with care coordination.   Review of patient status, including review of consultants reports, other relevant assessments, and collaboration with appropriate care team members and the patient's provider was performed as part of comprehensive patient evaluation and provision of chronic care management services.    SW assisted with care coordination by submitting SCAT application on the patients behalf.  Outpatient Encounter Medications as of 06/20/2019  Medication Sig  . B Complex Vitamins (VITAMIN B COMPLEX PO) Take by mouth. Take 1 tablet daily  . Calcium Carb-Cholecalciferol (CALCIUM 1000 + D) 1000-800 MG-UNIT TABS Take by mouth. Take 1 tablet daily  . Cholecalciferol (VITAMIN D3) 50 MCG (2000 UT) TABS Take by mouth. Take 1 tablet daily  . DULoxetine (CYMBALTA) 30 MG capsule Take 2 tabs by mouth daily  . Multiple Vitamin (MULTIVITAMIN ADULT PO) Take by mouth. Take 1 tablet daily  . Multiple Vitamins-Minerals (AIRBORNE PO) Take 4 tablets by mouth daily.  . Multiple Vitamins-Minerals (ZINC PO) Take 60 mg by mouth daily.  [name] omeprazole (PRILOSEC) 40 MG capsule Take 1 capsule (40 mg total) by mouth daily.  . Probiotic Product (PROBIOTIC PO) Take 1 tablet by mouth daily.  [name] tetrabenazine (XENAZINE) 12.5 MG tablet Take 1 tablet (12.5 mg total) by mouth 2 (two) times daily.  . Wheat Dextrin (BENEFIBER PO) Take 1 tablet by mouth daily.   No facility-administered encounter medications on file as of 06/20/2019.     Goals Addressed            This Visit's Progress   . Assist with care coordination needs related to social determinants of health   On track    Current Barriers:  . Huntington's Disease impacting ability to  perform iADL's . Limited social support . Lacks knowledge of local community resources to assist with transportation . Limited income resulting in food insecurity  Social Work Clinical Goal(s):  [name] Over the next 45 days the patient will become more knowledgeable of local food pantry's to assist with food insecurity . Over the next 45 days the patient will work with SW to become more knowledgeable of transportation resources . Over the next 60 days the patient will work with SW to complete and submit a SCAT application Goal Met 6/96/7893  CCM SW Interventions: Completed 06/20/19 . Collaboration with primary provider whom has completed Part B of SCAT application . Submitted Part A and Part B to Almedia Balls with SCAT Eligibility office on the patients behalf  Patient Self Care Activities:  . Patient verbalizes understanding of plan to work with CM team to address care coordination needs . Self administers medications as prescribed . Attends all scheduled provider appointments . Calls pharmacy for medication refills  Please see past updates related to this goal by clicking on the "Past Updates" button in the selected goal          Follow Up Plan: SW will follow up with patient by phone over the next month as previously scheduled.   Daneen Schick, BSW, CDP Social Worker, Certified Dementia Practitioner [place] [facility] [phone]  Total time spent performing care coordination and/or care management activities with the patient by phone or face to face = 5 minutes.

## 2019-06-21 ENCOUNTER — Encounter: Payer: Self-pay | Admitting: Nurse Practitioner

## 2019-06-21 ENCOUNTER — Ambulatory Visit (INDEPENDENT_AMBULATORY_CARE_PROVIDER_SITE_OTHER): Payer: Medicare HMO | Admitting: Nurse Practitioner

## 2019-06-21 ENCOUNTER — Other Ambulatory Visit: Payer: Self-pay

## 2019-06-21 VITALS — BP 118/80 | HR 77 | Temp 98.3°F | Ht 68.0 in | Wt 200.2 lb

## 2019-06-21 DIAGNOSIS — R5383 Other fatigue: Secondary | ICD-10-CM

## 2019-06-21 DIAGNOSIS — E538 Deficiency of other specified B group vitamins: Secondary | ICD-10-CM | POA: Diagnosis not present

## 2019-06-21 MED ORDER — CYANOCOBALAMIN 1000 MCG/ML IJ SOLN
1000.0000 ug | Freq: Once | INTRAMUSCULAR | Status: AC
  Administered 2019-06-21: 1000 ug via INTRAMUSCULAR

## 2019-06-21 NOTE — Patient Instructions (Signed)
Vitamin B12 Deficiency Vitamin B12 deficiency occurs when the body does not have enough vitamin B12, which is an important vitamin. The body needs this vitamin:  To make red blood cells.  To make DNA. This is the genetic material inside cells.  To help the nerves work properly so they can carry messages from the brain to the body. Vitamin B12 deficiency can cause various health problems, such as a low red blood cell count (anemia) or nerve damage. What are the causes? This condition may be caused by:  Not eating enough foods that contain vitamin B12.  Not having enough stomach acid and digestive fluids to properly absorb vitamin B12 from the food that you eat.  Certain digestive system diseases that make it hard to absorb vitamin B12. These diseases include Crohn's disease, chronic pancreatitis, and cystic fibrosis.  A condition in which the body does not make enough of a protein (intrinsic factor), resulting in too few red blood cells (pernicious anemia).  Having a surgery in which part of the stomach or small intestine is removed.  Taking certain medicines that make it hard for the body to absorb vitamin B12. These medicines include: ? Heartburn medicines (antacids and proton pump inhibitors). ? Certain antibiotic medicines. ? Some medicines that are used to treat diabetes, tuberculosis, gout, or high cholesterol. What increases the risk? The following factors may make you more likely to develop a B12 deficiency:  Being older than age 50.  Eating a vegetarian or vegan diet, especially while you are pregnant.  Eating a poor diet while you are pregnant.  Taking certain medicines.  Having alcoholism. What are the signs or symptoms? In some cases, there are no symptoms of this condition. If the condition leads to anemia or nerve damage, various symptoms can occur, such as:  Weakness.  Fatigue.  Loss of appetite.  Weight loss.  Numbness or tingling in your hands and  feet.  Redness and burning of the tongue.  Confusion or memory problems.  Depression.  Sensory problems, such as color blindness, ringing in the ears, or loss of taste.  Diarrhea or constipation.  Trouble walking. If anemia is severe, symptoms can include:  Shortness of breath.  Dizziness.  Rapid heart rate (tachycardia). How is this diagnosed? This condition may be diagnosed with a blood test to measure the level of vitamin B12 in your blood. You may also have other tests, including:  A group of tests that measure certain characteristics of blood cells (complete blood count, CBC).  A blood test to measure intrinsic factor.  A procedure where a thin tube with a camera on the end is used to look into your stomach or intestines (endoscopy). Other tests may be needed to discover the cause of B12 deficiency. How is this treated? Treatment for this condition depends on the cause. This condition may be treated by:  Changing your eating and drinking habits, such as: ? Eating more foods that contain vitamin B12. ? Drinking less alcohol or no alcohol.  Getting vitamin B12 injections.  Taking vitamin B12 supplements. Your health care provider will tell you which dosage is best for you. Follow these instructions at home: Eating and drinking   Eat lots of healthy foods that contain vitamin B12, including: ? Meats and poultry. This includes beef, pork, chicken, turkey, and organ meats, such as liver. ? Seafood. This includes clams, rainbow trout, salmon, tuna, and haddock. ? Eggs. ? Cereal and dairy products that are fortified. This means that vitamin B12   has been added to the food. Check the label on the package to see if the food is fortified. The items listed above may not be a complete list of recommended foods and beverages. Contact a dietitian for more information. General instructions  Get any injections that are prescribed by your health care provider.  Take  supplements only as told by your health care provider. Follow the directions carefully.  Do not drink alcohol if your health care provider tells you not to. In some cases, you may only be asked to limit alcohol use.  Keep all follow-up visits as told by your health care provider. This is important. Contact a health care provider if:  Your symptoms come back. Get help right away if you:  Develop shortness of breath.  Have a rapid heart rate.  Have chest pain.  Become dizzy or lose consciousness. Summary  Vitamin B12 deficiency occurs when the body does not have enough vitamin B12.  The main causes of vitamin B12 deficiency include dietary deficiency, digestive diseases, pernicious anemia, and having a surgery in which part of the stomach or small intestine is removed.  In some cases, there are no symptoms of this condition. If the condition leads to anemia or nerve damage, various symptoms can occur, such as weakness, shortness of breath, and numbness.  Treatment may include getting vitamin B12 injections or taking vitamin B12 supplements. Eat lots of healthy foods that contain vitamin B12. This information is not intended to replace advice given to you by your health care provider. Make sure you discuss any questions you have with your health care provider. Document Revised: 10/13/2018 Document Reviewed: 01/03/2018 Elsevier Patient Education  2020 [employer]  

## 2019-06-21 NOTE — Progress Notes (Signed)
This visit occurred during the SARS-CoV-2 public health emergency.  Safety protocols were in place, including screening questions prior to the visit, additional usage of staff PPE, and extensive cleaning of exam room while observing appropriate contact time as indicated for disinfecting solutions.  Subjective:     Patient ID: Katherine Gregory , female    DOB: Jun 13, 1965 , 54 y.o.   MRN: [id number]   Chief Complaint  Patient presents with  . B12 Injection    HPI  Here for 1st vitamin B12 of 4 weekly.  She wants to recommend her for therapy, once she gets her medicaid. She has not receive the Covid vaccine.  She is really tired right now.      Past Medical History:  Diagnosis Date  . Acid reflux   . Bleeding gastric ulcer   . Cellulitis   . Cervical back pain with evidence of disc disease   . Depression   . Dysuria   . FATIGUE   . Fibromyalgia   . FLANK PAIN, RIGHT   . GOITER   . HEADACHES, HX OF   . Huntington's disease ([place])   . IBS (irritable bowel syndrome)   . Palpitations   . Post partum depression   . Seasonal allergies   . URTICARIA   . Vertigo   . Vitamin D deficiency      Family History  Problem Relation Age of Onset  . Hypertension Mother   . Huntington's disease Mother        deceased 08-21-22  . Diabetes Father   . Hypertension Father   . Heart disease Father   . Scoliosis Sister   . Huntington's disease Sister   . Diabetes Brother   . Hypertension Brother   . Scoliosis Brother   . Skin cancer Maternal Grandmother      Current Outpatient Medications:  .  B Complex Vitamins (VITAMIN B COMPLEX PO), Take by mouth. Take 1 tablet daily, Disp: , Rfl:  .  Calcium Carb-Cholecalciferol (CALCIUM 1000 + D) 1000-800 MG-UNIT TABS, Take by mouth. Take 1 tablet daily, Disp: , Rfl:  .  DULoxetine (CYMBALTA) 30 MG capsule, Take 2 tabs by mouth daily, Disp: 60 capsule, Rfl: 3 .  Multiple Vitamin (MULTIVITAMIN ADULT PO), Take by mouth. Take 1 tablet daily,  Disp: , Rfl:  .  Multiple Vitamins-Minerals (AIRBORNE PO), Take 4 tablets by mouth daily., Disp: , Rfl:  .  Multiple Vitamins-Minerals (ZINC PO), Take 60 mg by mouth daily., Disp: , Rfl:  .  omeprazole (PRILOSEC) 40 MG capsule, Take 1 capsule (40 mg total) by mouth daily., Disp: 90 capsule, Rfl: 1 .  Probiotic Product (PROBIOTIC PO), Take 1 tablet by mouth daily., Disp: , Rfl:  .  tetrabenazine (XENAZINE) 12.5 MG tablet, Take 1 tablet (12.5 mg total) by mouth 2 (two) times daily., Disp: 60 tablet, Rfl: 11 .  Wheat Dextrin (BENEFIBER PO), Take 1 tablet by mouth daily., Disp: , Rfl:  .  Cholecalciferol (VITAMIN D3) 50 MCG (2000 UT) TABS, Take by mouth. Take 1 tablet daily, Disp: , Rfl:    Allergies  Allergen Reactions  . Ibuprofen Other (See Comments)    "It intensifies my pain." Made pain and symptoms worse. "It intensifies my pain."  . Morphine Itching    Red blotches on skin and burning.  . Morphine And Related Itching and Other (See Comments)    Reaction burns Reaction burns  . Penicillin G Itching  . Penicillins Hives and Itching  Has patient had a PCN reaction causing immediate rash, facial/tongue/throat swelling, SOB or lightheadedness with hypotension: Yes Has patient had a PCN reaction causing severe rash involving mucus membranes or skin necrosis: No Has patient had a PCN reaction that required hospitalization No Has patient had a PCN reaction occurring within the last 10 years: No If all of the above answers are "NO", then may proceed with Cephalosporin use.   . Adhesive [Tape] Itching    Itching and burning at site Itching and burning at site  . Pseudoephedrine Other (See Comments) and Palpitations    Speed up heart rate Speed up heart rate     Review of Systems  Constitutional: Negative.   Respiratory: Negative.   Cardiovascular: Negative for chest pain, palpitations and leg swelling.  Neurological: Negative for dizziness and headaches.  Psychiatric/Behavioral:  Negative.      Today's Vitals   06/21/19 1552  BP: 118/80  Pulse: 77  Temp: 98.3 F (36.8 C)  TempSrc: Oral  Weight: 200 lb 3.2 oz (90.8 kg)  Height: 5\' 8"  (1.727 m)  PainSc: 0-No pain   Body mass index is 30.44 kg/m.   Objective:  Physical Exam Vitals reviewed.  Constitutional:      Appearance: Normal appearance.  Cardiovascular:     Rate and Rhythm: Normal rate and regular rhythm.     Heart sounds: Normal heart sounds. No murmur.  Neurological:     General: No focal deficit present.     Mental Status: She is alert and oriented to person, place, and time.     Cranial Nerves: No cranial nerve deficit.  Psychiatric:        Mood and Affect: Mood normal.        Behavior: Behavior normal.        Thought Content: Thought content normal.        Judgment: Judgment normal.         Assessment And Plan:     1. Vitamin B12 deficiency  First of 4 weekly vitamin b12 injections  Discussed benefits of vitamin b12 injections to help with fatigue, memory and some weight loss - cyanocobalamin ((VITAMIN B-12)) injection 1,000 mcg        , FNP    THE PATIENT IS ENCOURAGED TO PRACTICE SOCIAL DISTANCING DUE TO THE COVID-19 PANDEMIC.

## 2019-06-21 NOTE — Telephone Encounter (Signed)
Sometimes that can be the cause we will recheck the levels at her next lab visit

## 2019-06-25 ENCOUNTER — Ambulatory Visit: Payer: Self-pay

## 2019-06-25 ENCOUNTER — Other Ambulatory Visit: Payer: Self-pay

## 2019-06-25 ENCOUNTER — Telehealth: Payer: Self-pay | Admitting: Neurology

## 2019-06-25 DIAGNOSIS — G10 Huntington's disease: Secondary | ICD-10-CM

## 2019-06-25 DIAGNOSIS — F331 Major depressive disorder, recurrent, moderate: Secondary | ICD-10-CM

## 2019-06-25 MED ORDER — OMEPRAZOLE 40 MG PO CPDR: 40.0000 mg | DELAYED_RELEASE_CAPSULE | Freq: Every day | ORAL | 1 refills | Status: DC

## 2019-06-25 MED ORDER — DULOXETINE HCL 30 MG PO CPEP: ORAL_CAPSULE | ORAL | 2 refills | Status: DC

## 2019-06-25 NOTE — Telephone Encounter (Signed)
Patient called stating she was advised by pcp to FU with neurologist if she should get the covid vaccine.  Please follow up.

## 2019-06-25 NOTE — Telephone Encounter (Signed)
I spoke to the patient. Denies any history of anaphylaxis reaction to any medication in the past. She is aware that Dr. [name] is recommending the COVID-19 vaccination Proofreader or [facility]) for her patients.

## 2019-06-26 NOTE — Patient Instructions (Signed)
Social Worker Visit Information  Goals we discussed today:  Goals Addressed            This Visit's Progress   . Assist with care coordination needs related to social determinants of health   On track    Current Barriers:  . Huntington's Disease impacting ability to perform iADL's . Limited social support . Lacks knowledge of local community resources to assist with transportation . Limited income resulting in food insecurity  Social Work Clinical Goal(s):  [name] Over the next 45 days the patient will become more knowledgeable of local food pantry's to assist with food insecurity . Over the next 45 days the patient will work with SW to become more knowledgeable of transportation resources . Over the next 60 days the patient will work with SW to complete and submit a SCAT application Goal Met 1/46/4314  CCM SW Interventions: Completed 06/25/19 . Outbound call placed to the patient to communicate SCAT application submission . Reviewed opportunity to access health plan transportation benefit as needed while awaiting SCAT approval . No acute transportation needs identified during today's call . Advised the patient SW would follow up over the next four weeks to determine SCAT approval status  Patient Self Care Activities:  . Patient verbalizes understanding of plan to work with CM team to address care coordination needs . Self administers medications as prescribed . Attends all scheduled provider appointments . Calls pharmacy for medication refills  Please see past updates related to this goal by clicking on the "Past Updates" button in the selected goal          Materials Provided: Verbal education about transportation resources provided by phone  Follow Up Plan: SW will follow up with patient by phone over the next four weeks.   Daneen Schick, BSW, CDP Social Worker, Certified Dementia Practitioner [place] [facility] [place] [facility] [phone]

## 2019-06-26 NOTE — Chronic Care Management (AMB) (Signed)
Chronic Care Management    Social Work Follow Up Note  06/25/2019 Name: Katherine Gregory MRN: [id number] DOB: 1966/04/26  Katherine Gregory is a 54 y.o. year old female who is a primary care patient of [name], [place]. The CCM team was consulted for assistance with care coordination.   Review of patient status, including review of consultants reports, other relevant assessments, and collaboration with appropriate care team members and the patient's provider was performed as part of comprehensive patient evaluation and provision of chronic care management services.    SW placed a successful outbound call to the patient to assist with care coordination needs.  Outpatient Encounter Medications as of 06/25/2019  Medication Sig  . B Complex Vitamins (VITAMIN B COMPLEX PO) Take by mouth. Take 1 tablet daily  . Calcium Carb-Cholecalciferol (CALCIUM 1000 + D) 1000-800 MG-UNIT TABS Take by mouth. Take 1 tablet daily  . Cholecalciferol (VITAMIN D3) 50 MCG (2000 UT) TABS Take by mouth. Take 1 tablet daily  . DULoxetine (CYMBALTA) 30 MG capsule Take 2 tablets by mouth daily  . Multiple Vitamin (MULTIVITAMIN ADULT PO) Take by mouth. Take 1 tablet daily  . Multiple Vitamins-Minerals (AIRBORNE PO) Take 4 tablets by mouth daily.  . Multiple Vitamins-Minerals (ZINC PO) Take 60 mg by mouth daily.  [name] omeprazole (PRILOSEC) 40 MG capsule Take 1 capsule (40 mg total) by mouth daily.  . Probiotic Product (PROBIOTIC PO) Take 1 tablet by mouth daily.  [name] tetrabenazine (XENAZINE) 12.5 MG tablet Take 1 tablet (12.5 mg total) by mouth 2 (two) times daily.  . Wheat Dextrin (BENEFIBER PO) Take 1 tablet by mouth daily.   No facility-administered encounter medications on file as of 06/25/2019.     Goals Addressed            This Visit's Progress   . Assist with care coordination needs related to social determinants of health   On track    Current Barriers:  . Huntington's Disease impacting ability  to perform iADL's . Limited social support . Lacks knowledge of local community resources to assist with transportation . Limited income resulting in food insecurity  Social Work Clinical Goal(s):  [name] Over the next 45 days the patient will become more knowledgeable of local food pantry's to assist with food insecurity . Over the next 45 days the patient will work with SW to become more knowledgeable of transportation resources . Over the next 60 days the patient will work with SW to complete and submit a SCAT application Goal Met 11/06/4763  CCM SW Interventions: Completed 06/25/19 . Outbound call placed to the patient to communicate SCAT application submission . Reviewed opportunity to access health plan transportation benefit as needed while awaiting SCAT approval . No acute transportation needs identified during today's call . Advised the patient SW would follow up over the next four weeks to determine SCAT approval status  Patient Self Care Activities:  . Patient verbalizes understanding of plan to work with CM team to address care coordination needs . Self administers medications as prescribed . Attends all scheduled provider appointments . Calls pharmacy for medication refills  Please see past updates related to this goal by clicking on the "Past Updates" button in the selected goal          Follow Up Plan: SW will follow up with patient by phone over the next four weeks.   Daneen Schick, BSW, CDP Social Worker, Certified Dementia Practitioner [place] [facility] [facility] [phone]  Total time  spent performing care coordination and/or care management activities with the patient by phone or face to face = 7 minutes.

## 2019-06-27 ENCOUNTER — Ambulatory Visit (INDEPENDENT_AMBULATORY_CARE_PROVIDER_SITE_OTHER): Payer: Medicare HMO | Admitting: Nurse Practitioner

## 2019-06-27 ENCOUNTER — Encounter: Payer: Self-pay | Admitting: Nurse Practitioner

## 2019-06-27 ENCOUNTER — Other Ambulatory Visit: Payer: Self-pay

## 2019-06-27 VITALS — BP 118/84 | HR 77 | Temp 98.3°F | Ht 68.0 in | Wt 201.6 lb

## 2019-06-27 DIAGNOSIS — E538 Deficiency of other specified B group vitamins: Secondary | ICD-10-CM

## 2019-06-27 DIAGNOSIS — R0602 Shortness of breath: Secondary | ICD-10-CM

## 2019-06-27 MED ORDER — CYANOCOBALAMIN 1000 MCG/ML IJ SOLN
1000.0000 ug | Freq: Once | INTRAMUSCULAR | Status: AC
  Administered 2019-06-27: 16:00:00 1000 ug via INTRAMUSCULAR

## 2019-06-27 NOTE — Progress Notes (Signed)
This visit occurred during the SARS-CoV-2 public health emergency.  Safety protocols were in place, including screening questions prior to the visit, additional usage of staff PPE, and extensive cleaning of exam room while observing appropriate contact time as indicated for disinfecting solutions.  Subjective:     Patient ID: Katherine Gregory , female    DOB: [birth year] , 54 y.o.   MRN: [id number]   Chief Complaint  Patient presents with  . B12 Injection    HPI  Here for 2nd vitamin B12 of 4 weekly.    She states  She spoke with Dr. Krista Blue and recommends her having Covid vaccine.      Past Medical History:  Diagnosis Date  . Acid reflux   . Bleeding gastric ulcer   . Cellulitis   . Cervical back pain with evidence of disc disease   . Depression   . Dysuria   . FATIGUE   . Fibromyalgia   . FLANK PAIN, RIGHT   . GOITER   . HEADACHES, HX OF   . Huntington's disease ([place])   . IBS (irritable bowel syndrome)   . Palpitations   . Post partum depression   . Seasonal allergies   . URTICARIA   . Vertigo   . Vitamin D deficiency      Family History  Problem Relation Age of Onset  . Hypertension Mother   . Huntington's disease Mother        deceased August 10, 2022  . Diabetes Father   . Hypertension Father   . Heart disease Father   . Scoliosis Sister   . Huntington's disease Sister   . Diabetes Brother   . Hypertension Brother   . Scoliosis Brother   . Skin cancer Maternal Grandmother      Current Outpatient Medications:  .  B Complex Vitamins (VITAMIN B COMPLEX PO), Take by mouth. Take 1 tablet daily, Disp: , Rfl:  .  Calcium Carb-Cholecalciferol (CALCIUM 1000 + D) 1000-800 MG-UNIT TABS, Take by mouth. Take 1 tablet daily, Disp: , Rfl:  .  Cholecalciferol (VITAMIN D3) 50 MCG (2000 UT) TABS, Take by mouth. Take 1 tablet daily, Disp: , Rfl:  .  DULoxetine (CYMBALTA) 30 MG capsule, Take 2 tablets by mouth daily, Disp: 60 capsule, Rfl: 2 .  Multiple Vitamin (MULTIVITAMIN  ADULT PO), Take by mouth. Take 1 tablet daily, Disp: , Rfl:  .  Multiple Vitamins-Minerals (AIRBORNE PO), Take 4 tablets by mouth daily., Disp: , Rfl:  .  Multiple Vitamins-Minerals (ZINC PO), Take 60 mg by mouth daily., Disp: , Rfl:  .  omeprazole (PRILOSEC) 40 MG capsule, Take 1 capsule (40 mg total) by mouth daily., Disp: 90 capsule, Rfl: 1 .  tetrabenazine (XENAZINE) 12.5 MG tablet, Take 1 tablet (12.5 mg total) by mouth 2 (two) times daily., Disp: 60 tablet, Rfl: 11 .  Wheat Dextrin (BENEFIBER PO), Take 1 tablet by mouth daily., Disp: , Rfl:  .  Probiotic Product (PROBIOTIC PO), Take 1 tablet by mouth daily., Disp: , Rfl:    Allergies  Allergen Reactions  . Ibuprofen Other (See Comments)    "It intensifies my pain." Made pain and symptoms worse. "It intensifies my pain."  . Morphine Itching    Red blotches on skin and burning.  . Morphine And Related Itching and Other (See Comments)    Reaction burns Reaction burns  . Penicillin G Itching  . Penicillins Hives and Itching    Has patient had a PCN reaction causing immediate rash, facial/tongue/throat  swelling, SOB or lightheadedness with hypotension: Yes Has patient had a PCN reaction causing severe rash involving mucus membranes or skin necrosis: No Has patient had a PCN reaction that required hospitalization No Has patient had a PCN reaction occurring within the last 10 years: No If all of the above answers are "NO", then may proceed with Cephalosporin use.   . Adhesive [Tape] Itching    Itching and burning at site Itching and burning at site  . Pseudoephedrine Other (See Comments) and Palpitations    Speed up heart rate Speed up heart rate     Review of Systems  Constitutional: Negative.   Respiratory: Negative.   Cardiovascular: Negative for chest pain, palpitations and leg swelling.  Neurological: Negative for dizziness and headaches.  Psychiatric/Behavioral: Negative.      Today's Vitals   06/27/19 1613  BP:  118/84  Pulse: 77  Temp: 98.3 F (36.8 C)  TempSrc: Oral  Weight: 201 lb 9.6 oz (91.4 kg)  Height: 5\' 8"  (1.727 m)   Body mass index is 30.65 kg/m.   Objective:  Physical Exam Vitals reviewed.  Constitutional:      Appearance: Normal appearance.  Cardiovascular:     Rate and Rhythm: Normal rate and regular rhythm.     Heart sounds: Normal heart sounds. No murmur.  Pulmonary:     Effort: Pulmonary effort is normal. No respiratory distress.     Breath sounds: Normal breath sounds.  Neurological:     General: No focal deficit present.     Mental Status: She is alert and oriented to person, place, and time.     Cranial Nerves: No cranial nerve deficit.  Psychiatric:        Mood and Affect: Mood normal.        Behavior: Behavior normal.        Thought Content: Thought content normal.        Judgment: Judgment normal.         Assessment And Plan:     1. Vitamin B12 deficiency  2nd of 4 weekly vitamin B12 injection - cyanocobalamin ((VITAMIN B-12)) injection 1,000 mcg  2. Shortness of breath  Intermittent shortness of breath with exertion, she does admit to gaining a few pounds  Lung sounds are normal  She is encouraged to walk short walks first to improve stamina if not better will consider additional studies  , FNP    THE PATIENT IS ENCOURAGED TO PRACTICE SOCIAL DISTANCING DUE TO THE COVID-19 PANDEMIC.

## 2019-06-28 ENCOUNTER — Ambulatory Visit: Payer: Medicare HMO | Admitting: Nurse Practitioner

## 2019-07-04 ENCOUNTER — Telehealth: Payer: Self-pay

## 2019-07-05 ENCOUNTER — Encounter: Payer: Self-pay | Admitting: Nurse Practitioner

## 2019-07-05 ENCOUNTER — Telehealth: Payer: Self-pay

## 2019-07-05 ENCOUNTER — Other Ambulatory Visit: Payer: Self-pay

## 2019-07-05 ENCOUNTER — Ambulatory Visit (INDEPENDENT_AMBULATORY_CARE_PROVIDER_SITE_OTHER): Payer: Medicare HMO | Admitting: Nurse Practitioner

## 2019-07-05 VITALS — BP 140/88 | HR 84 | Temp 98.3°F | Ht 68.6 in | Wt 202.6 lb

## 2019-07-05 DIAGNOSIS — Z87898 Personal history of other specified conditions: Secondary | ICD-10-CM | POA: Diagnosis not present

## 2019-07-05 DIAGNOSIS — E538 Deficiency of other specified B group vitamins: Secondary | ICD-10-CM

## 2019-07-05 DIAGNOSIS — W19XXXA Unspecified fall, initial encounter: Secondary | ICD-10-CM

## 2019-07-05 DIAGNOSIS — Z1231 Encounter for screening mammogram for malignant neoplasm of breast: Secondary | ICD-10-CM

## 2019-07-05 DIAGNOSIS — R42 Dizziness and giddiness: Secondary | ICD-10-CM

## 2019-07-05 DIAGNOSIS — M25551 Pain in right hip: Secondary | ICD-10-CM | POA: Diagnosis not present

## 2019-07-05 MED ORDER — CYANOCOBALAMIN 1000 MCG/ML IJ SOLN
1000.0000 ug | Freq: Once | INTRAMUSCULAR | Status: AC
  Administered 2019-07-05: 1000 ug via INTRAMUSCULAR

## 2019-07-05 MED ORDER — PREDNISONE 10 MG (21) PO TBPK: ORAL_TABLET | ORAL | 0 refills | Status: DC

## 2019-07-05 NOTE — Progress Notes (Signed)
This visit occurred during the SARS-CoV-2 public health emergency.  Safety protocols were in place, including screening questions prior to the visit, additional usage of staff PPE, and extensive cleaning of exam room while observing appropriate contact time as indicated for disinfecting solutions.  Subjective:     Patient ID: Katherine Gregory , female    DOB: [birth year] , 54 y.o.   MRN: [id number]   Chief Complaint  Patient presents with  . B12 Injection    Dizziness last week     HPI  She feels more energy but still having some fatigue. She is having spurts of energy.    Dizziness This is a new problem. Episode onset: one day last week. Associated symptoms include arthralgias (right hip ). Pertinent negatives include no abdominal pain, chest pain, fatigue or headaches. She has tried nothing for the symptoms.     Past Medical History:  Diagnosis Date  . Acid reflux   . Bleeding gastric ulcer   . Cellulitis   . Cervical back pain with evidence of disc disease   . Depression   . Dysuria   . FATIGUE   . Fibromyalgia   . FLANK PAIN, RIGHT   . GOITER   . HEADACHES, HX OF   . Huntington's disease ([place])   . IBS (irritable bowel syndrome)   . Palpitations   . Post partum depression   . Seasonal allergies   . URTICARIA   . Vertigo   . Vitamin D deficiency      Family History  Problem Relation Age of Onset  . Hypertension Mother   . Huntington's disease Mother        deceased August 04, 2022  . Diabetes Father   . Hypertension Father   . Heart disease Father   . Scoliosis Sister   . Huntington's disease Sister   . Diabetes Brother   . Hypertension Brother   . Scoliosis Brother   . Skin cancer Maternal Grandmother      Current Outpatient Medications:  .  B Complex Vitamins (VITAMIN B COMPLEX PO), Take by mouth. Take 1 tablet daily, Disp: , Rfl:  .  Calcium Carb-Cholecalciferol (CALCIUM 1000 + D) 1000-800 MG-UNIT TABS, Take by mouth. Take 1 tablet daily, Disp: , Rfl:   .  Cholecalciferol (VITAMIN D3) 50 MCG (2000 UT) TABS, Take by mouth. Take 1 tablet daily, Disp: , Rfl:  .  DULoxetine (CYMBALTA) 30 MG capsule, Take 2 tablets by mouth daily, Disp: 60 capsule, Rfl: 2 .  Multiple Vitamin (MULTIVITAMIN ADULT PO), Take by mouth. Take 1 tablet daily, Disp: , Rfl:  .  Multiple Vitamins-Minerals (AIRBORNE PO), Take 4 tablets by mouth daily., Disp: , Rfl:  .  Multiple Vitamins-Minerals (ZINC PO), Take 60 mg by mouth daily., Disp: , Rfl:  .  omeprazole (PRILOSEC) 40 MG capsule, Take 1 capsule (40 mg total) by mouth daily., Disp: 90 capsule, Rfl: 1 .  tetrabenazine (XENAZINE) 12.5 MG tablet, Take 1 tablet (12.5 mg total) by mouth 2 (two) times daily., Disp: 60 tablet, Rfl: 11 .  Wheat Dextrin (BENEFIBER PO), Take 1 tablet by mouth daily., Disp: , Rfl:   Current Facility-Administered Medications:  .  cyanocobalamin ((VITAMIN B-12)) injection 1,000 mcg, 1,000 mcg, Intramuscular, Once, [name], FNP   Allergies  Allergen Reactions  . Ibuprofen Other (See Comments)    "It intensifies my pain." Made pain and symptoms worse. "It intensifies my pain."  . Morphine Itching    Red blotches on skin and burning.  [name]  Morphine And Related Itching and Other (See Comments)    Reaction burns Reaction burns  . Penicillin G Itching  . Penicillins Hives and Itching    Has patient had a PCN reaction causing immediate rash, facial/tongue/throat swelling, SOB or lightheadedness with hypotension: Yes Has patient had a PCN reaction causing severe rash involving mucus membranes or skin necrosis: No Has patient had a PCN reaction that required hospitalization No Has patient had a PCN reaction occurring within the last 10 years: No If all of the above answers are "NO", then may proceed with Cephalosporin use.   . Adhesive [Tape] Itching    Itching and burning at site Itching and burning at site  . Pseudoephedrine Other (See Comments) and Palpitations    Speed up heart  rate Speed up heart rate     Review of Systems  Constitutional: Negative for fatigue.  Respiratory: Negative.   Cardiovascular: Negative.  Negative for chest pain, palpitations and leg swelling.  Gastrointestinal: Negative for abdominal pain.  Endocrine: Negative for polydipsia, polyphagia and polyuria.  Musculoskeletal: Positive for arthralgias (right hip ).  Neurological: Positive for dizziness. Negative for headaches.  Psychiatric/Behavioral: Negative.  Negative for agitation and confusion.     Today's Vitals   07/05/19 1512  BP: 140/88  Pulse: 84  Temp: 98.3 F (36.8 C)  Weight: 202 lb 9.6 oz (91.9 kg)  Height: 5' 8.6" (1.742 m)   Body mass index is 30.27 kg/m.   Objective:  Physical Exam Cardiovascular:     Rate and Rhythm: Normal rate and regular rhythm.     Pulses: Normal pulses.     Heart sounds: Normal heart sounds. No murmur.  Pulmonary:     Effort: Pulmonary effort is normal. No respiratory distress.     Breath sounds: Normal breath sounds.  Musculoskeletal:        General: Tenderness (mild tenderness to right hip and with internal rotation) present. No swelling.  Skin:    General: Skin is warm.     Capillary Refill: Capillary refill takes less than 2 seconds.  Neurological:     General: No focal deficit present.     Mental Status: She is oriented to person, place, and time.     Cranial Nerves: No cranial nerve deficit.  Psychiatric:        Mood and Affect: Mood normal.        Behavior: Behavior normal.        Thought Content: Thought content normal.        Judgment: Judgment normal.         Assessment And Plan:     1. Vitamin B12 deficiency  She is here for her 3/4 weekly vitamin B12 injections.   Will check vitamin b12 level next week - cyanocobalamin ((VITAMIN B-12)) injection 1,000 mcg - Vitamin B12; Future  2. Fall, initial encounter  She fell over 6 months ago and injured her right hip continues to have pain when she lays on her side.    3. Right hip pain  Will treat with prednisone if not better she will go for hip xray - DG Hip Unilat W OR W/O Pelvis 2-3 Views Right; Future - predniSONE (STERAPRED UNI-PAK 21 TAB) 10 MG (21) TBPK tablet; Take as directed  Dispense: 21 tablet; Refill: 0  4. Encounter for screening mammogram for breast cancer  Pt instructed on Self Breast Exam.According to ACOG guidelines Women aged 50 and older are recommended to get an annual mammogram. Form completed and given  to patient contact the [facility] for appointment scheduing.   Pt encouraged to get annual mammogram - MM DIGITAL SCREENING BILATERAL; Future  5. Dizziness  This has resolved   I have encouraged her to stay well hydrated with water.     Arnette Felts, FNP    THE PATIENT IS ENCOURAGED TO PRACTICE SOCIAL DISTANCING DUE TO THE COVID-19 PANDEMIC.

## 2019-07-05 NOTE — Progress Notes (Signed)
This encounter was created in error - please disregard.

## 2019-07-06 ENCOUNTER — Ambulatory Visit: Payer: Self-pay

## 2019-07-06 ENCOUNTER — Telehealth: Payer: Self-pay

## 2019-07-06 DIAGNOSIS — G10 Huntington's disease: Secondary | ICD-10-CM

## 2019-07-06 DIAGNOSIS — F339 Major depressive disorder, recurrent, unspecified: Secondary | ICD-10-CM

## 2019-07-09 NOTE — Chronic Care Management (AMB) (Signed)
  Chronic Care Management   Outreach Note  07/09/2019 Name: Katherine Gregory MRN: [id number] DOB: 03/12/1966  Referred by: Arnette Felts, FNP Reason for referral : Chronic Care Management (RQ Initial Call - Huntingtons, depression )   An unsuccessful telephone outreach was attempted today. The patient was referred to the case management team for assistance with care management and care coordination.   Follow Up Plan: A HIPPA compliant phone message was left for the patient providing contact information and requesting a return call.  Telephone follow up appointment with care management team member scheduled for:07/27/19  Delsa Sale, RN, BSN, CCM Care Management Coordinator [facility]  Direct Phone: [phone]

## 2019-07-09 NOTE — Patient Instructions (Signed)
Visit Information  

## 2019-07-10 ENCOUNTER — Ambulatory Visit: Payer: Self-pay | Admitting: Nurse Practitioner

## 2019-07-10 ENCOUNTER — Telehealth: Payer: Self-pay

## 2019-07-10 NOTE — Telephone Encounter (Signed)
Patient called stating she noticed a spike in her energy she has been really tired on Sunday she slept until 6:30pm and was unable to get up on Monday she stated she doesn't think the vitamin b12 is working.  I returned her call and advised her that we are unsure of why she is having fatigue it could be due to her huntingtons disease but we will check a thyroid antibodies on her just to make sure also she doesn't have to continue the vitamin B12 if she doesn't want to pt stated she would like to get the last B12 and wait her bloodwork to come back so we could get a better understanding of what is going on. YRL,RMA

## 2019-07-12 ENCOUNTER — Other Ambulatory Visit: Payer: Self-pay

## 2019-07-12 ENCOUNTER — Encounter: Payer: Self-pay | Admitting: Nurse Practitioner

## 2019-07-12 ENCOUNTER — Ambulatory Visit (INDEPENDENT_AMBULATORY_CARE_PROVIDER_SITE_OTHER): Payer: Medicare HMO | Admitting: Nurse Practitioner

## 2019-07-12 VITALS — BP 122/86 | HR 84 | Temp 98.7°F | Ht 68.6 in | Wt 202.6 lb

## 2019-07-12 DIAGNOSIS — E538 Deficiency of other specified B group vitamins: Secondary | ICD-10-CM

## 2019-07-12 DIAGNOSIS — R5383 Other fatigue: Secondary | ICD-10-CM

## 2019-07-12 NOTE — Progress Notes (Addendum)
This visit occurred during the SARS-CoV-2 public health emergency.  Safety protocols were in place, including screening questions prior to the visit, additional usage of staff PPE, and extensive cleaning of exam room while observing appropriate contact time as indicated for disinfecting solutions.  Subjective:     Patient ID: Katherine Gregory , female    DOB: [birth year] , 54 y.o.   MRN: [id number]   Chief Complaint  Patient presents with  . Fatigue    HPI  Fatigue - she had extreme fatigue on Sunday after helping her daughter clean and do things around the house.   Melatonin has not worked in the past.  She has taken a sleep aid in the past but was stopped due to memory problems.   She reports in 2018 she had been bedridden thought to be related to Huntington's or her inflamed gallbladder. She had chronic fatigue during that time thought to be related to poor sleep and possible chronic illnesses.  She reports she had improved.      Past Medical History:  Diagnosis Date  . Acid reflux   . Bleeding gastric ulcer   . Cellulitis   . Cervical back pain with evidence of disc disease   . Depression   . Dysuria   . FATIGUE   . Fibromyalgia   . FLANK PAIN, RIGHT   . GOITER   . HEADACHES, HX OF   . Huntington's disease (HCC)   . IBS (irritable bowel syndrome)   . Palpitations   . Post partum depression   . Seasonal allergies   . URTICARIA   . Vertigo   . Vitamin D deficiency      Family History  Problem Relation Age of Onset  . Hypertension Mother   . Huntington's disease Mother        deceased 08/14/2022  . Diabetes Father   . Hypertension Father   . Heart disease Father   . Scoliosis Sister   . Huntington's disease Sister   . Diabetes Brother   . Hypertension Brother   . Scoliosis Brother   . Skin cancer Maternal Grandmother      Current Outpatient Medications:  .  B Complex Vitamins (VITAMIN B COMPLEX PO), Take by mouth. Take 1 tablet daily, Disp: , Rfl:  .   Calcium Carb-Cholecalciferol (CALCIUM 1000 + D) 1000-800 MG-UNIT TABS, Take by mouth. Take 1 tablet daily, Disp: , Rfl:  .  Cholecalciferol (VITAMIN D3) 50 MCG (2000 UT) TABS, Take by mouth. Take 1 tablet daily, Disp: , Rfl:  .  DULoxetine (CYMBALTA) 30 MG capsule, Take 2 tablets by mouth daily, Disp: 60 capsule, Rfl: 2 .  Multiple Vitamin (MULTIVITAMIN ADULT PO), Take by mouth. Take 1 tablet daily, Disp: , Rfl:  .  Multiple Vitamins-Minerals (AIRBORNE PO), Take 4 tablets by mouth daily., Disp: , Rfl:  .  Multiple Vitamins-Minerals (ZINC PO), Take 60 mg by mouth daily., Disp: , Rfl:  .  omeprazole (PRILOSEC) 40 MG capsule, Take 1 capsule (40 mg total) by mouth daily., Disp: 90 capsule, Rfl: 1 .  tetrabenazine (XENAZINE) 12.5 MG tablet, Take 1 tablet (12.5 mg total) by mouth 2 (two) times daily., Disp: 60 tablet, Rfl: 11 .  Wheat Dextrin (BENEFIBER PO), Take 1 tablet by mouth daily., Disp: , Rfl:  .  predniSONE (STERAPRED UNI-PAK 21 TAB) 10 MG (21) TBPK tablet, Take as directed (Patient not taking: Reported on 07/12/2019), Disp: 21 tablet, Rfl: 0   Allergies  Allergen Reactions  . Ibuprofen  Other (See Comments)    "It intensifies my pain." Made pain and symptoms worse. "It intensifies my pain."  . Morphine Itching    Red blotches on skin and burning.  . Morphine And Related Itching and Other (See Comments)    Reaction burns Reaction burns  . Penicillin G Itching  . Penicillins Hives and Itching    Has patient had a PCN reaction causing immediate rash, facial/tongue/throat swelling, SOB or lightheadedness with hypotension: Yes Has patient had a PCN reaction causing severe rash involving mucus membranes or skin necrosis: No Has patient had a PCN reaction that required hospitalization No Has patient had a PCN reaction occurring within the last 10 years: No If all of the above answers are "NO", then may proceed with Cephalosporin use.   . Adhesive [Tape] Itching    Itching and burning at  site Itching and burning at site  . Pseudoephedrine Other (See Comments) and Palpitations    Speed up heart rate Speed up heart rate     Review of Systems  Constitutional: Negative.   Respiratory: Negative.   Cardiovascular: Negative.  Negative for chest pain, palpitations and leg swelling.  Neurological: Negative for dizziness and headaches.  Psychiatric/Behavioral: Negative.      Today's Vitals   07/12/19 1534  BP: 122/86  Pulse: 84  Temp: 98.7 F (37.1 C)  TempSrc: Oral  Weight: 202 lb 9.6 oz (91.9 kg)  Height: 5' 8.6" (1.742 m)  PainSc: 0-No pain   Body mass index is 30.27 kg/m.   Objective:  Physical Exam Constitutional:      Appearance: Normal appearance.  Cardiovascular:     Rate and Rhythm: Normal rate and regular rhythm.     Pulses: Normal pulses.     Heart sounds: Normal heart sounds. No murmur.  Pulmonary:     Effort: Pulmonary effort is normal.     Breath sounds: Normal breath sounds.  Neurological:     General: No focal deficit present.     Mental Status: She is alert and oriented to person, place, and time.     Cranial Nerves: No cranial nerve deficit.  Psychiatric:        Mood and Affect: Mood normal.        Behavior: Behavior normal.        Thought Content: Thought content normal.        Judgment: Judgment normal.         Assessment And Plan:     1. Fatigue, unspecified type  Will check thyroid antibodies and metabolic causes  EKG done with HR 79 - Thyroid antibodies - Novel Coronavirus, NAA ([facility]) - CBC with Differential/Platelet - TSH - Parathyroid Hormone, Intact w/Ca - EKG 12-Lead  2. Vitamin B12 deficiency  She does not feel this has been effective, was being used for her memory as well and was at high normal. Will not administer vitamin B12.     [name], FNP    THE PATIENT IS ENCOURAGED TO PRACTICE SOCIAL DISTANCING DUE TO THE COVID-19 PANDEMIC.

## 2019-07-13 LAB — CBC WITH DIFFERENTIAL/PLATELET
Basophils Absolute: 0 10*3/uL (ref 0.0–0.2)
Basos: 1 %
EOS (ABSOLUTE): 0.1 10*3/uL (ref 0.0–0.4)
Eos: 1 %
Hematocrit: 37.7 % (ref 34.0–46.6)
Hemoglobin: 12.9 g/dL (ref 11.1–15.9)
Immature Grans (Abs): 0 10*3/uL (ref 0.0–0.1)
Immature Granulocytes: 0 %
Lymphocytes Absolute: 4.1 10*3/uL — ABNORMAL HIGH (ref 0.7–3.1)
Lymphs: 57 %
MCH: 29 pg (ref 26.6–33.0)
MCHC: 34.2 g/dL (ref 31.5–35.7)
MCV: 85 fL (ref 79–97)
Monocytes Absolute: 0.7 10*3/uL (ref 0.1–0.9)
Monocytes: 9 %
Neutrophils Absolute: 2.3 10*3/uL (ref 1.4–7.0)
Neutrophils: 32 %
Platelets: 317 10*3/uL (ref 150–450)
RBC: 4.45 x10E6/uL (ref 3.77–5.28)
RDW: 12.6 % (ref 11.7–15.4)
WBC: 7.3 10*3/uL (ref 3.4–10.8)

## 2019-07-13 LAB — THYROID ANTIBODIES
Thyroglobulin Antibody: <1 IU/mL (ref 0.0–0.9)
Thyroperoxidase Ab SerPl-aCnc: <9 IU/mL (ref 0–34)

## 2019-07-13 LAB — PTH, INTACT AND CALCIUM
Calcium: 9.5 mg/dL (ref 8.7–10.2)
PTH: 36 pg/mL (ref 15–65)

## 2019-07-13 LAB — NOVEL CORONAVIRUS, NAA: SARS-CoV-2, NAA: NOT DETECTED

## 2019-07-13 LAB — TSH: TSH: 2.06 u[IU]/mL (ref 0.450–4.500)

## 2019-07-16 ENCOUNTER — Telehealth: Payer: Self-pay

## 2019-07-16 NOTE — Telephone Encounter (Signed)
Patient called stating she didn't do anything different this weekend but she ad welps on her arm from the injection, itching and her hands are sore. I confirmed with the patient if she got the vitamin b12 shot at her last visit and she did not. Patient stated all she has are marks from her scratching. I advised pt to call us if her symptoms come back so we can schedule a same day appt. YL,RMA

## 2019-07-17 ENCOUNTER — Ambulatory Visit: Payer: Self-pay

## 2019-07-17 DIAGNOSIS — F339 Major depressive disorder, recurrent, unspecified: Secondary | ICD-10-CM

## 2019-07-17 DIAGNOSIS — G10 Huntington's disease: Secondary | ICD-10-CM

## 2019-07-17 NOTE — Patient Instructions (Signed)
Social Worker Visit Information  Goals we discussed today:  Goals Addressed            This Visit's Progress   . Assist with care coordination needs related to social determinants of health   On track    Current Barriers:  . Huntington's Disease impacting ability to perform iADL's . Limited social support . Lacks knowledge of local community resources to assist with transportation . Limited income resulting in food insecurity  Social Work Clinical Goal(s):  [name] Over the next 45 days the patient will become more knowledgeable of local food pantry's to assist with food insecurity . Over the next 45 days the patient will work with SW to become more knowledgeable of transportation resources Goal Met 07/17/2019 . Over the next 60 days the patient will work with SW to complete and submit a [employer]AT application Goal Met 08/22/4358  CCM SW Interventions: Completed 07/17/2019 . Outbound call placed to the patient to assess progression of patient stated goal . Determined the patient has received communication from [employer] regarding application but has yet to open letter o Advised the patient to open the letter to determine approval status . Reviewed health plan transportation benefit with the patient . Encouraged the patient to contact SW with future resource needs . Scheduled outbound call to the patient over the next month to assist with care coordination needs  Patient Self Care Activities:  . Patient verbalizes understanding of plan to work with CM team to address care coordination needs . Self administers medications as prescribed . Attends all scheduled provider appointments . Calls pharmacy for medication refills  Please see past updates related to this goal by clicking on the "Past Updates" button in the selected goal          Follow Up Plan: SW will follow up with patient by phone over the next month  Daneen Schick, BSW, CDP Social Worker, Certified Dementia Practitioner [place] [facility] [phone]

## 2019-07-17 NOTE — Chronic Care Management (AMB) (Signed)
Chronic Care Management    Social Work Follow Up Note  07/17/2019 Name: Katherine Gregory MRN: [id number] DOB: 1965/05/18  Katherine Gregory is a 54 y.o. year old female who is a primary care patient of [name], [place]. The CCM team was consulted for assistance with care coordination.   Review of patient status, including review of consultants reports, other relevant assessments, and collaboration with appropriate care team members and the patient's provider was performed as part of comprehensive patient evaluation and provision of chronic care management services.    SDOH (Social Determinants of Health) assessments performed: No    Outpatient Encounter Medications as of 07/17/2019  Medication Sig  . B Complex Vitamins (VITAMIN B COMPLEX PO) Take by mouth. Take 1 tablet daily  . Calcium Carb-Cholecalciferol (CALCIUM 1000 + D) 1000-800 MG-UNIT TABS Take by mouth. Take 1 tablet daily  . Cholecalciferol (VITAMIN D3) 50 MCG (2000 UT) TABS Take by mouth. Take 1 tablet daily  . DULoxetine (CYMBALTA) 30 MG capsule Take 2 tablets by mouth daily  . Multiple Vitamin (MULTIVITAMIN ADULT PO) Take by mouth. Take 1 tablet daily  . Multiple Vitamins-Minerals (AIRBORNE PO) Take 4 tablets by mouth daily.  . Multiple Vitamins-Minerals (ZINC PO) Take 60 mg by mouth daily.  [name] omeprazole (PRILOSEC) 40 MG capsule Take 1 capsule (40 mg total) by mouth daily.  . predniSONE (STERAPRED UNI-PAK 21 TAB) 10 MG (21) TBPK tablet Take as directed (Patient not taking: Reported on 07/12/2019)  . tetrabenazine (XENAZINE) 12.5 MG tablet Take 1 tablet (12.5 mg total) by mouth 2 (two) times daily.  . Wheat Dextrin (BENEFIBER PO) Take 1 tablet by mouth daily.   No facility-administered encounter medications on file as of 07/17/2019.     Goals Addressed            This Visit's Progress   . Assist with care coordination needs related to social determinants of health   On track    Current Barriers:   . Huntington's Disease impacting ability to perform iADL's . Limited social support . Lacks knowledge of local community resources to assist with transportation . Limited income resulting in food insecurity  Social Work Clinical Goal(s):  [name] Over the next 45 days the patient will become more knowledgeable of local food pantry's to assist with food insecurity . Over the next 45 days the patient will work with SW to become more knowledgeable of transportation resources Goal Met 07/17/2019 . Over the next 60 days the patient will work with SW to complete and submit a SCAT application Goal Met 01/31/2682  CCM SW Interventions: Completed 07/17/2019 . Outbound call placed to the patient to assess progression of patient stated goal . Determined the patient has received communication from [employer] regarding application but has yet to open letter o Advised the patient to open the letter to determine approval status . Reviewed health plan transportation benefit with the patient . Encouraged the patient to contact SW with future resource needs . Scheduled outbound call to the patient over the next month to assist with care coordination needs  Patient Self Care Activities:  . Patient verbalizes understanding of plan to work with CM team to address care coordination needs . Self administers medications as prescribed . Attends all scheduled provider appointments . Calls pharmacy for medication refills  Please see past updates related to this goal by clicking on the "Past Updates" button in the selected goal          Follow Up Plan:  SW will follow up with patient by phone over the next month.   Daneen Schick, BSW, CDP Social Worker, Certified Dementia Practitioner [place] [facility] [place] [facility] [facility] [phone]  Total time spent performing care coordination and/or care management activities with the patient by phone or face to face = 7 minutes.

## 2019-07-18 ENCOUNTER — Encounter: Payer: Self-pay | Admitting: Nurse Practitioner

## 2019-07-23 ENCOUNTER — Ambulatory Visit: Payer: Self-pay

## 2019-07-23 DIAGNOSIS — F331 Major depressive disorder, recurrent, moderate: Secondary | ICD-10-CM

## 2019-07-23 DIAGNOSIS — G10 Huntington's disease: Secondary | ICD-10-CM

## 2019-07-23 NOTE — Chronic Care Management (AMB) (Signed)
  Chronic Care Management    Social Work Follow Up Note  07/23/2019 Name: Katherine Gregory MRN: [id number] DOB: 08/09/1965  Katherine Gregory is a 54 y.o. year old female who is a primary care patient of Arnette Felts, FNP. The CCM team was consulted for assistance with care coordination.   Review of patient status, including review of consultants reports, other relevant assessments, and collaboration with appropriate care team members and the patient's provider was performed as part of comprehensive patient evaluation and provision of chronic care management services.    SW placed a successful outbound call to the patient in response to a voice message requesting a return call. SW assisted the patient by reviewing SCAT transportation benefit including how to schedule a ride and what type of transportation services are offered.    Outpatient Encounter Medications as of 07/23/2019  Medication Sig  . B Complex Vitamins (VITAMIN B COMPLEX PO) Take by mouth. Take 1 tablet daily  . Calcium Carb-Cholecalciferol (CALCIUM 1000 + D) 1000-800 MG-UNIT TABS Take by mouth. Take 1 tablet daily  . Cholecalciferol (VITAMIN D3) 50 MCG (2000 UT) TABS Take by mouth. Take 1 tablet daily  . DULoxetine (CYMBALTA) 30 MG capsule Take 2 tablets by mouth daily  . Multiple Vitamin (MULTIVITAMIN ADULT PO) Take by mouth. Take 1 tablet daily  . Multiple Vitamins-Minerals (AIRBORNE PO) Take 4 tablets by mouth daily.  . Multiple Vitamins-Minerals (ZINC PO) Take 60 mg by mouth daily.  [name] omeprazole (PRILOSEC) 40 MG capsule Take 1 capsule (40 mg total) by mouth daily.  . predniSONE (STERAPRED UNI-PAK 21 TAB) 10 MG (21) TBPK tablet Take as directed (Patient not taking: Reported on 07/12/2019)  . tetrabenazine (XENAZINE) 12.5 MG tablet Take 1 tablet (12.5 mg total) by mouth 2 (two) times daily.  . Wheat Dextrin (BENEFIBER PO) Take 1 tablet by mouth daily.   No facility-administered encounter medications on file as of  07/23/2019.      Follow Up Plan: SW will follow up with patient by phone over the next month as previously scheduled.  Bevelyn Ngo, BSW, CDP Social Worker, Certified Dementia Practitioner [facility] [phone]

## 2019-07-24 ENCOUNTER — Ambulatory Visit: Payer: Medicare HMO | Admitting: Psychology

## 2019-07-26 ENCOUNTER — Ambulatory Visit: Payer: Medicare HMO

## 2019-07-26 ENCOUNTER — Encounter: Payer: Medicare HMO | Admitting: Nurse Practitioner

## 2019-07-27 ENCOUNTER — Other Ambulatory Visit: Payer: Self-pay

## 2019-07-27 ENCOUNTER — Encounter: Payer: Self-pay | Admitting: Nurse Practitioner

## 2019-07-27 ENCOUNTER — Telehealth: Payer: Self-pay

## 2019-07-27 ENCOUNTER — Ambulatory Visit (INDEPENDENT_AMBULATORY_CARE_PROVIDER_SITE_OTHER): Payer: Medicare HMO

## 2019-07-27 DIAGNOSIS — G10 Huntington's disease: Secondary | ICD-10-CM

## 2019-07-27 DIAGNOSIS — M797 Fibromyalgia: Secondary | ICD-10-CM

## 2019-07-27 DIAGNOSIS — F331 Major depressive disorder, recurrent, moderate: Secondary | ICD-10-CM

## 2019-07-31 ENCOUNTER — Ambulatory Visit: Payer: Medicare HMO | Admitting: Psychology

## 2019-07-31 NOTE — Chronic Care Management (AMB) (Signed)
Chronic Care Management   Initial Visit Note  07/30/2019 Name: Katherine Gregory MRN: [id number] DOB: [birth year]  Referred by: Arnette Felts, FNP Reason for referral : Chronic Care Management (RQ #2 Initial Call )   Katherine Gregory is a 54 y.o. year old female who is a primary care patient of Arnette Felts, FNP. The CCM team was consulted for assistance with chronic disease management and care coordination needs related to Huntington's Disease, Depression   Review of patient status, including review of consultants reports, relevant laboratory and other test results, and collaboration with appropriate care team members and the patient's provider was performed as part of comprehensive patient evaluation and provision of chronic care management services.    SDOH (Social Determinants of Health) assessments performed: Yes See Care Plan activities for detailed interventions related to SDOH     Medications: Outpatient Encounter Medications as of 07/27/2019  Medication Sig  . B Complex Vitamins (VITAMIN B COMPLEX PO) Take by mouth. Take 1 tablet daily  . Calcium Carb-Cholecalciferol (CALCIUM 1000 + D) 1000-800 MG-UNIT TABS Take by mouth. Take 1 tablet daily  . Cholecalciferol (VITAMIN D3) 50 MCG (2000 UT) TABS Take by mouth. Take 1 tablet daily  . DULoxetine (CYMBALTA) 30 MG capsule Take 2 tablets by mouth daily  . Multiple Vitamin (MULTIVITAMIN ADULT PO) Take by mouth. Take 1 tablet daily  . Multiple Vitamins-Minerals (AIRBORNE PO) Take 4 tablets by mouth daily.  . Multiple Vitamins-Minerals (ZINC PO) Take 60 mg by mouth daily.  [name] omeprazole (PRILOSEC) 40 MG capsule Take 1 capsule (40 mg total) by mouth daily.  . predniSONE (STERAPRED UNI-PAK 21 TAB) 10 MG (21) TBPK tablet Take as directed (Patient not taking: Reported on 07/12/2019)  . tetrabenazine (XENAZINE) 12.5 MG tablet Take 1 tablet (12.5 mg total) by mouth 2 (two) times daily.  . Wheat Dextrin (BENEFIBER PO) Take 1 tablet  by mouth daily.   No facility-administered encounter medications on file as of 07/27/2019.     Objective:  Lab Results  Component Value Date   HGBA1C 5.4 05/29/2019   HGBA1C 5.9 04/28/2016   HGBA1C 5.6 11/16/2013   Lab Results  Component Value Date   LDLCALC 146 (H) 05/29/2019   CREATININE 1.11 (H) 05/29/2019   BP Readings from Last 3 Encounters:  07/12/19 122/86  07/05/19 140/88  06/27/19 118/84    Goals Addressed      Patient Stated   . "to continue to feel hopeful and encouraged" (pt-stated)       CARE PLAN ENTRY (see longtitudinal plan of care for additional care plan information)  Current Barriers:  [name] Knowledge Deficits related to evaluation and treatment of depression . Chronic Disease Management support and education needs related to Huntington's Disease, depression, Fibromyalgia   Nurse Case Manager Clinical Goal(s):  [name] Over the next 90 days, patient will work with the CCM team and PCP to address needs related to disease education and support to improve Self Health management of depression   CCM RN CM Interventions:  07/30/19 call completed with patient  . Evaluation of current treatment plan related to depression and patient's adherence to plan as established by provider. . Reviewed medications with patient and discussed patient is getting good effectiveness from Cymbalta and is feeling more hopeful and encouraged; Instructed patient to notify the CCM RN and or PCP promptly of new or worsening symptoms of depression  . Discussed plans with patient for ongoing care management follow up and provided patient with direct contact information  for care management team  Patient Self Care Activities:  . Self administers medications as prescribed . Attends all scheduled provider appointments . Calls pharmacy for medication refills . Calls provider office for new concerns or questions  Initial goal documentation    . "to have less stiffness in muscles and joints"  (pt-stated)       CARE PLAN ENTRY (see longtitudinal plan of care for additional care plan information)  Current Barriers:  [name] Knowledge Deficits related to evaluation and treatment of myalgia and decreased ROM . Chronic Disease Management support and education needs related to Huntington's disease; Depression, Fibromyalgia  Nurse Case Manager Clinical Goal(s):  [name] Over the next 90 days, patient will verbalize understanding of plan for evaluation and start of PT/OT for myalgia and decreased ROM  . Over the next 90 days, patient will work with the PCP to address needs related to disease education and support to improve Self Health management of myalgia and decreased ROM   CCM RN CM Interventions:  07/30/19 call completed with patient  . Evaluation of current treatment plan related to Fibromyalgia and Huntington's disease and patient's adherence to plan as established by provider. . Provided education to patient re: the [place]; Educated on this foundation and the benefits available; provided patient with the foundation website and encouraged registration and participation; Educated on the benefits of receiving PT/OT to improve ROM, strengthening and stamina . Reviewed medications with patient and discussed patient is adhering to her prescribed regimen; discussed indication, dosage and frequency; pt denies having noted SE  . Collaborated with [employer] [place]  regarding patient's request for assistance with navigating the [facility] department as she has attempted to transfer her Medicaid to [place] . Discussed plans with patient for ongoing care management follow up and provided patient with direct contact information for care management team  Patient Self Care Activities:  . Self administers medications as prescribed . Attends all scheduled provider appointments . Calls pharmacy for medication refills . Performs ADL's independently . Performs  IADL's independently . Calls provider office for new concerns or questions  Initial goal documentation      Other   . COMPLETED: Assist with Chronic Care Management and Care Coordination       Current Barriers:  [name] Knowledge Barriers related to resources and support available to address needs related to Chronic Care Management and Community Resources   Case Manager Clinical Goal(s):  [name] Over the next 30 days, patient will work with the CCM team to address needs related to Chronic disease management and Care Coordination   Interventions:  . Collaborated with BSW and initiated plan of care to address needs related to Chronic Care Management and Community Resources  Patient Self Care Activities:  . Self administers medications as prescribed . Attends all scheduled provider appointments . Calls pharmacy for medication refills . Calls provider office for new concerns or questions  Initial goal documentation      Plan:   Telephone follow up appointment with care management team member scheduled for:08/27/19  Barb Merino, RN, BSN, CCM  Care Management Coordinator [place] [facility]  Direct Phone: [phone]

## 2019-07-31 NOTE — Patient Instructions (Signed)
Visit Information  Goals Addressed      Patient Stated   . "to continue to feel hopeful and encouraged" (pt-stated)       CARE PLAN ENTRY (see longtitudinal plan of care for additional care plan information)  Current Barriers:  [name] Knowledge Deficits related to evaluation and treatment of depression . Chronic Disease Management support and education needs related to Huntington's Disease, depression, Fibromyalgia   Nurse Case Manager Clinical Goal(s):  [name] Over the next 90 days, patient will work with the CCM team and PCP to address needs related to disease education and support to improve Self Health management of depression   CCM RN CM Interventions:  07/30/19 call completed with patient  . Evaluation of current treatment plan related to depression and patient's adherence to plan as established by provider. . Reviewed medications with patient and discussed patient is getting good effectiveness from Cymbalta and is feeling more hopeful and encouraged; Instructed patient to notify the CCM RN and or PCP promptly of new or worsening symptoms of depression  . Discussed plans with patient for ongoing care management follow up and provided patient with direct contact information for care management team  Patient Self Care Activities:  . Self administers medications as prescribed . Attends all scheduled provider appointments . Calls pharmacy for medication refills . Calls provider office for new concerns or questions  Initial goal documentation     . "to have less stiffness in muscles and joints" (pt-stated)       CARE PLAN ENTRY (see longtitudinal plan of care for additional care plan information)  Current Barriers:  [name] Knowledge Deficits related to evaluation and treatment of myalgia and decreased ROM . Chronic Disease Management support and education needs related to Huntington's disease; Depression, Fibromyalgia  Nurse Case Manager Clinical Goal(s):  [name] Over the next 90 days, patient  will verbalize understanding of plan for evaluation and start of PT/OT for myalgia and decreased ROM  . Over the next 90 days, patient will work with the PCP to address needs related to disease education and support to improve Self Health management of myalgia and decreased ROM   CCM RN CM Interventions:  07/30/19 call completed with patient  . Evaluation of current treatment plan related to Fibromyalgia and Huntington's disease and patient's adherence to plan as established by provider. . Provided education to patient re: the [place]; Educated on this foundation and the benefits available; provided patient with the foundation website and encouraged registration and participation; Educated on the benefits of receiving PT/OT to improve ROM, strengthening and stamina . Reviewed medications with patient and discussed patient is adhering to her prescribed regimen; discussed indication, dosage and frequency; pt denies having noted SE  . Collaborated with embedded [place]  regarding patient's request for assistance with navigating the [facility] department as she has attempted to transfer her Medicaid to [place] . Discussed plans with patient for ongoing care management follow up and provided patient with direct contact information for care management team  Patient Self Care Activities:  . Self administers medications as prescribed . Attends all scheduled provider appointments . Calls pharmacy for medication refills . Performs ADL's independently . Performs IADL's independently . Calls provider office for new concerns or questions  Initial goal documentation       Other   . COMPLETED: Assist with Chronic Care Management and Care Coordination       Current Barriers:  [name] Knowledge Barriers related to resources  and support available to address needs related to Chronic Care Management and Community Resources   Case Manager Clinical Goal(s):  [name] Over the  next 30 days, patient will work with the CCM team to address needs related to Chronic disease management and Care Coordination   Interventions:  . Collaborated with BSW and initiated plan of care to address needs related to Chronic Care Management and Community Resources  Patient Self Care Activities:  . Self administers medications as prescribed . Attends all scheduled provider appointments . Calls pharmacy for medication refills . Calls provider office for new concerns or questions  Initial goal documentation        Patient verbalizes understanding of instructions provided today.   Telephone follow up appointment with care management team member scheduled for: 08/27/19  Delsa Sale, RN, BSN, CCM Care Management Coordinator [facility]  Direct Phone: [phone]

## 2019-08-01 ENCOUNTER — Ambulatory Visit (INDEPENDENT_AMBULATORY_CARE_PROVIDER_SITE_OTHER): Payer: Medicare HMO | Admitting: Nurse Practitioner

## 2019-08-01 ENCOUNTER — Encounter: Payer: Self-pay | Admitting: Nurse Practitioner

## 2019-08-01 ENCOUNTER — Ambulatory Visit (INDEPENDENT_AMBULATORY_CARE_PROVIDER_SITE_OTHER): Payer: Medicare HMO

## 2019-08-01 ENCOUNTER — Other Ambulatory Visit (HOSPITAL_COMMUNITY)
Admission: RE | Admit: 2019-08-01 | Discharge: 2019-08-01 | Disposition: A | Discharge status: Home / Self Care | Payer: Medicare HMO | Source: Ambulatory Visit | Attending: Nurse Practitioner | Admitting: Nurse Practitioner

## 2019-08-01 ENCOUNTER — Other Ambulatory Visit: Payer: Self-pay

## 2019-08-01 ENCOUNTER — Ambulatory Visit: Payer: Self-pay

## 2019-08-01 VITALS — BP 120/82 | HR 102 | Temp 98.6°F | Ht 67.4 in | Wt 201.2 lb

## 2019-08-01 VITALS — BP 120/82 | HR 102 | Temp 98.6°F | Ht 67.4 in | Wt 201.0 lb

## 2019-08-01 DIAGNOSIS — E538 Deficiency of other specified B group vitamins: Secondary | ICD-10-CM | POA: Diagnosis not present

## 2019-08-01 DIAGNOSIS — Z124 Encounter for screening for malignant neoplasm of cervix: Secondary | ICD-10-CM | POA: Diagnosis not present

## 2019-08-01 DIAGNOSIS — Z0000 Encounter for general adult medical examination without abnormal findings: Secondary | ICD-10-CM | POA: Diagnosis not present

## 2019-08-01 DIAGNOSIS — G10 Huntington's disease: Secondary | ICD-10-CM

## 2019-08-01 DIAGNOSIS — Z113 Encounter for screening for infections with a predominantly sexual mode of transmission: Secondary | ICD-10-CM | POA: Insufficient documentation

## 2019-08-01 DIAGNOSIS — F331 Major depressive disorder, recurrent, moderate: Secondary | ICD-10-CM

## 2019-08-01 LAB — POCT URINALYSIS DIPSTICK
Blood, UA: NEGATIVE
Glucose, UA: NEGATIVE
Ketones, UA: NEGATIVE
Nitrite, UA: NEGATIVE
Protein, UA: POSITIVE — AB
Spec Grav, UA: 1.025 (ref 1.010–1.025)
Urobilinogen, UA: 0.2 E.U./dL
pH, UA: 5.5 (ref 5.0–8.0)

## 2019-08-01 NOTE — Patient Instructions (Signed)
Katherine Gregory , Thank you for taking time to come for your Medicare Wellness Visit. I appreciate your ongoing commitment to your health goals. Please review the following plan we discussed and let me know if I can assist you in the future.   Screening recommendations/referrals: Colonoscopy: 05/2012 Mammogram: scheduling Bone Density: n/a Recommended yearly ophthalmology/optometry visit for glaucoma screening and checkup Recommended yearly dental visit for hygiene and checkup  Vaccinations: Influenza vaccine: 03/2019 Pneumococcal vaccine: n/a Tdap vaccine: 03/2012 Shingles vaccine: discussed    Advanced directives: Advance directive discussed with you today. I have provided a copy for you to complete at home and have notarized. Once this is complete please bring a copy in to our office so we can scan it into your chart.   Conditions/risks identified: obesity  Next appointment:   Preventive Care 40-64 Years, Female Preventive care refers to lifestyle choices and visits with your health care provider that can promote health and wellness. What does preventive care include?  A yearly physical exam. This is also called an annual well check.  Dental exams once or twice a year.  Routine eye exams. Ask your health care provider how often you should have your eyes checked.  Personal lifestyle choices, including:  Daily care of your teeth and gums.  Regular physical activity.  Eating a healthy diet.  Avoiding tobacco and drug use.  Limiting alcohol use.  Practicing safe sex.  Taking low-dose aspirin daily starting at age 33.  Taking vitamin and mineral supplements as recommended by your health care provider. What happens during an annual well check? The services and screenings done by your health care provider during your annual well check will depend on your age, overall health, lifestyle risk factors, and family history of disease. Counseling  Your health care provider may  ask you questions about your:  Alcohol use.  Tobacco use.  Drug use.  Emotional well-being.  Home and relationship well-being.  Sexual activity.  Eating habits.  Work and work Statistician.  Method of birth control.  Menstrual cycle.  Pregnancy history. Screening  You may have the following tests or measurements:  Height, weight, and BMI.  Blood pressure.  Lipid and cholesterol levels. These may be checked every 5 years, or more frequently if you are over 34 years old.  Skin check.  Lung cancer screening. You may have this screening every year starting at age 59 if you have a 30-pack-year history of smoking and currently smoke or have quit within the past 15 years.  Fecal occult blood test (FOBT) of the stool. You may have this test every year starting at age 71.  Flexible sigmoidoscopy or colonoscopy. You may have a sigmoidoscopy every 5 years or a colonoscopy every 10 years starting at age 69.  Hepatitis C blood test.  Hepatitis B blood test.  Sexually transmitted disease (STD) testing.  Diabetes screening. This is done by checking your blood sugar (glucose) after you have not eaten for a while (fasting). You may have this done every 1-3 years.  Mammogram. This may be done every 1-2 years. Talk to your health care provider about when you should start having regular mammograms. This may depend on whether you have a family history of breast cancer.  BRCA-related cancer screening. This may be done if you have a family history of breast, ovarian, tubal, or peritoneal cancers.  Pelvic exam and Pap test. This may be done every 3 years starting at age 81. Starting at age 58, this may be  done every 5 years if you have a Pap test in combination with an HPV test.  Bone density scan. This is done to screen for osteoporosis. You may have this scan if you are at high risk for osteoporosis. Discuss your test results, treatment options, and if necessary, the need for more  tests with your health care provider. Vaccines  Your health care provider may recommend certain vaccines, such as:  Influenza vaccine. This is recommended every year.  Tetanus, diphtheria, and acellular pertussis (Tdap, Td) vaccine. You may need a Td booster every 10 years.  Zoster vaccine. You may need this after age 60.  Pneumococcal 13-valent conjugate (PCV13) vaccine. You may need this if you have certain conditions and were not previously vaccinated.  Pneumococcal polysaccharide (PPSV23) vaccine. You may need one or two doses if you smoke cigarettes or if you have certain conditions. Talk to your health care provider about which screenings and vaccines you need and how often you need them. This information is not intended to replace advice given to you by your health care provider. Make sure you discuss any questions you have with your health care provider. Document Released: 05/23/2015 Document Revised: 01/14/2016 Document Reviewed: 02/25/2015 Elsevier Interactive Patient Education  2017 [employer]    Fall Prevention in the Home Falls can cause injuries. They can happen to people of all ages. There are many things you can do to make your home safe and to help prevent falls. What can I do on the outside of my home?  Regularly fix the edges of walkways and driveways and fix any cracks.  Remove anything that might make you trip as you walk through a door, such as a raised step or threshold.  Trim any bushes or trees on the path to your home.  Use bright outdoor lighting.  Clear any walking paths of anything that might make someone trip, such as rocks or tools.  Regularly check to see if handrails are loose or broken. Make sure that both sides of any steps have handrails.  Any raised decks and porches should have guardrails on the edges.  Have any leaves, snow, or ice cleared regularly.  Use sand or salt on walking paths during winter.  Clean up any spills in your  garage right away. This includes oil or grease spills. What can I do in the bathroom?  Use night lights.  Install grab bars by the toilet and in the tub and shower. Do not use towel bars as grab bars.  Use non-skid mats or decals in the tub or shower.  If you need to sit down in the shower, use a plastic, non-slip stool.  Keep the floor dry. Clean up any water that spills on the floor as soon as it happens.  Remove soap buildup in the tub or shower regularly.  Attach bath mats securely with double-sided non-slip rug tape.  Do not have throw rugs and other things on the floor that can make you trip. What can I do in the bedroom?  Use night lights.  Make sure that you have a light by your bed that is easy to reach.  Do not use any sheets or blankets that are too big for your bed. They should not hang down onto the floor.  Have a firm chair that has side arms. You can use this for support while you get dressed.  Do not have throw rugs and other things on the floor that can make you trip. What   can I do in the kitchen?  Clean up any spills right away.  Avoid walking on wet floors.  Keep items that you use a lot in easy-to-reach places.  If you need to reach something above you, use a strong step stool that has a grab bar.  Keep electrical cords out of the way.  Do not use floor polish or wax that makes floors slippery. If you must use wax, use non-skid floor wax.  Do not have throw rugs and other things on the floor that can make you trip. What can I do with my stairs?  Do not leave any items on the stairs.  Make sure that there are handrails on both sides of the stairs and use them. Fix handrails that are broken or loose. Make sure that handrails are as long as the stairways.  Check any carpeting to make sure that it is firmly attached to the stairs. Fix any carpet that is loose or worn.  Avoid having throw rugs at the top or bottom of the stairs. If you do have throw  rugs, attach them to the floor with carpet tape.  Make sure that you have a light switch at the top of the stairs and the bottom of the stairs. If you do not have them, ask someone to add them for you. What else can I do to help prevent falls?  Wear shoes that:  Do not have high heels.  Have rubber bottoms.  Are comfortable and fit you well.  Are closed at the toe. Do not wear sandals.  If you use a stepladder:  Make sure that it is fully opened. Do not climb a closed stepladder.  Make sure that both sides of the stepladder are locked into place.  Ask someone to hold it for you, if possible.  Clearly mark and make sure that you can see:  Any grab bars or handrails.  First and last steps.  Where the edge of each step is.  Use tools that help you move around (mobility aids) if they are needed. These include:  Canes.  Walkers.  Scooters.  Crutches.  Turn on the lights when you go into a dark area. Replace any light bulbs as soon as they burn out.  Set up your furniture so you have a clear path. Avoid moving your furniture around.  If any of your floors are uneven, fix them.  If there are any pets around you, be aware of where they are.  Review your medicines with your doctor. Some medicines can make you feel dizzy. This can increase your chance of falling. Ask your doctor what other things that you can do to help prevent falls. This information is not intended to replace advice given to you by your health care provider. Make sure you discuss any questions you have with your health care provider. Document Released: 02/20/2009 Document Revised: 10/02/2015 Document Reviewed: 05/31/2014 Elsevier Interactive Patient Education  2017 [employer].   

## 2019-08-01 NOTE — Progress Notes (Signed)
This visit occurred during the SARS-CoV-2 public health emergency.  Safety protocols were in place, including screening questions prior to the visit, additional usage of staff PPE, and extensive cleaning of exam room while observing appropriate contact time as indicated for disinfecting solutions.  Subjective:   Katherine Gregory is a 54 y.o. female who presents for an Initial Medicare Annual Wellness Visit.  Review of Systems    n/a  Cardiac Risk Factors include: sedentary lifestyle;obesity (BMI >30kg/m2)     Objective:    Today's Vitals   08/01/19 1216  BP: 120/82  Pulse: (!) 102  Temp: 98.6 F (37 C)  TempSrc: Oral  Weight: 201 lb (91.2 kg)  Height: 5' 7.4" (1.712 m)   Body mass index is 31.11 kg/m.  Advanced Directives 08/01/2019 10/29/2016 01/24/2015 01/23/2015 06/11/2014 08/17/2013 05/18/2012  Does Patient Have a Medical Advance Directive? No No No No No Patient does not have advance directive Patient does not have advance directive  Would patient like information on creating a medical advance directive? Yes (MAU/Ambulatory/Procedural Areas - Information given) - - No - patient declined information Yes - Educational materials given - -  Pre-existing out of facility DNR order (yellow form or pink MOST form) - - - - - No No    Current Medications (verified) Outpatient Encounter Medications as of 08/01/2019  Medication Sig  . B Complex Vitamins (VITAMIN B COMPLEX PO) Take by mouth. Take 1 tablet daily  . Calcium Carb-Cholecalciferol (CALCIUM 1000 + D) 1000-800 MG-UNIT TABS Take by mouth. Take 1 tablet daily  . Cholecalciferol (VITAMIN D3) 50 MCG (2000 UT) TABS Take by mouth. Take 1 tablet daily  . DULoxetine (CYMBALTA) 30 MG capsule Take 2 tablets by mouth daily  . Multiple Vitamin (MULTIVITAMIN ADULT PO) Take by mouth. Take 1 tablet daily  . Multiple Vitamins-Minerals (AIRBORNE PO) Take 4 tablets by mouth daily.  . Multiple Vitamins-Minerals (ZINC PO) Take 60 mg by mouth  daily.  [name] omeprazole (PRILOSEC) 40 MG capsule Take 1 capsule (40 mg total) by mouth daily.  [name] tetrabenazine (XENAZINE) 12.5 MG tablet Take 1 tablet (12.5 mg total) by mouth 2 (two) times daily.  . Wheat Dextrin (BENEFIBER PO) Take 1 tablet by mouth daily.   No facility-administered encounter medications on file as of 08/01/2019.    Allergies (verified) Ibuprofen, Morphine, Morphine and related, Penicillin g, Penicillins, Risperdal [risperidone], Adhesive [tape], Pseudoephedrine, and Vitamin [cyanocobalamin]   History: Past Medical History:  Diagnosis Date  . Acid reflux   . Bleeding gastric ulcer   . Cellulitis   . Cervical back pain with evidence of disc disease   . Depression   . Dysuria   . FATIGUE   . Fibromyalgia   . FLANK PAIN, RIGHT   . GOITER   . HEADACHES, HX OF   . Huntington's disease ([place])   . IBS (irritable bowel syndrome)   . Palpitations   . Post partum depression   . Seasonal allergies   . URTICARIA   . Vertigo   . Vitamin D deficiency    Past Surgical History:  Procedure Laterality Date  . CARDIAC CATHETERIZATION     05/17/12 / waiting on results  . CARTILAGE SURGERY     RT wrist  . CESAREAN SECTION  1988 & 2008   x 2  . CHOLECYSTECTOMY    . DILITATION & CURRETTAGE/HYSTROSCOPY WITH HYDROTHERMAL ABLATION N/A 01/24/2015   Procedure: DILATATION & CURETTAGE/HYSTEROSCOPY WITH HYDROTHERMAL ABLATION;  Surgeon: Shelly Bombard, MD;  Location: [facility]  ORS;  Service: Gynecology;  Laterality: N/A;  Elberta Fortis will be here  . WISDOM TOOTH EXTRACTION     Family History  Problem Relation Age of Onset  . Hypertension Mother   . Huntington's disease Mother        deceased 2022-07-25  . Diabetes Father   . Hypertension Father   . Heart disease Father   . Scoliosis Sister   . Huntington's disease Sister   . Diabetes Brother   . Hypertension Brother   . Scoliosis Brother   . Skin cancer Maternal Grandmother    Social History   Socioeconomic History  . Marital status:  Single    Spouse name: Not on file  . Number of children: 2  . Years of education: some college  . Highest education level: Not on file  Occupational History  . Occupation: Disabled  Tobacco Use  . Smoking status: Never Smoker  . Smokeless tobacco: Never Used  Substance and Sexual Activity  . Alcohol use: Not Currently    Comment: seldom  . Drug use: No  . Sexual activity: Not Currently    Birth control/protection: Surgical    Comment: Tubal Ligation   Other Topics Concern  . Not on file  Social History Narrative   Patient lives at home with a family member. Patient is single.   Disabled.   Education some college.   Left handed.   Caffeine some times not daily.   Social Determinants of Health   Financial Resource Strain: Low Risk   . Difficulty of Paying Living Expenses: Not hard at all  Food Insecurity: No Food Insecurity  . Worried About Charity fundraiser in the Last Year: Never true  . Ran Out of Food in the Last Year: Never true  Transportation Needs: No Transportation Needs  . Lack of Transportation (Medical): No  . Lack of Transportation (Non-Medical): No  Physical Activity: Inactive  . Days of Exercise per Week: 0 days  . Minutes of Exercise per Session: 0 min  Stress: No Stress Concern Present  . Feeling of Stress : Not at all  Social Connections:   . Frequency of Communication with Friends and Family:   . Frequency of Social Gatherings with Friends and Family:   . Attends Religious Services:   . Active Member of Clubs or Organizations:   . Attends Archivist Meetings:   [name] Marital Status:     Tobacco Counseling Counseling given: Not Answered   Clinical Intake:  Pre-visit preparation completed: Yes  Pain : No/denies pain     Nutritional Status: BMI > 30  Obese Nutritional Risks: None Diabetes: No  How often do you need to have someone help you when you read instructions, pamphlets, or other written materials from your doctor or  pharmacy?: 1 - Never What is the last grade level you completed in school?: some college  Interpreter Needed?: No  Information entered by :: NAllen LPN   Activities of Daily Living In your present state of health, do you have any difficulty performing the following activities: 08/01/2019 05/29/2019  Hearing? N N  Vision? N N  Comment has reading glasses -  Difficulty concentrating or making decisions? Y N  Walking or climbing stairs? Y Y  Comment sometimes -  Dressing or bathing? N N  Doing errands, shopping? N N  Preparing Food and eating ? Y -  Comment daughter helps with most of the cooking -  Using the Toilet? N -  In the  past six months, have you accidently leaked urine? N -  Do you have problems with loss of bowel control? N -  Managing your Medications? N -  Managing your Finances? N -  Housekeeping or managing your Housekeeping? N -  Some recent data might be hidden     Immunizations and Health Maintenance Immunization History  Administered Date(s) Administered  . Influenza Split 03/10/2012  . Influenza,inj,Quad PF,6+ Mos 04/06/2019  . Tdap 03/10/2012   There are no preventive care reminders to display for this patient.  Patient Care Team: [name], FNP as PCP - General ([place]) Rex Kras, Claudette Stapler, RN as Case Manager Daneen Schick as Social Worker  Indicate any recent [place] you may have received from other than [facility] providers in the past year (date may be approximate).     Assessment:   This is a routine wellness examination for Dariel.  Hearing/Vision screen  Hearing Screening   125Hz  250Hz  500Hz  1000Hz  2000Hz  3000Hz  4000Hz  6000Hz  8000Hz   Right ear:           Left ear:           Vision Screening Comments: Regular eye exams.   Dietary issues and exercise activities discussed: Current Exercise Habits: The patient does not participate in regular exercise at present  Goals    . "to continue to feel hopeful and encouraged"  (pt-stated)     [place] (see longtitudinal plan of care for additional care plan information)  Current Barriers:  [name] Knowledge Deficits related to evaluation and treatment of depression . Chronic Disease Management support and education needs related to Huntington's Disease, depression, Fibromyalgia   Nurse Case Manager Clinical Goal(s):  [name] Over the next 90 days, patient will work with the CCM team and PCP to address needs related to disease education and support to improve Self Health management of depression   CCM RN CM Interventions:  07/30/19 call completed with patient  . Evaluation of current treatment plan related to depression and patient's adherence to plan as established by provider. . Reviewed medications with patient and discussed patient is getting good effectiveness from Cymbalta and is feeling more hopeful and encouraged; Instructed patient to notify the CCM RN and or PCP promptly of new or worsening symptoms of depression  . Discussed plans with patient for ongoing care management follow up and provided patient with direct contact information for care management team  Patient Self Care Activities:  . Self administers medications as prescribed . Attends all scheduled provider appointments . Calls pharmacy for medication refills . Calls provider office for new concerns or questions  Initial goal documentation     . "to have less stiffness in muscles and joints" (pt-stated)     CARE PLAN ENTRY (see longtitudinal plan of care for additional care plan information)  Current Barriers:  [name] Knowledge Deficits related to evaluation and treatment of myalgia and decreased ROM . Chronic Disease Management support and education needs related to Huntington's disease; Depression, Fibromyalgia  Nurse Case Manager Clinical Goal(s):  [name] Over the next 90 days, patient will verbalize understanding of plan for evaluation and start of PT/OT for myalgia and decreased ROM  . Over the  next 90 days, patient will work with the PCP to address needs related to disease education and support to improve Self Health management of myalgia and decreased ROM   CCM RN CM Interventions:  07/30/19 call completed with patient  . Evaluation of current treatment plan related to Fibromyalgia and  Huntington's disease and patient's adherence to plan as established by provider. . Provided education to patient re: the [place]; Educated on this foundation and the benefits available; provided patient with the foundation website and encouraged registration and participation; Educated on the benefits of receiving PT/OT to improve ROM, strengthening and stamina . Reviewed medications with patient and discussed patient is adhering to her prescribed regimen; discussed indication, dosage and frequency; pt denies having noted SE  . Collaborated with embedded [place]  regarding patient's request for assistance with navigating the [facility] department as she has attempted to transfer her Medicaid to [place] . Discussed plans with patient for ongoing care management follow up and provided patient with direct contact information for care management team  Patient Self Care Activities:  . Self administers medications as prescribed . Attends all scheduled provider appointments . Calls pharmacy for medication refills . Performs ADL's independently . Performs IADL's independently . Calls provider office for new concerns or questions  Initial goal documentation     . Assist with care coordination needs related to social determinants of health     Current Barriers:  . Huntington's Disease impacting ability to perform iADL's . Limited social support . Lacks knowledge of local community resources to assist with transportation . Limited income resulting in food insecurity  Social Work Clinical Goal(s):  [name] Over the next 45 days the patient will become more  knowledgeable of local food pantry's to assist with food insecurity . Over the next 45 days the patient will work with SW to become more knowledgeable of transportation resources Goal Met 07/17/2019 . Over the next 60 days the patient will work with SW to complete and submit a [employer] application Goal Met 2/77/8242  CCM SW Interventions: Completed 07/17/2019 . Outbound call placed to the patient to assess progression of patient stated goal . Determined the patient has received communication from [employer] regarding application but has yet to open letter o Advised the patient to open the letter to determine approval status . Reviewed health plan transportation benefit with the patient . Encouraged the patient to contact SW with future resource needs . Scheduled outbound call to the patient over the next month to assist with care coordination needs  Patient Self Care Activities:  . Patient verbalizes understanding of plan to work with CM team to address care coordination needs . Self administers medications as prescribed . Attends all scheduled provider appointments . Calls pharmacy for medication refills  Please see past updates related to this goal by clicking on the "Past Updates" button in the selected goal      . Patient Stated     08/01/2019, wants to get healthier      Depression Screen PHQ 2/9 Scores 08/01/2019 07/12/2019 07/05/2019 06/21/2019 05/29/2019 10/29/2016  PHQ - 2 Score 0 0 1 0 0 4  PHQ- 9 Score 7 - - 0 - 13  Some encounter information is confidential and restricted. Go to Review Flowsheets activity to see all data.    Fall Risk Fall Risk  08/01/2019 07/12/2019 07/05/2019 06/21/2019 05/29/2019  Falls in the past year? 1 0 1 0 1  Comment loses balance - - - -  Number falls in past yr: 1 - 0 - 0  Injury with Fall? 1 - 1 - 1  Comment leg pain and swelling - Hip Pain - -  Risk for fall due to : Impaired balance/gait;Medication side effect - - - -  Follow up  Falls evaluation  completed;Education provided;Falls prevention discussed - - - -    Is the patient's home free of loose throw rugs in walkways, pet beds, electrical cords, etc?   yes      Grab bars in the bathroom? no      Handrails on the stairs?   yes      Adequate lighting?   yes  Timed Get Up and Go Performed n/a  Cognitive Function:     6CIT Screen 08/01/2019  What Year? 0 points  What month? 0 points  What time? 0 points  Count back from 20 0 points  Months in reverse 0 points  Repeat phrase 2 points  Total Score 2    Screening Tests Health Maintenance  Topic Date Due  . PAP SMEAR-Modifier  10/30/2019  . MAMMOGRAM  04/07/2020  . TETANUS/TDAP  03/10/2022  . COLONOSCOPY  06/06/2022  . INFLUENZA VACCINE  Completed  . HIV Screening  Completed    Qualifies for Shingles Vaccine? yes  Cancer Screenings: Lung: Low Dose CT Chest recommended if Age 32-80 years, 30 pack-year currently smoking OR have quit w/in 15years. Patient does not qualify. Breast: Up to date on Mammogram? Yes   Up to date of Bone Density/Dexa? n/a Colorectal: up to date  Additional Screenings:  Hepatitis C Screening: n/a     Plan:    Patient wants to get healthier.  I have personally reviewed and noted the following in the patient's chart:   . Medical and social history . Use of alcohol, tobacco or illicit drugs  . Current medications and supplements . Functional ability and status . Nutritional status . Physical activity . Advanced directives . List of other physicians . Hospitalizations, surgeries, and ER visits in previous 12 months . Vitals . Screenings to include cognitive, depression, and falls . Referrals and appointments  In addition, I have reviewed and discussed with patient certain preventive protocols, quality metrics, and best practice recommendations. A written personalized care plan for preventive services as well as general preventive health recommendations were provided to patient.      Kellie Simmering, LPN   08/16/8117

## 2019-08-01 NOTE — Progress Notes (Signed)
This visit occurred during the SARS-CoV-2 public health emergency.  Safety protocols were in place, including screening questions prior to the visit, additional usage of staff PPE, and extensive cleaning of exam room while observing appropriate contact time as indicated for disinfecting solutions.  Subjective:     Patient ID: Katherine Gregory , female    DOB: 13-Nov-1965 , 54 y.o.   MRN: [id number]   Chief Complaint  Patient presents with  . Annual Exam    w/pap    HPI  Here for HM   Reports having a rash to right hand after having vitamin B12 x 2. She also felt an increase in fatigue.  She has not had covid vaccine.    She continues to follow up with Neurology in August.    The patient states she uses ablation for birth control. Mammogram - is due now she needs to call them back.  Negative for: breast discharge, breast lump(s), breast pain and breast self exam.  Pertinent negatives include abnormal bleeding (hematology), anxiety, decreased libido, depression, difficulty falling sleep, dyspareunia, history of infertility, nocturia, sexual dysfunction, sleep disturbances, urinary incontinence, urinary urgency, vaginal discharge and vaginal itching. Diet regular - "trying to eat healthier", drinking lemon water, decrease cholesterol intake. The patient states her exercise level is minimal around the house.    The patient's tobacco use is:  Social History   Tobacco Use  Smoking Status Never Smoker  Smokeless Tobacco Never Used   She has been exposed to passive smoke. The patient's alcohol use is:  Social History   Substance and Sexual Activity  Alcohol Use Yes  . Alcohol/week: 1.0 standard drinks  . Types: 1 Glasses of wine per week   Comment: occassional   Additional information: Last pap unknown, due today Past Medical History:  Diagnosis Date  . Acid reflux   . Bleeding gastric ulcer   . Cellulitis   . Cervical back pain with evidence of disc disease   .  Depression   . Dysuria   . FATIGUE   . Fibromyalgia   . FLANK PAIN, RIGHT   . GOITER   . HEADACHES, HX OF   . Huntington's disease ([facility])   . IBS (irritable bowel syndrome)   . Palpitations   . Post partum depression   . Seasonal allergies   . URTICARIA   . Vertigo   . Vitamin D deficiency      Family History  Problem Relation Age of Onset  . Hypertension Mother   . Huntington's disease Mother        deceased 2022-08-19  . Diabetes Father   . Hypertension Father   . Heart disease Father   . Scoliosis Sister   . Huntington's disease Sister   . Diabetes Brother   . Hypertension Brother   . Scoliosis Brother   . Skin cancer Maternal Grandmother      Current Outpatient Medications:  .  B Complex Vitamins (VITAMIN B COMPLEX PO), Take by mouth. Take 1 tablet daily, Disp: , Rfl:  .  Calcium Carb-Cholecalciferol (CALCIUM 1000 + D) 1000-800 MG-UNIT TABS, Take by mouth. Take 1 tablet daily, Disp: , Rfl:  .  Cholecalciferol (VITAMIN D3) 50 MCG (2000 UT) TABS, Take by mouth. Take 1 tablet daily, Disp: , Rfl:  .  DULoxetine (CYMBALTA) 30 MG capsule, Take 2 tablets by mouth daily, Disp: 60 capsule, Rfl: 2 .  Multiple Vitamin (MULTIVITAMIN ADULT PO), Take by mouth. Take 1 tablet daily, Disp: , Rfl:  .  Multiple Vitamins-Minerals (AIRBORNE PO), Take 4 tablets by mouth daily., Disp: , Rfl:  .  Multiple Vitamins-Minerals (ZINC PO), Take 60 mg by mouth daily., Disp: , Rfl:  .  omeprazole (PRILOSEC) 40 MG capsule, Take 1 capsule (40 mg total) by mouth daily., Disp: 90 capsule, Rfl: 1 .  tetrabenazine (XENAZINE) 12.5 MG tablet, Take 1 tablet (12.5 mg total) by mouth 2 (two) times daily., Disp: 60 tablet, Rfl: 11 .  Wheat Dextrin (BENEFIBER PO), Take 1 tablet by mouth daily., Disp: , Rfl:    Allergies  Allergen Reactions  . Ibuprofen Other (See Comments)    "It intensifies my pain." Made pain and symptoms worse. "It intensifies my pain."  . Morphine Itching    Red blotches on skin and  burning.  . Morphine And Related Itching and Other (See Comments)    Reaction burns Reaction burns  . Penicillin G Itching  . Penicillins Hives and Itching    Has patient had a PCN reaction causing immediate rash, facial/tongue/throat swelling, SOB or lightheadedness with hypotension: Yes Has patient had a PCN reaction causing severe rash involving mucus membranes or skin necrosis: No Has patient had a PCN reaction that required hospitalization No Has patient had a PCN reaction occurring within the last 10 years: No If all of the above answers are "NO", then may proceed with Cephalosporin use.   [name] Risperdal [Risperidone] Other (See Comments)    Confusion, oversedation   . Adhesive [Tape] Itching    Itching and burning at site Itching and burning at site  . Pseudoephedrine Other (See Comments) and Palpitations    Speed up heart rate Speed up heart rate  . Vitamin [Cyanocobalamin] Hives and Rash     Review of Systems  Constitutional: Negative.   HENT: Negative.   Eyes: Negative.   Respiratory: Negative.   Cardiovascular: Negative.  Negative for chest pain, palpitations and leg swelling.  Gastrointestinal: Negative.   Endocrine: Negative.   Genitourinary: Negative.   Musculoskeletal: Negative.   Skin: Negative.   Allergic/Immunologic: Negative.   Neurological: Negative.   Hematological: Negative.   Psychiatric/Behavioral: Negative.      Today's Vitals   08/01/19 1115  BP: 120/82  Pulse: (!) 102  Temp: 98.6 F (37 C)  TempSrc: Oral  SpO2: 96%  Weight: 201 lb 3.2 oz (91.3 kg)  Height: 5' 7.4" (1.712 m)   Body mass index is 31.14 kg/m.   Objective:  Physical Exam Vitals reviewed.  Constitutional:      Appearance: Normal appearance. She is well-developed.  HENT:     Head: Normocephalic and atraumatic.     Right Ear: Hearing, tympanic membrane, ear canal and external ear normal. There is no impacted cerumen.     Left Ear: Hearing, tympanic membrane, ear canal and  external ear normal. There is no impacted cerumen.     Nose: Nose normal. No congestion.     Mouth/Throat:     Mouth: Mucous membranes are moist.  Eyes:     General: Lids are normal.     Conjunctiva/sclera: Conjunctivae normal.     Pupils: Pupils are equal, round, and reactive to light.     Funduscopic exam:    Right eye: No papilledema.        Left eye: No papilledema.  Neck:     Thyroid: No thyroid mass.     Vascular: No carotid bruit.  Cardiovascular:     Rate and Rhythm: Normal rate and regular rhythm.     Pulses:  Normal pulses.     Heart sounds: Normal heart sounds. No murmur.  Pulmonary:     Effort: Pulmonary effort is normal. No respiratory distress.     Breath sounds: Normal breath sounds.  Abdominal:     General: Abdomen is flat. Bowel sounds are normal. There is no distension.     Palpations: Abdomen is soft.  Genitourinary:    General: Normal vulva.     Vagina: Normal. No vaginal discharge.     Cervix: Normal.     Uterus: Normal.      Adnexa: Right adnexa normal and left adnexa normal.  Musculoskeletal:        General: No swelling. Normal range of motion.     Cervical back: Full passive range of motion without pain, normal range of motion and neck supple.     Right lower leg: No edema.     Left lower leg: No edema.  Skin:    General: Skin is warm and dry.     Capillary Refill: Capillary refill takes less than 2 seconds.  Neurological:     General: No focal deficit present.     Mental Status: She is alert and oriented to person, place, and time.     Cranial Nerves: No cranial nerve deficit.     Sensory: No sensory deficit.  Psychiatric:        Mood and Affect: Mood normal.        Behavior: Behavior normal.        Thought Content: Thought content normal.        Judgment: Judgment normal.         Assessment And Plan:   1. Encounter for general adult medical examination w/o abnormal findings . Behavior modifications discussed and diet history reviewed.    . Pt will continue to exercise regularly and modify diet with low GI, plant based foods and decrease intake of processed foods.  . Recommend intake of daily multivitamin, Vitamin D, and calcium.  . Recommend mammogram and colonoscopy for preventive screenings, as well as recommend immunizations that include influenza, TDAP - POCT Urinalysis Dipstick (81002)  2. Vitamin B12 deficiency  She is not taking the vitamin b12 injection supplements  Encouraged to take over the counter supplements  3. Huntington's disease ([place])  Chronic  She is being followed by the Neurologist  4. Encounter for Papanicolaou smear of cervix  No abnormal findings on physical exam - Cytology -Pap Smear  5. Screen for STD (sexually transmitted disease)  - Cervicovaginal ancillary only   [name], FNP    THE PATIENT IS ENCOURAGED TO PRACTICE SOCIAL DISTANCING DUE TO THE COVID-19 PANDEMIC.

## 2019-08-01 NOTE — Chronic Care Management (AMB) (Signed)
  Chronic Care Management   Outreach Note  08/01/2019 Name: Katherine Gregory MRN: [id number] DOB: 12-14-1965  Referred by: Arnette Felts, FNP Reason for referral : Care Coordination   SW placed an unsuccessful outbound call to the patient in response to communication received by RN Case Manager requesting assistance with Medicaid coverage. SW left a HIPAA compliant voice message requesting a return call.  Follow Up Plan: SW will follow up with the patient over the next 2 weeks.  Bevelyn Ngo, BSW, CDP Social Worker, Certified Dementia Practitioner [facility] Care Management [phone]

## 2019-08-01 NOTE — Patient Instructions (Signed)
Health Maintenance, Female Adopting a healthy lifestyle and getting preventive care are important in promoting health and wellness. Ask your health care provider about:  The right schedule for you to have regular tests and exams.  Things you can do on your own to prevent diseases and keep yourself healthy. What should I know about diet, weight, and exercise? Eat a healthy diet   Eat a diet that includes plenty of vegetables, fruits, low-fat dairy products, and lean protein.  Do not eat a lot of foods that are high in solid fats, added sugars, or sodium. Maintain a healthy weight Body mass index (BMI) is used to identify weight problems. It estimates body fat based on height and weight. Your health care provider can help determine your BMI and help you achieve or maintain a healthy weight. Get regular exercise Get regular exercise. This is one of the most important things you can do for your health. Most adults should:  Exercise for at least 150 minutes each week. The exercise should increase your heart rate and make you sweat (moderate-intensity exercise).  Do strengthening exercises at least twice a week. This is in addition to the moderate-intensity exercise.  Spend less time sitting. Even light physical activity can be beneficial. Watch cholesterol and blood lipids Have your blood tested for lipids and cholesterol at 53 years of age, then have this test every 5 years. Have your cholesterol levels checked more often if:  Your lipid or cholesterol levels are high.  You are older than 53 years of age.  You are at high risk for heart disease. What should I know about cancer screening? Depending on your health history and family history, you may need to have cancer screening at various ages. This may include screening for:  Breast cancer.  Cervical cancer.  Colorectal cancer.  Skin cancer.  Lung cancer. What should I know about heart disease, diabetes, and high blood  pressure? Blood pressure and heart disease  High blood pressure causes heart disease and increases the risk of stroke. This is more likely to develop in people who have high blood pressure readings, are of African descent, or are overweight.  Have your blood pressure checked: ? Every 3-5 years if you are 18-39 years of age. ? Every year if you are 40 years old or older. Diabetes Have regular diabetes screenings. This checks your fasting blood sugar level. Have the screening done:  Once every three years after age 40 if you are at a normal weight and have a low risk for diabetes.  More often and at a younger age if you are overweight or have a high risk for diabetes. What should I know about preventing infection? Hepatitis B If you have a higher risk for hepatitis B, you should be screened for this virus. Talk with your health care provider to find out if you are at risk for hepatitis B infection. Hepatitis C Testing is recommended for:  Everyone born from 1945 through 1965.  Anyone with known risk factors for hepatitis C. Sexually transmitted infections (STIs)  Get screened for STIs, including gonorrhea and chlamydia, if: ? You are sexually active and are younger than 53 years of age. ? You are older than 53 years of age and your health care provider tells you that you are at risk for this type of infection. ? Your sexual activity has changed since you were last screened, and you are at increased risk for chlamydia or gonorrhea. Ask your health care provider if   you are at risk.  Ask your health care provider about whether you are at high risk for HIV. Your health care provider may recommend a prescription medicine to help prevent HIV infection. If you choose to take medicine to prevent HIV, you should first get tested for HIV. You should then be tested every 3 months for as long as you are taking the medicine. Pregnancy  If you are about to stop having your period (premenopausal) and  you may become pregnant, seek counseling before you get pregnant.  Take 400 to 800 micrograms (mcg) of folic acid every day if you become pregnant.  Ask for birth control (contraception) if you want to prevent pregnancy. Osteoporosis and menopause Osteoporosis is a disease in which the bones lose minerals and strength with aging. This can result in bone fractures. If you are 65 years old or older, or if you are at risk for osteoporosis and fractures, ask your health care provider if you should:  Be screened for bone loss.  Take a calcium or vitamin D supplement to lower your risk of fractures.  Be given hormone replacement therapy (HRT) to treat symptoms of menopause. Follow these instructions at home: Lifestyle  Do not use any products that contain nicotine or tobacco, such as cigarettes, e-cigarettes, and chewing tobacco. If you need help quitting, ask your health care provider.  Do not use street drugs.  Do not share needles.  Ask your health care provider for help if you need support or information about quitting drugs. Alcohol use  Do not drink alcohol if: ? Your health care provider tells you not to drink. ? You are pregnant, may be pregnant, or are planning to become pregnant.  If you drink alcohol: ? Limit how much you use to 0-1 drink a day. ? Limit intake if you are breastfeeding.  Be aware of how much alcohol is in your drink. In the [place] one drink equals one 12 oz bottle of beer (355 mL), one 5 oz glass of wine (148 mL), or one 1 oz glass of hard liquor (44 mL). General instructions  Schedule regular health, dental, and eye exams.  Stay current with your vaccines.  Tell your health care provider if: ? You often feel depressed. ? You have ever been abused or do not feel safe at home. Summary  Adopting a healthy lifestyle and getting preventive care are important in promoting health and wellness.  Follow your health care provider's instructions about healthy  diet, exercising, and getting tested or screened for diseases.  Follow your health care provider's instructions on monitoring your cholesterol and blood pressure. This information is not intended to replace advice given to you by your health care provider. Make sure you discuss any questions you have with your health care provider. Document Revised: 04/19/2018 Document Reviewed: 04/19/2018 Elsevier Patient Education  2020 Elsevier Inc.  

## 2019-08-03 LAB — CERVICOVAGINAL ANCILLARY ONLY
Bacterial Vaginitis (gardnerella): NEGATIVE
Candida Glabrata: NEGATIVE
Candida Vaginitis: NEGATIVE
Chlamydia: NEGATIVE
Comment: NEGATIVE
Comment: NEGATIVE
Comment: NEGATIVE
Comment: NEGATIVE
Comment: NEGATIVE
Comment: NORMAL
Neisseria Gonorrhea: NEGATIVE
Trichomonas: NEGATIVE

## 2019-08-04 ENCOUNTER — Telehealth: Payer: Self-pay

## 2019-08-04 NOTE — Telephone Encounter (Signed)
Left vm for pt to return call  

## 2019-08-04 NOTE — Telephone Encounter (Signed)
-----   Message from Janece Moore, FNP sent at 08/03/2019 12:24 PM EDT ----- Negative for any STDs, we are waiting for your PAP to return 

## 2019-08-06 ENCOUNTER — Telehealth: Payer: Self-pay

## 2019-08-06 LAB — CYTOLOGY - PAP: Diagnosis: NEGATIVE

## 2019-08-06 NOTE — Telephone Encounter (Signed)
-----   Message from Arnette Felts, FNP sent at 08/03/2019 12:24 PM EDT ----- Negative for any STDs, we are waiting for your PAP to return

## 2019-08-06 NOTE — Telephone Encounter (Signed)
Left vm for pt to return call for lab results  

## 2019-08-07 ENCOUNTER — Ambulatory Visit: Payer: Medicare HMO | Admitting: Psychology

## 2019-08-07 ENCOUNTER — Ambulatory Visit: Payer: Self-pay

## 2019-08-07 DIAGNOSIS — F331 Major depressive disorder, recurrent, moderate: Secondary | ICD-10-CM | POA: Diagnosis not present

## 2019-08-07 DIAGNOSIS — F339 Major depressive disorder, recurrent, unspecified: Secondary | ICD-10-CM

## 2019-08-07 DIAGNOSIS — G10 Huntington's disease: Secondary | ICD-10-CM

## 2019-08-08 NOTE — Chronic Care Management (AMB) (Signed)
Chronic Care Management    Social Work Follow Up Note  08/07/2019 Name: Katherine Gregory MRN: [id number] DOB: [birth year]  Katherine Gregory is a 54 y.o. year old female who is a primary care patient of [name], [place]. The CCM team was consulted for assistance with care coordination.   Review of patient status, including review of consultants reports, other relevant assessments, and collaboration with appropriate care team members and the patient's provider was performed as part of comprehensive patient evaluation and provision of chronic care management services.    SDOH (Social Determinants of Health) assessments performed: No    Outpatient Encounter Medications as of 08/07/2019  Medication Sig  . B Complex Vitamins (VITAMIN B COMPLEX PO) Take by mouth. Take 1 tablet daily  . Calcium Carb-Cholecalciferol (CALCIUM 1000 + D) 1000-800 MG-UNIT TABS Take by mouth. Take 1 tablet daily  . Cholecalciferol (VITAMIN D3) 50 MCG (2000 UT) TABS Take by mouth. Take 1 tablet daily  . DULoxetine (CYMBALTA) 30 MG capsule Take 2 tablets by mouth daily  . Multiple Vitamin (MULTIVITAMIN ADULT PO) Take by mouth. Take 1 tablet daily  . Multiple Vitamins-Minerals (AIRBORNE PO) Take 4 tablets by mouth daily.  . Multiple Vitamins-Minerals (ZINC PO) Take 60 mg by mouth daily.  [name] omeprazole (PRILOSEC) 40 MG capsule Take 1 capsule (40 mg total) by mouth daily.  [name] tetrabenazine (XENAZINE) 12.5 MG tablet Take 1 tablet (12.5 mg total) by mouth 2 (two) times daily.  . Wheat Dextrin (BENEFIBER PO) Take 1 tablet by mouth daily.   No facility-administered encounter medications on file as of 08/07/2019.     Goals Addressed            This Visit's Progress   . Assist with care coordination needs related to social determinants of health       Current Barriers:  . Huntington's Disease impacting ability to perform iADL's . Limited social support . Lacks knowledge of local community resources to assist  with transportation . Limited income resulting in food insecurity  Social Work Clinical Goal(s):  [name] Over the next 45 days the patient will become more knowledgeable of local food pantry's to assist with food insecurity . Over the next 45 days the patient will work with SW to become more knowledgeable of transportation resources Goal Met 07/17/2019 . Over the next 60 days the patient will work with SW to complete and submit a SCAT application Goal Met 0/67/7034 . New 08/07/19- Over the next 60 days the patient will work with SW to address concerns related to transferring Medicaid benefit to state of [place] in order to obtain access to a more suited health plan  CCM SW Interventions: Completed 08/07/2019 . Outbound call placed to the patient in response to communication received from RN Case Manager indicating the patient needed assistance navigating health plan benefits . Determined the patient is having difficulty transitioning Medicaid from [place] to [place] o The patient reports she moved to [facility] in July of 2020 and informed [place] to stop her benefits she was receiving o The patient has since tried to apply for benefits in [place] and been denied due to [place] benefits continuing o "I have called [place] so many times and they keep telling me the benefit is stopped but it isn't and I don't know what to do" - The patient reports frustration over not being able to get health plan benefits within her current state o Informed the patient has spoken with her Medicare plan and was  informed the Medicaid benefit is set to stop April 1 but she is unsure if that will actually happen . Advised the patient SW would outreach colleagues to discuss next steps  . Scheduled follow up call over the next week   Patient Self Care Activities:  . Patient verbalizes understanding of plan to work with CM team to address care coordination needs . Self administers medications as prescribed . Attends all scheduled provider  appointments . Calls pharmacy for medication refills  Please see past updates related to this goal by clicking on the "Past Updates" button in the selected goal          Follow Up Plan: SW will follow up with patient by phone over the next week.  Daneen Schick, BSW, CDP Social Worker, Certified Dementia Practitioner [place] [facility] [place] [facility] [phone]  Total time spent performing care coordination and/or care management activities with the patient by phone or face to face = 15 minutes.

## 2019-08-08 NOTE — Patient Instructions (Signed)
Social Worker Visit Information  Goals we discussed today:  Goals Addressed            This Visit's Progress   . Assist with care coordination needs related to social determinants of health       Current Barriers:  . Huntington's Disease impacting ability to perform iADL's . Limited social support . Lacks knowledge of local community resources to assist with transportation . Limited income resulting in food insecurity  Social Work Clinical Goal(s):  [name] Over the next 45 days the patient will become more knowledgeable of local food pantry's to assist with food insecurity . Over the next 45 days the patient will work with SW to become more knowledgeable of transportation resources Goal Met 07/17/2019 . Over the next 60 days the patient will work with SW to complete and submit a SCAT application Goal Met 3/83/3383 . New 08/07/19- Over the next 60 days the patient will work with SW to address concerns related to transferring Medicaid benefit to state of [place] in order to obtain access to a more suited health plan  CCM SW Interventions: Completed 08/07/2019 . Outbound call placed to the patient in response to communication received from RN Case Manager indicating the patient needed assistance navigating health plan benefits . Determined the patient is having difficulty transitioning Medicaid from [place] to [place] o The patient reports she moved to [facility] in July of 2020 and informed [place] to stop her benefits she was receiving o The patient has since tried to apply for benefits in [place] and been denied due to [place] benefits continuing o "I have called [place] so many times and they keep telling me the benefit is stopped but it isn't and I don't know what to do" - The patient reports frustration over not being able to get health plan benefits within her current state o Informed the patient has spoken with her Medicare plan and was informed the Medicaid benefit is set to stop April 1 but she is unsure if  that will actually happen . Advised the patient SW would outreach colleagues to discuss next steps  . Scheduled follow up call over the next week   Patient Self Care Activities:  . Patient verbalizes understanding of plan to work with CM team to address care coordination needs . Self administers medications as prescribed . Attends all scheduled provider appointments . Calls pharmacy for medication refills  Please see past updates related to this goal by clicking on the "Past Updates" button in the selected goal          Follow Up Plan: SW will follow up with patient by phone over the next week.  Daneen Schick, BSW, CDP Social Worker, Certified Dementia Practitioner [place] [facility] [place] [facility] [phone]

## 2019-08-11 ENCOUNTER — Encounter (HOSPITAL_COMMUNITY): Payer: Self-pay

## 2019-08-11 ENCOUNTER — Other Ambulatory Visit: Payer: Self-pay

## 2019-08-11 ENCOUNTER — Emergency Department (HOSPITAL_COMMUNITY): Payer: Medicare HMO

## 2019-08-11 ENCOUNTER — Emergency Department (HOSPITAL_COMMUNITY)
Admission: EM | Admit: 2019-08-11 | Discharge: 2019-08-11 | Disposition: A | Discharge status: Home / Self Care | Payer: Medicare HMO | Attending: Emergency Medicine | Admitting: Emergency Medicine

## 2019-08-11 ENCOUNTER — Ambulatory Visit (HOSPITAL_COMMUNITY)
Admission: EM | Admit: 2019-08-11 | Discharge: 2019-08-11 | Disposition: A | Discharge status: Home / Self Care | Payer: Medicare HMO | Source: Home / Self Care

## 2019-08-11 DIAGNOSIS — I10 Essential (primary) hypertension: Secondary | ICD-10-CM | POA: Diagnosis not present

## 2019-08-11 DIAGNOSIS — Z79899 Other long term (current) drug therapy: Secondary | ICD-10-CM | POA: Insufficient documentation

## 2019-08-11 DIAGNOSIS — S0990XA Unspecified injury of head, initial encounter: Secondary | ICD-10-CM | POA: Diagnosis present

## 2019-08-11 DIAGNOSIS — W208XXA Other cause of strike by thrown, projected or falling object, initial encounter: Secondary | ICD-10-CM | POA: Insufficient documentation

## 2019-08-11 DIAGNOSIS — Y92003 Bedroom of unspecified non-institutional (private) residence as the place of occurrence of the external cause: Secondary | ICD-10-CM | POA: Diagnosis not present

## 2019-08-11 DIAGNOSIS — S060X0A Concussion without loss of consciousness, initial encounter: Secondary | ICD-10-CM | POA: Insufficient documentation

## 2019-08-11 DIAGNOSIS — R519 Headache, unspecified: Secondary | ICD-10-CM | POA: Diagnosis not present

## 2019-08-11 DIAGNOSIS — Y999 Unspecified external cause status: Secondary | ICD-10-CM | POA: Diagnosis not present

## 2019-08-11 DIAGNOSIS — Z9049 Acquired absence of other specified parts of digestive tract: Secondary | ICD-10-CM | POA: Diagnosis not present

## 2019-08-11 DIAGNOSIS — Y939 Activity, unspecified: Secondary | ICD-10-CM | POA: Diagnosis not present

## 2019-08-11 MED ORDER — MECLIZINE HCL 25 MG PO TABS: 25.0000 mg | ORAL_TABLET | Freq: Three times a day (TID) | ORAL | 0 refills | Status: DC | PRN

## 2019-08-11 NOTE — ED Provider Notes (Signed)
[facility] EMERGENCY DEPARTMENT Provider Note   CSN: [id number] Arrival date & time: 08/11/19  1534     History Chief Complaint  Patient presents with   Head Injury    Katherine Gregory is a 54 y.o. female.  The history is provided by the patient and medical records. No language interpreter was used.  Head Injury  Katherine Gregory is a 54 y.o. female who presents to the Emergency Department complaining of headache. She presents the emergency department upon referral from urgent care for evaluation of headache. She states that she was in her bedroom when her granddaughter pulled a drawer out of the headstand that fell onto her head a few days ago. Since that time she has experienced a headache that is migratory throughout her head. The headache waxes and wanes. She has occasional associated dizziness. She also has occasional nausea. She denies any fevers, vision changes, numbness, weakness. She does not take any blood thinners. She does have a history of reflux as well as Huntington's disease. She does report feeling more fatigued recently.    Past Medical History:  Diagnosis Date   Acid reflux    Bleeding gastric ulcer    Cellulitis    Cervical back pain with evidence of disc disease    Depression    Dysuria    FATIGUE    Fibromyalgia    FLANK PAIN, RIGHT    GOITER    HEADACHES, HX OF    Huntington's disease (HCC)    IBS (irritable bowel syndrome)    Palpitations    Post partum depression    Seasonal allergies    URTICARIA    Vertigo    Vitamin D deficiency     Patient Active Problem List   Diagnosis Date Noted   Gait abnormality 06/19/2019   Polypharmacy 06/17/2016   BMI 30.0-30.9,adult 05/14/2016   Benign paroxysmal positional vertigo 04/28/2016   Insomnia 04/28/2016   Depression, major, recurrent (HCC) 04/28/2016   Vitamin D deficiency 04/28/2016   Polyp at cervical os 01/24/2015   Huntington's  disease (HCC) 06/20/2014   Abnormal uterine bleeding 11/16/2013   Bleeding gastric ulcer    Cervical back pain with evidence of disc disease    Chest pain 05/16/2012   Dizziness 05/16/2012   Abdominal pain 05/16/2012   Atypical chest pain 04/21/2012   Hot flashes 03/10/2012   Fibromyalgia 03/10/2012   Overweight(278.02) 01/24/2012   Borderline systolic HTN 11/25/2011   FLANK PAIN, RIGHT 10/26/2007   Fatigue 10/19/2007   DYSURIA 10/19/2007   ALLERGIC RHINITIS 08/17/2007   GOITER 07/25/2007   GERD 07/25/2007   PALPITATIONS 07/25/2007   HEADACHES, HX OF 07/25/2007    Past Surgical History:  Procedure Laterality Date   CARDIAC CATHETERIZATION     05/17/12 / waiting on results   CARTILAGE SURGERY     RT wrist   CESAREAN SECTION  1988 & 2008   x 2   CHOLECYSTECTOMY     DILITATION & CURRETTAGE/HYSTROSCOPY WITH HYDROTHERMAL ABLATION N/A 01/24/2015   Procedure: DILATATION & CURETTAGE/HYSTEROSCOPY WITH HYDROTHERMAL ABLATION;  Surgeon: Brock Bad, MD;  Location: [facility];  Service: Gynecology;  Laterality: N/A;  Ethelene Browns will be here   WISDOM TOOTH EXTRACTION       OB History    Gravida  4   Para  2   Term  2   Preterm      AB  2   Living  2     SAB  1  TAB  1   Ectopic      Multiple      Live Births  2           Family History  Problem Relation Age of Onset   Hypertension Mother    Huntington's disease Mother        deceased Aug 19, 2022   Diabetes Father    Hypertension Father    Heart disease Father    Scoliosis Sister    Huntington's disease Sister    Diabetes Brother    Hypertension Brother    Scoliosis Brother    Skin cancer Maternal Grandmother     Social History   Tobacco Use   Smoking status: Never Smoker   Smokeless tobacco: Never Used  Substance Use Topics   Alcohol use: Not Currently    Comment: seldom   Drug use: No    Home Medications Prior to Admission medications   Medication Sig  Start Date End Date Taking? Authorizing Provider  B Complex Vitamins (VITAMIN B COMPLEX PO) Take by mouth. Take 1 tablet daily    [provider]  Calcium Carb-Cholecalciferol (CALCIUM 1000 + D) 1000-800 MG-UNIT TABS Take by mouth. Take 1 tablet daily    [provider]  Cholecalciferol (VITAMIN D3) 50 MCG (2000 UT) TABS Take by mouth. Take 1 tablet daily    [provider]  DULoxetine (CYMBALTA) 30 MG capsule Take 2 tablets by mouth daily 06/25/19   Arnette Felts, FNP  meclizine (ANTIVERT) 25 MG tablet Take 1 tablet (25 mg total) by mouth 3 (three) times daily as needed for dizziness. 08/11/19   Tilden Fossa, MD  Multiple Vitamin (MULTIVITAMIN ADULT PO) Take by mouth. Take 1 tablet daily    [provider]  Multiple Vitamins-Minerals (AIRBORNE PO) Take 4 tablets by mouth daily.    [provider]  Multiple Vitamins-Minerals (ZINC PO) Take 60 mg by mouth daily.    [provider]  omeprazole (PRILOSEC) 40 MG capsule Take 1 capsule (40 mg total) by mouth daily. 06/25/19   Arnette Felts, FNP  tetrabenazine Guinevere Scarlet) 12.5 MG tablet Take 1 tablet (12.5 mg total) by mouth 2 (two) times daily. 06/19/19   Levert Feinstein, MD  Wheat Dextrin (BENEFIBER PO) Take 1 tablet by mouth daily.    [provider]    Allergies    Ibuprofen, Morphine, Morphine and related, Penicillin g, Penicillins, Risperdal [risperidone], Adhesive [tape], Pseudoephedrine, and Vitamin [cyanocobalamin]  Review of Systems   Review of Systems  All other systems reviewed and are negative.   Physical Exam Updated Vital Signs BP (!) 137/98 (BP Location: Right Arm)    Pulse 86    Temp 98.7 F (37.1 C) (Oral)    Resp 16    Ht 5\' 10"  (1.778 m)    Wt 90.7 kg    SpO2 99%    BMI 28.70 kg/m   Physical Exam Vitals and nursing note reviewed.  Constitutional:      Appearance: She is well-developed.  HENT:     Head: Normocephalic and atraumatic.  Eyes:     Extraocular Movements:  Extraocular movements intact.     Pupils: Pupils are equal, round, and reactive to light.  Cardiovascular:     Rate and Rhythm: Normal rate and regular rhythm.     Heart sounds: No murmur.  Pulmonary:     Effort: Pulmonary effort is normal. No respiratory distress.     Breath sounds: Normal breath sounds.  Abdominal:  Palpations: Abdomen is soft.     Tenderness: There is no abdominal tenderness. There is no guarding or rebound.  Musculoskeletal:        General: No swelling or tenderness.  Skin:    General: Skin is warm and dry.  Neurological:     Mental Status: She is alert and oriented to person, place, and time.     Comments: No asymmetry of facial movements. Visual fields are grossly intact. Five out of five strength in all four extremities with sensation to light touch intact in all four extremities  Psychiatric:        Behavior: Behavior normal.     ED Results / Procedures / Treatments   Labs (all labs ordered are listed, but only abnormal results are displayed) Labs Reviewed - No data to display  EKG None  Radiology CT Head Wo Contrast  Result Date: 08/11/2019 CLINICAL DATA:  Head pain after head injury few days ago. Persistent headache and dizziness. Dresser door fell on left side of head. EXAM: CT HEAD WITHOUT CONTRAST TECHNIQUE: Contiguous axial images were obtained from the base of the skull through the vertex without intravenous contrast. COMPARISON:  Head CT 09/26/2009 FINDINGS: Brain: No intracranial hemorrhage, mass effect, or midline shift. No hydrocephalus. The basilar cisterns are patent. No evidence of territorial infarct or acute ischemia. No extra-axial or intracranial fluid collection. Vascular: No hyperdense vessel or unexpected calcification. Skull: No fracture or focal lesion. Sinuses/Orbits: Paranasal sinuses and mastoid air cells are clear. The visualized orbits are unremarkable. Other: None. IMPRESSION: Negative head CT. Electronically Signed   By:  Keith Rake M.D.   On: 08/11/2019 16:44    Procedures Procedures (including critical care time)  Medications Ordered in ED Medications - No data to display  ED Course  I have reviewed the triage vital signs and the nursing notes.  Pertinent labs & imaging results that were available during my care of the patient were reviewed by me and considered in my medical decision making (see chart for details).    MDM Rules/Calculators/A&P                     Patient here for evaluation of persistent headaches following head injury a few days ago. She is non-toxic appearing on evaluation and neurologically intact. Offered symptom management for her headache, dizziness patient declines at this time. Plan to obtain CT head to rule out intracranial abnormality.   CT negative for acute abnormality. Discussed with patient home care for concussion, outpatient follow-up and return precautions. Final Clinical Impression(s) / ED Diagnoses Final diagnoses:  Concussion without loss of consciousness, initial encounter    Rx / DC Orders ED Discharge Orders         Ordered    meclizine (ANTIVERT) 25 MG tablet  3 times daily PRN     08/11/19 1716           Quintella Reichert, MD 08/11/19 2320

## 2019-08-11 NOTE — Discharge Instructions (Addendum)
You may have a concussion. I have attached written information about this to your discharge paperwork.   Continue to use Excedrin as needed for headaches  If you are not improving or having worsening symptoms, follow up in the ER for a CT of your head to rule out any bleeding.
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
## 2019-08-16 ENCOUNTER — Ambulatory Visit: Payer: Self-pay

## 2019-08-16 ENCOUNTER — Telehealth: Payer: Self-pay | Admitting: Neurology

## 2019-08-16 ENCOUNTER — Other Ambulatory Visit: Payer: Self-pay

## 2019-08-16 DIAGNOSIS — G10 Huntington's disease: Secondary | ICD-10-CM

## 2019-08-16 DIAGNOSIS — F331 Major depressive disorder, recurrent, moderate: Secondary | ICD-10-CM

## 2019-08-16 MED ORDER — ONDANSETRON HCL 4 MG PO TABS: 4.0000 mg | ORAL_TABLET | Freq: Every day | ORAL | 0 refills | Status: DC | PRN

## 2019-08-16 NOTE — Progress Notes (Signed)
zofran

## 2019-08-16 NOTE — Patient Instructions (Signed)
Social Worker Visit Information  Goals we discussed today:  Goals Addressed            This Visit's Progress     Patient Stated   . "I have a concussion" (pt-stated)       CARE PLAN ENTRY (see longtitudinal plan of care for additional care plan information)  Current Barriers:  . Recent head trauma resulting in ED visit on 4/3 to perform CT scan . Ongoing headache and fatigue . Huntington's Disease  Social Work Clinical Goal(s):  [name] Over the next 7 days the patient will follow up with her primary care provider as instructed in ED after visit summary . Over the next 30 days the patient will report adverse concerns to her primary care team  CCM SW Interventions: Completed 08/16/2019 . Performed chart review to note urgent care and Ed visit on 08/11/2019 following head injury which occurred on unknown date o Diagnosis of concussion . Assessed for ongoing signs/ symptoms of concussion o Patient reports ongoing headache and nausea but reports some improvement . Reviewed after visit summary which indicated follow up with primary care provider in 1 week o Confirmed follow up appointment schedule with [name], FNP for Monday April 12 . Advised the patient to contact her primary care provider prior to scheduled appointment for worsening symptoms . Collaboration with RN Care Manager to provide updated information regarding today's interventions  Patient Self Care Activities:  . Patient verbalizes understanding of plan to contact primary care provider for worsening symptoms of concussion . Self administers medications as prescribed . Attends all scheduled provider appointments . Performs ADL's independently . Calls provider office for new concerns or questions  Initial goal documentation       Other   . Assist with care coordination needs related to social determinants of health       Current Barriers:  . Huntington's Disease impacting ability to perform iADL's . Limited social  support . Lacks knowledge of local community resources to assist with transportation . Limited income resulting in food insecurity  Social Work Clinical Goal(s):  [name] Over the next 45 days the patient will become more knowledgeable of local food pantry's to assist with food insecurity . Over the next 45 days the patient will work with SW to become more knowledgeable of transportation resources Goal Met 07/17/2019 . Over the next 60 days the patient will work with SW to complete and submit a SCAT application Goal Met 1/94/1740 . New 08/07/19- Over the next 60 days the patient will work with SW to address concerns related to transferring Medicaid benefit to state of [place] in order to obtain access to a more suited health plan  CCM SW Interventions: Completed 08/16/2019 . Outbound call placed to the patient to determine progression of patient goal to switch Medicaid benefit to [place] . Determined the patient has received another month of Medicaid MQB benefits from [place] to cover the cost of her Medicare premiums o The patient reports she has contacted a representative with [place] Medicaid earlier in the week who reported she would write a letter to [facility] regarding stopping benefits o The patient plans to contact this representative again in the next 24 hours to follow up on progression . Discussed opportunity to also contact Medicare to report concern if not resolved . Advised the patient SW would contact over the next week to assess outcome of call to [place] . Received voice message from the patient who reports follow up to  [place] who reports benefit to be scheduled to stop on the 31st . Scheduled follow up appointment over the next month to assess outcome  Patient Self Care Activities:  . Patient verbalizes understanding of plan to work with CM team to address care coordination needs . Self administers medications as prescribed . Attends all scheduled provider  appointments . Calls pharmacy for medication refills  Please see past updates related to this goal by clicking on the "Past Updates" button in the selected goal          Follow Up Plan: SW will follow up with patient by phone over the next month.   Daneen Schick, BSW, CDP Social Worker, Certified Dementia Practitioner [place] [phone]

## 2019-08-16 NOTE — Telephone Encounter (Signed)
I returned the call to the patient. She is resting and feeling some better now. She just wanted the issue noted at our office. She plans to follow up w/ PCP to be monitored for progress. She verbalized understanding to minimize stimulating activites (limit cell phone, TV, computer time, etc).
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
## 2019-08-20 ENCOUNTER — Encounter: Payer: Self-pay | Admitting: Nurse Practitioner

## 2019-08-20 ENCOUNTER — Other Ambulatory Visit: Payer: Self-pay

## 2019-08-20 ENCOUNTER — Ambulatory Visit (INDEPENDENT_AMBULATORY_CARE_PROVIDER_SITE_OTHER): Payer: Medicare HMO | Admitting: Nurse Practitioner

## 2019-08-20 VITALS — BP 128/80 | HR 94 | Temp 98.4°F | Ht 68.0 in | Wt 203.4 lb

## 2019-08-20 DIAGNOSIS — G44319 Acute post-traumatic headache, not intractable: Secondary | ICD-10-CM | POA: Diagnosis not present

## 2019-08-20 DIAGNOSIS — S060X0D Concussion without loss of consciousness, subsequent encounter: Secondary | ICD-10-CM | POA: Diagnosis not present

## 2019-08-20 MED ORDER — TRIAMCINOLONE ACETONIDE 40 MG/ML IJ SUSP
60.0000 mg | Freq: Once | INTRAMUSCULAR | Status: AC
  Administered 2019-08-20: 60 mg via INTRAMUSCULAR

## 2019-08-20 NOTE — Progress Notes (Signed)
This visit occurred during the SARS-CoV-2 public health emergency.  Safety protocols were in place, including screening questions prior to the visit, additional usage of staff PPE, and extensive cleaning of exam room while observing appropriate contact time as indicated for disinfecting solutions.  Subjective:     Patient ID: Katherine Gregory , female    DOB: [birth year] , 54 y.o.   MRN: [id number]   Chief Complaint  Patient presents with  . Concussion    patient stated she still has some nausea    HPI  Hit in the head with dresser  The dresser drawer that is built into the headboard. She did not lose consciousness that she is aware.   She feels like her involuntary movements related to her huntington's worsened.  Mostly of her right hand.   She was having nausea and dizziness. Continues to have some mild nausea last time was earlier this morning.  She is noticing she is sensitive to light even on her phone.   She has been taking tylenol and excedrin migraine  Headache  This is a new problem. The current episode started in the past 7 days. The problem occurs constantly. The problem has been unchanged. The pain is located in the left unilateral region. Radiates to: left neck  The pain is at a severity of 9/10 (now headache is 3/10, nagging and dull sensation). The pain is severe. Associated symptoms include dizziness, nausea and photophobia (having to change the contrast on her phone.). Pertinent negatives include no abdominal pain or loss of balance. She has tried acetaminophen for the symptoms. Her past medical history is significant for recent head traumas (drawer from headboard fell on her head).     Past Medical History:  Diagnosis Date  . Acid reflux   . Bleeding gastric ulcer   . Cellulitis   . Cervical back pain with evidence of disc disease   . Depression   . Dysuria   . FATIGUE   . Fibromyalgia   . FLANK PAIN, RIGHT   . GOITER   . HEADACHES, HX OF   . Huntington's  disease (HCC)   . IBS (irritable bowel syndrome)   . Palpitations   . Post partum depression   . Seasonal allergies   . URTICARIA   . Vertigo   . Vitamin D deficiency      Family History  Problem Relation Age of Onset  . Hypertension Mother   . Huntington's disease Mother        deceased 12-Aug-2022  . Diabetes Father   . Hypertension Father   . Heart disease Father   . Scoliosis Sister   . Huntington's disease Sister   . Diabetes Brother   . Hypertension Brother   . Scoliosis Brother   . Skin cancer Maternal Grandmother      Current Outpatient Medications:  .  B Complex Vitamins (VITAMIN B COMPLEX PO), Take by mouth. Take 1 tablet daily, Disp: , Rfl:  .  Calcium Carb-Cholecalciferol (CALCIUM 1000 + D) 1000-800 MG-UNIT TABS, Take by mouth. Take 1 tablet daily, Disp: , Rfl:  .  Cholecalciferol (VITAMIN D3) 50 MCG (2000 UT) TABS, Take by mouth. Take 1 tablet daily, Disp: , Rfl:  .  DULoxetine (CYMBALTA) 30 MG capsule, Take 2 tablets by mouth daily, Disp: 60 capsule, Rfl: 2 .  meclizine (ANTIVERT) 25 MG tablet, Take 1 tablet (25 mg total) by mouth 3 (three) times daily as needed for dizziness., Disp: 8 tablet, Rfl: 0 .  Multiple Vitamin (MULTIVITAMIN ADULT PO), Take by mouth. Take 1 tablet daily, Disp: , Rfl:  .  Multiple Vitamins-Minerals (AIRBORNE PO), Take 4 tablets by mouth daily., Disp: , Rfl:  .  Multiple Vitamins-Minerals (ZINC PO), Take 60 mg by mouth daily., Disp: , Rfl:  .  omeprazole (PRILOSEC) 40 MG capsule, Take 1 capsule (40 mg total) by mouth daily., Disp: 90 capsule, Rfl: 1 .  ondansetron (ZOFRAN) 4 MG tablet, Take 1 tablet (4 mg total) by mouth daily as needed for nausea or vomiting., Disp: 10 tablet, Rfl: 0 .  tetrabenazine (XENAZINE) 12.5 MG tablet, Take 1 tablet (12.5 mg total) by mouth 2 (two) times daily., Disp: 60 tablet, Rfl: 11 .  Wheat Dextrin (BENEFIBER PO), Take 1 tablet by mouth daily., Disp: , Rfl:    Allergies  Allergen Reactions  . Ibuprofen Other (See  Comments)    "It intensifies my pain." Made pain and symptoms worse. "It intensifies my pain."  . Morphine Itching    Red blotches on skin and burning.  . Morphine And Related Itching and Other (See Comments)    Reaction burns Reaction burns  . Penicillin G Itching  . Penicillins Hives and Itching    Has patient had a PCN reaction causing immediate rash, facial/tongue/throat swelling, SOB or lightheadedness with hypotension: Yes Has patient had a PCN reaction causing severe rash involving mucus membranes or skin necrosis: No Has patient had a PCN reaction that required hospitalization No Has patient had a PCN reaction occurring within the last 10 years: No If all of the above answers are "NO", then may proceed with Cephalosporin use.   [name] Risperdal [Risperidone] Other (See Comments)    Confusion, oversedation   . Adhesive [Tape] Itching    Itching and burning at site Itching and burning at site  . Pseudoephedrine Other (See Comments) and Palpitations    Speed up heart rate Speed up heart rate  . Vitamin [Cyanocobalamin] Hives and Rash     Review of Systems  Constitutional: Negative.   Eyes: Positive for photophobia (having to change the contrast on her phone.).  Respiratory: Negative.   Cardiovascular: Negative for chest pain, palpitations and leg swelling.  Gastrointestinal: Positive for nausea. Negative for abdominal pain.  Neurological: Positive for dizziness and headaches. Negative for loss of balance.     Today's Vitals   08/20/19 1614  BP: 128/80  Pulse: 94  Temp: 98.4 F (36.9 C)  TempSrc: Oral  Weight: 203 lb 6.4 oz (92.3 kg)  Height: 5\' 8"  (1.727 m)  PainSc: 4   PainLoc: Head   Body mass index is 30.93 kg/m.   Objective:  Physical Exam Constitutional:      General: She is not in acute distress.    Appearance: Normal appearance.  Cardiovascular:     Rate and Rhythm: Normal rate and regular rhythm.     Pulses: Normal pulses.     Heart sounds: Normal  heart sounds. No murmur.  Pulmonary:     Effort: Pulmonary effort is normal. No respiratory distress.     Breath sounds: Normal breath sounds.  Skin:    General: Skin is warm and dry.     Capillary Refill: Capillary refill takes less than 2 seconds.     Coloration: Skin is not jaundiced.     Findings: No bruising.  Neurological:     General: No focal deficit present.     Mental Status: She is alert and oriented to person, place, and time.  Cranial Nerves: No cranial nerve deficit.  Psychiatric:        Mood and Affect: Mood normal.        Behavior: Behavior normal.        Thought Content: Thought content normal.        Judgment: Judgment normal.         Assessment And Plan:     1. Acute post-traumatic headache, not intractable  After hitting her head with a dresser drawer she continues to have a headache. Was seen at the ER with a CT scan of the head done which was normal.   Will treat with kenalog 60 mg IM - triamcinolone acetonide (KENALOG-40) injection 60 mg  2. Concussion without loss of consciousness, subsequent encounter  CT scan of head was negative  She does report having difficulty with gathering words at times since the accident.  Return if symptoms worsen and advised may take several weeks to improve to months. - triamcinolone acetonide (KENALOG-40) injection 60 mg  Arnette Felts, FNP    THE PATIENT IS ENCOURAGED TO PRACTICE SOCIAL DISTANCING DUE TO THE COVID-19 PANDEMIC.

## 2019-08-20 NOTE — Patient Instructions (Signed)
Concussion, Adult  A concussion is a brain injury from a hard, direct hit (trauma) to your head or body. This direct hit causes the brain to quickly shake back and forth inside the skull. A concussion may also be called a mild traumatic brain injury (TBI). Healing from this injury can take time. What are the causes? This condition is caused by:  A direct hit to your head, such as: ? Running into a player during a game. ? Being hit in a fight. ? Hitting your head on a hard surface.  A quick and sudden movement (jolt) of the head or neck, such as in a car crash. What are the signs or symptoms? The signs of a concussion can be hard to notice. They may be missed by you, family members, and doctors. You may look fine on the outside but may not act or feel normal. Physical symptoms  Headaches.  Being tired (fatigued).  Being dizzy.  Problems with body balance.  Problems seeing or hearing.  Being sensitive to light or noise.  Feeling sick to your stomach (nausea) or throwing up (vomiting).  Not sleeping or eating as you used to.  Loss of feeling (numbness) or tingling in the body.  Seizure. Mental and emotional symptoms  Problems remembering things.  Trouble focusing your mind (concentrating), organizing, or making decisions.  Being slow to think, act, react, speak, or read.  Feeling grouchy (irritable).  Having mood changes.  Feeling worried or nervous (anxious).  Feeling sad (depressed). How is this treated? This condition may be treated by:  Stopping sports or activity if you are injured. If you hit your head or have signs of concussion: ? Do not return to sports or activities the same day. ? Get checked by a doctor before you return to your activities.  Resting your body and your mind.  Being watched carefully, often at home.  Medicines to help with symptoms such as: ? Feeling sick to your stomach. ? Headaches. ? Problems with sleep.  Avoid taking strong  pain medicines (opioids) for a concussion.  Avoiding alcohol and drugs.  Being asked to go to a concussion clinic or a place to help you recover (rehabilitation center). Recovery from a concussion can take time. Return to activities only:  When you are fully healed.  When your doctor says it is safe. Follow these instructions at home: Activity  Limit activities that need a lot of thought or focus, such as: ? Homework or work for your job. ? Watching TV. ? Using the computer or phone. ? Playing memory games and puzzles.  Rest. Rest helps your brain heal. Make sure you: ? Get plenty of sleep. Most adults should get 7-9 hours of sleep each night. ? Rest during the day. Take naps or breaks when you feel tired.  Avoid activity like exercise until your doctor says its safe. Stop any activity that makes symptoms worse.  Do not do activities that could cause a second concussion, such as riding a bike or playing sports.  Ask your doctor when you can return to your normal activities, such as school, work, sports, and driving. Your ability to react may be slower. Do not do these activities if you are dizzy. General instructions   Take over-the-counter and prescription medicines only as told by your doctor.  Do not drink alcohol until your doctor says you can.  Watch your symptoms and tell other people to do the same. Other problems can occur after a concussion. Older   adults have a higher risk of serious problems.  Tell your work manager, teachers, school nurse, school counselor, coach, or athletic trainer about your injury and symptoms. Tell them about what you can or cannot do.  Keep all follow-up visits as told by your doctor. This is important. How is this prevented?  It is very important that you do not get another brain injury. In rare cases, another injury can cause brain damage that will not go away, brain swelling, or death. The risk of this is greatest in the first 7-10 days  after a head injury. To avoid injuries: ? Stop activities that could lead to a second concussion, such as contact sports, until your doctor says it is okay. ? When you return to sports or activities:  Do not crash into other players. This is how most concussions happen.  Follow the rules.  Respect other players. ? Get regular exercise. Do strength and balance training. ? Wear a helmet that fits you well during sports, biking, or other activities.  Helmets can help protect you from serious skull and brain injuries, but they do not protect you from a concussion. Even when wearing a helmet, you should avoid being hit in the head. Contact a doctor if:  Your symptoms get worse or they do not get better.  You have new symptoms.  You have another injury. Get help right away if:  You have bad headaches or your headaches get worse.  You feel weak or numb in any part of your body.  You are mixed up (confused).  Your balance gets worse.  You keep throwing up.  You feel more sleepy than normal.  Your speech is not clear (is slurred).  You cannot recognize people or places.  You have a seizure.  Others have trouble waking you up.  You have behavior changes.  You have changes in how you see (vision).  You pass out (lose consciousness). Summary  A concussion is a brain injury from a hard, direct hit (trauma) to your head or body.  This condition is treated with rest and careful watching of symptoms.  If you keep having symptoms, call your doctor. This information is not intended to replace advice given to you by your health care provider. Make sure you discuss any questions you have with your health care provider. Document Revised: 12/15/2017 Document Reviewed: 12/15/2017 Elsevier Patient Education  2020 [employer]  

## 2019-08-24 ENCOUNTER — Telehealth: Payer: Self-pay

## 2019-08-27 ENCOUNTER — Telehealth: Payer: Self-pay

## 2019-08-27 ENCOUNTER — Other Ambulatory Visit: Payer: Self-pay

## 2019-08-27 ENCOUNTER — Ambulatory Visit (INDEPENDENT_AMBULATORY_CARE_PROVIDER_SITE_OTHER): Payer: Medicare HMO

## 2019-08-27 DIAGNOSIS — G10 Huntington's disease: Secondary | ICD-10-CM

## 2019-08-27 DIAGNOSIS — E559 Vitamin D deficiency, unspecified: Secondary | ICD-10-CM

## 2019-08-27 DIAGNOSIS — F331 Major depressive disorder, recurrent, moderate: Secondary | ICD-10-CM

## 2019-08-27 DIAGNOSIS — S060X0D Concussion without loss of consciousness, subsequent encounter: Secondary | ICD-10-CM

## 2019-08-28 NOTE — Patient Instructions (Signed)
Visit Information  Goals Addressed      Patient Stated   . "I have a concussion" (pt-stated)       CARE PLAN ENTRY (see longtitudinal plan of care for additional care plan information)  Current Barriers:  . Recent head trauma resulting in ED visit on 4/3 to perform CT scan . Ongoing headache and fatigue . Huntington's Disease  Social Work Clinical Goal(s):  [name] Over the next 7 days the patient will follow up with her primary care provider as instructed in ED after visit summary . Over the next 30 days the patient will report adverse concerns to her primary care team  CCM SW Interventions: Completed 08/16/2019 . Performed chart review to note urgent care and Ed visit on 08/11/2019 following head injury which occurred on unknown date o Diagnosis of concussion . Assessed for ongoing signs/ symptoms of concussion o Patient reports ongoing headache and nausea but reports some improvement . Reviewed after visit summary which indicated follow up with primary care provider in 1 week o Confirmed follow up appointment schedule with Arnette Felts, FNP for Monday April 12 . Advised the patient to contact her primary care provider prior to scheduled appointment for worsening symptoms . Collaboration with RN Care Manager to provide updated information regarding today's interventions  CCM RN CM Interventions: 08/27/19 call completed with patient  CARE PLAN ENTRY (see longitudinal plan of care for additional care plan information)  Current Barriers:  [name] Knowledge Deficits related to what to expect with disease process and recovery from head concussion  . Chronic Disease Management support and education needs related to Huntington's disease, Moderate episode of recurrent major depressive disorder   Interventions:  . Inter-disciplinary care team collaboration (see longitudinal plan of care) . Evaluation of current treatment plan related to head concussion w/o loss of consciousness and patient's  adherence to plan as established by provider. . Provided education to patient re: what to expect with recovering from recent head concussion; Reviewed and discussed PCP post ED follow up and recommendations: Determined patient feels her symptoms of headache, fatigue and dizziness have improved some . Reviewed medications with patient and discussed indication, dosage and frequency of prescribed antiemetic and antihistamine prescribed for symptom management; Encouraged patient to also try peppermint essential oils for nausea if desired . Discussed plans with patient for ongoing care management follow up and provided patient with direct contact information for care management team . Encouraged patient to notify the CCM team and or PCP of persistent or worsening symptoms suggestive of a change in her condition   Patient Self Care Activities:  . Patient verbalizes understanding of plan to contact primary care provider for worsening symptoms of concussion . Self administers medications as prescribed . Attends all scheduled provider appointments . Performs ADL's independently . Calls provider office for new concerns or questions  Initial goal documentation       Patient verbalizes understanding of instructions provided today.   Telephone follow up appointment with care management team member scheduled for: 09/27/19  Delsa Sale, RN, BSN, CCM Care Management Coordinator [facility] Care Management/[facility]  Direct Phone: [phone]

## 2019-08-28 NOTE — Chronic Care Management (AMB) (Signed)
Chronic Care Management   Follow Up Note   08/27/2019 Name: Katherine Gregory MRN: [id number] DOB: 1965/10/14  Referred by: Arnette Felts, FNP Reason for referral : Chronic Care Management ([facility] RN Call - Post ED)   Katherine Gregory is a 54 y.o. year old female who is a primary care patient of Arnette Felts, FNP. The CCM team was consulted for assistance with chronic disease management and care coordination needs.    Review of patient status, including review of consultants reports, relevant laboratory and other test results, and collaboration with appropriate care team members and the patient's provider was performed as part of comprehensive patient evaluation and provision of chronic care management services.    SDOH (Social Determinants of Health) assessments performed: No See Care Plan activities for detailed interventions related to SDOH)   Placed outbound CCM RN CM post ED follow up call to patient to assess for CCM needs.     Outpatient Encounter Medications as of 08/27/2019  Medication Sig  . B Complex Vitamins (VITAMIN B COMPLEX PO) Take by mouth. Take 1 tablet daily  . Calcium Carb-Cholecalciferol (CALCIUM 1000 + D) 1000-800 MG-UNIT TABS Take by mouth. Take 1 tablet daily  . Cholecalciferol (VITAMIN D3) 50 MCG (2000 UT) TABS Take by mouth. Take 1 tablet daily  . DULoxetine (CYMBALTA) 30 MG capsule Take 2 tablets by mouth daily  . meclizine (ANTIVERT) 25 MG tablet Take 1 tablet (25 mg total) by mouth 3 (three) times daily as needed for dizziness.  . Multiple Vitamin (MULTIVITAMIN ADULT PO) Take by mouth. Take 1 tablet daily  . Multiple Vitamins-Minerals (AIRBORNE PO) Take 4 tablets by mouth daily.  . Multiple Vitamins-Minerals (ZINC PO) Take 60 mg by mouth daily.  [name] omeprazole (PRILOSEC) 40 MG capsule Take 1 capsule (40 mg total) by mouth daily.  . ondansetron (ZOFRAN) 4 MG tablet Take 1 tablet (4 mg total) by mouth daily as needed for nausea or vomiting.  [name]  tetrabenazine (XENAZINE) 12.5 MG tablet Take 1 tablet (12.5 mg total) by mouth 2 (two) times daily.  . Wheat Dextrin (BENEFIBER PO) Take 1 tablet by mouth daily.   No facility-administered encounter medications on file as of 08/27/2019.     Objective:  Lab Results  Component Value Date   HGBA1C 5.4 05/29/2019   HGBA1C 5.9 04/28/2016   HGBA1C 5.6 11/16/2013   Lab Results  Component Value Date   LDLCALC 146 (H) 05/29/2019   CREATININE 1.11 (H) 05/29/2019   BP Readings from Last 3 Encounters:  08/20/19 128/80  08/11/19 (!) 137/98  08/11/19 (!) 136/92    Goals Addressed            This Visit's Progress     Patient Stated   . "I have a concussion" (pt-stated)       CARE PLAN ENTRY (see longtitudinal plan of care for additional care plan information)  Current Barriers:  . Recent head trauma resulting in ED visit on 4/3 to perform CT scan . Ongoing headache and fatigue . Huntington's Disease  Social Work Clinical Goal(s):  [name] Over the next 7 days the patient will follow up with her primary care provider as instructed in ED after visit summary . Over the next 30 days the patient will report adverse concerns to her primary care team  CCM SW Interventions: Completed 08/16/2019 . Performed chart review to note urgent care and Ed visit on 08/11/2019 following head injury which occurred on unknown date o Diagnosis of concussion .  Assessed for ongoing signs/ symptoms of concussion o Patient reports ongoing headache and nausea but reports some improvement . Reviewed after visit summary which indicated follow up with primary care provider in 1 week o Confirmed follow up appointment schedule with [name], FNP for Monday April 12 . Advised the patient to contact her primary care provider prior to scheduled appointment for worsening symptoms . Collaboration with RN Care Manager to provide updated information regarding today's interventions  CCM RN CM Interventions: 08/27/19 call  completed with patient  [place] (see longitudinal plan of care for additional care plan information)  Current Barriers:  [name] Knowledge Deficits related to what to expect with disease process and recovery from head concussion  . Chronic Disease Management support and education needs related to Huntington's disease, Moderate episode of recurrent major depressive disorder   Interventions:  . Inter-disciplinary care team collaboration (see longitudinal plan of care) . Evaluation of current treatment plan related to head concussion w/o loss of consciousness and patient's adherence to plan as established by provider. . Provided education to patient re: what to expect with recovering from recent head concussion; Reviewed and discussed PCP post ED follow up and recommendations: Determined patient feels her symptoms of headache, fatigue and dizziness have improved some . Reviewed medications with patient and discussed indication, dosage and frequency of prescribed antiemetic and antihistamine prescribed for symptom management; Encouraged patient to also try peppermint essential oils for nausea if desired . Discussed plans with patient for ongoing care management follow up and provided patient with direct contact information for care management team . Encouraged patient to notify the CCM team and or PCP of persistent or worsening symptoms suggestive of a change in her condition   Patient Self Care Activities:  . Patient verbalizes understanding of plan to contact primary care provider for worsening symptoms of concussion . Self administers medications as prescribed . Attends all scheduled provider appointments . Performs ADL's independently . Calls provider office for new concerns or questions  Initial goal documentation        Plan:   Telephone follow up appointment with care management team member scheduled for: 09/27/19  Barb Merino, RN, BSN, CCM Care Management Coordinator [place]  Management/[facility]  Direct Phone: [phone]

## 2019-08-31 ENCOUNTER — Other Ambulatory Visit: Payer: Self-pay | Admitting: Nurse Practitioner

## 2019-09-10 ENCOUNTER — Other Ambulatory Visit: Payer: Self-pay

## 2019-09-10 MED ORDER — OMEPRAZOLE 40 MG PO CPDR: DELAYED_RELEASE_CAPSULE | ORAL | 1 refills | Status: DC

## 2019-09-13 ENCOUNTER — Telehealth: Payer: Self-pay

## 2019-09-13 ENCOUNTER — Ambulatory Visit: Payer: Self-pay

## 2019-09-13 DIAGNOSIS — F331 Major depressive disorder, recurrent, moderate: Secondary | ICD-10-CM

## 2019-09-13 DIAGNOSIS — G10 Huntington's disease: Secondary | ICD-10-CM

## 2019-09-13 NOTE — Chronic Care Management (AMB) (Signed)
  Chronic Care Management   Outreach Note  09/13/2019 Name: Katherine Gregory MRN: [id number] DOB: [birth year]  Referred by: Arnette Felts, FNP Reason for referral : Care Coordination   SW placed an unsuccessful outbound call to the patient to assist with care coordination needs. SW left a HIPAA compliant voice message requesting a return call.  Follow Up Plan: The care management team will reach out to the patient again over the next 10 days.   Bevelyn Ngo, BSW, CDP Social Worker, Certified Dementia Practitioner [facility] Care Management [phone]

## 2019-09-14 ENCOUNTER — Ambulatory Visit: Payer: Medicare HMO

## 2019-09-16 ENCOUNTER — Other Ambulatory Visit: Payer: Self-pay | Admitting: Nurse Practitioner

## 2019-09-16 DIAGNOSIS — F331 Major depressive disorder, recurrent, moderate: Secondary | ICD-10-CM

## 2019-09-19 ENCOUNTER — Ambulatory Visit: Payer: Self-pay

## 2019-09-19 DIAGNOSIS — G10 Huntington's disease: Secondary | ICD-10-CM

## 2019-09-19 DIAGNOSIS — S060X0D Concussion without loss of consciousness, subsequent encounter: Secondary | ICD-10-CM

## 2019-09-19 DIAGNOSIS — F331 Major depressive disorder, recurrent, moderate: Secondary | ICD-10-CM

## 2019-09-20 ENCOUNTER — Encounter (HOSPITAL_COMMUNITY): Payer: Self-pay | Admitting: Emergency Medicine

## 2019-09-20 ENCOUNTER — Emergency Department (HOSPITAL_COMMUNITY): Payer: Medicare HMO

## 2019-09-20 ENCOUNTER — Other Ambulatory Visit: Payer: Self-pay

## 2019-09-20 ENCOUNTER — Emergency Department (HOSPITAL_COMMUNITY)
Admission: EM | Admit: 2019-09-20 | Discharge: 2019-09-20 | Disposition: A | Discharge status: Home / Self Care | Payer: Medicare HMO | Attending: Emergency Medicine | Admitting: Emergency Medicine

## 2019-09-20 DIAGNOSIS — M79621 Pain in right upper arm: Secondary | ICD-10-CM | POA: Diagnosis not present

## 2019-09-20 DIAGNOSIS — Z79899 Other long term (current) drug therapy: Secondary | ICD-10-CM | POA: Insufficient documentation

## 2019-09-20 DIAGNOSIS — I10 Essential (primary) hypertension: Secondary | ICD-10-CM | POA: Insufficient documentation

## 2019-09-20 MED ORDER — 1ST MEDX-PATCH/ LIDOCAINE 4-0.0375-5-20 % EX PTCH: 1.0000 | MEDICATED_PATCH | Freq: Three times a day (TID) | CUTANEOUS | 0 refills | Status: AC | PRN

## 2019-09-20 MED ORDER — METHOCARBAMOL 500 MG PO TABS: 500.0000 mg | ORAL_TABLET | Freq: Two times a day (BID) | ORAL | 0 refills | Status: DC

## 2019-09-20 MED ORDER — METHOCARBAMOL 500 MG PO TABS
1000.0000 mg | ORAL_TABLET | Freq: Once | ORAL | Status: AC
  Administered 2019-09-20: 1000 mg via ORAL
  Filled 2019-09-20: qty 2

## 2019-09-20 MED ORDER — ACETAMINOPHEN 325 MG PO TABS
650.0000 mg | ORAL_TABLET | Freq: Once | ORAL | Status: AC
  Administered 2019-09-20: 650 mg via ORAL
  Filled 2019-09-20: qty 2

## 2019-09-20 MED ORDER — LIDOCAINE 5 % EX PTCH
1.0000 | MEDICATED_PATCH | CUTANEOUS | Status: DC
  Administered 2019-09-20: 1 via TRANSDERMAL
  Filled 2019-09-20: qty 1

## 2019-09-20 NOTE — ED Triage Notes (Signed)
Pt arrives via [facility] from home with c/o R axillary pain that began suddenly, denies any injury. States pain is better with arm is raised over her head. Denies chest pain or sob.

## 2019-09-20 NOTE — ED Provider Notes (Signed)
[facility] EMERGENCY DEPARTMENT Provider Note   CSN: [id number] Arrival date & time: 09/20/19  1252     History Chief Complaint  Patient presents with  . axillary pain    Katherine Gregory is a 54 y.o. female.  HPI  Patient is a 54 year old female with past medical history significant for acid reflux, fibromyalgia, chronic headaches, Huntington's disease, IBS, heart palpitations, vertigo and depression  Patient presents today for right armpit pain that began when she pushes self out of bed this morning.  She states that it began suddenly and has been severe, sharp, stabbing, constant until last 2 hours at which point it seemed to improve somewhat.  She states that she has no associated symptoms.  She denies any chest pain, shortness of breath, nausea, vomiting, diaphoresis, denies any fevers, night sweats, weight loss, breast masses, history of cancer.  Patient states that she is on disability because of her fibromyalgia and Huntington's disease.  She is not doing heavy lifting that she knows of but states that she did put herself out of bed this morning when the pain began.    Past Medical History:  Diagnosis Date  . Acid reflux   . Bleeding gastric ulcer   . Cellulitis   . Cervical back pain with evidence of disc disease   . Depression   . Dysuria   . FATIGUE   . Fibromyalgia   . FLANK PAIN, RIGHT   . GOITER   . HEADACHES, HX OF   . Huntington's disease (HCC)   . IBS (irritable bowel syndrome)   . Palpitations   . Post partum depression   . Seasonal allergies   . URTICARIA   . Vertigo   . Vitamin D deficiency     Patient Active Problem List   Diagnosis Date Noted  . Gait abnormality 06/19/2019  . Polypharmacy 06/17/2016  . BMI 30.0-30.9,adult 05/14/2016  . Benign paroxysmal positional vertigo 04/28/2016  . Insomnia 04/28/2016  . Depression, major, recurrent (HCC) 04/28/2016  . Vitamin D deficiency 04/28/2016  . Polyp at cervical os  01/24/2015  . Huntington's disease (HCC) 06/20/2014  . Abnormal uterine bleeding 11/16/2013  . Bleeding gastric ulcer   . Cervical back pain with evidence of disc disease   . Chest pain 05/16/2012  . Dizziness 05/16/2012  . Abdominal pain 05/16/2012  . Atypical chest pain 04/21/2012  . Hot flashes 03/10/2012  . Fibromyalgia 03/10/2012  . Overweight(278.02) 01/24/2012  . Borderline systolic HTN 11/25/2011  . FLANK PAIN, RIGHT 10/26/2007  . Fatigue 10/19/2007  . DYSURIA 10/19/2007  . ALLERGIC RHINITIS 08/17/2007  . GOITER 07/25/2007  . GERD 07/25/2007  . PALPITATIONS 07/25/2007  . HEADACHES, HX OF 07/25/2007    Past Surgical History:  Procedure Laterality Date  . CARDIAC CATHETERIZATION     05/17/12 / waiting on results  . CARTILAGE SURGERY     RT wrist  . CESAREAN SECTION  1988 & 2008   x 2  . CHOLECYSTECTOMY    . DILITATION & CURRETTAGE/HYSTROSCOPY WITH HYDROTHERMAL ABLATION N/A 01/24/2015   Procedure: DILATATION & CURETTAGE/HYSTEROSCOPY WITH HYDROTHERMAL ABLATION;  Surgeon: Brock Bad, MD;  Location: [facility];  Service: Gynecology;  Laterality: N/A;  Ethelene Browns will be here  . WISDOM TOOTH EXTRACTION       OB History    Gravida  4   Para  2   Term  2   Preterm      AB  2   Living  2  SAB  1   TAB  1   Ectopic      Multiple      Live Births  2           Family History  Problem Relation Age of Onset  . Hypertension Mother   . Huntington's disease Mother        deceased 08-08-22  . Diabetes Father   . Hypertension Father   . Heart disease Father   . Scoliosis Sister   . Huntington's disease Sister   . Diabetes Brother   . Hypertension Brother   . Scoliosis Brother   . Skin cancer Maternal Grandmother     Social History   Tobacco Use  . Smoking status: Never Smoker  . Smokeless tobacco: Never Used  Substance Use Topics  . Alcohol use: Not Currently    Comment: seldom  . Drug use: No    Home Medications Prior to Admission  medications   Medication Sig Start Date End Date Taking? Authorizing Provider  B Complex Vitamins (VITAMIN B COMPLEX PO) Take by mouth. Take 1 tablet daily    [provider]  Calcium Carb-Cholecalciferol (CALCIUM 1000 + D) 1000-800 MG-UNIT TABS Take by mouth. Take 1 tablet daily    [provider]  Cholecalciferol (VITAMIN D3) 50 MCG (2000 UT) TABS Take by mouth. Take 1 tablet daily    [provider]  DULoxetine (CYMBALTA) 30 MG capsule TAKE 2 CAPSULES BY MOUTH DAILY 09/16/19   Arnette Felts, FNP  Lido-Capsaicin-Men-Methyl Sal (1ST MEDX-PATCH/ LIDOCAINE) 4-0.373-09-26 % PTCH Apply 1 patch topically 3 (three) times daily as needed for up to 5 days (for pain). 09/20/19 09/25/19  Gailen Shelter, PA  meclizine (ANTIVERT) 25 MG tablet Take 1 tablet (25 mg total) by mouth 3 (three) times daily as needed for dizziness. 08/11/19   Tilden Fossa, MD  methocarbamol (ROBAXIN) 500 MG tablet Take 1 tablet (500 mg total) by mouth 2 (two) times daily. 09/20/19   Gailen Shelter, PA  Multiple Vitamin (MULTIVITAMIN ADULT PO) Take by mouth. Take 1 tablet daily    [provider]  Multiple Vitamins-Minerals (AIRBORNE PO) Take 4 tablets by mouth daily.    [provider]  Multiple Vitamins-Minerals (ZINC PO) Take 60 mg by mouth daily.    [provider]  omeprazole (PRILOSEC) 40 MG capsule TAKE 1 CAPSULE BY MOUTH EVERY DAY 09/10/19   Arnette Felts, FNP  ondansetron (ZOFRAN) 4 MG tablet Take 1 tablet (4 mg total) by mouth daily as needed for nausea or vomiting. 08/16/19 08/15/20  Arnette Felts, FNP  tetrabenazine Guinevere Scarlet) 12.5 MG tablet Take 1 tablet (12.5 mg total) by mouth 2 (two) times daily. 06/19/19   Levert Feinstein, MD  Wheat Dextrin (BENEFIBER PO) Take 1 tablet by mouth daily.    [provider]    Allergies    Ibuprofen, Morphine, Morphine and related, Penicillin g, Penicillins, Risperdal [risperidone], Adhesive [tape], Pseudoephedrine, and Vitamin  [cyanocobalamin]  Review of Systems   Review of Systems  Constitutional: Negative for fever.  HENT: Negative for congestion.   Respiratory: Negative for shortness of breath.   Cardiovascular: Negative for chest pain.  Gastrointestinal: Negative for abdominal distention.  Musculoskeletal:       Right armpit pain/trapezius pain  Neurological: Negative for dizziness and headaches.    Physical Exam Updated Vital Signs BP (!) 139/98   Pulse 79   Temp 99 F (37.2 C) (Oral)   Resp 16   Ht 5\' 10"  (1.778  m)   Wt 91.6 kg   SpO2 98%   BMI 28.98 kg/m   Physical Exam Vitals and nursing note reviewed.  Constitutional:      General: She is not in acute distress.    Appearance: Normal appearance. She is not ill-appearing.  HENT:     Head: Normocephalic and atraumatic.     Nose: Nose normal.     Mouth/Throat:     Mouth: Mucous membranes are moist.  Eyes:     General: No scleral icterus.       Right eye: No discharge.        Left eye: No discharge.     Conjunctiva/sclera: Conjunctivae normal.  Cardiovascular:     Rate and Rhythm: Normal rate and regular rhythm.     Pulses: Normal pulses.     Heart sounds: Normal heart sounds.  Pulmonary:     Effort: Pulmonary effort is normal. No respiratory distress.     Breath sounds: No stridor. No wheezing.  Abdominal:     Palpations: Abdomen is soft.     Tenderness: There is no abdominal tenderness. There is no right CVA tenderness, left CVA tenderness, guarding or rebound.  Musculoskeletal:     Cervical back: Normal range of motion.     Right lower leg: No edema.     Left lower leg: No edema.     Comments: Mild tenderness to palpation of the right axilla with much more significant tenderness palpation of the right trapezius on the lateral side of her right torso  Full range of motion of arm but with significant pain when brought in full abduction.  Patient refuses to voluntarily move her arm however will allow me to range her shoulder.   There is no tenderness to palpation of the shoulder, biceps, forearm or hand.  Strength 5/5 bilateral hands.  Pulses 3+ symmetric radial  Skin:    General: Skin is warm and dry.     Capillary Refill: Capillary refill takes less than 2 seconds.     Comments: No herpetic rash or cellulitis of axilla.  No lymphadenopathy.  No cysts or abscesses palpable.  Neurological:     Mental Status: She is alert and oriented to person, place, and time. Mental status is at baseline.     Comments: Sensation intact bilateral hands.  Good cap refill.  Good pulses in radius  Psychiatric:        Mood and Affect: Mood normal.        Behavior: Behavior normal.     ED Results / Procedures / Treatments   Labs (all labs ordered are listed, but only abnormal results are displayed) Labs Reviewed - No data to display  EKG EKG Interpretation  Date/Time:  Thursday Sep 20 2019 12:56:04 EDT Ventricular Rate:  76 PR Interval:  128 QRS Duration: 84 QT Interval:  402 QTC Calculation: 452 R Axis:   13 Text Interpretation: Normal sinus rhythm T wave abnormality, consider inferior ischemia T wave abnormality, consider anterolateral ischemia Abnormal ECG similar to prior EKGs Confirmed by Rolan Bucco [phone]) on 09/20/2019 4:02:53 PM   Radiology DG Ribs Unilateral W/Chest Right  Result Date: 09/20/2019 CLINICAL DATA:  Right-sided rib pain, no known injury, initial encounter EXAM: RIGHT RIBS AND CHEST - 3+ VIEW COMPARISON:  08/19/2012 FINDINGS: Cardiac shadow is at the upper limits of normal in size. The lungs are well aerated bilaterally. No focal infiltrate or sizable effusion is seen. No pneumothorax is noted. No rib fractures are noted. IMPRESSION:  No acute abnormality noted. Electronically Signed   By: Inez Catalina M.D.   On: 09/20/2019 15:49    Procedures Procedures (including critical care time)  Medications Ordered in ED Medications  lidocaine (LIDODERM) 5 % 1 patch (1 patch Transdermal Patch Applied  09/20/19 1616)  acetaminophen (TYLENOL) tablet 650 mg (650 mg Oral Given by Other 09/20/19 1419)  methocarbamol (ROBAXIN) tablet 1,000 mg (1,000 mg Oral Given 09/20/19 1615)    ED Course  I have reviewed the triage vital signs and the nursing notes.  Pertinent labs & imaging results that were available during my care of the patient were reviewed by me and considered in my medical decision making (see chart for details).  Clinical Course as of Sep 20 1730  Thu Sep 20, 2019  1555 DG ribs with chest xray obtained to RO rib fracture.  I independently reviewed these x-rays.   [WF]  1607 EKG reviewed by Dr. Tamera Punt.  No acute abnormality.   [WF]    Clinical Course User Index [WF] Tedd Sias, [place]   MDM Rules/Calculators/A&P                      Patient with reproducible right axillary pain. She states that her pain came on this AM while getting out of bed.  She states that it is dramatically improved since that time. She states that it was initially extremely painful and has gotten somewhat better.  On my exam patient has reproducible right axillary tenderness with more significant right trapezius tenderness with 2 trigger points. Provided patient with single dose of Robaxin apply lidocaine patch.  Pt has no CP/SOB/N/V/D HA or dizziness. Doubt acute medical condition causing her symptoms today. Will treat conservatively at home if sx improve with robaxin and lidoderm.   On reevaluation patient's pain is significantly improved. Her blood pressure has improved from originally 172/127 to 139/98 without intervention other than muscle relaxer. Patient's vital signs were within normal apart from hypertension and mild tachycardia both of which dramatically improved.  We will discharge patient with medications that I provide her today. She is well-appearing at this time. Pain is increased vital signs of improved. Patient given return precautions.  Final Clinical Impression(s) / ED  Diagnoses Final diagnoses:  Pain in right axilla    Rx / DC Orders ED Discharge Orders         Ordered    methocarbamol (ROBAXIN) 500 MG tablet  2 times daily     09/20/19 1610    Lido-Capsaicin-Men-Methyl Sal (1ST MEDX-PATCH/ LIDOCAINE) 4-0.373-09-26 % PTCH  3 times daily PRN     09/20/19 1610           Pati Gallo Lucky, [place] 09/20/19 1733    Pattricia Boss, MD 09/22/19 1051

## 2019-09-20 NOTE — Discharge Instructions (Addendum)
You have no broken ribs, no fractures, no concerning findings on chest x-ray.  I suspect this is a muscular injury as you are very tender over the muscles of the side of your back all the trapezius I recommend a muscle relaxer which I will send you home with.  Also recommend lidocaine patches which I will prescribe you.  Please use these regularly and follow-up with your primary care doctor.  I recommend that you have your mammogram scheduled soon.  In addition to the muscle relaxer please use the following for analgesia Please use Tylenol or ibuprofen for pain.  You may use 600 mg ibuprofen every 6 hours or 1000 mg of Tylenol every 6 hours.  You may choose to alternate between the 2.  This would be most effective.  Not to exceed 4 g of Tylenol within 24 hours.  Not to exceed 3200 mg ibuprofen 24 hours.

## 2019-09-20 NOTE — Chronic Care Management (AMB) (Signed)
Chronic Care Management    Social Work Follow Up Note  09/19/19 Name: Katherine Gregory MRN: [id number] DOB: 1965/12/31  Katherine Gregory is a 54 y.o. year old female who is a primary care patient of [name], [place]. The CCM team was consulted for assistance with care coordination.   Review of patient status, including review of consultants reports, other relevant assessments, and collaboration with appropriate care team members and the patient's provider was performed as part of comprehensive patient evaluation and provision of chronic care management services.    SDOH (Social Determinants of Health) assessments performed: No    Outpatient Encounter Medications as of 09/19/2019  Medication Sig  . B Complex Vitamins (VITAMIN B COMPLEX PO) Take by mouth. Take 1 tablet daily  . Calcium Carb-Cholecalciferol (CALCIUM 1000 + D) 1000-800 MG-UNIT TABS Take by mouth. Take 1 tablet daily  . Cholecalciferol (VITAMIN D3) 50 MCG (2000 UT) TABS Take by mouth. Take 1 tablet daily  . DULoxetine (CYMBALTA) 30 MG capsule TAKE 2 CAPSULES BY MOUTH DAILY  . meclizine (ANTIVERT) 25 MG tablet Take 1 tablet (25 mg total) by mouth 3 (three) times daily as needed for dizziness.  . Multiple Vitamin (MULTIVITAMIN ADULT PO) Take by mouth. Take 1 tablet daily  . Multiple Vitamins-Minerals (AIRBORNE PO) Take 4 tablets by mouth daily.  . Multiple Vitamins-Minerals (ZINC PO) Take 60 mg by mouth daily.  [name] omeprazole (PRILOSEC) 40 MG capsule TAKE 1 CAPSULE BY MOUTH EVERY DAY  . ondansetron (ZOFRAN) 4 MG tablet Take 1 tablet (4 mg total) by mouth daily as needed for nausea or vomiting.  [name] tetrabenazine (XENAZINE) 12.5 MG tablet Take 1 tablet (12.5 mg total) by mouth 2 (two) times daily.  . Wheat Dextrin (BENEFIBER PO) Take 1 tablet by mouth daily.   No facility-administered encounter medications on file as of 09/19/2019.     Goals Addressed            This Visit's Progress     Patient Stated   .  COMPLETED: "I have a concussion" (pt-stated)       CARE PLAN ENTRY (see longtitudinal plan of care for additional care plan information)  Current Barriers:  . Recent head trauma resulting in ED visit on 4/3 to perform CT scan . Ongoing headache and fatigue . Huntington's Disease  Social Work Clinical Goal(s):  [name] Over the next 7 days the patient will follow up with her primary care provider as instructed in ED after visit summary . Over the next 30 days the patient will report adverse concerns to her primary care team  CCM SW Interventions: Completed 09/19/19 . Successful outbound call placed to the patient to assess progression of patient goal . Determined the patient is no longer experiencing headache as a result of recent concussion . Advised the patient to report any reoccurring symptoms to her primary provider . Collaboration with RN Care Manager to inform patient is no longer symptomatic from previous concussion . Goal Met  CCM RN CM Interventions: 08/27/19 call completed with patient  [place] (see longitudinal plan of care for additional care plan information)  Current Barriers:  [name] Knowledge Deficits related to what to expect with disease process and recovery from head concussion  . Chronic Disease Management support and education needs related to Huntington's disease, Moderate episode of recurrent major depressive disorder   Interventions:  . Inter-disciplinary care team collaboration (see longitudinal plan of care) . Evaluation of current treatment plan related to head concussion  w/o loss of consciousness and patient's adherence to plan as established by provider. . Provided education to patient re: what to expect with recovering from recent head concussion; Reviewed and discussed PCP post ED follow up and recommendations: Determined patient feels her symptoms of headache, fatigue and dizziness have improved some . Reviewed medications with patient and discussed  indication, dosage and frequency of prescribed antiemetic and antihistamine prescribed for symptom management; Encouraged patient to also try peppermint essential oils for nausea if desired . Discussed plans with patient for ongoing care management follow up and provided patient with direct contact information for care management team . Encouraged patient to notify the CCM team and or PCP of persistent or worsening symptoms suggestive of a change in her condition   Patient Self Care Activities:  . Patient verbalizes understanding of plan to contact primary care provider for worsening symptoms of concussion . Self administers medications as prescribed . Attends all scheduled provider appointments . Performs ADL's independently . Calls provider office for new concerns or questions  Please see past updates related to this goal by clicking on the "Past Updates" button in the selected goal        Other   . Assist with care coordination needs related to social determinants of health   Not on track    Current Barriers:  . Huntington's Disease impacting ability to perform iADL's . Limited social support . Lacks knowledge of local community resources to assist with transportation . Limited income resulting in food insecurity  Social Work Clinical Goal(s):  [name] Over the next 45 days the patient will become more knowledgeable of local food pantry's to assist with food insecurity . Over the next 45 days the patient will work with SW to become more knowledgeable of transportation resources Goal Met 07/17/2019 . Over the next 60 days the patient will work with SW to complete and submit a SCAT application Goal Met 10/30/6331 . New 08/07/19- Over the next 60 days the patient will work with SW to address concerns related to transferring Medicaid benefit to state of [place] in order to obtain access to a more suited health plan  CCM SW Interventions: Completed 09/19/19  . Outbound call placed to the patient to  determine progression of patient goal to switch Medicaid benefit to [place] . Determined the patient was informed the benefit would stop on 09/07/19 however the patient did receive a payment on 09/08/19 o The patient reports she contacted [place] who reports this should have been the last payment o Patient is awaiting a letter in the mail confirming benefit has been stopped . Advised the patient to contact SW as needed for assistance . Scheduled follow up call over the next month to confirm letter received and to assist with transferring Medicaid coverage to [place]  Patient Self Care Activities:  . Patient verbalizes understanding of plan to work with CM team to address care coordination needs . Self administers medications as prescribed . Attends all scheduled provider appointments . Calls pharmacy for medication refills  Please see past updates related to this goal by clicking on the "Past Updates" button in the selected goal          Follow Up Plan: SW will follow up with patient by phone over the next month.   Daneen Schick, BSW, CDP Social Worker, Certified Dementia Practitioner [place] [facility] [phone]  Total time spent performing care coordination and/or care management activities with the patient by phone or face  to face = 10 minutes.

## 2019-09-20 NOTE — Patient Instructions (Signed)
Social Worker Visit Information  Goals we discussed today:  Goals Addressed            This Visit's Progress     Patient Stated   . COMPLETED: "I have a concussion" (pt-stated)       CARE PLAN ENTRY (see longtitudinal plan of care for additional care plan information)  Current Barriers:  . Recent head trauma resulting in ED visit on 4/3 to perform CT scan . Ongoing headache and fatigue . Huntington's Disease  Social Work Clinical Goal(s):  [name] Over the next 7 days the patient will follow up with her primary care provider as instructed in ED after visit summary . Over the next 30 days the patient will report adverse concerns to her primary care team  CCM SW Interventions: Completed 09/19/19 . Successful outbound call placed to the patient to assess progression of patient goal . Determined the patient is no longer experiencing headache as a result of recent concussion . Advised the patient to report any reoccurring symptoms to her primary provider . Collaboration with RN Care Manager to inform patient is no longer symptomatic from previous concussion . Goal Met  CCM RN CM Interventions: 08/27/19 call completed with patient  [place] (see longitudinal plan of care for additional care plan information)  Current Barriers:  [name] Knowledge Deficits related to what to expect with disease process and recovery from head concussion  . Chronic Disease Management support and education needs related to Huntington's disease, Moderate episode of recurrent major depressive disorder   Interventions:  . Inter-disciplinary care team collaboration (see longitudinal plan of care) . Evaluation of current treatment plan related to head concussion w/o loss of consciousness and patient's adherence to plan as established by provider. . Provided education to patient re: what to expect with recovering from recent head concussion; Reviewed and discussed PCP post ED follow up and recommendations:  Determined patient feels her symptoms of headache, fatigue and dizziness have improved some . Reviewed medications with patient and discussed indication, dosage and frequency of prescribed antiemetic and antihistamine prescribed for symptom management; Encouraged patient to also try peppermint essential oils for nausea if desired . Discussed plans with patient for ongoing care management follow up and provided patient with direct contact information for care management team . Encouraged patient to notify the CCM team and or PCP of persistent or worsening symptoms suggestive of a change in her condition   Patient Self Care Activities:  . Patient verbalizes understanding of plan to contact primary care provider for worsening symptoms of concussion . Self administers medications as prescribed . Attends all scheduled provider appointments . Performs ADL's independently . Calls provider office for new concerns or questions  Please see past updates related to this goal by clicking on the "Past Updates" button in the selected goal        Other   . Assist with care coordination needs related to social determinants of health   Not on track    Current Barriers:  . Huntington's Disease impacting ability to perform iADL's . Limited social support . Lacks knowledge of local community resources to assist with transportation . Limited income resulting in food insecurity  Social Work Clinical Goal(s):  [name] Over the next 45 days the patient will become more knowledgeable of local food pantry's to assist with food insecurity . Over the next 45 days the patient will work with SW to become more knowledgeable of transportation resources Goal Met 07/17/2019 . Over the next  60 days the patient will work with SW to complete and submit a SCAT application Goal Met 5/67/0141 . New 08/07/19- Over the next 60 days the patient will work with SW to address concerns related to transferring Medicaid benefit to state of [place] in  order to obtain access to a more suited health plan  CCM SW Interventions: Completed 09/19/19  . Outbound call placed to the patient to determine progression of patient goal to switch Medicaid benefit to [place] . Determined the patient was informed the benefit would stop on 09/07/19 however the patient did receive a payment on 09/08/19 o The patient reports she contacted [place] who reports this should have been the last payment o Patient is awaiting a letter in the mail confirming benefit has been stopped . Advised the patient to contact SW as needed for assistance . Scheduled follow up call over the next month to confirm letter received and to assist with transferring Medicaid coverage to [place]  Patient Self Care Activities:  . Patient verbalizes understanding of plan to work with CM team to address care coordination needs . Self administers medications as prescribed . Attends all scheduled provider appointments . Calls pharmacy for medication refills  Please see past updates related to this goal by clicking on the "Past Updates" button in the selected goal         Follow Up Plan: SW will follow up with patient by phone over the next month   Daneen Schick, BSW, CDP Social Worker, Certified Dementia Practitioner [place] [facility] [place] Management [phone]

## 2019-09-27 ENCOUNTER — Telehealth: Payer: Self-pay

## 2019-09-27 ENCOUNTER — Other Ambulatory Visit: Payer: Self-pay

## 2019-09-27 ENCOUNTER — Ambulatory Visit: Payer: Self-pay

## 2019-09-27 DIAGNOSIS — F331 Major depressive disorder, recurrent, moderate: Secondary | ICD-10-CM

## 2019-09-27 DIAGNOSIS — G10 Huntington's disease: Secondary | ICD-10-CM

## 2019-09-27 DIAGNOSIS — F339 Major depressive disorder, recurrent, unspecified: Secondary | ICD-10-CM

## 2019-09-28 ENCOUNTER — Telehealth: Payer: Self-pay

## 2019-09-28 ENCOUNTER — Ambulatory Visit (INDEPENDENT_AMBULATORY_CARE_PROVIDER_SITE_OTHER): Payer: Medicare HMO

## 2019-09-28 ENCOUNTER — Other Ambulatory Visit: Payer: Self-pay

## 2019-09-28 DIAGNOSIS — F339 Major depressive disorder, recurrent, unspecified: Secondary | ICD-10-CM | POA: Diagnosis not present

## 2019-09-28 DIAGNOSIS — G10 Huntington's disease: Secondary | ICD-10-CM

## 2019-09-28 DIAGNOSIS — F331 Major depressive disorder, recurrent, moderate: Secondary | ICD-10-CM | POA: Diagnosis not present

## 2019-09-28 NOTE — Chronic Care Management (AMB) (Signed)
  Chronic Care Management   Follow Up Note   09/27/2019 Name: Katherine Gregory MRN: [id number] DOB: October 30, 1965  Referred by: Arnette Felts, FNP Reason for referral : Chronic Care Management ([facility] RNCM Call )   Katherine Gregory is a 54 y.o. year old female who is a primary care patient of Arnette Felts, FNP. The CCM team was consulted for assistance with chronic disease management and care coordination needs.    Review of patient status, including review of consultants reports, relevant laboratory and other test results, and collaboration with appropriate care team members and the patient's provider was performed as part of comprehensive patient evaluation and provision of chronic care management services.     Chart Review in preparation to contact patient for CCM RN CM update.   Outpatient Encounter Medications as of 09/27/2019  Medication Sig  . B Complex Vitamins (VITAMIN B COMPLEX PO) Take by mouth. Take 1 tablet daily  . Calcium Carb-Cholecalciferol (CALCIUM 1000 + D) 1000-800 MG-UNIT TABS Take by mouth. Take 1 tablet daily  . Cholecalciferol (VITAMIN D3) 50 MCG (2000 UT) TABS Take by mouth. Take 1 tablet daily  . DULoxetine (CYMBALTA) 30 MG capsule TAKE 2 CAPSULES BY MOUTH DAILY  . meclizine (ANTIVERT) 25 MG tablet Take 1 tablet (25 mg total) by mouth 3 (three) times daily as needed for dizziness.  . methocarbamol (ROBAXIN) 500 MG tablet Take 1 tablet (500 mg total) by mouth 2 (two) times daily.  . Multiple Vitamin (MULTIVITAMIN ADULT PO) Take by mouth. Take 1 tablet daily  . Multiple Vitamins-Minerals (AIRBORNE PO) Take 4 tablets by mouth daily.  . Multiple Vitamins-Minerals (ZINC PO) Take 60 mg by mouth daily.  [name] omeprazole (PRILOSEC) 40 MG capsule TAKE 1 CAPSULE BY MOUTH EVERY DAY  . ondansetron (ZOFRAN) 4 MG tablet Take 1 tablet (4 mg total) by mouth daily as needed for nausea or vomiting.  [name] tetrabenazine (XENAZINE) 12.5 MG tablet Take 1 tablet (12.5 mg total) by  mouth 2 (two) times daily.  . Wheat Dextrin (BENEFIBER PO) Take 1 tablet by mouth daily.   No facility-administered encounter medications on file as of 09/27/2019.    Objective:   Lab Results  Component Value Date   HGBA1C 5.4 05/29/2019   HGBA1C 5.9 04/28/2016   HGBA1C 5.6 11/16/2013   Lab Results  Component Value Date   LDLCALC 146 (H) 05/29/2019   CREATININE 1.11 (H) 05/29/2019   BP Readings from Last 3 Encounters:  09/20/19 (!) 139/98  08/20/19 128/80  08/11/19 (!) 137/98    Plan:   Telephone follow up appointment with care management team member scheduled for: 10/01/19  Delsa Sale, RN, BSN, CCM Care Management Coordinator [facility] Care Management/[facility]  Direct Phone: [phone]

## 2019-10-01 NOTE — Chronic Care Management (AMB) (Signed)
  Chronic Care Management   Outreach Note  10/01/2019 Name: Minha Fulco MRN: [id number] DOB: 1965/06/18  Referred by: Arnette Felts, FNP Reason for referral : Chronic Care Management ([facility] RN CM Call )   An unsuccessful telephone outreach was attempted today. The patient was referred to the case management team for assistance with care management and care coordination.   Follow Up Plan: A HIPPA compliant phone message was left for the patient providing contact information and requesting a return call.  Telephone follow up appointment with care management team member scheduled for: 11/01/19  Delsa Sale, RN, BSN, CCM Care Management Coordinator [facility] Care Management/[facility]  Direct Phone: [phone]

## 2019-10-05 ENCOUNTER — Ambulatory Visit: Payer: Self-pay

## 2019-10-05 DIAGNOSIS — F331 Major depressive disorder, recurrent, moderate: Secondary | ICD-10-CM

## 2019-10-05 DIAGNOSIS — G10 Huntington's disease: Secondary | ICD-10-CM

## 2019-10-05 NOTE — Chronic Care Management (AMB) (Signed)
  Chronic Care Management   Outreach Note  10/05/2019 Name: Katherine Gregory MRN: [id number] DOB: Dec 29, 1965  Referred by: Arnette Felts, FNP Reason for referral : Care Coordination   SW placed an unsuccessful outbound call to the patient to assist with care coordination needs and assess for goal progression. SW left a HIPAA compliant voice message requesting a return call.  Follow Up Plan: The care management team will reach out to the patient again over the next 14 days.   Bevelyn Ngo, BSW, CDP Social Worker, Certified Dementia Practitioner [facility] Care Management [phone]

## 2019-10-17 ENCOUNTER — Ambulatory Visit: Payer: Self-pay

## 2019-10-17 ENCOUNTER — Telehealth: Payer: Self-pay

## 2019-10-17 DIAGNOSIS — G10 Huntington's disease: Secondary | ICD-10-CM

## 2019-10-17 DIAGNOSIS — F331 Major depressive disorder, recurrent, moderate: Secondary | ICD-10-CM

## 2019-10-17 NOTE — Chronic Care Management (AMB) (Signed)
  Chronic Care Management   Outreach Note  10/17/2019 Name: Katherine Gregory MRN: [id number] DOB: 1966/01/30  Referred by: Arnette Felts, FNP Reason for referral : Care Coordination   SW placed a second unsuccessful outbound call to the patient to assist with care management and care coordination needs. SW left a HIPAA compliant voice message requesting a return call.  Follow Up Plan: The care management team will reach out to the patient again over the next 10 days.   Bevelyn Ngo, BSW, CDP Social Worker, Certified Dementia Practitioner [facility] Care Management [phone]

## 2019-10-23 ENCOUNTER — Telehealth: Payer: Self-pay

## 2019-10-23 ENCOUNTER — Ambulatory Visit: Payer: Self-pay

## 2019-10-23 NOTE — Chronic Care Management (AMB) (Signed)
  Chronic Care Management   Outreach Note  10/23/2019 Name: Katherine Gregory MRN: [id number] DOB: 1965-08-12  Referred by: Arnette Felts, FNP Reason for referral : Care Coordination   Third unsuccessful telephone outreach was attempted today. The patient was referred to the case management team for assistance with care management and care coordination. The patient's primary care provider has been notified of our unsuccessful attempts to make or maintain contact with the patient. SW is pleased to engage with this patient at any time in the future should she be interested in assistance from the care management team.   Follow Up Plan: No further SW follow up. Patient will remain active with RN Care Manager.  Bevelyn Ngo, BSW, CDP Social Worker, Certified Dementia Practitioner [facility] Care Management [phone]

## 2019-11-01 ENCOUNTER — Telehealth: Payer: Self-pay

## 2019-11-06 ENCOUNTER — Ambulatory Visit (INDEPENDENT_AMBULATORY_CARE_PROVIDER_SITE_OTHER): Payer: Medicare HMO | Admitting: Nurse Practitioner

## 2019-11-06 ENCOUNTER — Other Ambulatory Visit: Payer: Self-pay

## 2019-11-06 ENCOUNTER — Encounter: Payer: Self-pay | Admitting: Nurse Practitioner

## 2019-11-06 VITALS — BP 122/80 | HR 95 | Temp 98.2°F | Ht 68.0 in | Wt 207.8 lb

## 2019-11-06 DIAGNOSIS — G4700 Insomnia, unspecified: Secondary | ICD-10-CM

## 2019-11-06 DIAGNOSIS — G10 Huntington's disease: Secondary | ICD-10-CM | POA: Diagnosis not present

## 2019-11-06 DIAGNOSIS — R6889 Other general symptoms and signs: Secondary | ICD-10-CM | POA: Diagnosis not present

## 2019-11-06 DIAGNOSIS — E7800 Pure hypercholesterolemia, unspecified: Secondary | ICD-10-CM | POA: Diagnosis not present

## 2019-11-06 NOTE — Patient Instructions (Signed)
   Red yeast rice supplements for cholesterol.

## 2019-11-06 NOTE — Progress Notes (Signed)
This visit occurred during the SARS-CoV-2 public health emergency.  Safety protocols were in place, including screening questions prior to the visit, additional usage of staff PPE, and extensive cleaning of exam room while observing appropriate contact time as indicated for disinfecting solutions.  Subjective:     Patient ID: Katherine Gregory , female    DOB: January 31, 1966 , 54 y.o.   MRN: [id number]   Chief Complaint  Patient presents with  . Hyperlipidemia    HPI  Here for cholesterol recheck.  She is using benefiber as a substitute.  She is exercising a little, feels like she is getting better.   Reports she continues to have fatigue and forgetfulness.  She has not tried taking melatonin as I had recommended at her last visit due to her insomnia. Insomnia Primary symptoms: no sleep disturbance, difficulty falling asleep, no frequent awakening.  The current episode started more than one year. The onset quality is gradual. The problem occurs every several days. The problem is unchanged. Relieved by: has not tried anything. Typical bedtime:  10-11 P.M..  PMH includes: no hypertension.     Past Medical History:  Diagnosis Date  . Acid reflux   . Bleeding gastric ulcer   . Cellulitis   . Cervical back pain with evidence of disc disease   . Depression   . Dysuria   . FATIGUE   . Fibromyalgia   . FLANK PAIN, RIGHT   . GOITER   . HEADACHES, HX OF   . Huntington's disease (HCC)   . IBS (irritable bowel syndrome)   . Palpitations   . Post partum depression   . Seasonal allergies   . URTICARIA   . Vertigo   . Vitamin D deficiency      Family History  Problem Relation Age of Onset  . Hypertension Mother   . Huntington's disease Mother        deceased Aug 11, 2022  . Diabetes Father   . Hypertension Father   . Heart disease Father   . Scoliosis Sister   . Huntington's disease Sister   . Diabetes Brother   . Hypertension Brother   . Scoliosis Brother   . Skin cancer  Maternal Grandmother      Current Outpatient Medications:  .  B Complex Vitamins (VITAMIN B COMPLEX PO), Take by mouth. Take 1 tablet daily, Disp: , Rfl:  .  Calcium Carb-Cholecalciferol (CALCIUM 1000 + D) 1000-800 MG-UNIT TABS, Take by mouth. Take 1 tablet daily, Disp: , Rfl:  .  Cholecalciferol (VITAMIN D3) 50 MCG (2000 UT) TABS, Take by mouth. Take 1 tablet daily, Disp: , Rfl:  .  DULoxetine (CYMBALTA) 30 MG capsule, TAKE 2 CAPSULES BY MOUTH DAILY, Disp: 60 capsule, Rfl: 2 .  Multiple Vitamin (MULTIVITAMIN ADULT PO), Take by mouth. Take 1 tablet daily, Disp: , Rfl:  .  Multiple Vitamins-Minerals (ZINC PO), Take 60 mg by mouth daily., Disp: , Rfl:  .  tetrabenazine (XENAZINE) 12.5 MG tablet, Take 1 tablet (12.5 mg total) by mouth 2 (two) times daily., Disp: 60 tablet, Rfl: 11 .  Wheat Dextrin (BENEFIBER PO), Take 1 tablet by mouth daily., Disp: , Rfl:    Allergies  Allergen Reactions  . Ibuprofen Other (See Comments)    "It intensifies my pain." Made pain and symptoms worse. "It intensifies my pain."  . Morphine Itching    Red blotches on skin and burning.  . Morphine And Related Itching and Other (See Comments)    Reaction burns Reaction  burns  . Penicillin G Itching  . Penicillins Hives and Itching    Has patient had a PCN reaction causing immediate rash, facial/tongue/throat swelling, SOB or lightheadedness with hypotension: Yes Has patient had a PCN reaction causing severe rash involving mucus membranes or skin necrosis: No Has patient had a PCN reaction that required hospitalization No Has patient had a PCN reaction occurring within the last 10 years: No If all of the above answers are "NO", then may proceed with Cephalosporin use.   [name] Risperdal [Risperidone] Other (See Comments)    Confusion, oversedation   . Adhesive [Tape] Itching    Itching and burning at site Itching and burning at site  . Pseudoephedrine Other (See Comments) and Palpitations    Speed up heart  rate Speed up heart rate  . Vitamin [Cyanocobalamin] Hives and Rash     Review of Systems  Constitutional: Positive for fatigue.  Eyes: Negative.   Respiratory: Negative.  Negative for cough.   Cardiovascular: Negative.  Negative for chest pain, palpitations and leg swelling.  Endocrine: Negative for polydipsia, polyphagia and polyuria.  Genitourinary: Negative.   Skin: Negative.   Neurological: Negative for dizziness.  Psychiatric/Behavioral: Negative for sleep disturbance. The patient has insomnia.      Today's Vitals   11/06/19 1014  BP: 122/80  Pulse: 95  Temp: 98.2 F (36.8 C)  TempSrc: Oral  Weight: 207 lb 12.8 oz (94.3 kg)  Height: 5\' 8"  (1.727 m)  PainSc: 0-No pain   Body mass index is 31.6 kg/m.   Objective:  Physical Exam Constitutional:      General: She is not in acute distress.    Appearance: Normal appearance.  Cardiovascular:     Rate and Rhythm: Normal rate and regular rhythm.     Pulses: Normal pulses.     Heart sounds: Normal heart sounds. No murmur heard.   Pulmonary:     Effort: Pulmonary effort is normal. No respiratory distress.     Breath sounds: Normal breath sounds.  Skin:    Capillary Refill: Capillary refill takes less than 2 seconds.  Neurological:     General: No focal deficit present.     Mental Status: She is alert and oriented to person, place, and time.     Cranial Nerves: No cranial nerve deficit.  Psychiatric:        Mood and Affect: Mood normal.        Behavior: Behavior normal.        Thought Content: Thought content normal.        Judgment: Judgment normal.         Assessment And Plan:     1. Elevated cholesterol  No current medications  Discussed with her if lipid panel remains elevated will need to start a medication  Discussed the pathophysiology of elevated cholesterol and the risk for atherosclerotic disease.  Also discussed taking red yeast rice and continuing with benefiber  2. Huntington's disease  [facility])  Being followed by [facility], next visit in August  3. Forgetfulness  Not related to her vitamin B12 or other metabolic causes.  I did discuss if she is not getting good sleep this can cause these symptoms  She is to discuss with Dr. 08-26-1984 to see if this is related to her disease process of Huntington's dz   [name], FNP    THE PATIENT IS ENCOURAGED TO PRACTICE SOCIAL DISTANCING DUE TO THE COVID-19 PANDEMIC.

## 2019-11-07 LAB — LIPID PANEL
Chol/HDL Ratio: 3.8 ratio (ref 0.0–4.4)
Cholesterol, Total: 264 mg/dL — ABNORMAL HIGH (ref 100–199)
HDL: 70 mg/dL (ref >39)
LDL Chol Calc (NIH): 172 mg/dL — ABNORMAL HIGH (ref 0–99)
Triglycerides: 124 mg/dL (ref 0–149)
VLDL Cholesterol Cal: 22 mg/dL (ref 5–40)

## 2019-11-08 ENCOUNTER — Telehealth: Payer: Self-pay | Admitting: *Deleted

## 2019-11-08 NOTE — Telephone Encounter (Signed)
Pt called because her PCP told her to f/u with our office about issues with memory/fatigue. Wanting to know if this could be related to her Huntington's disease. She has not been to see psychiatry yet, she had an appt but was unable to make it. She will work on getting this rescheduled as well.

## 2019-11-08 NOTE — Telephone Encounter (Signed)
She has noticed some slow changes in her memory and increased fatigue. She has an follow up here on 12/19/19 and would like to further discuss theses symptoms at that appt.

## 2019-11-21 ENCOUNTER — Other Ambulatory Visit: Payer: Self-pay | Admitting: Nurse Practitioner

## 2019-11-21 ENCOUNTER — Telehealth: Payer: Self-pay

## 2019-11-21 DIAGNOSIS — E782 Mixed hyperlipidemia: Secondary | ICD-10-CM

## 2019-11-21 MED ORDER — ATORVASTATIN CALCIUM 20 MG PO TABS: ORAL_TABLET | ORAL | 1 refills | Status: DC

## 2019-11-21 NOTE — Telephone Encounter (Signed)
Pt LVM stating she had a migraine, felt nauseas, and vomited one time yesterday. Spoke w/pt she stated she was feeling a little better she had taken some Excedrin.

## 2019-11-29 ENCOUNTER — Encounter (HOSPITAL_COMMUNITY): Payer: Self-pay

## 2019-11-29 ENCOUNTER — Emergency Department (HOSPITAL_COMMUNITY)
Admission: EM | Admit: 2019-11-29 | Discharge: 2019-11-30 | Disposition: A | Discharge status: Home / Self Care | Payer: Medicare HMO | Attending: Emergency Medicine | Admitting: Emergency Medicine

## 2019-11-29 DIAGNOSIS — R21 Rash and other nonspecific skin eruption: Secondary | ICD-10-CM | POA: Insufficient documentation

## 2019-11-29 DIAGNOSIS — R519 Headache, unspecified: Secondary | ICD-10-CM | POA: Diagnosis not present

## 2019-11-29 DIAGNOSIS — R5383 Other fatigue: Secondary | ICD-10-CM | POA: Diagnosis not present

## 2019-11-29 LAB — CBC
HCT: 42.2 % (ref 36.0–46.0)
Hemoglobin: 13.7 g/dL (ref 12.0–15.0)
MCH: 29.1 pg (ref 26.0–34.0)
MCHC: 32.5 g/dL (ref 30.0–36.0)
MCV: 89.8 fL (ref 80.0–100.0)
Platelets: 380 10*3/uL (ref 150–400)
RBC: 4.7 MIL/uL (ref 3.87–5.11)
RDW: 11.9 % (ref 11.5–15.5)
WBC: 7.6 10*3/uL (ref 4.0–10.5)
nRBC: 0 % (ref 0.0–0.2)

## 2019-11-29 LAB — BASIC METABOLIC PANEL
Anion gap: 11 (ref 5–15)
BUN: 9 mg/dL (ref 6–20)
CO2: 25 mmol/L (ref 22–32)
Calcium: 9.5 mg/dL (ref 8.9–10.3)
Chloride: 104 mmol/L (ref 98–111)
Creatinine, Ser: 0.86 mg/dL (ref 0.44–1.00)
GFR calc Af Amer: >60 mL/min (ref >60)
GFR calc non Af Amer: >60 mL/min (ref >60)
Glucose, Bld: 92 mg/dL (ref 70–99)
Potassium: 4.1 mmol/L (ref 3.5–5.1)
Sodium: 140 mmol/L (ref 135–145)

## 2019-11-29 NOTE — ED Triage Notes (Signed)
Pt arrives to ED w/ c/o generalized fatigue, intermittent headache, intermittent abdominal pain x 3 weeks. Pt denies cough, sob, fever. Pt states she received covid vaccines.

## 2019-11-30 ENCOUNTER — Other Ambulatory Visit: Payer: Self-pay

## 2019-11-30 MED ORDER — ACETAMINOPHEN 325 MG PO TABS
650.0000 mg | ORAL_TABLET | Freq: Once | ORAL | Status: AC
  Administered 2019-11-30: 650 mg via ORAL
  Filled 2019-11-30: qty 2

## 2019-11-30 NOTE — ED Notes (Signed)
Pt reports new chest pain, EKG completed

## 2019-11-30 NOTE — ED Provider Notes (Signed)
[facility] EMERGENCY DEPARTMENT Provider Note   CSN: [id number] Arrival date & time: 11/29/19  2048     History Chief Complaint  Patient presents with  . Fatigue    Katherine Gregory is a 54 y.o. female.  HPI She presents for evaluation of fatigue, difficulty sleeping, general unease, and headaches for several weeks.  Symptoms wax and wane.  She denies fever, cough, shortness of breath, focal weakness or paresthesia.  She is concerned about a white spot on her buttocks that she noticed 2 days ago.  She thinks she had a rash there a couple months ago.  She is taking her usual medicines.  She denies significant stressors in her life and has no depressive symptoms.  There are no other known modifying factors.    Past Medical History:  Diagnosis Date  . Acid reflux   . Bleeding gastric ulcer   . Cellulitis   . Cervical back pain with evidence of disc disease   . Depression   . Dysuria   . FATIGUE   . Fibromyalgia   . FLANK PAIN, RIGHT   . GOITER   . HEADACHES, HX OF   . Huntington's disease (HCC)   . IBS (irritable bowel syndrome)   . Palpitations   . Post partum depression   . Seasonal allergies   . URTICARIA   . Vertigo   . Vitamin D deficiency     Patient Active Problem List   Diagnosis Date Noted  . Gait abnormality 06/19/2019  . Polypharmacy 06/17/2016  . BMI 30.0-30.9,adult 05/14/2016  . Benign paroxysmal positional vertigo 04/28/2016  . Insomnia 04/28/2016  . Depression, major, recurrent (HCC) 04/28/2016  . Vitamin D deficiency 04/28/2016  . Polyp at cervical os 01/24/2015  . Huntington's disease (HCC) 06/20/2014  . Abnormal uterine bleeding 11/16/2013  . Bleeding gastric ulcer   . Cervical back pain with evidence of disc disease   . Chest pain 05/16/2012  . Dizziness 05/16/2012  . Abdominal pain 05/16/2012  . Atypical chest pain 04/21/2012  . Hot flashes 03/10/2012  . Fibromyalgia 03/10/2012  . Overweight(278.02) 01/24/2012    . Borderline systolic HTN 11/25/2011  . FLANK PAIN, RIGHT 10/26/2007  . Fatigue 10/19/2007  . DYSURIA 10/19/2007  . ALLERGIC RHINITIS 08/17/2007  . GOITER 07/25/2007  . GERD 07/25/2007  . PALPITATIONS 07/25/2007  . HEADACHES, HX OF 07/25/2007    Past Surgical History:  Procedure Laterality Date  . CARDIAC CATHETERIZATION     05/17/12 / waiting on results  . CARTILAGE SURGERY     RT wrist  . CESAREAN SECTION  1988 & 2008   x 2  . CHOLECYSTECTOMY    . DILITATION & CURRETTAGE/HYSTROSCOPY WITH HYDROTHERMAL ABLATION N/A 01/24/2015   Procedure: DILATATION & CURETTAGE/HYSTEROSCOPY WITH HYDROTHERMAL ABLATION;  Surgeon: Brock Bad, MD;  Location: [facility];  Service: Gynecology;  Laterality: N/A;  Ethelene Browns will be here  . WISDOM TOOTH EXTRACTION       OB History    Gravida  4   Para  2   Term  2   Preterm      AB  2   Living  2     SAB  1   TAB  1   Ectopic      Multiple      Live Births  2           Family History  Problem Relation Age of Onset  . Hypertension Mother   . Huntington's disease  Mother        deceased 3/15  . Diabetes Father   . Hypertension Father   . Heart disease Father   . Scoliosis Sister   . Huntington's disease Sister   . Diabetes Brother   . Hypertension Brother   . Scoliosis Brother   . Skin cancer Maternal Grandmother     Social History   Tobacco Use  . Smoking status: Never Smoker  . Smokeless tobacco: Never Used  Substance Use Topics  . Alcohol use: Not Currently    Comment: seldom  . Drug use: No    Home Medications Prior to Admission medications   Medication Sig Start Date End Date Taking? Authorizing Provider  atorvastatin (LIPITOR) 20 MG tablet Take 1 tablet by mouth MWF Patient taking differently: Take 20 mg by mouth every Monday, Wednesday, and Friday.  11/21/19  Yes Arnette FeltsMoore, Janece, FNP  B Complex Vitamins (VITAMIN B COMPLEX PO) Take 1 tablet by mouth daily.    Yes [provider]  Calcium  Carb-Cholecalciferol (CALCIUM 1000 + D) 1000-800 MG-UNIT TABS Take 1 tablet by mouth daily.    Yes [provider]  Cholecalciferol (VITAMIN D3) 50 MCG (2000 UT) TABS Take 2,000 Units by mouth daily.    Yes [provider]  DULoxetine (CYMBALTA) 30 MG capsule TAKE 2 CAPSULES BY MOUTH DAILY Patient taking differently: Take 60 mg by mouth daily.  09/16/19  Yes Arnette FeltsMoore, Janece, FNP  melatonin 5 MG TABS Take 5 mg by mouth at bedtime as needed (sleep aid).   Yes [provider]  Multiple Vitamin (MULTIVITAMIN ADULT PO) Take 1 tablet by mouth daily.    Yes [provider]  Multiple Vitamins-Minerals (ZINC PO) Take 50 mg by mouth daily.    Yes [provider]  omeprazole (PRILOSEC) 40 MG capsule Take 40 mg by mouth daily.   Yes [provider]  tetrabenazine (XENAZINE) 12.5 MG tablet Take 1 tablet (12.5 mg total) by mouth 2 (two) times daily. 06/19/19  Yes Levert FeinsteinYan, Yijun, MD  Wheat Dextrin (BENEFIBER PO) Take 1 tablet by mouth daily.   Yes [provider]    Allergies    Ibuprofen, Morphine, Morphine and related, Penicillin g, Penicillins, Risperdal [risperidone], Adhesive [tape], Pseudoephedrine, and Vitamin [cyanocobalamin]  Review of Systems   Review of Systems  All other systems reviewed and are negative.   Physical Exam Updated Vital Signs BP (!) 135/97 (BP Location: Right Arm)   Pulse 83   Temp 98.8 F (37.1 C) (Oral)   Resp 14   Ht 5\' 10"  (1.778 m)   Wt (!) 93.9 kg   SpO2 99%   BMI 29.70 kg/m   Physical Exam Vitals and nursing note reviewed.  Constitutional:      General: She is not in acute distress.    Appearance: She is well-developed. She is not ill-appearing, toxic-appearing or diaphoretic.  HENT:     Head: Normocephalic and atraumatic.     Right Ear: External ear normal.     Left Ear: External ear normal.  Eyes:     Conjunctiva/sclera: Conjunctivae normal.     Pupils: Pupils are equal, round, and reactive to light.   Neck:     Trachea: Phonation normal.  Cardiovascular:     Rate and Rhythm: Normal rate and regular rhythm.  Pulmonary:     Effort: Pulmonary effort is normal.  Abdominal:     General: There is no distension.  Musculoskeletal:        General:  Normal range of motion.     Cervical back: Normal range of motion and neck supple.  Skin:    General: Skin is warm and dry.     Comments: Left lateral buttocks with irregular shaped 2 x 5 cm defect indented area, without associated erythema, induration, or other palpable or visible deformities.  Neurological:     Mental Status: She is alert and oriented to person, place, and time.     Cranial Nerves: No cranial nerve deficit.     Sensory: No sensory deficit.     Motor: No abnormal muscle tone.     Coordination: Coordination normal.  Psychiatric:        Mood and Affect: Mood normal.        Behavior: Behavior normal.        Thought Content: Thought content normal.        Judgment: Judgment normal.     ED Results / Procedures / Treatments   Labs (all labs ordered are listed, but only abnormal results are displayed) Labs Reviewed  CBC  BASIC METABOLIC PANEL    EKG EKG Interpretation  Date/Time:  Friday November 30 2019 00:42:59 EDT Ventricular Rate:  91 PR Interval:  128 QRS Duration: 84 QT Interval:  362 QTC Calculation: 445 R Axis:   42 Text Interpretation: Normal sinus rhythm T wave abnormality, consider anterolateral ischemia Abnormal ECG since last tracing no significant change Confirmed by Mancel Bale [phone]) on 11/30/2019 8:24:23 AM   Radiology No results found.  Procedures Procedures (including critical care time)  Medications Ordered in ED Medications  acetaminophen (TYLENOL) tablet 650 mg (650 mg Oral Given 11/30/19 1937)    ED Course  I have reviewed the triage vital signs and the nursing notes.  Pertinent labs & imaging results that were available during my care of the patient were reviewed by me and  considered in my medical decision making (see chart for details).    MDM Rules/Calculators/A&P                           Patient Vitals for the past 24 hrs:  BP Temp Temp src Pulse Resp SpO2 Height Weight  11/30/19 0802 (!) 135/97 -- -- 83 14 99 % -- --  11/30/19 0800 (!) 135/97 -- -- 76 18 98 % -- --  11/30/19 0609 (!) 131/78 -- -- 83 18 99 % -- --  11/30/19 0055 117/85 -- -- 84 18 98 % -- --  11/29/19 2103 -- -- -- -- -- -- 5\' 10"  (1.778 m) (!) 93.9 kg  11/29/19 2102 (!) 157/95 98.8 F (37.1 C) Oral 71 18 98 % -- --    8:46 AM Reevaluation with update and discussion. After initial assessment and treatment, an updated evaluation reveals no change in clinical status.  Findings discussed and questions answered. 2103   Medical Decision Making:  This patient is presenting for evaluation of malaise, which does not require a range of treatment options, and is not a complaint that involves a high risk of morbidity and mortality. The differential diagnoses include depression, stress, indolent illness. I decided to review old records, and in summary middle-aged female with chronic illnesses and subacute complaints, which are nonspecific.  I did not require additional historical information from anyone.  Clinical Laboratory Tests Ordered, included CBC and Metabolic panel. Review indicates normal.   Critical Interventions-clinical evaluation, laboratory testing, observation reassessment  After These Interventions, the Patient was reevaluated and  was found stable for discharge.  Nonspecific malaise.  No evidence for acute infectious, metabolic or psychiatric syndromes.  She had transient hypotension on arrival that improved spontaneously.  Doubt hypertensive urgency.  CRITICAL CARE-no Performed by: Mancel Bale  Nursing Notes Reviewed/ Care Coordinated Applicable Imaging Reviewed Interpretation of Laboratory Data incorporated into ED treatment  The patient appears reasonably  screened and/or stabilized for discharge and I doubt any other medical condition or other [facility] requiring further screening, evaluation, or treatment in the ED at this time prior to discharge.  Plan: Home Medications-continue usual; Home Treatments-low-salt diet; return here if the recommended treatment, does not improve the symptoms; Recommended follow up-PCP for checkup next week.     Final Clinical Impression(s) / ED Diagnoses Final diagnoses:  Fatigue, unspecified type    Rx / DC Orders ED Discharge Orders    None       Mancel Bale, MD 11/30/19 [phone]

## 2019-11-30 NOTE — Discharge Instructions (Addendum)
The testing we did today is normal.  Your blood pressure was high when you came in but it came down to normal, on its own.  To help keep your blood pressure low, try to decrease your salt intake.  Salt is in many foods.  Do not add salt to your food.  See the attached information to help you avoid salt.  To explain the fatigue, we checked your blood counts and they were normal.  Continue to work on trying to get plenty of sleep and drink a lot of fluids.  It is important to get some exercise daily.  Make sure your stress level stays under control.  Follow-up with your doctor next week for the blood pressure check and to discuss how you are feeling.

## 2019-11-30 NOTE — ED Notes (Signed)
Patient no longer wanted to wait to be seen, waiting for ride.

## 2019-11-30 NOTE — ED Notes (Signed)
Pt concerned about "white patch" that developed on left buttocks. Denies pain at site.

## 2019-12-13 ENCOUNTER — Ambulatory Visit: Payer: Self-pay | Admitting: Nurse Practitioner

## 2019-12-14 ENCOUNTER — Ambulatory Visit: Payer: Self-pay

## 2019-12-14 ENCOUNTER — Other Ambulatory Visit: Payer: Self-pay

## 2019-12-14 ENCOUNTER — Telehealth: Payer: Medicare HMO

## 2019-12-14 DIAGNOSIS — G10 Huntington's disease: Secondary | ICD-10-CM

## 2019-12-14 DIAGNOSIS — E782 Mixed hyperlipidemia: Secondary | ICD-10-CM

## 2019-12-14 DIAGNOSIS — F339 Major depressive disorder, recurrent, unspecified: Secondary | ICD-10-CM

## 2019-12-18 NOTE — Patient Instructions (Signed)
Visit Information  Goals Addressed      Patient Stated   .  "to get over this respiratory virus" (pt-stated)        CARE PLAN ENTRY (see longitudinal plan of care for additional care plan information)  Current Barriers:  [name] Knowledge Deficits related to evaluation and treatment related to upper respiratory illness  . Chronic Disease Management support and education needs related to Huntington's disease, Moderate episode of recurrent major depressive disorder, Mixed hyperlipidemia   Nurse Case Manager Clinical Goal(s):  [name] Over the next 7 days, patient will verbalize understanding of plan for evaluation and treatment of upper respiratory illness   CCM RN CM Interventions:  12/14/19 call completed with patient  . Inter-disciplinary care team collaboration (see longitudinal plan of care) . Evaluation of current treatment plan related to upper Respiratory illness and patient's adherence to plan as established by provider . Determined patient is experiencing cough, congestion and laryngitis; patient reports being exposed to an upper respiratory virus while visiting with her young niece, patient reports her daughter who lives with her is also coming down with symptoms; patient states the virus is not COVID, she declines to f/u with the PCP or to seek COVID testing or to seek other medical attention . Advised patient to continue to rest, drink plenty of fluids and monitor for fever, advised patient to go to the ED and or call PCP office for an appointment if her symptoms worsen or persist . Discussed plans with patient for ongoing care management follow up and provided patient with direct contact information for care management team  Patient Self Care Activities:  . Self administers medications as prescribed . Attends all scheduled provider appointments . Calls pharmacy for medication refills . Calls provider office for new concerns or questions  Initial goal documentation       Patient  verbalizes understanding of instructions provided today.   Telephone follow up appointment with care management team member scheduled for: 12/19/19  Delsa Sale, RN, BSN, CCM Care Management Coordinator [facility] Care Management/[facility]  Direct Phone: [phone]

## 2019-12-18 NOTE — Chronic Care Management (AMB) (Signed)
Chronic Care Management   Follow Up Note   12/14/2019 Name: Katherine Gregory MRN: [id number] DOB: [birth year]  Referred by: Arnette Felts, FNP Reason for referral : Chronic Care Management (FU RN CM Call )   Katherine Gregory is a 54 y.o. year old female who is a primary care patient of Arnette Felts, FNP. The CCM team was consulted for assistance with chronic disease management and care coordination needs.    Review of patient status, including review of consultants reports, relevant laboratory and other test results, and collaboration with appropriate care team members and the patient's provider was performed as part of comprehensive patient evaluation and provision of chronic care management services.    SDOH (Social Determinants of Health) assessments performed: No See Care Plan activities for detailed interventions related to SDOH)   Placed outbound call to patient for a care plan follow up. Unable to address related goals due to patient is experiencing an acute respiratory illness and kept the call brief.     Outpatient Encounter Medications as of 12/14/2019  Medication Sig  . atorvastatin (LIPITOR) 20 MG tablet Take 1 tablet by mouth MWF (Patient taking differently: Take 20 mg by mouth every Monday, Wednesday, and Friday. )  . B Complex Vitamins (VITAMIN B COMPLEX PO) Take 1 tablet by mouth daily.   . Calcium Carb-Cholecalciferol (CALCIUM 1000 + D) 1000-800 MG-UNIT TABS Take 1 tablet by mouth daily.   . Cholecalciferol (VITAMIN D3) 50 MCG (2000 UT) TABS Take 2,000 Units by mouth daily.   . DULoxetine (CYMBALTA) 30 MG capsule TAKE 2 CAPSULES BY MOUTH DAILY (Patient taking differently: Take 60 mg by mouth daily. )  . melatonin 5 MG TABS Take 5 mg by mouth at bedtime as needed (sleep aid).  . Multiple Vitamin (MULTIVITAMIN ADULT PO) Take 1 tablet by mouth daily.   . Multiple Vitamins-Minerals (ZINC PO) Take 50 mg by mouth daily.   [name] omeprazole (PRILOSEC) 40 MG capsule  Take 40 mg by mouth daily.  [name] tetrabenazine (XENAZINE) 12.5 MG tablet Take 1 tablet (12.5 mg total) by mouth 2 (two) times daily.  . Wheat Dextrin (BENEFIBER PO) Take 1 tablet by mouth daily.   No facility-administered encounter medications on file as of 12/14/2019.     Objective:  Lab Results  Component Value Date   HGBA1C 5.4 05/29/2019   HGBA1C 5.9 04/28/2016   HGBA1C 5.6 11/16/2013   Lab Results  Component Value Date   LDLCALC 172 (H) 11/06/2019   CREATININE 0.86 11/29/2019   BP Readings from Last 3 Encounters:  11/30/19 (!) 136/96  11/06/19 122/80  09/20/19 (!) 139/98    Goals Addressed      Patient Stated   .  "to get over this respiratory virus" (pt-stated)        CARE PLAN ENTRY (see longitudinal plan of care for additional care plan information)  Current Barriers:  [name] Knowledge Deficits related to evaluation and treatment related to upper respiratory illness  . Chronic Disease Management support and education needs related to Huntington's disease, Moderate episode of recurrent major depressive disorder, Mixed hyperlipidemia   Nurse Case Manager Clinical Goal(s):  [name] Over the next 7 days, patient will verbalize understanding of plan for evaluation and treatment of upper respiratory illness   CCM RN CM Interventions:  12/14/19 call completed with patient  . Inter-disciplinary care team collaboration (see longitudinal plan of care) . Evaluation of current treatment plan related to upper Respiratory illness and patient's adherence to plan as  established by provider . Determined patient is experiencing cough, congestion and laryngitis; patient reports being exposed to an upper respiratory virus while visiting with her young niece, patient reports her daughter who lives with her is also coming down with symptoms; patient states the virus is not COVID, she declines to f/u with the PCP or to seek COVID testing or to seek other medical attention . Advised patient to continue  to rest, drink plenty of fluids and monitor for fever, advised patient to go to the ED and or call PCP office for an appointment if her symptoms worsen or persist . Discussed plans with patient for ongoing care management follow up and provided patient with direct contact information for care management team  Patient Self Care Activities:  . Self administers medications as prescribed . Attends all scheduled provider appointments . Calls pharmacy for medication refills . Calls provider office for new concerns or questions  Initial goal documentation      Plan:   Telephone follow up appointment with care management team member scheduled for: 12/19/19  Delsa Sale, RN, BSN, CCM Care Management Coordinator [facility] Care Management/[facility]  Direct Phone: [phone]

## 2019-12-19 ENCOUNTER — Ambulatory Visit: Payer: Medicare HMO | Admitting: Neurology

## 2019-12-19 ENCOUNTER — Telehealth: Payer: Self-pay

## 2019-12-19 ENCOUNTER — Telehealth: Payer: Medicare HMO

## 2019-12-19 NOTE — Telephone Encounter (Signed)
Patient consented to a virtual appointment. YL,RMA 

## 2019-12-20 ENCOUNTER — Other Ambulatory Visit: Payer: Self-pay

## 2019-12-20 ENCOUNTER — Encounter: Payer: Self-pay | Admitting: Nurse Practitioner

## 2019-12-20 ENCOUNTER — Telehealth (INDEPENDENT_AMBULATORY_CARE_PROVIDER_SITE_OTHER): Payer: Medicare HMO | Admitting: Nurse Practitioner

## 2019-12-20 DIAGNOSIS — J069 Acute upper respiratory infection, unspecified: Secondary | ICD-10-CM | POA: Diagnosis not present

## 2019-12-20 DIAGNOSIS — R0981 Nasal congestion: Secondary | ICD-10-CM | POA: Diagnosis not present

## 2019-12-20 MED ORDER — MOMETASONE FUROATE 50 MCG/ACT NA SUSP
2.0000 | Freq: Every day | NASAL | 2 refills | Status: DC
Start: 2019-12-20 — End: 2020-04-21

## 2019-12-20 MED ORDER — DOXYCYCLINE MONOHYDRATE 100 MG PO CAPS: 100.0000 mg | ORAL_CAPSULE | Freq: Two times a day (BID) | ORAL | 0 refills | Status: DC

## 2019-12-20 NOTE — Progress Notes (Signed)
Virtual Visit via My Chart   This visit type was conducted due to national recommendations for restrictions regarding the COVID-19 Pandemic (e.g. social distancing) in an effort to limit this patient's exposure and mitigate transmission in our community.  Due to her co-morbid illnesses, this patient is at least at moderate risk for complications without adequate follow up.  This format is felt to be most appropriate for this patient at this time.  All issues noted in this document were discussed and addressed.  A limited physical exam was performed with this format.    This visit type was conducted due to national recommendations for restrictions regarding the COVID-19 Pandemic (e.g. social distancing) in an effort to limit this patient's exposure and mitigate transmission in our community.  Patients identity confirmed using two different identifiers.  This format is felt to be most appropriate for this patient at this time.  All issues noted in this document were discussed and addressed.  No physical exam was performed (except for noted visual exam findings with Video Visits).    Date:  01/06/2020   ID:  Katherine Gregory, [place] 11-Feb-1966, MRN [id number]  Patient Location:  [facility]  Provider location:   Office    Chief Complaint:  Nasal congestion and cold symptoms  History of Present Illness:    Katherine Gregory is a 54 y.o. female who presents via video conferencing for a telehealth visit today.    The patient does have symptoms concerning for COVID-19 infection (fever, chills, cough, or new shortness of breath).   She now has laryngitis and a runny nose. She is unable to take over the counter medications - she is allergic to sudaphedrine - fast heart rate.   URI  This is a new problem. The current episode started 1 to 4 weeks ago (2 weeks ago). The problem has been gradually improving. Maximum temperature: chills. Associated symptoms include coughing (white  to a little hint of yellow and one time was green), rhinorrhea and a sore throat. Pertinent negatives include no chest pain or congestion. Associated symptoms comments: hoarse. She has tried acetaminophen (excedrin migraine ) for the symptoms.     Past Medical History:  Diagnosis Date  . Acid reflux   . Bleeding gastric ulcer   . Cellulitis   . Cervical back pain with evidence of disc disease   . Depression   . Dysuria   . FATIGUE   . Fibromyalgia   . FLANK PAIN, RIGHT   . GOITER   . HEADACHES, HX OF   . Huntington's disease (HCC)   . IBS (irritable bowel syndrome)   . Palpitations   . Post partum depression   . Seasonal allergies   . URTICARIA   . Vertigo   . Vitamin D deficiency    Past Surgical History:  Procedure Laterality Date  . CARDIAC CATHETERIZATION     05/17/12 / waiting on results  . CARTILAGE SURGERY     RT wrist  . CESAREAN SECTION  1988 & 2008   x 2  . CHOLECYSTECTOMY    . DILITATION & CURRETTAGE/HYSTROSCOPY WITH HYDROTHERMAL ABLATION N/A 01/24/2015   Procedure: DILATATION & CURETTAGE/HYSTEROSCOPY WITH HYDROTHERMAL ABLATION;  Surgeon: Brock Bad, MD;  Location: [facility];  Service: Gynecology;  Laterality: N/A;  Ethelene Browns will be here  . WISDOM TOOTH EXTRACTION       Current Meds  Medication Sig  . atorvastatin (LIPITOR) 20 MG tablet Take 1 tablet by mouth MWF (Patient  taking differently: Take 20 mg by mouth every Monday, Wednesday, and Friday. )  . B Complex Vitamins (VITAMIN B COMPLEX PO) Take 1 tablet by mouth daily.   . Calcium Carb-Cholecalciferol (CALCIUM 1000 + D) 1000-800 MG-UNIT TABS Take 1 tablet by mouth daily.   . Cholecalciferol (VITAMIN D3) 50 MCG (2000 UT) TABS Take 2,000 Units by mouth daily.   . melatonin 5 MG TABS Take 5 mg by mouth at bedtime as needed (sleep aid).  . Multiple Vitamin (MULTIVITAMIN ADULT PO) Take 1 tablet by mouth daily.   . Multiple Vitamins-Minerals (ZINC PO) Take 50 mg by mouth daily.   [name] omeprazole (PRILOSEC) 40 MG  capsule Take 40 mg by mouth daily.  [name] tetrabenazine (XENAZINE) 12.5 MG tablet Take 1 tablet (12.5 mg total) by mouth 2 (two) times daily.  . Wheat Dextrin (BENEFIBER PO) Take 1 tablet by mouth daily.  . [DISCONTINUED] DULoxetine (CYMBALTA) 30 MG capsule TAKE 2 CAPSULES BY MOUTH DAILY (Patient taking differently: Take 60 mg by mouth daily. )     Allergies:   Ibuprofen, Morphine, Morphine and related, Penicillin g, Penicillins, Risperdal [risperidone], Adhesive [tape], Pseudoephedrine, and Vitamin [cyanocobalamin]   Social History   Tobacco Use  . Smoking status: Never Smoker  . Smokeless tobacco: Never Used  Substance Use Topics  . Alcohol use: Not Currently    Comment: seldom  . Drug use: No     Family Hx: The patient's family history includes Diabetes in her brother and father; Heart disease in her father; Huntington's disease in her mother and sister; Hypertension in her brother, father, and mother; Scoliosis in her brother and sister; Skin cancer in her maternal grandmother.  ROS:   Please see the history of present illness.    Review of Systems  Constitutional: Positive for chills. Negative for fever and malaise/fatigue.  HENT: Positive for rhinorrhea and sore throat. Negative for congestion.   Respiratory: Positive for cough (white to a little hint of yellow and one time was green).   Cardiovascular: Negative for chest pain and palpitations.  Neurological: Negative for dizziness and tingling.  Psychiatric/Behavioral: Negative.     All other systems reviewed and are negative.   Labs/Other Tests and Data Reviewed:    Recent Labs: 05/29/2019: ALT 11 07/12/2019: TSH 2.060 11/29/2019: BUN 9; Creatinine, Ser 0.86; Hemoglobin 13.7; Platelets 380; Potassium 4.1; Sodium 140   Recent Lipid Panel Lab Results  Component Value Date/Time   CHOL 264 (H) 11/06/2019 11:22 AM   TRIG 124 11/06/2019 11:22 AM   HDL 70 11/06/2019 11:22 AM   CHOLHDL 3.8 11/06/2019 11:22 AM   CHOLHDL 5  10/29/2016 03:05 PM   LDLCALC 172 (H) 11/06/2019 11:22 AM    Wt Readings from Last 3 Encounters:  11/29/19 (!) 207 lb (93.9 kg)  11/06/19 207 lb 12.8 oz (94.3 kg)  09/20/19 202 lb (91.6 kg)     Exam:    Vital Signs:  There were no vitals taken for this visit.    Physical Exam Vitals reviewed.  Constitutional:      General: She is not in acute distress.    Appearance: Normal appearance.  Cardiovascular:     Rate and Rhythm: Normal rate and regular rhythm.  Pulmonary:     Effort: Pulmonary effort is normal. No respiratory distress.  Neurological:     General: No focal deficit present.     Mental Status: She is alert and oriented to person, place, and time.     Cranial Nerves: No  cranial nerve deficit.  Psychiatric:        Mood and Affect: Mood normal.        Behavior: Behavior normal.        Thought Content: Thought content normal.        Judgment: Judgment normal.     ASSESSMENT & PLAN:    1. Viral upper respiratory tract infection  Reports she had a negative covid test  Will treat with doxycycline since has been having symptoms for more than a week  No vital signs due to patient having a virtual visit - doxycycline (MONODOX) 100 MG capsule; Take 1 capsule (100 mg total) by mouth 2 (two) times daily.  Dispense: 20 capsule; Refill: 0  2. Nasal congestion  She is encouraged to use nasonex nasal spray to help with congestion - mometasone (NASONEX) 50 MCG/ACT nasal spray; Place 2 sprays into the nose daily.  Dispense: 17 g; Refill: 2   COVID-19 Education: The signs and symptoms of COVID-19 were discussed with the patient and how to seek care for testing (follow up with PCP or arrange E-visit).  The importance of social distancing was discussed today.  Patient Risk:   After full review of this patients clinical status, I feel that they are at least moderate risk at this time.  Time:   Today, I have spent 12 minutes/ seconds with the patient with telehealth  technology discussing above diagnoses.     Medication Adjustments/Labs and Tests Ordered: Current medicines are reviewed at length with the patient today.  Concerns regarding medicines are outlined above.   Tests Ordered: No orders of the defined types were placed in this encounter.   Medication Changes: Meds ordered this encounter  Medications  . doxycycline (MONODOX) 100 MG capsule    Sig: Take 1 capsule (100 mg total) by mouth 2 (two) times daily.    Dispense:  20 capsule    Refill:  0  . mometasone (NASONEX) 50 MCG/ACT nasal spray    Sig: Place 2 sprays into the nose daily.    Dispense:  17 g    Refill:  2    Disposition:  Follow up prn  Signed, Arnette Felts, FNP

## 2019-12-21 ENCOUNTER — Other Ambulatory Visit: Payer: Self-pay | Admitting: Nurse Practitioner

## 2019-12-21 ENCOUNTER — Ambulatory Visit (INDEPENDENT_AMBULATORY_CARE_PROVIDER_SITE_OTHER): Payer: Medicare HMO

## 2019-12-21 ENCOUNTER — Other Ambulatory Visit: Payer: Self-pay

## 2019-12-21 ENCOUNTER — Telehealth: Payer: Medicare HMO

## 2019-12-21 DIAGNOSIS — F331 Major depressive disorder, recurrent, moderate: Secondary | ICD-10-CM

## 2019-12-21 DIAGNOSIS — G10 Huntington's disease: Secondary | ICD-10-CM

## 2019-12-21 DIAGNOSIS — E782 Mixed hyperlipidemia: Secondary | ICD-10-CM | POA: Diagnosis not present

## 2019-12-21 DIAGNOSIS — F339 Major depressive disorder, recurrent, unspecified: Secondary | ICD-10-CM

## 2019-12-25 ENCOUNTER — Telehealth: Payer: Self-pay

## 2019-12-25 NOTE — Telephone Encounter (Signed)
Pt wants to know if dizziness is part of the upper respiratory infection she has, she states it comes and goes from time to time. Also ask if you can send something to the pharmacy for it

## 2019-12-25 NOTE — Telephone Encounter (Signed)
Yes it can come from nasal congestion, send meclizine 12.5 mg to take 2 times a day as needed. #30 tabs.

## 2019-12-26 ENCOUNTER — Other Ambulatory Visit: Payer: Self-pay

## 2019-12-26 MED ORDER — MECLIZINE HCL 12.5 MG PO TABS: 12.5000 mg | ORAL_TABLET | Freq: Two times a day (BID) | ORAL | 0 refills | Status: DC | PRN

## 2019-12-26 NOTE — Chronic Care Management (AMB) (Signed)
Chronic Care Management   Follow Up Note   12/21/2019 Name: Katherine Gregory MRN: [id number] DOB: 13-Feb-1966  Referred by: [name], [place] Reason for referral : Chronic Care Management (FU RN CM Call )   Katherine Gregory is a 54 y.o. year old female who is a primary care patient of [name], [place]. The CCM team was consulted for assistance with chronic disease management and care coordination needs.    Review of patient status, including review of consultants reports, relevant laboratory and other test results, and collaboration with appropriate care team members and the patient's provider was performed as part of comprehensive patient evaluation and provision of chronic care management services.    SDOH (Social Determinants of Health) assessments performed: No See Care Plan activities for detailed interventions related to Portal)   Placed CCM RN CM outbound call to patient for an update on her respiratory infection.    Outpatient Encounter Medications as of 12/21/2019  Medication Sig  . atorvastatin (LIPITOR) 20 MG tablet Take 1 tablet by mouth MWF (Patient taking differently: Take 20 mg by mouth every Monday, Wednesday, and Friday. )  . B Complex Vitamins (VITAMIN B COMPLEX PO) Take 1 tablet by mouth daily.   . Calcium Carb-Cholecalciferol (CALCIUM 1000 + D) 1000-800 MG-UNIT TABS Take 1 tablet by mouth daily.   . Cholecalciferol (VITAMIN D3) 50 MCG (2000 UT) TABS Take 2,000 Units by mouth daily.   [name] doxycycline (MONODOX) 100 MG capsule Take 1 capsule (100 mg total) by mouth 2 (two) times daily.  . DULoxetine (CYMBALTA) 30 MG capsule TAKE 2 CAPSULES BY MOUTH DAILY  . melatonin 5 MG TABS Take 5 mg by mouth at bedtime as needed (sleep aid).  . mometasone (NASONEX) 50 MCG/ACT nasal spray Place 2 sprays into the nose daily.  . Multiple Vitamin (MULTIVITAMIN ADULT PO) Take 1 tablet by mouth daily.   . Multiple Vitamins-Minerals (ZINC PO) Take 50 mg by mouth daily.   [name]  omeprazole (PRILOSEC) 40 MG capsule Take 40 mg by mouth daily.  [name] tetrabenazine (XENAZINE) 12.5 MG tablet Take 1 tablet (12.5 mg total) by mouth 2 (two) times daily.  . Wheat Dextrin (BENEFIBER PO) Take 1 tablet by mouth daily.  . [DISCONTINUED] DULoxetine (CYMBALTA) 30 MG capsule TAKE 2 CAPSULES BY MOUTH DAILY (Patient taking differently: Take 60 mg by mouth daily. )   No facility-administered encounter medications on file as of 12/21/2019.     Objective:  Lab Results  Component Value Date   HGBA1C 5.4 05/29/2019   HGBA1C 5.9 04/28/2016   HGBA1C 5.6 11/16/2013   Lab Results  Component Value Date   LDLCALC 172 (H) 11/06/2019   CREATININE 0.86 11/29/2019   BP Readings from Last 3 Encounters:  11/30/19 (!) 136/96  11/06/19 122/80  09/20/19 (!) 139/98    Goals Addressed      Patient Stated   .  "to get over this respiratory virus" (pt-stated)   On track     [place] (see longitudinal plan of care for additional care plan information)  Current Barriers:  [name] Knowledge Deficits related to evaluation and treatment related to upper respiratory illness  . Chronic Disease Management support and education needs related to Huntington's disease, Moderate episode of recurrent major depressive disorder, Mixed hyperlipidemia   Nurse Case Manager Clinical Goal(s):  [name] Over the next 7 days, patient will verbalize understanding of plan for evaluation and treatment of upper respiratory illness  Goal Met  . 12/21/19 New  Over the next 30 days, patient will report having made a full recovery from her upper respiratory infection w/o complications or hospitalization  CCM RN CM Interventions:  12/21/19 call completed with patient  . Inter-disciplinary care team collaboration (see longitudinal plan of care) . Evaluation of current treatment plan related to upper Respiratory illness and patient's adherence to plan as established by provider . Determined patient's respiratory symptoms have  improved however, she is still experiencing nasal congestion and was unable to fill the prescribed nasal spray . Determined patient f/u with PCP via virtual visit on 12/20/19; Determined patient was prescribed  .  . doxycycline (MONODOX) 100 MG capsule .    .   . Sig: Take 1 capsule (100 mg total) by mouth 2 (two) times daily. .    .   . Dispense:  20 capsule .    .   . Refill:  0 .  . . mometasone (NASONEX) 50 MCG/ACT nasal spray .    .   . Sig: Place 2 sprays into the nose daily. .    .   . Dispense:  17 g .    .   . Refill:  2     . Reinforced to patient to continue to rest, drink plenty of fluids and monitor for fever, advised patient to go to the ED and or call PCP office for an appointment if her symptoms worsen or persist . Patient states she feels better overall but continues to experience laryngitis and fatigue . Discussed plans with patient for ongoing care management follow up and provided patient with direct contact information for care management team  Patient Self Care Activities:  . Self administers medications as prescribed . Attends all scheduled provider appointments . Calls pharmacy for medication refills . Calls provider office for new concerns or questions  Initial goal documentation and Please see past updates related to this goal by clicking on the "Past Updates" button in the selected goal        Plan:   Telephone follow up appointment with care management team member scheduled for: 01/04/20  Barb Merino, RN, BSN, CCM Care Management Coordinator [place] [facility]  Direct Phone: [phone]

## 2019-12-26 NOTE — Patient Instructions (Addendum)
Visit Information  Goals Addressed      Patient Stated   .  "to get over this respiratory virus" (pt-stated)   On track     [place] (see longitudinal plan of care for additional care plan information)  Current Barriers:  [name] Knowledge Deficits related to evaluation and treatment related to upper respiratory illness  . Chronic Disease Management support and education needs related to Huntington's disease, Moderate episode of recurrent major depressive disorder, Mixed hyperlipidemia   Nurse Case Manager Clinical Goal(s):  [name] Over the next 7 days, patient will verbalize understanding of plan for evaluation and treatment of upper respiratory illness  Goal Met  . 12/21/19 New Over the next 30 days, patient will report having made a full recovery from her upper respiratory infection w/o complications or hospitalization  CCM RN CM Interventions:  12/21/19 call completed with patient  . Inter-disciplinary care team collaboration (see longitudinal plan of care) . Evaluation of current treatment plan related to upper Respiratory illness and patient's adherence to plan as established by provider . Determined patient's respiratory symptoms have improved however, she is still experiencing nasal congestion and was unable to fill the prescribed nasal spray . Determined patient f/u with PCP via virtual visit on 12/20/19; Determined patient was prescribed  .  . doxycycline (MONODOX) 100 MG capsule .    .   . Sig: Take 1 capsule (100 mg total) by mouth 2 (two) times daily. .    .   . Dispense:  20 capsule .    .   . Refill:  0 .  . . mometasone (NASONEX) 50 MCG/ACT nasal spray .    .   . Sig: Place 2 sprays into the nose daily. .    .   . Dispense:  17 g .    .   . Refill:  2     . Reinforced to patient to continue to rest, drink plenty of fluids and monitor for fever, advised patient to go to the ED and or call PCP office for an appointment if her symptoms worsen or persist . Patient states she  feels better overall but continues to experience laryngitis and fatigue . Discussed plans with patient for ongoing care management follow up and provided patient with direct contact information for care management team  Patient Self Care Activities:  . Self administers medications as prescribed . Attends all scheduled provider appointments . Calls pharmacy for medication refills . Calls provider office for new concerns or questions  Initial goal documentation and Please see past updates related to this goal by clicking on the "Past Updates" button in the selected goal        Patient verbalizes understanding of instructions provided today.   Telephone follow up appointment with care management team member scheduled for: 01/04/20  Barb Merino, RN, BSN, CCM Care Management Coordinator [place] [facility]  Direct Phone: [phone]

## 2020-01-04 ENCOUNTER — Telehealth: Payer: Medicare HMO

## 2020-01-08 ENCOUNTER — Other Ambulatory Visit: Payer: Self-pay

## 2020-01-08 DIAGNOSIS — R631 Polydipsia: Secondary | ICD-10-CM

## 2020-01-09 LAB — BMP8+EGFR
BUN/Creatinine Ratio: 17 (ref 9–23)
BUN: 15 mg/dL (ref 6–24)
CO2: 22 mmol/L (ref 20–29)
Calcium: 9.4 mg/dL (ref 8.7–10.2)
Chloride: 101 mmol/L (ref 96–106)
Creatinine, Ser: 0.86 mg/dL (ref 0.57–1.00)
GFR calc Af Amer: 90 mL/min/{1.73_m2} (ref >59)
GFR calc non Af Amer: 78 mL/min/{1.73_m2} (ref >59)
Glucose: 76 mg/dL (ref 65–99)
Potassium: 4.3 mmol/L (ref 3.5–5.2)
Sodium: 137 mmol/L (ref 134–144)

## 2020-01-09 LAB — HEMOGLOBIN A1C
Est. average glucose Bld gHb Est-mCnc: 123 mg/dL
Hgb A1c MFr Bld: 5.9 % — ABNORMAL HIGH (ref 4.8–5.6)

## 2020-01-21 ENCOUNTER — Other Ambulatory Visit: Payer: Self-pay | Admitting: Nurse Practitioner

## 2020-01-21 DIAGNOSIS — F331 Major depressive disorder, recurrent, moderate: Secondary | ICD-10-CM

## 2020-02-06 ENCOUNTER — Ambulatory Visit (INDEPENDENT_AMBULATORY_CARE_PROVIDER_SITE_OTHER): Payer: Medicare Other

## 2020-02-06 ENCOUNTER — Other Ambulatory Visit: Payer: Self-pay

## 2020-02-06 ENCOUNTER — Telehealth: Payer: Medicare Other

## 2020-02-06 DIAGNOSIS — E782 Mixed hyperlipidemia: Secondary | ICD-10-CM | POA: Diagnosis not present

## 2020-02-06 DIAGNOSIS — G10 Huntington's disease: Secondary | ICD-10-CM

## 2020-02-06 DIAGNOSIS — F331 Major depressive disorder, recurrent, moderate: Secondary | ICD-10-CM | POA: Diagnosis not present

## 2020-02-08 NOTE — Patient Instructions (Signed)
Visit Information  Goals Addressed      Patient Stated   .  "I would like to learn more about Prediabetes" (pt-stated)        CARE PLAN ENTRY (see longitudinal plan of care for additional care plan information)  Current Barriers:  [name] Knowledge Deficits related to disease process and Self Health management of Prediabetes . Chronic Disease Management support and education needs related to Huntington's disease, Moderate episode of recurrent major depressive disorder, Mixed hyperlipidemia   Nurse Case Manager Clinical Goal(s):  [name] Over the next 90 days, patient will work with the CCM team and PCP to address needs related to disease education and support for improved Self Health management of Prediabetes   CCM RN CM Interventions:  02/06/20 call completed with patient  . Inter-disciplinary care team collaboration (see longitudinal plan of care) . Evaluation of current treatment plan related to Prediabetes and patient's adherence to plan as established by provider. . Provided education to patient re: Prediabetes; Educated on A1c is noted to be 5.9 %; Educated on dietary and exercise recommendations  . Discussed plans with patient for ongoing care management follow up and provided patient with direct contact information for care management team . Provided patient with printed educational materials related to Prediabetes  Patient Self Care Activities:  . Self administers medications as prescribed . Attends all scheduled provider appointments . Calls pharmacy for medication refills . Calls provider office for new concerns or questions  Initial goal documentation     .  COMPLETED: "to continue to feel hopeful and encouraged" (pt-stated)        CARE PLAN ENTRY (see longtitudinal plan of care for additional care plan information)  Current Barriers:  [name] Knowledge Deficits related to evaluation and treatment of depression . Chronic Disease Management support and education needs related to  Huntington's Disease, depression, Fibromyalgia   Nurse Case Manager Clinical Goal(s):  [name] Over the next 90 days, patient will work with the CCM team and PCP to address needs related to disease education and support to improve Self Health management of depression   CCM RN CM Interventions:  02/06/20 call completed with patient  . Evaluation of current treatment plan related to depression and patient's adherence to plan as established by provider. . Reviewed medications with patient and discussed patient is getting good effectiveness from Cymbalta and is feeling more hopeful and encouraged; Instructed patient to notify the CCM RN and or PCP promptly of new or worsening symptoms of depression  . Discussed plans with patient for ongoing care management follow up and provided patient with direct contact information for care management team  Patient Self Care Activities:  . Self administers medications as prescribed . Attends all scheduled provider appointments . Calls pharmacy for medication refills . Calls provider office for new concerns or questions  Please see past updates related to this goal by clicking on the "Past Updates" button in the selected goal      .  COMPLETED: "to get over this respiratory virus" (pt-stated)        CARE PLAN ENTRY (see longitudinal plan of care for additional care plan information)  Current Barriers:  [name] Knowledge Deficits related to evaluation and treatment related to upper respiratory illness  . Chronic Disease Management support and education needs related to Huntington's disease, Moderate episode of recurrent major depressive disorder, Mixed hyperlipidemia   Nurse Case Manager Clinical Goal(s):  [name] Over the next 7 days, patient will verbalize understanding of plan for evaluation  and treatment of upper respiratory illness  Goal Met  . 12/21/19 New Over the next 30 days, patient will report having made a full recovery from her upper respiratory infection w/o  complications or hospitalization  CCM RN CM Interventions:  02/06/20 call completed with patient  . Inter-disciplinary care team collaboration (see longitudinal plan of care) . Evaluation of current treatment plan related to upper Respiratory illness and patient's adherence to plan as established by provider . Determined patient's respiratory virus has resolved w/o complications or IP/ED events  . Discussed plans with patient for ongoing care management follow up and provided patient with direct contact information for care management team  Patient Self Care Activities:  . Self administers medications as prescribed . Attends all scheduled provider appointments . Calls pharmacy for medication refills . Calls provider office for new concerns or questions  Please see past updates related to this goal by clicking on the "Past Updates" button in the selected goal      .  "to have less stiffness in muscles and joints" (pt-stated)   Not on track     [place] (see longtitudinal plan of care for additional care plan information)  Current Barriers:  [name] Knowledge Deficits related to evaluation and treatment of myalgia and decreased ROM . Chronic Disease Management support and education needs related to Huntington's disease; Depression, Fibromyalgia  Nurse Case Manager Clinical Goal(s):  [name] Over the next 90 days, patient will verbalize understanding of plan for evaluation and start of PT/OT for myalgia and decreased ROM Goal Not Met due to patient placed services on hold   . New 02/06/20 Over the next 90 days, patient will work with the PCP to address needs related to disease education and support to improve Self Health management of myalgia and decreased ROM   CCM RN CM Interventions:  02/06/20 call completed with patient  . Evaluation of current treatment plan related to Fibromyalgia and Huntington's disease and patient's adherence to plan as established by provider. . Determined  patient's pain is stable, however persistent  . Determined patient has new insurance and will consider outpatient PT . Encouraged patient to balance her activity with rest, to drink plenty of water and to make sure she is getting plenty of sleep for optimal health  . Determined patient would like her Vitamin D rechecked since last level was taken in 2018 . Educated on potential SE related to low Vitamin d; Educated on ways to improve Self Health management of Vitamin d deficiency . Sent in basket message to PCP [name], FNP requesting vitamin d level be checked at next OV  . Discussed next OV with PCP is scheduled for 03/31/20 @11 :00 AM  . Mailed printed educational material related to Vitamin d deficiency for review and discuss following next lab draw . Discussed plans with patient for ongoing care management follow up and provided patient with direct contact information for care management team  Patient Self Care Activities:  . Self administers medications as prescribed . Attends all scheduled provider appointments . Calls pharmacy for medication refills . Performs ADL's independently . Performs IADL's independently . Calls provider office for new concerns or questions  Please see past updates related to this goal by clicking on the "Past Updates" button in the selected goal        Patient verbalizes understanding of instructions provided today.   Telephone follow up appointment with care management team member scheduled for: 04/02/20  Barb Merino, RN, BSN, CCM Care  Management Coordinator [facility] [facility]  Direct Phone: [phone]

## 2020-02-08 NOTE — Chronic Care Management (AMB) (Signed)
Chronic Care Management   Follow Up Note   02/08/2020 Name: Katherine Gregory MRN: [id number] DOB: 09/15/1965  Referred by: [name], [place] Reason for referral : Chronic Care Management (FU RN CM Call )   Katherine Gregory is a 54 y.o. year old female who is a primary care patient of [name], [place]. The CCM team was consulted for assistance with chronic disease management and care coordination needs.    Review of patient status, including review of consultants reports, relevant laboratory and other test results, and collaboration with appropriate care team members and the patient's provider was performed as part of comprehensive patient evaluation and provision of chronic care management services.    SDOH (Social Determinants of Health) assessments performed: Yes - no acute challenges identified at this time See Care Plan activities for detailed interventions related to [place])    Placed outbound CCM RN CM call to patient for a care plan update.   Outpatient Encounter Medications as of 02/06/2020  Medication Sig  . B Complex Vitamins (VITAMIN B COMPLEX PO) Take 1 tablet by mouth daily.   . Calcium Carb-Cholecalciferol (CALCIUM 1000 + D) 1000-800 MG-UNIT TABS Take 1 tablet by mouth daily.   . Cholecalciferol (VITAMIN D3) 50 MCG (2000 UT) TABS Take 2,000 Units by mouth daily.   . DULoxetine (CYMBALTA) 30 MG capsule TAKE 1 CAPSULE BY MOUTH EVERY DAY  . atorvastatin (LIPITOR) 20 MG tablet Take 1 tablet by mouth MWF (Patient taking differently: Take 20 mg by mouth every Monday, Wednesday, and Friday. )  . doxycycline (MONODOX) 100 MG capsule Take 1 capsule (100 mg total) by mouth 2 (two) times daily.  . meclizine (ANTIVERT) 12.5 MG tablet Take 1 tablet (12.5 mg total) by mouth 2 (two) times daily as needed for dizziness (Take by mouth 2 times a day as needed).  . melatonin 5 MG TABS Take 5 mg by mouth at bedtime as needed (sleep aid).  . mometasone (NASONEX) 50 MCG/ACT nasal  spray Place 2 sprays into the nose daily.  . Multiple Vitamin (MULTIVITAMIN ADULT PO) Take 1 tablet by mouth daily.   . Multiple Vitamins-Minerals (ZINC PO) Take 50 mg by mouth daily.   Katherine Gregory omeprazole (PRILOSEC) 40 MG capsule Take 40 mg by mouth daily.  Katherine Gregory tetrabenazine (XENAZINE) 12.5 MG tablet Take 1 tablet (12.5 mg total) by mouth 2 (two) times daily.  . Wheat Dextrin (BENEFIBER PO) Take 1 tablet by mouth daily.   No facility-administered encounter medications on file as of 02/06/2020.     Objective:  Lab Results  Component Value Date   HGBA1C 5.9 (H) 01/08/2020   HGBA1C 5.4 05/29/2019   HGBA1C 5.9 04/28/2016   Lab Results  Component Value Date   LDLCALC 172 (H) 11/06/2019   CREATININE 0.86 01/08/2020   BP Readings from Last 3 Encounters:  11/30/19 (!) 136/96  11/06/19 122/80  09/20/19 (!) 139/98    Goals Addressed      Patient Stated   .  "I would like to learn more about Prediabetes" (pt-stated)        CARE PLAN ENTRY (see longitudinal plan of care for additional care plan information)  Current Barriers:  Katherine Gregory Knowledge Deficits related to disease process and Self Health management of Prediabetes . Chronic Disease Management support and education needs related to Huntington's disease, Moderate episode of recurrent major depressive disorder, Mixed hyperlipidemia   Nurse Case Manager Clinical Goal(s):  Katherine Gregory Over the next 90 days, patient will work with the  CCM team and PCP to address needs related to disease education and support for improved Self Health management of Prediabetes   CCM RN CM Interventions:  02/06/20 call completed with patient  . Inter-disciplinary care team collaboration (see longitudinal plan of care) . Evaluation of current treatment plan related to Prediabetes and patient's adherence to plan as established by provider. . Provided education to patient re: Prediabetes; Educated on A1c is noted to be 5.9 %; Educated on dietary and exercise recommendations  .  Discussed plans with patient for ongoing care management follow up and provided patient with direct contact information for care management team . Provided patient with printed educational materials related to Prediabetes  Patient Self Care Activities:  . Self administers medications as prescribed . Attends all scheduled provider appointments . Calls pharmacy for medication refills . Calls provider office for new concerns or questions  Initial goal documentation     .  COMPLETED: "to continue to feel hopeful and encouraged" (pt-stated)        CARE PLAN ENTRY (see longtitudinal plan of care for additional care plan information)  Current Barriers:  Katherine Gregory Knowledge Deficits related to evaluation and treatment of depression . Chronic Disease Management support and education needs related to Huntington's Disease, depression, Fibromyalgia   Nurse Case Manager Clinical Goal(s):  Katherine Gregory Over the next 90 days, patient will work with the CCM team and PCP to address needs related to disease education and support to improve Self Health management of depression   CCM RN CM Interventions:  02/06/20 call completed with patient  . Evaluation of current treatment plan related to depression and patient's adherence to plan as established by provider. . Reviewed medications with patient and discussed patient is getting good effectiveness from Cymbalta and is feeling more hopeful and encouraged; Instructed patient to notify the CCM RN and or PCP promptly of new or worsening symptoms of depression  . Discussed plans with patient for ongoing care management follow up and provided patient with direct contact information for care management team  Patient Self Care Activities:  . Self administers medications as prescribed . Attends all scheduled provider appointments . Calls pharmacy for medication refills . Calls provider office for new concerns or questions  Please see past updates related to this goal by clicking  on the "Past Updates" button in the selected goal      .  COMPLETED: "to get over this respiratory virus" (pt-stated)        CARE PLAN ENTRY (see longitudinal plan of care for additional care plan information)  Current Barriers:  Katherine Gregory Knowledge Deficits related to evaluation and treatment related to upper respiratory illness  . Chronic Disease Management support and education needs related to Huntington's disease, Moderate episode of recurrent major depressive disorder, Mixed hyperlipidemia   Nurse Case Manager Clinical Goal(s):  Katherine Gregory Over the next 7 days, patient will verbalize understanding of plan for evaluation and treatment of upper respiratory illness  Goal Met  . 12/21/19 New Over the next 30 days, patient will report having made a full recovery from her upper respiratory infection w/o complications or hospitalization  CCM RN CM Interventions:  02/06/20 call completed with patient  . Inter-disciplinary care team collaboration (see longitudinal plan of care) . Evaluation of current treatment plan related to upper Respiratory illness and patient's adherence to plan as established by provider . Determined patient's respiratory virus has resolved w/o complications or IP/ED events  . Discussed plans with patient for ongoing care management follow up  and provided patient with direct contact information for care management team  Patient Self Care Activities:  . Self administers medications as prescribed . Attends all scheduled provider appointments . Calls pharmacy for medication refills . Calls provider office for new concerns or questions  Please see past updates related to this goal by clicking on the "Past Updates" button in the selected goal      .  "to have less stiffness in muscles and joints" (pt-stated)   Not on track     [place] (see longtitudinal plan of care for additional care plan information)  Current Barriers:  Katherine Gregory Knowledge Deficits related to evaluation and  treatment of myalgia and decreased ROM . Chronic Disease Management support and education needs related to Huntington's disease; Depression, Fibromyalgia  Nurse Case Manager Clinical Goal(s):  Katherine Gregory Over the next 90 days, patient will verbalize understanding of plan for evaluation and start of PT/OT for myalgia and decreased ROM Goal Not Met due to patient placed services on hold   . New 02/06/20 Over the next 90 days, patient will work with the PCP to address needs related to disease education and support to improve Self Health management of myalgia and decreased ROM   CCM RN CM Interventions:  02/06/20 call completed with patient  . Evaluation of current treatment plan related to Fibromyalgia and Huntington's disease and patient's adherence to plan as established by provider. . Determined patient's pain is stable, however persistent  . Determined patient has new insurance and will consider outpatient PT . Encouraged patient to balance her activity with rest, to drink plenty of water and to make sure she is getting plenty of sleep for optimal health  . Determined patient would like her Vitamin D rechecked since last level was taken in 2018 . Educated on potential SE related to low Vitamin d; Educated on ways to improve Self Health management of Vitamin d deficiency . Sent in basket message to PCP [name], FNP requesting vitamin d level be checked at next OV  . Discussed next OV with PCP is scheduled for 03/31/20 @11 :00 AM  . Mailed printed educational material related to Vitamin d deficiency for review and discuss following next lab draw . Discussed plans with patient for ongoing care management follow up and provided patient with direct contact information for care management team  Patient Self Care Activities:  . Self administers medications as prescribed . Attends all scheduled provider appointments . Calls pharmacy for medication refills . Performs ADL's independently . Performs IADL's  independently . Calls provider office for new concerns or questions  Please see past updates related to this goal by clicking on the "Past Updates" button in the selected goal        Plan:   Telephone follow up appointment with care management team member scheduled for: 04/02/20  Barb Merino, RN, BSN, CCM Care Management Coordinator [place] [facility]  Direct Phone: [phone]

## 2020-03-03 ENCOUNTER — Telehealth: Payer: Self-pay | Admitting: *Deleted

## 2020-03-03 NOTE — Telephone Encounter (Signed)
PA for tetrabenazine started on covermymeds (key: [id number]). Pt has pharmacy coverage through [employer] [phone]). Decision pending.

## 2020-03-03 NOTE — Telephone Encounter (Signed)
PA approved through 05/09/2021. MO#[id number].

## 2020-03-10 ENCOUNTER — Other Ambulatory Visit: Payer: Self-pay

## 2020-03-10 DIAGNOSIS — F331 Major depressive disorder, recurrent, moderate: Secondary | ICD-10-CM

## 2020-03-10 MED ORDER — DULOXETINE HCL 30 MG PO CPEP: ORAL_CAPSULE | ORAL | 0 refills | Status: DC

## 2020-03-31 ENCOUNTER — Ambulatory Visit: Payer: Medicare HMO | Admitting: Nurse Practitioner

## 2020-04-02 ENCOUNTER — Telehealth: Payer: Medicare Other

## 2020-04-18 ENCOUNTER — Other Ambulatory Visit: Payer: Self-pay | Admitting: Nurse Practitioner

## 2020-04-21 ENCOUNTER — Encounter: Payer: Self-pay | Admitting: Nurse Practitioner

## 2020-04-21 ENCOUNTER — Other Ambulatory Visit: Payer: Self-pay

## 2020-04-21 ENCOUNTER — Ambulatory Visit (INDEPENDENT_AMBULATORY_CARE_PROVIDER_SITE_OTHER): Payer: Medicare Other | Admitting: Nurse Practitioner

## 2020-04-21 VITALS — BP 118/80 | HR 85 | Temp 98.3°F | Ht 70.0 in | Wt 213.4 lb

## 2020-04-21 DIAGNOSIS — R479 Unspecified speech disturbances: Secondary | ICD-10-CM

## 2020-04-21 DIAGNOSIS — E782 Mixed hyperlipidemia: Secondary | ICD-10-CM | POA: Diagnosis not present

## 2020-04-21 DIAGNOSIS — G10 Huntington's disease: Secondary | ICD-10-CM | POA: Diagnosis not present

## 2020-04-21 DIAGNOSIS — R7309 Other abnormal glucose: Secondary | ICD-10-CM | POA: Diagnosis not present

## 2020-04-21 DIAGNOSIS — Z23 Encounter for immunization: Secondary | ICD-10-CM | POA: Diagnosis not present

## 2020-04-21 NOTE — Patient Instructions (Signed)
Get estroven over the counter to take daily for hot flashes.    Menopause Menopause is the normal time of life when menstrual periods stop completely. It is usually confirmed by 12 months without a menstrual period. The transition to menopause (perimenopause) most often happens between the ages of 53 and 47. During perimenopause, hormone levels change in your body, which can cause symptoms and affect your health. Menopause may increase your risk for:  Loss of bone (osteoporosis), which causes bone breaks (fractures).  Depression.  Hardening and narrowing of the arteries (atherosclerosis), which can cause heart attacks and strokes. What are the causes? This condition is usually caused by a natural change in hormone levels that happens as you get older. The condition may also be caused by surgery to remove both ovaries (bilateral oophorectomy). What increases the risk? This condition is more likely to start at an earlier age if you have certain medical conditions or treatments, including:  A tumor of the pituitary gland in the brain.  A disease that affects the ovaries and hormone production.  Radiation treatment for cancer.  Certain cancer treatments, such as chemotherapy or hormone (anti-estrogen) therapy.  Heavy smoking and excessive alcohol use.  Family history of early menopause. This condition is also more likely to develop earlier in women who are very thin. What are the signs or symptoms? Symptoms of this condition include:  Hot flashes.  Irregular menstrual periods.  Night sweats.  Changes in feelings about sex. This could be a decrease in sex drive or an increased comfort around your sexuality.  Vaginal dryness and thinning of the vaginal walls. This may cause painful intercourse.  Dryness of the skin and development of wrinkles.  Headaches.  Problems sleeping (insomnia).  Mood swings or irritability.  Memory problems.  Weight gain.  Hair growth on the  face and chest.  Bladder infections or problems with urinating. How is this diagnosed? This condition is diagnosed based on your medical history, a physical exam, your age, your menstrual history, and your symptoms. Hormone tests may also be done. How is this treated? In some cases, no treatment is needed. You and your health care provider should make a decision together about whether treatment is necessary. Treatment will be based on your individual condition and preferences. Treatment for this condition focuses on managing symptoms. Treatment may include:  Menopausal hormone therapy (MHT).  Medicines to treat specific symptoms or complications.  Acupuncture.  Vitamin or herbal supplements. Before starting treatment, make sure to let your health care provider know if you have a personal or family history of:  Heart disease.  Breast cancer.  Blood clots.  Diabetes.  Osteoporosis. Follow these instructions at home: Lifestyle  Do not use any products that contain nicotine or tobacco, such as cigarettes and e-cigarettes. If you need help quitting, ask your health care provider.  Get at least 30 minutes of physical activity on 5 or more days each week.  Avoid alcoholic and caffeinated beverages, as well as spicy foods. This may help prevent hot flashes.  Get 7-8 hours of sleep each night.  If you have hot flashes, try: ? Dressing in layers. ? Avoiding things that may trigger hot flashes, such as spicy food, warm places, or stress. ? Taking slow, deep breaths when a hot flash starts. ? Keeping a fan in your home and office.  Find ways to manage stress, such as deep breathing, meditation, or journaling.  Consider going to group therapy with other women who are having  menopause symptoms. Ask your health care provider about recommended group therapy meetings. Eating and drinking  Eat a healthy, balanced diet that contains whole grains, lean protein, low-fat dairy, and plenty  of fruits and vegetables.  Your health care provider may recommend adding more soy to your diet. Foods that contain soy include tofu, tempeh, and soy milk.  Eat plenty of foods that contain calcium and vitamin D for bone health. Items that are rich in calcium include low-fat milk, yogurt, beans, almonds, sardines, broccoli, and kale. Medicines  Take over-the-counter and prescription medicines only as told by your health care provider.  Talk with your health care provider before starting any herbal supplements. If prescribed, take vitamins and supplements as told by your health care provider. These may include: ? Calcium. Women age 44 and older should get 1,200 mg (milligrams) of calcium every day. ? Vitamin D. Women need 600-800 International Units of vitamin D each day. ? Vitamins B12 and B6. Aim for 50 micrograms of B12 and 1.5 mg of B6 each day. General instructions  Keep track of your menstrual periods, including: ? When they occur. ? How heavy they are and how long they last. ? How much time passes between periods.  Keep track of your symptoms, noting when they start, how often you have them, and how long they last.  Use vaginal lubricants or moisturizers to help with vaginal dryness and improve comfort during sex.  Keep all follow-up visits as told by your health care provider. This is important. This includes any group therapy or counseling. Contact a health care provider if:  You are still having menstrual periods after age 1.  You have pain during sex.  You have not had a period for 12 months and you develop vaginal bleeding. Get help right away if:  You have: ? Severe depression. ? Excessive vaginal bleeding. ? Pain when you urinate. ? A fast or irregular heart beat (palpitations). ? Severe headaches. ? Abdomen (abdominal) pain or severe indigestion.  You fell and you think you have a broken bone.  You develop leg or chest pain.  You develop vision  problems.  You feel a lump in your breast. Summary  Menopause is the normal time of life when menstrual periods stop completely. It is usually confirmed by 12 months without a menstrual period.  The transition to menopause (perimenopause) most often happens between the ages of 5 and 73.  Symptoms can be managed through medicines, lifestyle changes, and complementary therapies such as acupuncture.  Eat a balanced diet that is rich in nutrients to promote bone health and heart health and to manage symptoms during menopause. This information is not intended to replace advice given to you by your health care provider. Make sure you discuss any questions you have with your health care provider. Document Revised: 04/08/2017 Document Reviewed: 05/29/2016 Elsevier Patient Education  2020 [employer].

## 2020-04-21 NOTE — Progress Notes (Signed)
I,Yamilka Roman [employer] as a Education administrator for [employer], FNP.,have documented all relevant documentation on the behalf of [name], FNP,as directed by  [name], FNP while in the presence of [name], [place]. This visit occurred during the SARS-CoV-2 public health emergency.  Safety protocols were in place, including screening questions prior to the visit, additional usage of staff PPE, and extensive cleaning of exam room while observing appropriate contact time as indicated for disinfecting solutions.  Subjective:     Patient ID: Katherine Gregory , female    DOB: December 26, 1965 , 54 y.o.   MRN: [id number]   Chief Complaint  Patient presents with  . handicap placard    Patient stated she is having issues walking   . hunnington's disease    Patient stated she has been having difficulties with her speech    HPI  She reports she is having difficulty with speech - just started, the medication she was taking before  She was having difficulty with her words and with word formulation. This has occurred before in the past - she is on tetrabenazine and has been for about 3 years, started it in [place].    She has been having difficulty with transportation since her daughter is working 2nd shift.    She also needs a handicap placard, her daughter drives for her.  She has to stop frequently when walking    Past Medical History:  Diagnosis Date  . Acid reflux   . Bleeding gastric ulcer   . Cellulitis   . Cervical back pain with evidence of disc disease   . Depression   . Dysuria   . FATIGUE   . Fibromyalgia   . FLANK PAIN, RIGHT   . GOITER   . HEADACHES, HX OF   . Huntington's disease ([place])   . IBS (irritable bowel syndrome)   . Palpitations   . Post partum depression   . Seasonal allergies   . URTICARIA   . Vertigo   . Vitamin D deficiency      Family History  Problem Relation Age of Onset  . Hypertension Mother   . Huntington's disease Mother        deceased  July 29, 2022  . Diabetes Father   . Hypertension Father   . Heart disease Father   . Scoliosis Sister   . Huntington's disease Sister   . Diabetes Brother   . Hypertension Brother   . Scoliosis Brother   . Skin cancer Maternal Grandmother      Current Outpatient Medications:  .  atorvastatin (LIPITOR) 20 MG tablet, Take 1 tablet by mouth MWF (Patient taking differently: Take 20 mg by mouth every Monday, Wednesday, and Friday.), Disp: 45 tablet, Rfl: 1 .  B Complex Vitamins (VITAMIN B COMPLEX PO), Take 1 tablet by mouth daily. , Disp: , Rfl:  .  Calcium Carb-Cholecalciferol (CALCIUM 1000 + D) 1000-800 MG-UNIT TABS, Take 1 tablet by mouth daily. , Disp: , Rfl:  .  Cholecalciferol (VITAMIN D3) 50 MCG (2000 UT) TABS, Take 2,000 Units by mouth daily. , Disp: , Rfl:  .  DULoxetine (CYMBALTA) 30 MG capsule, TAKE 2 CAPSULE BY MOUTH EVERY DAY, Disp: 180 capsule, Rfl: 0 .  melatonin 5 MG TABS, Take 5 mg by mouth at bedtime as needed (sleep aid)., Disp: , Rfl:  .  Multiple Vitamin (MULTIVITAMIN ADULT PO), Take 1 tablet by mouth daily. , Disp: , Rfl:  .  Multiple Vitamins-Minerals (ZINC PO), Take 50 mg by mouth  daily. , Disp: , Rfl:  .  omeprazole (PRILOSEC) 40 MG capsule, TAKE 1 CAPSULE BY MOUTH EVERY DAY, Disp: 90 capsule, Rfl: 0 .  tetrabenazine (XENAZINE) 12.5 MG tablet, Take 1 tablet (12.5 mg total) by mouth 2 (two) times daily., Disp: 60 tablet, Rfl: 11 .  Wheat Dextrin (BENEFIBER PO), Take 1 tablet by mouth daily., Disp: , Rfl:    Allergies  Allergen Reactions  . Ibuprofen Other (See Comments)    "It intensifies my pain." Made pain and symptoms worse. "It intensifies my pain."  . Morphine Itching    Red blotches on skin and burning.  . Morphine And Related Itching and Other (See Comments)    Reaction burns Reaction burns  . Penicillin G Itching  . Penicillins Hives and Itching    Has patient had a PCN reaction causing immediate rash, facial/tongue/throat swelling, SOB or lightheadedness  with hypotension: Yes Has patient had a PCN reaction causing severe rash involving mucus membranes or skin necrosis: No Has patient had a PCN reaction that required hospitalization No Has patient had a PCN reaction occurring within the last 10 years: No If all of the above answers are "NO", then may proceed with Cephalosporin use.   [name] Risperdal [Risperidone] Other (See Comments)    Confusion, oversedation   . Adhesive [Tape] Itching    Itching and burning at site Itching and burning at site  . Pseudoephedrine Other (See Comments) and Palpitations    Speed up heart rate Speed up heart rate  . Vitamin [Cyanocobalamin] Hives and Rash     Review of Systems  Constitutional: Negative.   HENT: Positive for voice change.   Respiratory: Negative.  Negative for cough and wheezing.   Cardiovascular: Negative.  Negative for chest pain, palpitations and leg swelling.  Neurological: Positive for speech difficulty. Negative for dizziness, light-headedness and headaches.  Psychiatric/Behavioral: Negative.      Today's Vitals   04/21/20 1015  BP: 118/80  Pulse: 85  Temp: 98.3 F (36.8 C)  TempSrc: Oral  Weight: 213 lb 6.4 oz (96.8 kg)  Height: 5' 10"  (1.778 m)  PainSc: 0-No pain   Body mass index is 30.62 kg/m.   Objective:  Physical Exam Constitutional:      General: She is not in acute distress.    Appearance: Normal appearance. She is obese.  HENT:     Mouth/Throat:     Comments: Voice is hypophonic and choppy at times Cardiovascular:     Rate and Rhythm: Normal rate and regular rhythm.     Pulses: Normal pulses.     Heart sounds: Normal heart sounds. No murmur heard.   Pulmonary:     Effort: Pulmonary effort is normal. No respiratory distress.     Breath sounds: Normal breath sounds. No wheezing.  Neurological:     General: No focal deficit present.     Mental Status: She is alert and oriented to person, place, and time.     Cranial Nerves: No cranial nerve deficit.   Psychiatric:        Mood and Affect: Mood normal.        Behavior: Behavior normal.        Thought Content: Thought content normal.        Judgment: Judgment normal.         Assessment And Plan:     1. Mixed hyperlipidemia   Chronic, controlled  Continue with current medications, she is tolerating well - Lipid panel - CMP14+EGFR  2.  Huntington's disease ([place])  This is chronic and progressive,   I have encouraged her to remain in contact with her Neurologist for her changes  3. Difficulty with speech  I feel this is related to her Huntington's disease as she will have changes in her voice as she has disease progression  4. Abnormal glucose  Chronic, stable  No current medications - Hemoglobin A1c  5. Need for influenza vaccination  Influenza vaccine administered  Encouraged to take Tylenol as needed for fever or muscle aches. - Flu Vaccine QUAD 6+ mos PF IM (Fluarix Quad PF)   I have given her a handicap sticker due to her having to stop frequently when walking long distances.   Patient was given opportunity to ask questions. Patient verbalized understanding of the plan and was able to repeat key elements of the plan. All questions were answered to their satisfaction.    Teola Bradley, FNP, have reviewed all documentation for this visit. The documentation on 04/27/20 for the exam, diagnosis, procedures, and orders are all accurate and complete.  THE PATIENT IS ENCOURAGED TO PRACTICE SOCIAL DISTANCING DUE TO THE COVID-19 PANDEMIC.

## 2020-05-01 ENCOUNTER — Telehealth: Payer: Medicare Other

## 2020-05-05 ENCOUNTER — Telehealth: Payer: Self-pay

## 2020-05-05 NOTE — Telephone Encounter (Signed)
-----   Message from Arnette Felts, FNP sent at 04/30/2020 11:19 PM EST ----- Did she get labs?  ----- Message ----- From: SYSTEM Sent: 04/26/2020  12:12 AM EST To: Arnette Felts, FNP

## 2020-05-05 NOTE — Telephone Encounter (Signed)
Patient will call back to let me know when she can come in to do her labs. YL,RMA

## 2020-05-14 ENCOUNTER — Other Ambulatory Visit: Payer: Self-pay

## 2020-05-14 DIAGNOSIS — E782 Mixed hyperlipidemia: Secondary | ICD-10-CM

## 2020-05-14 MED ORDER — ATORVASTATIN CALCIUM 20 MG PO TABS: 20.0000 mg | ORAL_TABLET | ORAL | 1 refills | Status: DC

## 2020-05-22 ENCOUNTER — Telehealth: Payer: Self-pay | Admitting: Neurology

## 2020-05-22 ENCOUNTER — Ambulatory Visit: Payer: Medicare Other | Admitting: Neurology

## 2020-05-22 NOTE — Telephone Encounter (Signed)
FYI: LVM Notified Pt of bad weather on Sunday. Ask patient to call the office on Monday before leaving your home to make sure the office is open. Pt confirmed understood

## 2020-05-26 ENCOUNTER — Ambulatory Visit: Payer: Medicare Other | Admitting: Neurology

## 2020-05-28 ENCOUNTER — Other Ambulatory Visit: Payer: Self-pay

## 2020-05-28 ENCOUNTER — Ambulatory Visit: Payer: Medicare Other | Admitting: Nurse Practitioner

## 2020-05-28 DIAGNOSIS — E782 Mixed hyperlipidemia: Secondary | ICD-10-CM

## 2020-05-28 MED ORDER — ATORVASTATIN CALCIUM 20 MG PO TABS: 20.0000 mg | ORAL_TABLET | ORAL | 1 refills | Status: DC

## 2020-05-28 MED ORDER — OMEPRAZOLE 40 MG PO CPDR: DELAYED_RELEASE_CAPSULE | ORAL | 1 refills | Status: DC

## 2020-06-10 ENCOUNTER — Telehealth: Payer: Self-pay | Admitting: Neurology

## 2020-06-10 NOTE — Telephone Encounter (Signed)
She has never been evaluated here for headaches. For more immediate care, she has been instructed to contact her PCP. If her PCP feels a further workup with neurological is needed, then she will ask them to send over a referral for this problem.

## 2020-06-10 NOTE — Telephone Encounter (Signed)
Pt. states for the past 3 days, she's been having headaches back to back & nothing has been relieving these. She states she had a slight one this morning, but has not been experiencing any really today. She states she was once diagnosed with clusters & she is asking if she can be prescribed something for the headaches. Please advise.

## 2020-06-11 ENCOUNTER — Telehealth: Payer: Medicare Other

## 2020-06-12 ENCOUNTER — Other Ambulatory Visit: Payer: Self-pay | Admitting: Nurse Practitioner

## 2020-06-12 DIAGNOSIS — E782 Mixed hyperlipidemia: Secondary | ICD-10-CM

## 2020-06-18 ENCOUNTER — Other Ambulatory Visit: Payer: Self-pay

## 2020-06-18 ENCOUNTER — Encounter: Payer: Self-pay | Admitting: Nurse Practitioner

## 2020-06-18 ENCOUNTER — Ambulatory Visit (INDEPENDENT_AMBULATORY_CARE_PROVIDER_SITE_OTHER): Payer: Medicare Other | Admitting: Nurse Practitioner

## 2020-06-18 VITALS — Temp 99.1°F | Ht 67.2 in | Wt 213.4 lb

## 2020-06-18 DIAGNOSIS — R7309 Other abnormal glucose: Secondary | ICD-10-CM

## 2020-06-18 DIAGNOSIS — G43809 Other migraine, not intractable, without status migrainosus: Secondary | ICD-10-CM

## 2020-06-18 DIAGNOSIS — M25562 Pain in left knee: Secondary | ICD-10-CM | POA: Diagnosis not present

## 2020-06-18 DIAGNOSIS — J3489 Other specified disorders of nose and nasal sinuses: Secondary | ICD-10-CM | POA: Diagnosis not present

## 2020-06-18 DIAGNOSIS — E782 Mixed hyperlipidemia: Secondary | ICD-10-CM

## 2020-06-18 DIAGNOSIS — H9193 Unspecified hearing loss, bilateral: Secondary | ICD-10-CM

## 2020-06-18 DIAGNOSIS — M25561 Pain in right knee: Secondary | ICD-10-CM | POA: Diagnosis not present

## 2020-06-18 DIAGNOSIS — Z1152 Encounter for screening for COVID-19: Secondary | ICD-10-CM | POA: Diagnosis not present

## 2020-06-18 DIAGNOSIS — R42 Dizziness and giddiness: Secondary | ICD-10-CM

## 2020-06-18 DIAGNOSIS — Z1231 Encounter for screening mammogram for malignant neoplasm of breast: Secondary | ICD-10-CM | POA: Diagnosis not present

## 2020-06-18 MED ORDER — DOXYCYCLINE HYCLATE 100 MG PO TBEC
100.0000 mg | DELAYED_RELEASE_TABLET | Freq: Two times a day (BID) | ORAL | 0 refills | Status: DC
Start: 2020-06-18 — End: 2020-06-22

## 2020-06-18 MED ORDER — FLUTICASONE PROPIONATE 50 MCG/ACT NA SUSP: 2.0000 | Freq: Every day | NASAL | 2 refills | Status: AC

## 2020-06-18 NOTE — Progress Notes (Signed)
I,Yamilka Roman [employer] as a Education administrator for [employer], FNP.,have documented all relevant documentation on the behalf of [name], FNP,as directed by  [name], FNP while in the presence of [name], [place].  This visit occurred during the SARS-CoV-2 public health emergency.  Safety protocols were in place, including screening questions prior to the visit, additional usage of staff PPE, and extensive cleaning of exam room while observing appropriate contact time as indicated for disinfecting solutions.  Subjective:     Patient ID: Katherine Gregory , female    DOB: 06-27-1965 , 55 y.o.   MRN: [id number]   Chief Complaint  Patient presents with  . URI    Patient stated she has been having a runny nose, cough and she is unsure if she has had a fever. She had sweats.   . Dizziness    She stated she had a fall due to being dizzy    HPI  Patient presents today to be evaluated for dizziness. She also stated she has been having some sinus symptoms. She has not discussed the dizziness with her neurologist.   URI  This is a chronic problem. The current episode started more than 1 year ago. The problem has been gradually worsening. There has been no fever (low grade at 99.1 here in office). Associated symptoms include headaches, sinus pain and a sore throat. Pertinent negatives include no congestion, coughing (mild), nausea, vomiting or wheezing. She has tried nothing for the symptoms.  Dizziness This is a chronic problem. The current episode started more than 1 month ago. The problem occurs intermittently. Associated symptoms include a fever, headaches and a sore throat. Pertinent negatives include no congestion, coughing (mild), nausea or vomiting.      Past Medical History:  Diagnosis Date  . Acid reflux   . Bleeding gastric ulcer   . Cellulitis   . Cervical back pain with evidence of disc disease   . Depression   . Dysuria   . FATIGUE   . Fibromyalgia   . FLANK PAIN,  RIGHT   . GOITER   . HEADACHES, HX OF   . Huntington's disease ([place])   . IBS (irritable bowel syndrome)   . Palpitations   . Post partum depression   . Seasonal allergies   . URTICARIA   . Vertigo   . Vitamin D deficiency      Family History  Problem Relation Age of Onset  . Hypertension Mother   . Huntington's disease Mother        deceased August 17, 2022  . Diabetes Father   . Hypertension Father   . Heart disease Father   . Scoliosis Sister   . Huntington's disease Sister   . Diabetes Brother   . Hypertension Brother   . Scoliosis Brother   . Skin cancer Maternal Grandmother      Current Outpatient Medications:  .  atorvastatin (LIPITOR) 20 MG tablet, TAKE 1 TABLET BY MOUTH ON MONDAY, WEDNESDAY, FRIDAY, Disp: 45 tablet, Rfl: 0 .  B Complex Vitamins (VITAMIN B COMPLEX PO), Take 1 tablet by mouth daily. , Disp: , Rfl:  .  Calcium Carb-Cholecalciferol (CALCIUM 1000 + D) 1000-800 MG-UNIT TABS, Take 1 tablet by mouth daily. , Disp: , Rfl:  .  Cholecalciferol (VITAMIN D3) 50 MCG (2000 UT) TABS, Take 2,000 Units by mouth daily. , Disp: , Rfl:  .  doxycycline (DORYX) 100 MG EC tablet, Take 1 tablet (100 mg total) by mouth 2 (two) times daily., Disp: 14  tablet, Rfl: 0 .  DULoxetine (CYMBALTA) 30 MG capsule, TAKE 2 CAPSULE BY MOUTH EVERY DAY, Disp: 180 capsule, Rfl: 0 .  fluticasone (FLONASE) 50 MCG/ACT nasal spray, Place 2 sprays into both nostrils daily., Disp: 16 g, Rfl: 2 .  melatonin 5 MG TABS, Take 5 mg by mouth at bedtime as needed (sleep aid)., Disp: , Rfl:  .  Multiple Vitamin (MULTIVITAMIN ADULT PO), Take 1 tablet by mouth daily. , Disp: , Rfl:  .  Multiple Vitamins-Minerals (ZINC PO), Take 50 mg by mouth daily. , Disp: , Rfl:  .  omeprazole (PRILOSEC) 40 MG capsule, TAKE 1 CAPSULE BY MOUTH EVERY DAY, Disp: 90 capsule, Rfl: 1 .  tetrabenazine (XENAZINE) 12.5 MG tablet, Take 1 tablet (12.5 mg total) by mouth 2 (two) times daily., Disp: 60 tablet, Rfl: 11 .  Wheat Dextrin (BENEFIBER  PO), Take 1 tablet by mouth daily., Disp: , Rfl:  .  doxycycline (MONODOX) 100 MG capsule, Take 1 capsule (100 mg total) by mouth 2 (two) times daily., Disp: 14 capsule, Rfl: 0   Allergies  Allergen Reactions  . Ibuprofen Other (See Comments)    "It intensifies my pain." Made pain and symptoms worse. "It intensifies my pain."  . Morphine Itching    Red blotches on skin and burning.  . Morphine And Related Itching and Other (See Comments)    Reaction burns Reaction burns  . Penicillin G Itching  . Penicillins Hives and Itching    Has patient had a PCN reaction causing immediate rash, facial/tongue/throat swelling, SOB or lightheadedness with hypotension: Yes Has patient had a PCN reaction causing severe rash involving mucus membranes or skin necrosis: No Has patient had a PCN reaction that required hospitalization No Has patient had a PCN reaction occurring within the last 10 years: No If all of the above answers are "NO", then may proceed with Cephalosporin use.   [name] Risperdal [Risperidone] Other (See Comments)    Confusion, oversedation   . Adhesive [Tape] Itching    Itching and burning at site Itching and burning at site  . Pseudoephedrine Other (See Comments) and Palpitations    Speed up heart rate Speed up heart rate  . Vitamin [Cyanocobalamin] Hives and Rash     Review of Systems  Constitutional: Positive for fever.  HENT: Positive for postnasal drip, sinus pain and sore throat. Negative for congestion.   Eyes: Negative.   Respiratory: Negative.  Negative for cough (mild) and wheezing.   Cardiovascular: Negative.   Gastrointestinal: Negative.  Negative for nausea and vomiting.  Endocrine: Negative.   Genitourinary: Negative.   Musculoskeletal: Negative.        Bilateral knee pain - she had a fall several months ago and fell on her knees. Her pain is worse when going up and down stairs  Skin: Negative.   Neurological: Positive for dizziness and headaches.   Psychiatric/Behavioral: Negative.      Today's Vitals   06/18/20 1055  Temp: 99.1 F (37.3 C)  TempSrc: Oral  Weight: 213 lb 6.4 oz (96.8 kg)  Height: 5' 7.2" (1.707 m)  PainSc: 0-No pain   Body mass index is 33.22 kg/m.   Objective:  Physical Exam Constitutional:      Appearance: Normal appearance. She is obese.  Cardiovascular:     Rate and Rhythm: Normal rate and regular rhythm.     Pulses: Normal pulses.     Heart sounds: Normal heart sounds.  Pulmonary:     Effort: Pulmonary effort is  normal.     Breath sounds: Normal breath sounds.  Abdominal:     General: Abdomen is flat. Bowel sounds are normal.     Palpations: Abdomen is soft.  Musculoskeletal:        General: Normal range of motion.     Cervical back: Normal range of motion and neck supple.  Skin:    General: Skin is warm and dry.     Capillary Refill: Capillary refill takes less than 2 seconds.  Neurological:     General: No focal deficit present.     Mental Status: She is alert and oriented to person, place, and time.  Psychiatric:        Mood and Affect: Mood normal.        Behavior: Behavior normal.        Thought Content: Thought content normal.        Judgment: Judgment normal.         Assessment And Plan:     1. Dizziness  She reports this as being persistent. I am unsure if this is related to her Huntington's disease vs migraines vs sinus infection. Will refer to neurology for further evaluation - Ambulatory referral to Neurology - Ambulatory referral to ENT  2. Sinus pressure  Will treat with doxcycycline has frontal sinus pressure  She is to also use flonase daily - doxycycline (DORYX) 100 MG EC tablet; Take 1 tablet (100 mg total) by mouth 2 (two) times daily.  Dispense: 14 tablet; Refill: 0 - fluticasone (FLONASE) 50 MCG/ACT nasal spray; Place 2 sprays into both nostrils daily.  Dispense: 16 g; Refill: 2  3. Other migraine without status migrainosus, not intractable  Reports a  past history of migraines, she has been sensitive to new medications and will refer to Neurology. She may benefit from monthly injections - Ambulatory referral to Neurology  4. Acute pain of both knees  Occurred after a fall, she is to use a pain cream likely related to inflammation  If persists will consider xrays or referral to orthopedics  5. Encounter for screening for COVID-19  She is encouraged to remain in isolation until she is found to be negative - Novel Coronavirus, [facility] ([facility]) - [place] COVID-19  6. Bilateral hearing loss, unspecified hearing loss type  Reports hearing loss, she has mild hearing difficulty with whisper test  No cerumen present to canal - Ambulatory referral to ENT  7. Encounter for screening mammogram for malignant neoplasm of breast  Pt instructed on Self Breast Exam.According to ACOG guidelines Women aged 42 and older are recommended to get an annual mammogram. Form completed and given to patient contact the [facility] for appointment scheduing.   Pt encouraged to get annual mammogram - MM Digital Screening; Future  8. Abnormal glucose  Chronic, stable  no current medications, diet controlled  Encouraged to limit intake of sugary foods and drinks  Encouraged to increase physical activity to 150 minutes per week - Hemoglobin A1c  9. Mixed hyperlipidemia  Chronic, tolerating statin 3 days a week - Lipid panel - CMP14+EGFR     Patient was given opportunity to ask questions. Patient verbalized understanding of the plan and was able to repeat key elements of the plan. All questions were answered to their satisfaction.  [name], FNP     I, [name], FNP, have reviewed all documentation for this visit. The documentation on 06/18/20 for the exam, diagnosis, procedures, and orders are all accurate and complete.   THE  PATIENT IS ENCOURAGED TO PRACTICE SOCIAL DISTANCING DUE TO THE COVID-19 PANDEMIC.

## 2020-06-19 ENCOUNTER — Other Ambulatory Visit: Payer: Self-pay

## 2020-06-19 LAB — LIPID PANEL
Chol/HDL Ratio: 3.1 ratio (ref 0.0–4.4)
Cholesterol, Total: 177 mg/dL (ref 100–199)
HDL: 58 mg/dL (ref >39)
LDL Chol Calc (NIH): 96 mg/dL (ref 0–99)
Triglycerides: 130 mg/dL (ref 0–149)
VLDL Cholesterol Cal: 23 mg/dL (ref 5–40)

## 2020-06-19 LAB — CMP14+EGFR
ALT: 14 IU/L (ref 0–32)
AST: 13 IU/L (ref 0–40)
Albumin/Globulin Ratio: 1.2 (ref 1.2–2.2)
Albumin: 4.1 g/dL (ref 3.8–4.9)
Alkaline Phosphatase: 157 IU/L — ABNORMAL HIGH (ref 44–121)
BUN/Creatinine Ratio: 9 (ref 9–23)
BUN: 9 mg/dL (ref 6–24)
Bilirubin Total: 0.4 mg/dL (ref 0.0–1.2)
CO2: 23 mmol/L (ref 20–29)
Calcium: 9.8 mg/dL (ref 8.7–10.2)
Chloride: 105 mmol/L (ref 96–106)
Creatinine, Ser: 0.99 mg/dL (ref 0.57–1.00)
GFR calc Af Amer: 75 mL/min/{1.73_m2} (ref >59)
GFR calc non Af Amer: 65 mL/min/{1.73_m2} (ref >59)
Globulin, Total: 3.4 g/dL (ref 1.5–4.5)
Glucose: 93 mg/dL (ref 65–99)
Potassium: 5.3 mmol/L — ABNORMAL HIGH (ref 3.5–5.2)
Sodium: 144 mmol/L (ref 134–144)
Total Protein: 7.5 g/dL (ref 6.0–8.5)

## 2020-06-19 LAB — NOVEL CORONAVIRUS, NAA: SARS-CoV-2, NAA: NOT DETECTED

## 2020-06-19 LAB — SARS-COV-2, NAA 2 DAY TAT

## 2020-06-19 LAB — HEMOGLOBIN A1C
Est. average glucose Bld gHb Est-mCnc: 123 mg/dL
Hgb A1c MFr Bld: 5.9 % — ABNORMAL HIGH (ref 4.8–5.6)

## 2020-06-19 MED ORDER — DOXYCYCLINE MONOHYDRATE 100 MG PO CAPS: 100.0000 mg | ORAL_CAPSULE | Freq: Two times a day (BID) | ORAL | 0 refills | Status: DC

## 2020-06-22 LAB — POC COVID19 BINAXNOW: SARS Coronavirus 2 Ag: NEGATIVE

## 2020-06-23 ENCOUNTER — Other Ambulatory Visit: Payer: Self-pay | Admitting: Nurse Practitioner

## 2020-06-23 DIAGNOSIS — R509 Fever, unspecified: Secondary | ICD-10-CM

## 2020-06-23 NOTE — Progress Notes (Signed)
She can go to [facility] imaging for a CXR, I have sent a referral to ENT for her throat/voice.

## 2020-06-27 DIAGNOSIS — Z880 Allergy status to penicillin: Secondary | ICD-10-CM | POA: Diagnosis not present

## 2020-06-27 DIAGNOSIS — Z885 Allergy status to narcotic agent status: Secondary | ICD-10-CM | POA: Diagnosis not present

## 2020-06-27 DIAGNOSIS — Z888 Allergy status to other drugs, medicaments and biological substances status: Secondary | ICD-10-CM | POA: Diagnosis not present

## 2020-06-27 DIAGNOSIS — K219 Gastro-esophageal reflux disease without esophagitis: Secondary | ICD-10-CM | POA: Diagnosis not present

## 2020-06-27 DIAGNOSIS — R0981 Nasal congestion: Secondary | ICD-10-CM | POA: Diagnosis not present

## 2020-06-27 DIAGNOSIS — Z9049 Acquired absence of other specified parts of digestive tract: Secondary | ICD-10-CM | POA: Diagnosis not present

## 2020-06-27 DIAGNOSIS — Z886 Allergy status to analgesic agent status: Secondary | ICD-10-CM | POA: Diagnosis not present

## 2020-07-07 ENCOUNTER — Other Ambulatory Visit: Payer: Self-pay | Admitting: Nurse Practitioner

## 2020-07-07 ENCOUNTER — Other Ambulatory Visit: Payer: Self-pay

## 2020-07-10 ENCOUNTER — Other Ambulatory Visit: Payer: Self-pay | Admitting: Nurse Practitioner

## 2020-07-10 ENCOUNTER — Telehealth: Payer: Self-pay

## 2020-07-10 DIAGNOSIS — J3489 Other specified disorders of nose and nasal sinuses: Secondary | ICD-10-CM

## 2020-07-10 MED ORDER — AZELASTINE HCL 0.1 % NA SOLN: 2.0000 | Freq: Two times a day (BID) | NASAL | 2 refills | Status: DC

## 2020-07-10 NOTE — Telephone Encounter (Signed)
I called pt and left her a vm asking for her to call the office. Katherine Gregory put in a order for her to have an xray of her sinuses and also she sent meds to the pharmacy for her. YL,RMA

## 2020-07-12 ENCOUNTER — Other Ambulatory Visit: Payer: Self-pay | Admitting: Neurology

## 2020-07-14 ENCOUNTER — Other Ambulatory Visit: Payer: Self-pay | Admitting: Nurse Practitioner

## 2020-07-14 DIAGNOSIS — F331 Major depressive disorder, recurrent, moderate: Secondary | ICD-10-CM

## 2020-07-15 ENCOUNTER — Other Ambulatory Visit: Payer: Self-pay

## 2020-07-15 DIAGNOSIS — F331 Major depressive disorder, recurrent, moderate: Secondary | ICD-10-CM

## 2020-07-15 MED ORDER — DULOXETINE HCL 30 MG PO CPEP: ORAL_CAPSULE | ORAL | 1 refills | Status: AC

## 2020-07-24 ENCOUNTER — Other Ambulatory Visit: Payer: Self-pay | Admitting: Nurse Practitioner

## 2020-08-06 ENCOUNTER — Ambulatory Visit: Payer: Medicare Other | Admitting: Nurse Practitioner

## 2020-08-06 ENCOUNTER — Ambulatory Visit: Payer: Medicare HMO

## 2020-08-08 ENCOUNTER — Telehealth: Payer: Medicare Other

## 2020-08-21 ENCOUNTER — Ambulatory Visit: Payer: Medicare Other | Admitting: Neurology

## 2020-08-21 NOTE — Progress Notes (Deleted)
PATIENT: Katherine Gregory DOB: March 31, 1966  No chief complaint on file.    HISTORICAL  Lucill Neala Miggins, seen in request for Huntington's disease,  I saw her initially in February 2016, referred by her primary care physician Dr. Zollie Pee for Hungtingons disease   She was diagnosed with Huntington's disease by [facility] neurologist Dr. Evelena Leyden in November 2014, based on her symptoms, positive family history, and genetic testing,  Her mother suffered Huntington's disease, diagnosis was made at age 895, passed away at age 52 in Aug 15, 2013, her younger sister at age 19 was also diagnosed with Huntington's disease, her mother has 7 children, 4 girls, 3 boys.  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 Gilford Rile at yearly basis, hope to follow-up with a local neurologist to address her needs.  The most bothersome symptoms for her is chorea movement , especially her abnormal neck jerking movement, gait difficulty, she also has depression, Zoloft has been very helpful.  We have tried to Haldol 1 mg up to 3 times daily briefly in 2016, with limited help, then she decided to continue follow-up with Dr. Gilford Rile at [facility],  In 2018, she complains of worsening depression, was treated with Zoloft 50 mg one and half tablets daily, trazodone 50 mg at that time, Cymbalta 60 mg daily, Valium 2 mg as needed, Risperdal 2 mg twice a day,   I reviewed [facility] record in 2015, positive ANA, with titer of 1-160, diffuse pattern, negative RPR, ESR 41, normal CPK 28, TSH 2.6, vitamin B12  241, C-reactive protein 16.8, lactic acid 0.8, Ace level was normal 21, there was [place] genetic testing in 2015 mentioned, but I do not have detailed report.  I also reviewed and summarzied records from Dr. Gilford Rile dated January 30 2014, Huntington's genetic testing repeat 42,  MRI of the brain in 2014 was reported normal  She moved to [place], then to [place], just came back to [place] in July 2020  At [place], she was seen by a local neurologist Dr. Lenny Pastel, was started on tetrabenazine 12.5 mg twice daily, which has helped her chorea movement,  She now lives with her daughter, 15 years old, no signs of Huntington's disease, patient is no longer driving, last tried was in 2018, she has unsteady gait, but ambulate without assistant  Update August 21, 2020 SS:   REVIEW OF SYSTEMS: Full 14 system review of systems performed and notable only for as above All other review of systems were negative.  ALLERGIES: Allergies  Allergen Reactions  . Ibuprofen Other (See Comments)    "It intensifies my pain." Made pain and symptoms worse. "It intensifies my pain."  . Morphine Itching    Red blotches on skin and burning.  . Morphine And Related Itching and Other (See Comments)    Reaction burns Reaction burns  . Penicillin G Itching  . Penicillins Hives and Itching    Has patient had a PCN reaction causing immediate rash, facial/tongue/throat swelling, SOB or lightheadedness with hypotension: Yes Has patient had a PCN reaction causing severe rash involving mucus membranes or skin necrosis: No Has  patient had a PCN reaction that required hospitalization No Has patient had a PCN reaction occurring within the last 10 years: No If all of the above answers are "NO", then may proceed with Cephalosporin use.   [name] Risperdal [Risperidone] Other (See Comments)    Confusion, oversedation   . Adhesive [Tape] Itching    Itching and burning at site Itching and burning at site  .  Pseudoephedrine Other (See Comments) and Palpitations    Speed up heart rate Speed up heart rate  . Vitamin [Cyanocobalamin] Hives and Rash    HOME MEDICATIONS: Current Outpatient Medications  Medication Sig Dispense Refill  . atorvastatin (LIPITOR) 20 MG tablet TAKE 1 TABLET BY MOUTH ON MONDAY, WEDNESDAY, FRIDAY 45 tablet 0  . azelastine (ASTELIN) 0.1 % nasal spray Place 2 sprays into both nostrils 2 (two) times daily. Use in each nostril as directed 30 mL 2  . B Complex Vitamins (VITAMIN B COMPLEX PO) Take 1 tablet by mouth daily.     . Calcium Carb-Cholecalciferol (CALCIUM 1000 + D) 1000-800 MG-UNIT TABS Take 1 tablet by mouth daily.     . Cholecalciferol (VITAMIN D3) 50 MCG (2000 UT) TABS Take 2,000 Units by mouth daily.     . DULoxetine (CYMBALTA) 30 MG capsule TAKE 1 CAPSULE BY MOUTH TWICE DAILY 180 capsule 1  . fluticasone (FLONASE) 50 MCG/ACT nasal spray Place 2 sprays into both nostrils daily. 16 g 2  . melatonin 5 MG TABS Take 5 mg by mouth at bedtime as needed (sleep aid).    . Multiple Vitamin (MULTIVITAMIN ADULT PO) Take 1 tablet by mouth daily.     . Multiple Vitamins-Minerals (ZINC PO) Take 50 mg by mouth daily.     [name] omeprazole (PRILOSEC) 40 MG capsule TAKE 1 CAPSULE BY MOUTH EVERY DAY 90 capsule 1  . tetrabenazine (XENAZINE) 12.5 MG tablet TAKE 1 TABLET BY MOUTH TWICE DAILY 60 tablet 1  . Wheat Dextrin (BENEFIBER PO) Take 1 tablet by mouth daily.     No current facility-administered medications for this visit.    PAST MEDICAL HISTORY: Past Medical History:  Diagnosis Date  . Acid reflux   . Bleeding gastric ulcer   . Cellulitis   . Cervical back pain with evidence of disc disease   . Depression   . Dysuria   . FATIGUE   . Fibromyalgia   . FLANK PAIN, RIGHT   . GOITER   . HEADACHES, HX OF   . Huntington's disease ([place])   . IBS (irritable bowel syndrome)   . Palpitations   . Post partum depression   . Seasonal allergies   . URTICARIA   . Vertigo   .  Vitamin D deficiency     PAST SURGICAL HISTORY: Past Surgical History:  Procedure Laterality Date  . CARDIAC CATHETERIZATION     05/17/12 / waiting on results  . CARTILAGE SURGERY     RT wrist  . CESAREAN SECTION  1988 & 2008   x 2  . CHOLECYSTECTOMY    . DILITATION & CURRETTAGE/HYSTROSCOPY WITH HYDROTHERMAL ABLATION N/A 01/24/2015   Procedure: DILATATION & CURETTAGE/HYSTEROSCOPY WITH HYDROTHERMAL ABLATION;  Surgeon: Shelly Bombard, MD;  Location: [facility];  Service: Gynecology;  Laterality: N/A;  Elberta Fortis will be here  . WISDOM TOOTH EXTRACTION      FAMILY HISTORY: Family History  Problem Relation Age of Onset  . Hypertension Mother   . Huntington's disease Mother        deceased 08/12/2022  .  Diabetes Father   . Hypertension Father   . Heart disease Father   . Scoliosis Sister   . Huntington's disease Sister   . Diabetes Brother   . Hypertension Brother   . Scoliosis Brother   . Skin cancer Maternal Grandmother     SOCIAL HISTORY: Social History   Socioeconomic History  . Marital status: Single    Spouse name: Not on file  . Number of children: 2  . Years of education: some college  . Highest education level: Not on file  Occupational History  . Occupation: Disabled  Tobacco Use  . Smoking status: Never Smoker  . Smokeless tobacco: Never Used  Substance and Sexual Activity  . Alcohol use: Not Currently    Comment: seldom  . Drug use: No  . Sexual activity: Not Currently    Birth control/protection: Surgical    Comment: Tubal Ligation   Other Topics Concern  . Not on file  Social History Narrative   Patient lives at home with a family member. Patient is single.   Disabled.   Education some college.   Left handed.   Caffeine some times not daily.   Social Determinants of Health   Financial Resource Strain: Not on file  Food Insecurity: Not on file  Transportation Needs: Not on file  Physical Activity: Not on file  Stress: Not on file  Social  Connections: Not on file  Intimate Partner Violence: Not on file     PHYSICAL EXAM   There were no vitals filed for this visit. Not recorded     There is no height or weight on file to calculate BMI.  PHYSICAL EXAMNIATION:  Gen: NAD, conversant, well nourised, well groomed                     Cardiovascular: Regular rate rhythm, no peripheral edema, warm, nontender. Eyes: Conjunctivae clear without exudates or hemorrhage Neck: Supple, no carotid bruits. Pulmonary: Clear to auscultation bilaterally   NEUROLOGICAL EXAM:  MENTAL STATUS: Speech:    Speech is normal; fluent and spontaneous with normal comprehension.  Cognition:     Orientation to time, place and person     Normal recent and remote memory     Normal Attention span and concentration     Normal Language, naming, repeating,spontaneous speech     Fund of knowledge   CRANIAL NERVES: Mild mouth chorea movement, CN II: Visual fields are full to confrontation. Pupils are round equal and briskly reactive to light. CN III, IV, VI: extraocular movement are normal. No ptosis. CN V: Facial sensation is intact to light touch CN VII: Face is symmetric with normal eye closure  CN VIII: Hearing is normal to causal conversation. CN IX, X: Phonation is normal. CN XI: Head turning and shoulder shrug are intact  MOTOR: She has occasionally large amplitude chorea movement,  REFLEXES: Reflexes are 2+ and symmetric at the biceps, triceps, knees, and ankles. Plantar responses are flexor.  SENSORY: Intact to light touch, pinprick and vibratory sensation are intact in fingers and toes.  COORDINATION: There is no trunk or limb dysmetria noted.  GAIT/STANCE: Wide-based, cautious, stiff, mildly unsteady gait Romberg is absent.   DIAGNOSTIC DATA (LABS, IMAGING, TESTING) - I reviewed patient records, labs, notes, testing and imaging myself where available.   ASSESSMENT AND PLAN  Nozomi Lorrayne Ismael is a 55 y.o. female    Huntington's disease,  Strong family history, her mother, and younger sister suffered Huntington's disease,  Genetic  proven, CAG repeating 42  Continue tetrabenazine 12.5 mg twice a day  Return to clinic in 6 6 months with nurse practitioner Gertie Broerman Depression  She has pending appointment with her psychiatrist  Total time spent reviewing the chart, obtaining history, examined patient, ordering tests, documentation, consultations and family, care coordination was 58 minutes    Marcial Pacas, M.D. Ph.D.  [facility] [address] [place], [place] [address] Ph: [phone] Fax: [phone]  CC: Referring Provider

## 2020-09-09 ENCOUNTER — Telehealth: Payer: Self-pay | Admitting: Neurology

## 2020-09-09 ENCOUNTER — Telehealth: Payer: Self-pay | Admitting: Nurse Practitioner

## 2020-09-09 ENCOUNTER — Ambulatory Visit: Payer: Medicare Other | Admitting: Nurse Practitioner

## 2020-09-09 ENCOUNTER — Other Ambulatory Visit: Payer: Self-pay | Admitting: Neurology

## 2020-09-09 NOTE — Telephone Encounter (Signed)
Pt called stating that she is in [place], [place] at the moment and she is wanting to know if her tetrabenazine (XENAZINE) 12.5 MG tablet can be called in to the [facility] in  [place] [phone]

## 2020-09-09 NOTE — Telephone Encounter (Signed)
I returned the call to the patient. I left her a message letting her know the refills have been sent to the requested pharmacy. Last seen 06/19/2019. I reminded her to keep her pending appt on 11/05/20 to continue refills. If she has relocated to [place] permanently, then she will need to get her new physician to manage this medication.

## 2020-09-09 NOTE — Telephone Encounter (Signed)
Called patient to reschedule appt no answer left VM   

## 2020-09-10 ENCOUNTER — Ambulatory Visit: Payer: Medicare Other

## 2020-09-12 ENCOUNTER — Telehealth: Payer: Medicare Other

## 2020-09-12 ENCOUNTER — Telehealth: Payer: Self-pay

## 2020-09-12 NOTE — Telephone Encounter (Signed)
  Chronic Care Management   Outreach Note  09/12/2020 Name: Katherine Gregory MRN: [id number] DOB: 1965/08/09  Referred by: Arnette Felts, FNP Reason for referral : Chronic Care Management (RN CM FU Call Attempt )   An unsuccessful telephone outreach was attempted today. The patient was referred to the case management team for assistance with care management and care coordination.   Follow Up Plan: A HIPAA compliant phone message was left for the patient providing contact information and requesting a return call.  The care management team will reach out to the patient again over the next 30-45 days.   Delsa Sale, RN, BSN, CCM Care Management Coordinator [facility] Care Management/[facility]  Direct Phone: [phone]

## 2020-09-16 ENCOUNTER — Other Ambulatory Visit: Payer: Self-pay | Admitting: Nurse Practitioner

## 2020-09-16 DIAGNOSIS — E782 Mixed hyperlipidemia: Secondary | ICD-10-CM

## 2020-09-24 DIAGNOSIS — R079 Chest pain, unspecified: Secondary | ICD-10-CM | POA: Diagnosis not present

## 2020-09-24 DIAGNOSIS — R072 Precordial pain: Secondary | ICD-10-CM | POA: Diagnosis not present

## 2020-09-29 DIAGNOSIS — R0789 Other chest pain: Secondary | ICD-10-CM | POA: Diagnosis not present

## 2020-10-01 ENCOUNTER — Ambulatory Visit (INDEPENDENT_AMBULATORY_CARE_PROVIDER_SITE_OTHER): Payer: Medicare Other | Admitting: Otolaryngology

## 2020-10-03 ENCOUNTER — Other Ambulatory Visit: Payer: Self-pay | Admitting: Nurse Practitioner

## 2020-10-04 ENCOUNTER — Other Ambulatory Visit: Payer: Self-pay | Admitting: Nurse Practitioner

## 2020-10-08 ENCOUNTER — Other Ambulatory Visit: Payer: Self-pay | Admitting: Nurse Practitioner

## 2020-10-08 DIAGNOSIS — J3489 Other specified disorders of nose and nasal sinuses: Secondary | ICD-10-CM

## 2020-10-09 ENCOUNTER — Telehealth: Payer: Self-pay | Admitting: Neurology

## 2020-10-09 MED ORDER — TETRABENAZINE 12.5 MG PO TABS: 12.5000 mg | ORAL_TABLET | Freq: Two times a day (BID) | ORAL | 0 refills | Status: DC

## 2020-10-09 NOTE — Telephone Encounter (Signed)
I returned her call. She has pending appt on 11/05/20. She should be back from [place] by then. She has been on an extended stay with her sister. States she has enough medication to last her until the follow up.

## 2020-10-09 NOTE — Telephone Encounter (Signed)
The patient called back. She has now confirmed her flight home on 11/15/20. She needs to reschedule. She now has a pending appt on 11/17/20. She is aware she must keep this follow up to continue refills. One additional 30-day supply sent to the pharmacy in [place] to avoid disruption to her treatment.

## 2020-10-09 NOTE — Telephone Encounter (Signed)
Pt called, if cannot get back for my appt on 9/29 can you give me another refill for tetrabenazine (XENAZINE) 12.5 MG tablet. Would like a call from the nurse.

## 2020-10-14 ENCOUNTER — Telehealth: Payer: Self-pay

## 2020-10-14 ENCOUNTER — Telehealth: Payer: Medicare Other

## 2020-10-14 NOTE — Telephone Encounter (Signed)
  Care Management   Follow Up Note   10/14/2020 Name: Katherine Gregory MRN: [id number] DOB: 1965/08/21   Referred by: Arnette Felts, FNP Reason for referral : Chronic Care Management (RNCM Follow up call - 2nd call attempt)   An unsuccessful telephone outreach was attempted today. The patient was referred to the case management team for assistance with care management and care coordination.   Follow Up Plan: A HIPPA compliant phone message was left for the patient providing contact information and requesting a return call.   Delsa Sale, RN, BSN, CCM Care Management Coordinator [facility] Care Management/[facility]  Direct Phone: [phone]

## 2020-10-14 NOTE — Telephone Encounter (Signed)
Error

## 2020-11-05 ENCOUNTER — Ambulatory Visit: Payer: Medicare Other | Admitting: Neurology

## 2020-11-06 ENCOUNTER — Other Ambulatory Visit: Payer: Self-pay | Admitting: Nurse Practitioner

## 2020-11-06 DIAGNOSIS — J3489 Other specified disorders of nose and nasal sinuses: Secondary | ICD-10-CM

## 2020-11-17 ENCOUNTER — Ambulatory Visit: Payer: Medicare Other | Admitting: Neurology

## 2020-11-17 ENCOUNTER — Encounter: Payer: Self-pay | Admitting: Neurology

## 2020-11-17 ENCOUNTER — Telehealth: Payer: Self-pay

## 2020-11-17 VITALS — BP 130/85 | HR 90

## 2020-11-17 DIAGNOSIS — R269 Unspecified abnormalities of gait and mobility: Secondary | ICD-10-CM | POA: Diagnosis not present

## 2020-11-17 DIAGNOSIS — G10 Huntington's disease: Secondary | ICD-10-CM | POA: Diagnosis not present

## 2020-11-17 MED ORDER — TETRABENAZINE 12.5 MG PO TABS: ORAL_TABLET | ORAL | 3 refills | Status: AC

## 2020-11-17 NOTE — Progress Notes (Signed)
PATIENT: Katherine Gregory DOB: 1966/01/11  Chief Complaint  Patient presents with   Follow-up    Rm 16, alone, states refills needed for  tetrabenazine, reports jerking in right arm and both legs, medication is not working as well, c/o breathing heavier, moaning more, states headaches are worse, difficulty sleeping      HISTORICAL  Katherine Gregory, seen in request for Huntington's disease,  I saw her initially in February 2016, referred by her primary care physician Dr. Zollie Gregory for Hungtingons disease    She was diagnosed with Huntington's disease by [facility] neurologist Dr. Evelena Gregory in November 2014, based on her symptoms, positive family history, and genetic testing,   Her mother suffered Huntington's disease, diagnosis was made at age 242, passed away at age 60 in 08/15/13, her younger sister at age 13 was also diagnosed with Huntington's disease, her mother has 7 children, 4 girls, 3 boys.  Katherine Gregory lives with her sister, she has a daughter at age 40, son age 7, grand daughter age 24    She become symptomatic around 2013, she noticed extreme fatigue, eventually has to quit her job because of fatigue, initially she was diagnosed with fibromyalgia, later she developed chorea movement, gait difficulty, recent few weeks, she also developed uncontrollable neck jerking movement, which has been very bothersome,   She is followed by Dr. Gilford Gregory at yearly basis, hope to follow-up with a local neurologist to address her needs.   The most bothersome symptoms for her is chorea movement , especially her abnormal neck jerking movement, gait difficulty, she also has depression, Zoloft has been very helpful.   We have tried to Haldol 1 mg up to 3 times daily briefly in 2016, with limited help, then she decided to continue follow-up with Dr. Gilford Gregory at [facility],  In 2018, she complains of worsening depression, was treated with Zoloft 50 mg one and half tablets daily,  trazodone 50 mg at that time, Cymbalta 60 mg daily, Valium 2 mg as needed, Risperdal 2 mg twice a day,    I reviewed [facility] record in 2015, positive ANA, with titer of 1-160, diffuse pattern, negative RPR, ESR 41, normal CPK 28, TSH 2.6, vitamin B12 241, C-reactive protein 16.8, lactic acid 0.8, Ace level was normal 21, there was [place] genetic testing in 2015 mentioned, but I do not have detailed report.   I also reviewed and summarzied records from Dr. Gilford Gregory dated January 30 2014, Huntington's genetic testing repeat 42,  MRI of the brain in 2014 was reported normal  She moved to [place], then to [place], just came back to [place] in July 2020  At [place], she was seen by a local neurologist Dr. Lenny Gregory, was started on tetrabenazine 12.5 mg twice daily, which has helped her chorea movement,  She now lives with her daughter, 34 years old, no signs of Huntington's disease, patient is no longer driving, last tried was in 2018, she has unsteady gait, but ambulate without assistant  Update November 17, 2020 SS: Lives here with her brother, she is considering to moving [place], she just got back, apparently let her health go while in [place], now back and a lot of issues. Last seen in Feb 2021, remains on Tetrabenazine 12.5 mg twice daily (10 AM, 10 PM). Has a list: Feels breathing is heavy, SOB with walking, CP, coughing when she laughs. 1 month ago, fell on wet surface, more knee pain bilaterally, worse on right. Vocal moaning is more. Feels  chorea increased in right wrist and hand. Most concerning symptom to her is moaning (not noted today). Taking melatonin, not sleeping. Difficulty prioritizing and focusing, lack energy. Walks independent but in wheelchair today due to knee pain. Knee brace for knee?  REVIEW OF SYSTEMS: Full 14 system review of systems performed and notable only for as above  See HPI  ALLERGIES: Allergies  Allergen Reactions   Ibuprofen Other (See Comments)    "It  intensifies my pain." Made pain and symptoms worse. "It intensifies my pain."   Morphine Itching    Red blotches on skin and burning.   Morphine And Related Itching and Other (See Comments)    Reaction burns Reaction burns   Penicillin G Itching   Penicillins Hives and Itching    Has patient had a PCN reaction causing immediate rash, facial/tongue/throat swelling, SOB or lightheadedness with hypotension: Yes Has patient had a PCN reaction causing severe rash involving mucus membranes or skin necrosis: No Has patient had a PCN reaction that required hospitalization No Has patient had a PCN reaction occurring within the last 10 years: No If all of the above answers are "NO", then may proceed with Cephalosporin use.    Risperdal [Risperidone] Other (See Comments)    Confusion, oversedation    Adhesive [Tape] Itching    Itching and burning at site Itching and burning at site   Pseudoephedrine Other (See Comments) and Palpitations    Speed up heart rate Speed up heart rate   Vitamin [Cyanocobalamin] Hives and Rash    HOME MEDICATIONS: Current Outpatient Medications  Medication Sig Dispense Refill   atorvastatin (LIPITOR) 20 MG tablet TAKE 1 TABLET BY MOUTH ON MONDAY, WEDNESDAY AND FRIDAY 45 tablet 0   azelastine (ASTELIN) 0.1 % nasal spray PLACE 2 SPRAYS INTO BOTH NOSTRILS 2 (TWO) TIMES DAILY. USE IN EACH NOSTRIL AS DIRECTED 30 mL 1   B Complex Vitamins (VITAMIN B COMPLEX PO) Take 1 tablet by mouth daily.      Calcium Carb-Cholecalciferol (CALCIUM 1000 + D) 1000-800 MG-UNIT TABS Take 1 tablet by mouth daily.      Cholecalciferol (VITAMIN D3) 50 MCG (2000 UT) TABS Take 2,000 Units by mouth daily.      DULoxetine (CYMBALTA) 30 MG capsule TAKE 1 CAPSULE BY MOUTH TWICE DAILY 180 capsule 1   fluticasone (FLONASE) 50 MCG/ACT nasal spray Place 2 sprays into both nostrils daily. 16 g 2   melatonin 5 MG TABS Take 5 mg by mouth at bedtime as needed (sleep aid).     mometasone (NASONEX) 50  MCG/ACT nasal spray SHAKE LIQUID AND USE 2 SPRAYS IN EACH NOSTRIL DAILY 51 each 1   Multiple Vitamin (MULTIVITAMIN ADULT PO) Take 1 tablet by mouth daily.      Multiple Vitamins-Minerals (ZINC PO) Take 50 mg by mouth daily.      omeprazole (PRILOSEC) 40 MG capsule TAKE 1 CAPSULE BY MOUTH EVERY DAY 90 capsule 3   Wheat Dextrin (BENEFIBER PO) Take 1 tablet by mouth daily.     tetrabenazine (XENAZINE) 12.5 MG tablet Increase to 25 mg in the morning, take 12.5 mg in the evening 90 tablet 3   No current facility-administered medications for this visit.    PAST MEDICAL HISTORY: Past Medical History:  Diagnosis Date   Acid reflux    Bleeding gastric ulcer    Cellulitis    Cervical back pain with evidence of disc disease    Depression    Dysuria    FATIGUE  Fibromyalgia    FLANK PAIN, RIGHT    GOITER    HEADACHES, HX OF    Huntington's disease (HCC)    IBS (irritable bowel syndrome)    Palpitations    Post partum depression    Seasonal allergies    URTICARIA    Vertigo    Vitamin D deficiency     PAST SURGICAL HISTORY: Past Surgical History:  Procedure Laterality Date   CARDIAC CATHETERIZATION     05/17/12 / waiting on results   CARTILAGE SURGERY     RT wrist   [place] & 2008   x 2   CHOLECYSTECTOMY     DILITATION & CURRETTAGE/HYSTROSCOPY WITH HYDROTHERMAL ABLATION N/A 01/24/2015   Procedure: DILATATION & CURETTAGE/HYSTEROSCOPY WITH HYDROTHERMAL ABLATION;  Surgeon: Shelly Bombard, MD;  Location: [facility];  Service: Gynecology;  Laterality: N/A;  Elberta Fortis will be here   WISDOM TOOTH EXTRACTION      FAMILY HISTORY: Family History  Problem Relation Age of Onset   Hypertension Mother    Huntington's disease Mother        deceased 08-06-22   Diabetes Father    Hypertension Father    Heart disease Father    Scoliosis Sister    Huntington's disease Sister    Diabetes Brother    Hypertension Brother    Scoliosis Brother    Skin cancer Maternal Grandmother      SOCIAL HISTORY: Social History   Socioeconomic History   Marital status: Single    Spouse name: Not on file   Number of children: 2   Years of education: some college   Highest education level: Not on file  Occupational History   Occupation: Disabled  Tobacco Use   Smoking status: Never   Smokeless tobacco: Never  Substance and Sexual Activity   Alcohol use: Not Currently    Comment: seldom   Drug use: No   Sexual activity: Not Currently    Birth control/protection: Surgical    Comment: Tubal Ligation   Other Topics Concern   Not on file  Social History Narrative   Patient lives at home with a family member. Patient is single.   Disabled.   Education some college.   Left handed.   Caffeine some times not daily.   Social Determinants of Health   Financial Resource Strain: Not on file  Food Insecurity: Not on file  Transportation Needs: Not on file  Physical Activity: Not on file  Stress: Not on file  Social Connections: Not on file  Intimate Partner Violence: Not on file    PHYSICAL EXAM   Vitals:   11/17/20 1504  BP: 130/85  Pulse: 90    Not recorded     There is no height or weight on file to calculate BMI.  Physical Exam  General: The patient is alert and cooperative at the time of the examination. Short of breath with talking.  Pulmonary: Lungs are clear bilaterally  Skin: No significant peripheral edema is noted. Swelling to right knee.  Neurologic Exam  Mental status: The patient is alert and oriented x 3 at the time of the examination. The patient has apparent normal recent and remote memory, with an apparently normal attention span and concentration ability.  Cranial nerves: Facial symmetry is present. Speech is normal, no aphasia or dysarthria is noted. Extraocular movements are full. Visual fields are full.  Motor: The patient has good strength in all 4 extremities. Occasional chorea to right extremity noted, no  abnormal vocal sounds  noted. Fidgety in wheelchair  Sensory examination: Soft touch sensation is symmetric on the face, arms, and legs.  Coordination: The patient has good finger-nose-finger and heel-to-shin bilaterally.  Gait and station: In wheelchair, was able to stand slowly, but could not bear weight due to reported knee pain  Reflexes: Deep tendon reflexes are symmetric.  DIAGNOSTIC DATA (LABS, IMAGING, TESTING) - I reviewed patient records, labs, notes, testing and imaging myself where available.   ASSESSMENT AND PLAN  Katherine Gregory is a 55 y.o. female   Huntington's disease Multiple complaints (knee pain, shortness of breath, trouble sleeping, fatigue)  -Will try higher dose of tetrabenazine, 25 mg in AM, keep 12.5 mg PM (with current dose of 12.5 mg BID feels more chorea, vocal sounds), any higher dosing will require genotyping to determine CYP2D6 expression for patients requiring doses >50 mg/day -Strong family history, her mother, and younger sister suffered Huntington's disease, -Genetic proven, CAG repeating 84 -Referral to orthopedics for bilateral knee pain, seems to be issue affecting ambulation -Has just returned from [place], has had an extended visit, seems to have let her routine maintenance care lapse, needs to see her PCP, not clear the shortness of breath, CP is related to HD -Since increasing tetrabenazine, will have her follow-up with Dr. Krista Blue in about 3 months to determine benefit, call for any worsening symptoms   I spent 35 minutes of face-to-face and non-face-to-face time with patient.  This included previsit chart review, lab review, study review, order entry, discussing medication adjustment, management, and follow-up.  Evangeline Dakin, DNP  [facility] [address] [place], [place] [address] [phone]

## 2020-11-17 NOTE — Telephone Encounter (Signed)
Referral to orthopedics sent to [facility]. P: [phone].

## 2020-11-17 NOTE — Patient Instructions (Signed)
Increase tetrabenazine to 25 mg in the morning, keep 12.5 mg in the evening  Referral to orthopedics for knee pain  Need to see primary care See you back in 2-3 months with Dr. [name]

## 2020-11-19 ENCOUNTER — Ambulatory Visit (INDEPENDENT_AMBULATORY_CARE_PROVIDER_SITE_OTHER): Payer: Medicare Other | Admitting: Family Medicine

## 2020-11-19 ENCOUNTER — Ambulatory Visit: Payer: Self-pay

## 2020-11-19 DIAGNOSIS — M25561 Pain in right knee: Secondary | ICD-10-CM | POA: Diagnosis not present

## 2020-11-19 DIAGNOSIS — M25562 Pain in left knee: Secondary | ICD-10-CM | POA: Diagnosis not present

## 2020-11-19 MED ORDER — CELECOXIB 100 MG PO CAPS: 100.0000 mg | ORAL_CAPSULE | Freq: Two times a day (BID) | ORAL | 3 refills | Status: AC | PRN

## 2020-11-19 NOTE — Patient Instructions (Signed)
   Glucosamine Sulfate:  1,000 mg twice daily  Turmeric:  500 mg twice daily   

## 2020-11-19 NOTE — Progress Notes (Signed)
Office Visit Note   Patient: Katherine Gregory           Date of Birth: 01-31-1966           MRN: [id number] Visit Date: 11/19/2020 Requested by: Glean Salvo, NP [address] [place],  [place] [address] PCP: Irena Reichmann, DO  Subjective: Chief Complaint  Patient presents with   Right Knee - Pain    Larey Seat forward, hitting both knees on linoleum floor 1 month ago. Pain is worse in the right. Has had swelling in both knees. Worsening problems with going up/down stairs. Normally ambulates without assistance -- in wheelchair here today.    Left Knee - Pain    HPI: She is here with bilateral knee pain.  She fell about a month ago, landing directly on the front of her left knee and with the right knee fully extended.  She was able to get up and walk, and had quite a bit of pain at first then she seemed to get better over the next couple weeks but now the pain seems to be getting worse again especially in the right knee.  Both knees hurt on the anterior aspect.  She has a history of Huntington's disease monitored by neurology.  She was scheduled to be referred here anyway due to stiffness in her gait.                ROS:   All other systems were reviewed and are negative.  Objective: Vital Signs: There were no vitals taken for this visit.  Physical Exam:  General:  Alert and oriented, in no acute distress. Pulm:  Breathing unlabored. Psy:  Normal mood, congruent affect. Skin: No bruising Knees: No effusion in either knee today.  Ligaments feel stable.  Both knees are moderately tender near the patellofemoral joint.  No palpable click with McMurray's.    Imaging: XR Knee 1-2 Views Left  Result Date: 11/19/2020 X-rays reveal mild patellofemoral spurring in both knees.  Tibiofemoral joint spaces are well-preserved.  No sign of stress fracture.  XR Knee 1-2 Views Right  Result Date: 11/19/2020 X-rays reveal mild patellofemoral spurring in both knees.  Tibiofemoral joint  spaces are well-preserved.  No sign of stress fracture.   Assessment & Plan: Bilateral knee pain, suspect due to patellofemoral DJD. -Celebrex as needed, glucosamine and turmeric.  Avoid sugar.  Straight leg raises for strengthening.  Could contemplate injection if symptoms persist.     Procedures: No procedures performed        PMFS History: Patient Active Problem List   Diagnosis Date Noted   Gait abnormality 06/19/2019   Polypharmacy 06/17/2016   BMI 30.0-30.9,adult 05/14/2016   Benign paroxysmal positional vertigo 04/28/2016   Insomnia 04/28/2016   Depression, major, recurrent (HCC) 04/28/2016   Vitamin D deficiency 04/28/2016   Polyp at cervical os 01/24/2015   Huntington's disease (HCC) 06/20/2014   Abnormal uterine bleeding 11/16/2013   Bleeding gastric ulcer    Cervical back pain with evidence of disc disease    Chest pain 05/16/2012   Dizziness 05/16/2012   Abdominal pain 05/16/2012   Atypical chest pain 04/21/2012   Hot flashes 03/10/2012   Fibromyalgia 03/10/2012   Overweight(278.02) 01/24/2012   Borderline systolic HTN 11/25/2011   FLANK PAIN, RIGHT 10/26/2007   Fatigue 10/19/2007   DYSURIA 10/19/2007   ALLERGIC RHINITIS 08/17/2007   GOITER 07/25/2007   GERD 07/25/2007   PALPITATIONS 07/25/2007   HEADACHES, HX OF 07/25/2007  Past Medical History:  Diagnosis Date   Acid reflux    Bleeding gastric ulcer    Cellulitis    Cervical back pain with evidence of disc disease    Depression    Dysuria    FATIGUE    Fibromyalgia    FLANK PAIN, RIGHT    GOITER    HEADACHES, HX OF    Huntington's disease (HCC)    IBS (irritable bowel syndrome)    Palpitations    Post partum depression    Seasonal allergies    URTICARIA    Vertigo    Vitamin D deficiency     Family History  Problem Relation Age of Onset   Hypertension Mother    Huntington's disease Mother        deceased 08-16-22   Diabetes Father    Hypertension Father    Heart disease Father     Scoliosis Sister    Huntington's disease Sister    Diabetes Brother    Hypertension Brother    Scoliosis Brother    Skin cancer Maternal Grandmother     Past Surgical History:  Procedure Laterality Date   CARDIAC CATHETERIZATION     05/17/12 / waiting on results   CARTILAGE SURGERY     RT wrist   [place] SECTION  1988 & 2008   x 2   CHOLECYSTECTOMY     DILITATION & CURRETTAGE/HYSTROSCOPY WITH HYDROTHERMAL ABLATION N/A 01/24/2015   Procedure: DILATATION & CURETTAGE/HYSTEROSCOPY WITH HYDROTHERMAL ABLATION;  Surgeon: Brock Bad, MD;  Location: [facility];  Service: Gynecology;  Laterality: N/A;  Ethelene Browns will be here   WISDOM TOOTH EXTRACTION     Social History   Occupational History   Occupation: Disabled  Tobacco Use   Smoking status: Never   Smokeless tobacco: Never  Substance and Sexual Activity   Alcohol use: Not Currently    Comment: seldom   Drug use: No   Sexual activity: Not Currently    Birth control/protection: Surgical    Comment: Tubal Ligation

## 2020-11-21 ENCOUNTER — Telehealth: Payer: Self-pay | Admitting: Family Medicine

## 2020-11-21 NOTE — Telephone Encounter (Signed)
Patient called. She would like Terri to call her. [phone]

## 2020-11-21 NOTE — Telephone Encounter (Signed)
I called. The patient forgot to ask about the weakness in her legs. This comes and goes. Sometimes she can walk without issues and other times she feels like her legs will give way. She denies any lower back pain. Please advise.

## 2020-11-21 NOTE — Telephone Encounter (Signed)
I called and advised the patient. She voiced understanding. 

## 2020-11-24 ENCOUNTER — Telehealth: Payer: Medicare Other

## 2020-11-24 ENCOUNTER — Telehealth: Payer: Self-pay

## 2020-11-24 NOTE — Telephone Encounter (Cosign Needed)
  Care Management   Follow Up Note   11/24/2020 Name: Katherine Gregory MRN: [id number] DOB: [birth year]   Referred by: Irena Reichmann, DO Reason for referral : Chronic Care Management (RN CM Follow up call - 2nd attempt )   A second unsuccessful telephone outreach was attempted today. The patient was referred to the case management team for assistance with care management and care coordination.   Follow Up Plan: A HIPPA compliant phone message was left for the patient providing contact information and requesting a return call.   Delsa Sale, RN, BSN, CCM Care Management Coordinator [facility] Care Management/[facility]  Direct Phone: [phone]

## 2020-11-27 ENCOUNTER — Telehealth: Payer: Self-pay | Admitting: Family Medicine

## 2020-11-27 NOTE — Telephone Encounter (Signed)
Pt calling to ask if she can get a prescription to receive a walker to assist her in day to day movement. The best call back number [phone].

## 2020-12-01 ENCOUNTER — Other Ambulatory Visit: Payer: Self-pay | Admitting: Nurse Practitioner

## 2020-12-01 DIAGNOSIS — J3489 Other specified disorders of nose and nasal sinuses: Secondary | ICD-10-CM

## 2020-12-01 NOTE — Telephone Encounter (Signed)
I called patient and left a message for her to call me back to let me know if she would like the Rx sent to her and she takes it to a medical supply store or would she like me to order it through [employer]? Also, is she wanting a standard walker with wheels or some other type?

## 2020-12-01 NOTE — Telephone Encounter (Signed)
The patient is in [place] -- possibly will be moving there. I have placed a script for a rollator walker (she requested rollator) in the mail to her at her current address there: [address] [place], [place] [address]. She will then be able to take it to a local medical supply store there.

## 2020-12-04 ENCOUNTER — Telehealth: Payer: Self-pay | Admitting: *Deleted

## 2020-12-04 NOTE — Chronic Care Management (AMB) (Signed)
  Care Management   Note  12/04/2020 Name: Katherine Gregory MRN: [id number] DOB: 03-Mar-1966  Katherine Gregory is a 55 y.o. year old female who is a primary care patient of Irena Reichmann, DO and is actively engaged with the care management team. I reached out to [employer] by phone today to assist with re-scheduling a follow up visit with the RN Case Manager  Follow up plan: Unsuccessful telephone outreach attempt made. A HIPAA compliant phone message was left for the patient providing contact information and requesting a return call.  The care management team will reach out to the patient again over the next 7 days.  If patient returns call to provider office, please advise to call Embedded Care Management Care Guide Misty Stanley  at [phone].  Gwenevere Ghazi  Care Guide, Embedded Care Coordination [facility] Management  Direct Dial: [phone]

## 2020-12-11 NOTE — Chronic Care Management (AMB) (Signed)
  Care Management   Note  12/11/2020 Name: Katherine Gregory MRN: [id number] DOB: [birth year]  Katherine Gregory is a 55 y.o. year old female who is a primary care patient of Irena Reichmann, DO and is actively engaged with the care management team. I reached out to [employer] by phone today to assist with re-scheduling a follow up visit with the RN Case Manager  Follow up plan: Unsuccessful telephone outreach attempt made. A HIPAA compliant phone message was left for the patient providing contact information and requesting a return call.  The care management team will reach out to the patient again over the next 7-14 days.  If patient returns call to provider office, please advise to call Embedded Care Management Care Guide Misty Stanley at [phone].  Gwenevere Ghazi  Care Guide, Embedded Care Coordination [facility] Management  Direct Dial: [phone]

## 2020-12-24 ENCOUNTER — Other Ambulatory Visit: Payer: Self-pay | Admitting: Nurse Practitioner

## 2020-12-24 DIAGNOSIS — E782 Mixed hyperlipidemia: Secondary | ICD-10-CM

## 2020-12-24 NOTE — Chronic Care Management (AMB) (Signed)
  Care Management   Note  12/24/2020 Name: Katherine Gregory MRN: [id number] DOB: [birth year]  Katherine Gregory is a 55 y.o. year old female who is a primary care patient of Irena Reichmann, DO and is actively engaged with the care management team. I reached out to [employer] by phone today to assist with re-scheduling a follow up visit with the RN Case Manager  Follow up plan: Third unsuccessful telephone outreach attempt made. A HIPAA compliant phone message was left for the patient providing contact information and requesting a return call. Unable to make contact on outreach attempts x 3. PCP Irena Reichmann, DO notified via routed documentation in medical record. We have been unable to make contact with the patient for follow up. The care management team is available to follow up with the patient after provider conversation with the patient regarding recommendation for care management engagement and subsequent re-referral to the care management team.   [facility] Guide, Embedded Care Coordination [facility]  Care Management  Direct Dial: [phone]

## 2020-12-25 ENCOUNTER — Telehealth: Payer: Medicare Other

## 2020-12-28 ENCOUNTER — Other Ambulatory Visit: Payer: Self-pay | Admitting: Nurse Practitioner

## 2020-12-28 DIAGNOSIS — J3489 Other specified disorders of nose and nasal sinuses: Secondary | ICD-10-CM

## 2021-02-16 ENCOUNTER — Other Ambulatory Visit: Payer: Self-pay | Admitting: Nurse Practitioner

## 2021-02-16 DIAGNOSIS — F331 Major depressive disorder, recurrent, moderate: Secondary | ICD-10-CM

## 2021-02-19 ENCOUNTER — Telehealth: Payer: Self-pay | Admitting: Neurology

## 2021-02-19 NOTE — Telephone Encounter (Signed)
Pt LVM have moved to [place], cancelling my appt for 02/24/21 with Dr. [name]. Want to know if Dr. [name] can recommend a neurologist in [place]. Would like a call from the nurse

## 2021-02-19 NOTE — Telephone Encounter (Signed)
Attempted to call pt

## 2021-02-24 ENCOUNTER — Ambulatory Visit: Payer: Medicare Other | Admitting: Neurology

## 2021-04-11 ENCOUNTER — Other Ambulatory Visit: Payer: Self-pay | Admitting: Nurse Practitioner

## 2022-05-12 DIAGNOSIS — K5900 Constipation, unspecified: Secondary | ICD-10-CM | POA: Diagnosis not present

## 2022-05-12 DIAGNOSIS — R1012 Left upper quadrant pain: Secondary | ICD-10-CM | POA: Diagnosis not present

## 2022-05-12 DIAGNOSIS — K625 Hemorrhage of anus and rectum: Secondary | ICD-10-CM | POA: Diagnosis not present

## 2022-05-12 DIAGNOSIS — R1310 Dysphagia, unspecified: Secondary | ICD-10-CM | POA: Diagnosis not present

## 2022-05-12 DIAGNOSIS — R079 Chest pain, unspecified: Secondary | ICD-10-CM | POA: Diagnosis not present

## 2022-05-12 DIAGNOSIS — R1013 Epigastric pain: Secondary | ICD-10-CM | POA: Diagnosis not present

## 2022-05-14 DIAGNOSIS — H526 Other disorders of refraction: Secondary | ICD-10-CM | POA: Diagnosis not present

## 2022-05-14 DIAGNOSIS — H43813 Vitreous degeneration, bilateral: Secondary | ICD-10-CM | POA: Diagnosis not present

## 2022-05-14 DIAGNOSIS — H2513 Age-related nuclear cataract, bilateral: Secondary | ICD-10-CM | POA: Diagnosis not present

## 2022-05-14 DIAGNOSIS — G43011 Migraine without aura, intractable, with status migrainosus: Secondary | ICD-10-CM | POA: Diagnosis not present

## 2022-05-14 DIAGNOSIS — H524 Presbyopia: Secondary | ICD-10-CM | POA: Diagnosis not present

## 2022-05-19 DIAGNOSIS — M25551 Pain in right hip: Secondary | ICD-10-CM | POA: Diagnosis not present

## 2022-05-19 DIAGNOSIS — K047 Periapical abscess without sinus: Secondary | ICD-10-CM | POA: Diagnosis not present

## 2022-05-31 DIAGNOSIS — M25561 Pain in right knee: Secondary | ICD-10-CM | POA: Diagnosis not present

## 2022-05-31 DIAGNOSIS — M25551 Pain in right hip: Secondary | ICD-10-CM | POA: Diagnosis not present

## 2022-05-31 DIAGNOSIS — M25562 Pain in left knee: Secondary | ICD-10-CM | POA: Diagnosis not present

## 2022-06-15 DIAGNOSIS — M25551 Pain in right hip: Secondary | ICD-10-CM | POA: Diagnosis not present

## 2022-06-15 DIAGNOSIS — M25562 Pain in left knee: Secondary | ICD-10-CM | POA: Diagnosis not present

## 2022-06-15 DIAGNOSIS — G4700 Insomnia, unspecified: Secondary | ICD-10-CM | POA: Diagnosis not present

## 2022-06-15 DIAGNOSIS — M25561 Pain in right knee: Secondary | ICD-10-CM | POA: Diagnosis not present

## 2022-06-30 ENCOUNTER — Encounter: Payer: Self-pay | Admitting: Gastroenterology

## 2022-06-30 DIAGNOSIS — M7061 Trochanteric bursitis, right hip: Secondary | ICD-10-CM | POA: Diagnosis not present

## 2022-06-30 DIAGNOSIS — R52 Pain, unspecified: Secondary | ICD-10-CM | POA: Diagnosis not present

## 2022-06-30 DIAGNOSIS — M2240 Chondromalacia patellae, unspecified knee: Secondary | ICD-10-CM | POA: Diagnosis not present

## 2022-06-30 DIAGNOSIS — Z1330 Encounter for screening examination for mental health and behavioral disorders, unspecified: Secondary | ICD-10-CM | POA: Diagnosis not present

## 2022-07-13 DIAGNOSIS — K5900 Constipation, unspecified: Secondary | ICD-10-CM | POA: Diagnosis not present

## 2022-07-13 DIAGNOSIS — R110 Nausea: Secondary | ICD-10-CM | POA: Diagnosis not present

## 2022-07-13 DIAGNOSIS — R1012 Left upper quadrant pain: Secondary | ICD-10-CM | POA: Diagnosis not present

## 2022-07-13 DIAGNOSIS — R1013 Epigastric pain: Secondary | ICD-10-CM | POA: Diagnosis not present

## 2022-07-13 DIAGNOSIS — R634 Abnormal weight loss: Secondary | ICD-10-CM | POA: Diagnosis not present

## 2022-07-13 DIAGNOSIS — R1310 Dysphagia, unspecified: Secondary | ICD-10-CM | POA: Diagnosis not present

## 2022-07-14 DIAGNOSIS — R946 Abnormal results of thyroid function studies: Secondary | ICD-10-CM | POA: Diagnosis not present

## 2022-07-14 DIAGNOSIS — R1013 Epigastric pain: Secondary | ICD-10-CM | POA: Diagnosis not present

## 2022-07-14 DIAGNOSIS — R634 Abnormal weight loss: Secondary | ICD-10-CM | POA: Diagnosis not present

## 2022-07-14 DIAGNOSIS — G10 Huntington's disease: Secondary | ICD-10-CM | POA: Diagnosis not present

## 2022-07-14 DIAGNOSIS — N200 Calculus of kidney: Secondary | ICD-10-CM | POA: Diagnosis not present

## 2022-07-14 DIAGNOSIS — R1012 Left upper quadrant pain: Secondary | ICD-10-CM | POA: Diagnosis not present

## 2022-07-14 DIAGNOSIS — R319 Hematuria, unspecified: Secondary | ICD-10-CM | POA: Diagnosis not present

## 2022-07-14 DIAGNOSIS — N3289 Other specified disorders of bladder: Secondary | ICD-10-CM | POA: Diagnosis not present

## 2022-07-14 DIAGNOSIS — R110 Nausea: Secondary | ICD-10-CM | POA: Diagnosis not present

## 2022-07-14 DIAGNOSIS — K5900 Constipation, unspecified: Secondary | ICD-10-CM | POA: Diagnosis not present

## 2022-07-19 DIAGNOSIS — K219 Gastro-esophageal reflux disease without esophagitis: Secondary | ICD-10-CM | POA: Diagnosis not present

## 2022-07-19 DIAGNOSIS — R1013 Epigastric pain: Secondary | ICD-10-CM | POA: Diagnosis not present

## 2022-07-19 DIAGNOSIS — Z9049 Acquired absence of other specified parts of digestive tract: Secondary | ICD-10-CM | POA: Diagnosis not present

## 2022-07-19 DIAGNOSIS — Z885 Allergy status to narcotic agent status: Secondary | ICD-10-CM | POA: Diagnosis not present

## 2022-07-19 DIAGNOSIS — Z880 Allergy status to penicillin: Secondary | ICD-10-CM | POA: Diagnosis not present

## 2022-07-19 DIAGNOSIS — R0789 Other chest pain: Secondary | ICD-10-CM | POA: Diagnosis not present

## 2022-07-19 DIAGNOSIS — N200 Calculus of kidney: Secondary | ICD-10-CM | POA: Diagnosis not present

## 2022-07-19 DIAGNOSIS — E785 Hyperlipidemia, unspecified: Secondary | ICD-10-CM | POA: Diagnosis not present

## 2022-07-19 DIAGNOSIS — Z886 Allergy status to analgesic agent status: Secondary | ICD-10-CM | POA: Diagnosis not present

## 2022-07-19 DIAGNOSIS — E079 Disorder of thyroid, unspecified: Secondary | ICD-10-CM | POA: Diagnosis not present

## 2022-07-19 DIAGNOSIS — R072 Precordial pain: Secondary | ICD-10-CM | POA: Diagnosis not present

## 2022-07-19 DIAGNOSIS — R92323 Mammographic fibroglandular density, bilateral breasts: Secondary | ICD-10-CM | POA: Diagnosis not present

## 2022-07-19 DIAGNOSIS — N3289 Other specified disorders of bladder: Secondary | ICD-10-CM | POA: Diagnosis not present

## 2022-07-19 DIAGNOSIS — Z1231 Encounter for screening mammogram for malignant neoplasm of breast: Secondary | ICD-10-CM | POA: Diagnosis not present

## 2022-07-19 DIAGNOSIS — I771 Stricture of artery: Secondary | ICD-10-CM | POA: Diagnosis not present

## 2022-07-19 DIAGNOSIS — R9431 Abnormal electrocardiogram [ECG] [EKG]: Secondary | ICD-10-CM | POA: Diagnosis not present

## 2022-07-19 DIAGNOSIS — I10 Essential (primary) hypertension: Secondary | ICD-10-CM | POA: Diagnosis not present

## 2022-07-19 DIAGNOSIS — Z79899 Other long term (current) drug therapy: Secondary | ICD-10-CM | POA: Diagnosis not present

## 2022-07-19 DIAGNOSIS — R079 Chest pain, unspecified: Secondary | ICD-10-CM | POA: Diagnosis not present

## 2022-07-19 DIAGNOSIS — K5900 Constipation, unspecified: Secondary | ICD-10-CM | POA: Diagnosis not present

## 2022-07-19 DIAGNOSIS — Z888 Allergy status to other drugs, medicaments and biological substances status: Secondary | ICD-10-CM | POA: Diagnosis not present

## 2022-07-19 DIAGNOSIS — K429 Umbilical hernia without obstruction or gangrene: Secondary | ICD-10-CM | POA: Diagnosis not present

## 2022-07-20 DIAGNOSIS — R1013 Epigastric pain: Secondary | ICD-10-CM | POA: Diagnosis not present

## 2022-07-20 DIAGNOSIS — R1012 Left upper quadrant pain: Secondary | ICD-10-CM | POA: Diagnosis not present

## 2022-07-20 DIAGNOSIS — R110 Nausea: Secondary | ICD-10-CM | POA: Diagnosis not present

## 2022-07-20 DIAGNOSIS — R634 Abnormal weight loss: Secondary | ICD-10-CM | POA: Diagnosis not present

## 2022-07-21 DIAGNOSIS — K219 Gastro-esophageal reflux disease without esophagitis: Secondary | ICD-10-CM | POA: Diagnosis not present

## 2022-07-21 DIAGNOSIS — K5900 Constipation, unspecified: Secondary | ICD-10-CM | POA: Diagnosis not present

## 2022-07-22 DIAGNOSIS — F32A Depression, unspecified: Secondary | ICD-10-CM | POA: Diagnosis not present

## 2022-07-22 DIAGNOSIS — R0789 Other chest pain: Secondary | ICD-10-CM | POA: Diagnosis not present

## 2022-07-22 DIAGNOSIS — Z9889 Other specified postprocedural states: Secondary | ICD-10-CM | POA: Diagnosis not present

## 2022-07-22 DIAGNOSIS — I200 Unstable angina: Secondary | ICD-10-CM | POA: Diagnosis not present

## 2022-07-22 DIAGNOSIS — I2510 Atherosclerotic heart disease of native coronary artery without angina pectoris: Secondary | ICD-10-CM | POA: Diagnosis not present

## 2022-07-22 DIAGNOSIS — G10 Huntington's disease: Secondary | ICD-10-CM | POA: Diagnosis not present

## 2022-07-22 DIAGNOSIS — Z79899 Other long term (current) drug therapy: Secondary | ICD-10-CM | POA: Diagnosis not present

## 2022-07-22 DIAGNOSIS — K219 Gastro-esophageal reflux disease without esophagitis: Secondary | ICD-10-CM | POA: Diagnosis not present

## 2022-07-22 DIAGNOSIS — Z8249 Family history of ischemic heart disease and other diseases of the circulatory system: Secondary | ICD-10-CM | POA: Diagnosis not present

## 2022-07-22 DIAGNOSIS — Z79890 Hormone replacement therapy (postmenopausal): Secondary | ICD-10-CM | POA: Diagnosis not present

## 2022-07-22 DIAGNOSIS — I10 Essential (primary) hypertension: Secondary | ICD-10-CM | POA: Diagnosis not present

## 2022-07-22 DIAGNOSIS — D649 Anemia, unspecified: Secondary | ICD-10-CM | POA: Diagnosis not present

## 2022-07-22 DIAGNOSIS — Z880 Allergy status to penicillin: Secondary | ICD-10-CM | POA: Diagnosis not present

## 2022-07-22 DIAGNOSIS — E7849 Other hyperlipidemia: Secondary | ICD-10-CM | POA: Diagnosis not present

## 2022-07-22 DIAGNOSIS — E876 Hypokalemia: Secondary | ICD-10-CM | POA: Diagnosis not present

## 2022-07-22 DIAGNOSIS — Z8659 Personal history of other mental and behavioral disorders: Secondary | ICD-10-CM | POA: Diagnosis not present

## 2022-07-22 DIAGNOSIS — Z886 Allergy status to analgesic agent status: Secondary | ICD-10-CM | POA: Diagnosis not present

## 2022-07-22 DIAGNOSIS — K449 Diaphragmatic hernia without obstruction or gangrene: Secondary | ICD-10-CM | POA: Diagnosis not present

## 2022-07-22 DIAGNOSIS — R079 Chest pain, unspecified: Secondary | ICD-10-CM | POA: Diagnosis not present

## 2022-07-22 DIAGNOSIS — R7303 Prediabetes: Secondary | ICD-10-CM | POA: Diagnosis not present

## 2022-07-22 DIAGNOSIS — E079 Disorder of thyroid, unspecified: Secondary | ICD-10-CM | POA: Diagnosis not present

## 2022-07-22 DIAGNOSIS — R9431 Abnormal electrocardiogram [ECG] [EKG]: Secondary | ICD-10-CM | POA: Diagnosis not present

## 2022-07-22 DIAGNOSIS — I25110 Atherosclerotic heart disease of native coronary artery with unstable angina pectoris: Secondary | ICD-10-CM | POA: Diagnosis not present

## 2022-07-27 DIAGNOSIS — R9431 Abnormal electrocardiogram [ECG] [EKG]: Secondary | ICD-10-CM | POA: Diagnosis not present

## 2022-07-27 DIAGNOSIS — Z885 Allergy status to narcotic agent status: Secondary | ICD-10-CM | POA: Diagnosis not present

## 2022-07-27 DIAGNOSIS — Z888 Allergy status to other drugs, medicaments and biological substances status: Secondary | ICD-10-CM | POA: Diagnosis not present

## 2022-07-27 DIAGNOSIS — R1013 Epigastric pain: Secondary | ICD-10-CM | POA: Diagnosis not present

## 2022-07-27 DIAGNOSIS — Z886 Allergy status to analgesic agent status: Secondary | ICD-10-CM | POA: Diagnosis not present

## 2022-07-27 DIAGNOSIS — E079 Disorder of thyroid, unspecified: Secondary | ICD-10-CM | POA: Diagnosis not present

## 2022-07-27 DIAGNOSIS — R0789 Other chest pain: Secondary | ICD-10-CM | POA: Diagnosis not present

## 2022-07-27 DIAGNOSIS — K219 Gastro-esophageal reflux disease without esophagitis: Secondary | ICD-10-CM | POA: Diagnosis not present

## 2022-07-27 DIAGNOSIS — R079 Chest pain, unspecified: Secondary | ICD-10-CM | POA: Diagnosis not present

## 2022-07-27 DIAGNOSIS — Z79899 Other long term (current) drug therapy: Secondary | ICD-10-CM | POA: Diagnosis not present

## 2022-07-27 DIAGNOSIS — G10 Huntington's disease: Secondary | ICD-10-CM | POA: Diagnosis not present

## 2022-07-27 DIAGNOSIS — Z880 Allergy status to penicillin: Secondary | ICD-10-CM | POA: Diagnosis not present

## 2022-07-27 DIAGNOSIS — I10 Essential (primary) hypertension: Secondary | ICD-10-CM | POA: Diagnosis not present

## 2022-07-29 DIAGNOSIS — G10 Huntington's disease: Secondary | ICD-10-CM | POA: Diagnosis not present

## 2022-07-29 DIAGNOSIS — R946 Abnormal results of thyroid function studies: Secondary | ICD-10-CM | POA: Diagnosis not present

## 2022-07-29 DIAGNOSIS — M7061 Trochanteric bursitis, right hip: Secondary | ICD-10-CM | POA: Diagnosis not present

## 2022-07-29 DIAGNOSIS — R079 Chest pain, unspecified: Secondary | ICD-10-CM | POA: Diagnosis not present

## 2022-07-29 DIAGNOSIS — M2240 Chondromalacia patellae, unspecified knee: Secondary | ICD-10-CM | POA: Diagnosis not present

## 2022-07-29 DIAGNOSIS — R52 Pain, unspecified: Secondary | ICD-10-CM | POA: Diagnosis not present

## 2022-08-02 DIAGNOSIS — E161 Other hypoglycemia: Secondary | ICD-10-CM | POA: Diagnosis not present

## 2022-08-02 DIAGNOSIS — Z886 Allergy status to analgesic agent status: Secondary | ICD-10-CM | POA: Diagnosis not present

## 2022-08-02 DIAGNOSIS — R0602 Shortness of breath: Secondary | ICD-10-CM | POA: Diagnosis not present

## 2022-08-02 DIAGNOSIS — G10 Huntington's disease: Secondary | ICD-10-CM | POA: Diagnosis not present

## 2022-08-02 DIAGNOSIS — Z885 Allergy status to narcotic agent status: Secondary | ICD-10-CM | POA: Diagnosis not present

## 2022-08-02 DIAGNOSIS — R1013 Epigastric pain: Secondary | ICD-10-CM | POA: Diagnosis not present

## 2022-08-02 DIAGNOSIS — R109 Unspecified abdominal pain: Secondary | ICD-10-CM | POA: Diagnosis not present

## 2022-08-02 DIAGNOSIS — R0789 Other chest pain: Secondary | ICD-10-CM | POA: Diagnosis not present

## 2022-08-02 DIAGNOSIS — D649 Anemia, unspecified: Secondary | ICD-10-CM | POA: Diagnosis not present

## 2022-08-02 DIAGNOSIS — F32A Depression, unspecified: Secondary | ICD-10-CM | POA: Diagnosis not present

## 2022-08-02 DIAGNOSIS — Z79899 Other long term (current) drug therapy: Secondary | ICD-10-CM | POA: Diagnosis not present

## 2022-08-02 DIAGNOSIS — I10 Essential (primary) hypertension: Secondary | ICD-10-CM | POA: Diagnosis not present

## 2022-08-02 DIAGNOSIS — Z888 Allergy status to other drugs, medicaments and biological substances status: Secondary | ICD-10-CM | POA: Diagnosis not present

## 2022-08-02 DIAGNOSIS — R42 Dizziness and giddiness: Secondary | ICD-10-CM | POA: Diagnosis not present

## 2022-08-02 DIAGNOSIS — R079 Chest pain, unspecified: Secondary | ICD-10-CM | POA: Diagnosis not present

## 2022-08-02 DIAGNOSIS — R1010 Upper abdominal pain, unspecified: Secondary | ICD-10-CM | POA: Diagnosis not present

## 2022-08-02 DIAGNOSIS — E162 Hypoglycemia, unspecified: Secondary | ICD-10-CM | POA: Diagnosis not present

## 2022-08-02 DIAGNOSIS — E079 Disorder of thyroid, unspecified: Secondary | ICD-10-CM | POA: Diagnosis not present

## 2022-08-02 DIAGNOSIS — R1084 Generalized abdominal pain: Secondary | ICD-10-CM | POA: Diagnosis not present

## 2022-08-04 DIAGNOSIS — M7061 Trochanteric bursitis, right hip: Secondary | ICD-10-CM | POA: Diagnosis not present

## 2022-08-04 DIAGNOSIS — M2240 Chondromalacia patellae, unspecified knee: Secondary | ICD-10-CM | POA: Diagnosis not present

## 2022-08-04 DIAGNOSIS — R52 Pain, unspecified: Secondary | ICD-10-CM | POA: Diagnosis not present

## 2022-11-23 ENCOUNTER — Encounter: Payer: Self-pay | Admitting: Neurology

## 2022-12-03 ENCOUNTER — Emergency Department (HOSPITAL_COMMUNITY)
Admission: EM | Admit: 2022-12-03 | Discharge: 2022-12-03 | Disposition: A | Discharge status: Home / Self Care | Payer: Medicare Other | Attending: Emergency Medicine | Admitting: Emergency Medicine

## 2022-12-03 ENCOUNTER — Other Ambulatory Visit: Payer: Self-pay

## 2022-12-03 ENCOUNTER — Encounter (HOSPITAL_COMMUNITY): Payer: Self-pay

## 2022-12-03 ENCOUNTER — Emergency Department (HOSPITAL_COMMUNITY): Payer: Medicare Other

## 2022-12-03 DIAGNOSIS — K219 Gastro-esophageal reflux disease without esophagitis: Secondary | ICD-10-CM | POA: Diagnosis not present

## 2022-12-03 DIAGNOSIS — R197 Diarrhea, unspecified: Secondary | ICD-10-CM | POA: Diagnosis not present

## 2022-12-03 DIAGNOSIS — R079 Chest pain, unspecified: Secondary | ICD-10-CM | POA: Diagnosis present

## 2022-12-03 DIAGNOSIS — Z91040 Latex allergy status: Secondary | ICD-10-CM | POA: Diagnosis not present

## 2022-12-03 LAB — COMPREHENSIVE METABOLIC PANEL
ALT: 25 U/L (ref 0–44)
AST: 20 U/L (ref 15–41)
Albumin: 3.6 g/dL (ref 3.5–5.0)
Alkaline Phosphatase: 121 U/L (ref 38–126)
Anion gap: 13 (ref 5–15)
BUN: 5 mg/dL — ABNORMAL LOW (ref 6–20)
CO2: 24 mmol/L (ref 22–32)
Calcium: 9.8 mg/dL (ref 8.9–10.3)
Chloride: 105 mmol/L (ref 98–111)
Creatinine, Ser: 0.88 mg/dL (ref 0.44–1.00)
GFR, Estimated: >60 mL/min (ref >60)
Glucose, Bld: 94 mg/dL (ref 70–99)
Potassium: 5 mmol/L (ref 3.5–5.1)
Sodium: 142 mmol/L (ref 135–145)
Total Bilirubin: 0.7 mg/dL (ref 0.3–1.2)
Total Protein: 7.4 g/dL (ref 6.5–8.1)

## 2022-12-03 LAB — CBC
HCT: 44.3 % (ref 36.0–46.0)
Hemoglobin: 14.3 g/dL (ref 12.0–15.0)
MCH: 28.6 pg (ref 26.0–34.0)
MCHC: 32.3 g/dL (ref 30.0–36.0)
MCV: 88.6 fL (ref 80.0–100.0)
Platelets: 381 10*3/uL (ref 150–400)
RBC: 5 MIL/uL (ref 3.87–5.11)
RDW: 13.2 % (ref 11.5–15.5)
WBC: 9.5 10*3/uL (ref 4.0–10.5)
nRBC: 0 % (ref 0.0–0.2)

## 2022-12-03 LAB — TROPONIN I (HIGH SENSITIVITY)
Troponin I (High Sensitivity): 3 ng/L (ref <18)
Troponin I (High Sensitivity): 4 ng/L (ref <18)

## 2022-12-03 LAB — LIPASE, BLOOD: Lipase: 32 U/L (ref 11–51)

## 2022-12-03 LAB — POC OCCULT BLOOD, ED: Fecal Occult Bld: NEGATIVE

## 2022-12-03 MED ORDER — MAALOX MAX 400-400-40 MG/5ML PO SUSP: 10.0000 mL | Freq: Four times a day (QID) | ORAL | 0 refills | Status: AC | PRN

## 2022-12-03 MED ORDER — HYDROCODONE-ACETAMINOPHEN 5-325 MG PO TABS
1.0000 | ORAL_TABLET | Freq: Once | ORAL | Status: AC
  Administered 2022-12-03: 1 via ORAL
  Filled 2022-12-03: qty 1

## 2022-12-03 MED ORDER — LIDOCAINE VISCOUS HCL 2 % MT SOLN
15.0000 mL | Freq: Once | OROMUCOSAL | Status: AC
  Administered 2022-12-03: 15 mL via ORAL
  Filled 2022-12-03: qty 15

## 2022-12-03 MED ORDER — SUCRALFATE 1 G PO TABS
1.0000 g | ORAL_TABLET | Freq: Once | ORAL | Status: AC
  Administered 2022-12-03: 1 g via ORAL
  Filled 2022-12-03: qty 1

## 2022-12-03 MED ORDER — PANTOPRAZOLE SODIUM 20 MG PO TBEC: 20.0000 mg | DELAYED_RELEASE_TABLET | Freq: Two times a day (BID) | ORAL | 0 refills | Status: AC

## 2022-12-03 MED ORDER — FAMOTIDINE 20 MG PO TABS: 20.0000 mg | ORAL_TABLET | Freq: Two times a day (BID) | ORAL | 0 refills | Status: AC

## 2022-12-03 MED ORDER — ALUM & MAG HYDROXIDE-SIMETH 200-200-20 MG/5ML PO SUSP
30.0000 mL | Freq: Once | ORAL | Status: AC
  Administered 2022-12-03: 30 mL via ORAL
  Filled 2022-12-03: qty 30

## 2022-12-03 MED ORDER — FAMOTIDINE 20 MG PO TABS
20.0000 mg | ORAL_TABLET | Freq: Once | ORAL | Status: AC
  Administered 2022-12-03: 20 mg via ORAL
  Filled 2022-12-03: qty 1

## 2022-12-03 NOTE — Discharge Instructions (Addendum)
Take Proxonix twice daily. Take Maalox solution and Pepcid tablets as needed for acid reflux symptoms. Please follow up with Gastroenterology clinic.

## 2022-12-03 NOTE — ED Notes (Signed)
Discharge instructions reviewed. Pharmacy verified.   Prescriptions discussed.   Opportunity for questions and concerns provided.   As requested. Pt and sister placed in waiting area until ride arrives.

## 2022-12-03 NOTE — ED Provider Notes (Signed)
[place] EMERGENCY DEPARTMENT AT [facility] Provider Note   CSN: [id number] Arrival date & time: 12/03/22  1707     History  Chief Complaint  Patient presents with   Abdominal Pain   Chest Pain    Katherine Gregory is a 57 y.o. female.  Patient is a 57 year old woman presenting with chest pain. Patient reports having intermittent substernal chest pain and epigastric pain for months. Pain has been more frequent in the past few days. States the pain occurs randomly throughout the day, sometimes can be relieved by changing position. Has had extensive workup with cardiology including echo, catheterization, with no cardiac pathology discovered. Also reports some episodes of diarrhea lately with some coffee ground specks in her stool. She takes Prilosec for acid reflux but has been taking it less frequently lately. Reports nausea without vomiting. No fever, vomiting, hematemesis, BRBPR.    Abdominal Pain Associated symptoms: chest pain, diarrhea and nausea   Associated symptoms: no fever, no shortness of breath and no vomiting   Chest Pain Associated symptoms: abdominal pain and nausea   Associated symptoms: no fever, no shortness of breath and no vomiting        Home Medications Prior to Admission medications   Medication Sig Start Date End Date Taking? Authorizing Provider  albuterol (VENTOLIN HFA) 108 (90 Base) MCG/ACT inhaler Inhale 2 puffs into the lungs every 6 (six) hours as needed for wheezing or shortness of breath. 09/28/22  Yes [provider]  amitriptyline (ELAVIL) 25 MG tablet Take 25 mg by mouth at bedtime as needed for sleep. 11/22/22  Yes [provider]  azelastine (ASTELIN) 0.1 % nasal spray PLACE 2 SPRAYS INTO BOTH NOSTRILS 2 (TWO) TIMES DAILY. USE IN EACH NOSTRIL AS DIRECTED Patient taking differently: Place 2 sprays into both nostrils 2 (two) times daily as needed for rhinitis or allergies. 12/01/20  Yes Arnette Felts, FNP  B  Complex Vitamins (VITAMIN B COMPLEX PO) Take 1 tablet by mouth daily.    Yes [provider]  Calcium Carb-Cholecalciferol (CALCIUM 1000 + D) 1000-800 MG-UNIT TABS Take 1 tablet by mouth daily.    Yes [provider]  Cholecalciferol (VITAMIN D3) 50 MCG (2000 UT) TABS Take 2,000 Units by mouth daily.    Yes [provider]  DULoxetine (CYMBALTA) 30 MG capsule TAKE 1 CAPSULE BY MOUTH TWICE DAILY Patient taking differently: Take 30 mg by mouth 2 (two) times daily. 07/15/20  Yes Arnette Felts, FNP  isosorbide mononitrate (IMDUR) 30 MG 24 hr tablet Take 30 mg by mouth daily. 11/02/22  Yes [provider]  meloxicam (MOBIC) 7.5 MG tablet Take 7.5 mg by mouth daily as needed for pain.   Yes [provider]  metoprolol succinate (TOPROL-XL) 50 MG 24 hr tablet Take 50 mg by mouth daily. 11/02/22  Yes [provider]  Multiple Vitamin (MULTIVITAMIN ADULT PO) Take 1 tablet by mouth daily.    Yes [provider]  Multiple Vitamins-Minerals (ZINC PO) Take 50 mg by mouth daily.    Yes [provider]  omeprazole (PRILOSEC) 40 MG capsule TAKE 1 CAPSULE BY MOUTH EVERY DAY Patient taking differently: Take 40 mg by mouth 2 (two) times daily. 10/07/20  Yes Arnette Felts, FNP  sucralfate (CARAFATE) 1 g tablet Take 1 g by mouth 2 (two) times daily.   Yes [provider]  Wheat Dextrin (BENEFIBER PO) Take 1 tablet by mouth daily as needed (constipation).   Yes [provider]  zolpidem (AMBIEN) 10 MG tablet Take 10 mg by mouth at bedtime. 11/18/22  Yes [provider]  alum & mag hydroxide-simeth (MAALOX MAX) 400-400-40 MG/5ML suspension Take 10 mLs by mouth every 6 (six) hours as needed for up to 36 doses for indigestion. 12/03/22  Yes Monna Fam, MD  atorvastatin (LIPITOR) 20 MG tablet TAKE 1 TABLET BY MOUTH EVERY MONDAY, WEDNESDAY, AND FRIDAY Patient not taking: Reported on 12/03/2022 12/24/20   Arnette Felts, FNP  celecoxib  (CELEBREX) 100 MG capsule Take 1 capsule (100 mg total) by mouth 2 (two) times daily as needed. Patient not taking: Reported on 12/03/2022 11/19/20   Hilts, Casimiro Needle, MD  famotidine (PEPCID) 20 MG tablet Take 1 tablet (20 mg total) by mouth 2 (two) times daily. 12/03/22  Yes Monna Fam, MD  fluticasone [facility]) 50 MCG/ACT nasal spray Place 2 sprays into both nostrils daily. Patient not taking: Reported on 12/03/2022 06/18/20 06/18/21  Arnette Felts, FNP  mometasone (NASONEX) 50 MCG/ACT nasal spray SHAKE LIQUID AND USE 2 SPRAYS IN [facility] NOSTRIL DAILY Patient not taking: Reported on 12/03/2022 10/03/20   Arnette Felts, FNP  pantoprazole (PROTONIX) 20 MG tablet Take 1 tablet (20 mg total) by mouth 2 (two) times daily for 14 days. 12/03/22 12/17/22 Yes Monna Fam, MD  tetrabenazine [facility]) 12.5 MG tablet Increase to 25 mg in the morning, take 12.5 mg in the evening Patient not taking: Reported on 12/03/2022 11/17/20   Glean Salvo, NP      Allergies    Ibuprofen, Latex, Morphine, Morphine and codeine, Penicillins, Risperdal [risperidone], Pseudoephedrine, Tape, and Vitamin [cyanocobalamin]    Review of Systems   Review of Systems  Constitutional:  Negative for fever.  Respiratory:  Negative for shortness of breath.   Cardiovascular:  Positive for chest pain.  Gastrointestinal:  Positive for abdominal pain, diarrhea and nausea. Negative for blood in stool and vomiting.    Physical Exam Updated Vital Signs BP (!) 147/78   Pulse (!) 58   Temp 98.2 F (36.8 C) (Oral)   Resp 17   SpO2 97%  Physical Exam Constitutional:      General: She is not in acute distress. Cardiovascular:     Rate and Rhythm: Normal rate and regular rhythm.  Pulmonary:     Effort: Pulmonary effort is normal. No respiratory distress.     Breath sounds: Normal breath sounds.  Abdominal:     General: There is no distension.     Palpations: Abdomen is soft.     Tenderness: There is no abdominal tenderness.  Neurological:      Mental Status: She is alert.     ED Results / Procedures / Treatments   Labs (all labs ordered are listed, but only abnormal results are displayed) Labs Reviewed  COMPREHENSIVE METABOLIC PANEL - Abnormal; Notable for the following components:      Result Value   BUN 5 (*)    All other components within normal limits  CBC  LIPASE, BLOOD  POC OCCULT BLOOD, ED  TROPONIN I (HIGH SENSITIVITY)  TROPONIN I (HIGH SENSITIVITY)    EKG EKG Interpretation Date/Time:  Friday December 03 2022 17:20:02 EDT Ventricular Rate:  86 PR Interval:  124 QRS Duration:  78 QT Interval:  354 QTC Calculation: 423 R Axis:   32  Text Interpretation: Normal sinus rhythm Possible Left atrial enlargement ST & T wave abnormality, consider inferior ischemia ST & T wave abnormality, consider anterolateral ischemia Abnormal ECG When compared with ECG of 30-Nov-2019 00:42,  PREVIOUS ECG IS PRESENT Confirmed by Glyn Ade [phone]) on 12/03/2022 9:22:07 PM  Radiology DG Chest 2 View  Result Date: 12/03/2022 CLINICAL DATA:  Chest pain EXAM: CHEST - 2 VIEW COMPARISON:  09/20/1999 FINDINGS: Transverse diameter of heart is increased. There are no signs of pulmonary edema or focal pulmonary consolidation. There is no pleural effusion or pneumothorax. IMPRESSION: Cardiomegaly. There are no signs of pulmonary edema or focal pulmonary consolidation. Electronically Signed   By: Ernie Avena M.D.   On: 12/03/2022 18:58    Procedures Procedures    Medications Ordered in ED Medications  HYDROcodone-acetaminophen (NORCO/VICODIN) 5-325 MG per tablet 1 tablet (1 tablet Oral Given 12/03/22 1832)  sucralfate (CARAFATE) tablet 1 g (1 g Oral Given 12/03/22 2026)  alum & mag hydroxide-simeth (MAALOX/MYLANTA) 200-200-20 MG/5ML suspension 30 mL (30 mLs Oral Given 12/03/22 2114)    And  lidocaine (XYLOCAINE) 2 % viscous mouth solution 15 mL (15 mLs Oral Given 12/03/22 2115)  famotidine (PEPCID) tablet 20 mg (20 mg Oral  Given 12/03/22 2115)    ED Course/ Medical Decision Making/ A&P Clinical Course as of 12/03/22 2128  Fri Dec 03, 2022  2016 Stable 11 YOF with a chief complaint of months of epigastric abdominal pain. Follows with Cards and nondiagonstic.  Comes in with epigastric abdominal pain and stool changes that are nonspecific.  Described coffee-ground material. Planning for fecal occult blood testing for risk of bleeding peptic ulcer disease given longstanding history of GERD noncompliance with PPIs. If fecal occult blood is positive we will consult GI if negative then she will follow with GI in outpatient setting with empiric PPI and Carafate treatment. [CC]  2121 Protonix 20mg  BID GI referal Maalox TID  Pepcid PRN [CC]    Clinical Course User Index [CC] Glyn Ade, MD                             Medical Decision Making Patient presented with substernal and epigastric pain. Patient was afebrile and hemodynamically stable throughout ED stay. Labs were reassuring and fecal occult blood test showed no blood. Imaging showed no acute pathology. Patient was given GI cocktail for acid reflux symptoms. On reassessment, patient was laying comfortably in bed. Patient agreed with plan to be discharged with Protonix, Pepcid, and Maalox for continued GERD symptoms. Patient will follow up with outpatient GI clinic.          Final Clinical Impression(s) / ED Diagnoses Final diagnoses:  Chest pain, unspecified type  Gastroesophageal reflux disease, unspecified whether esophagitis present    Rx / DC Orders ED Discharge Orders          Ordered    pantoprazole (PROTONIX) 20 MG tablet  2 times daily        12/03/22 2123    alum & mag hydroxide-simeth (MAALOX MAX) 400-400-40 MG/5ML suspension  Every 6 hours PRN        12/03/22 2123    famotidine (PEPCID) 20 MG tablet  2 times daily        12/03/22 2123              Monna Fam, MD 12/03/22 2130    Glyn Ade, MD 12/03/22  2333

## 2022-12-03 NOTE — ED Triage Notes (Signed)
Pt c/o non radiating midsternal chest pain, mid upper abdominal pain, SOB and black coffee ground stool. Pt states she has had diarrheax2wks. Pt states saw the mucous and coffee ground in stool today and the other day. Pt is able to speak in complete sentences. Pt states the chest pain and abdominal pain have been 3 days

## 2022-12-03 NOTE — ED Provider Triage Note (Signed)
Emergency Medicine Provider Triage Evaluation Note  Katherine Gregory , a 57 y.o. female  was evaluated in triage.  Pt complains of months of abdominal pain and chest pain, worse today.  Review of Systems  Positive: Abdominal pain Negative:   Physical Exam  BP (!) 139/101 (BP Location: Right Arm)   Pulse 97   Temp 98.1 F (36.7 C) (Oral)   Resp 16   SpO2 100%  Gen:   Awake, no distress   Resp:  Normal effort  MSK:   Moves extremities without difficulty  Other:    Medical Decision Making  Medically screening exam initiated at 6:29 PM.  Appropriate orders placed.  Katherine Gregory was informed that the remainder of the evaluation will be completed by another provider, this initial triage assessment does not replace that evaluation, and the importance of remaining in the ED until their evaluation is complete.     Arthor Captain, PA-C 12/03/22 1830

## 2023-01-03 NOTE — Progress Notes (Deleted)
Assessment/Plan:   *** Subjective:   Katherine Gregory was seen today in neurologic consultation at the request of [name], MD.  The consultation is for the evaluation of Huntington's disease.  The patient has seen a lot of neurologists throughout multiple health systems and notes made available to me are reviewed.  She has seen Dr. [name], Dr. [name], Dr. [name] and Dr. [name].  Patient was diagnosed with Huntington's disease in 2015 with positive genetic testing with 42 CAG repeats, per prior neurology records.  Patient has strong family history of Parkinsons disease in her mother (diagnosed in her 95s) and her sister (diagnosed 20 years old).  When she saw Dr. [name] in 2015, she had positive genetic testing, but very little in the way of positive physical exam signs.  She had no chorea.  She subsequently followed back up with Dr. [name].  She saw her for several years and then the patient moved to [place].  When she moved back, the patient came back to [facility] neurology.  Prior medications: Cymbalta Amitriptyline Gabapentin Lyrica Sertraline  The patient is a 57 y.o. year old female with a history of ***.  The first symptom began *** years ago.   Neuroimaging has *** previously been performed.  It *** available for my review today.  PREVIOUS MEDICATIONS: ***  ALLERGIES:   Allergies  Allergen Reactions   Ibuprofen Other (See Comments)    "It intensifies my pain." Made pain and symptoms worse. "It intensifies my pain."   Latex     Other Reaction(s): Unknown   Morphine Itching    Red blotches on skin and burning.   Morphine And Codeine Itching and Other (See Comments)    Reaction burns   Penicillins Hives and Itching    Has patient had a PCN reaction causing immediate rash, facial/tongue/throat swelling, SOB or lightheadedness with hypotension: Yes  Has patient had a PCN reaction causing severe rash involving mucus membranes or skin necrosis: No  Has patient  had a PCN reaction that required hospitalization No  Has patient had a PCN reaction occurring within the last 10 years: No  If all of the above answers are "NO", then may proceed with Cephalosporin use.  Other Reaction(s): Unknown   Risperdal [Risperidone] Other (See Comments)    Confusion, oversedation    Pseudoephedrine Other (See Comments) and Palpitations    Speed up heart rate Speed up heart rate   Tape Itching and Rash    Itching and burning at site   Vitamin [Cyanocobalamin] Hives and Rash    CURRENT MEDICATIONS:  Outpatient Encounter Medications as of 01/05/2023  Medication Sig   albuterol (VENTOLIN HFA) 108 (90 Base) MCG/ACT inhaler Inhale 2 puffs into the lungs every 6 (six) hours as needed for wheezing or shortness of breath.   alum & mag hydroxide-simeth (MAALOX MAX) 400-400-40 MG/5ML suspension Take 10 mLs by mouth every 6 (six) hours as needed for up to 36 doses for indigestion.   amitriptyline (ELAVIL) 25 MG tablet Take 25 mg by mouth at bedtime as needed for sleep.   atorvastatin (LIPITOR) 20 MG tablet TAKE 1 TABLET BY MOUTH EVERY MONDAY, WEDNESDAY, AND FRIDAY (Patient not taking: Reported on 12/03/2022)   azelastine (ASTELIN) 0.1 % nasal spray PLACE 2 SPRAYS INTO BOTH NOSTRILS 2 (TWO) TIMES DAILY. USE IN EACH NOSTRIL AS DIRECTED (Patient taking differently: Place 2 sprays into both nostrils 2 (two) times daily as needed for rhinitis or allergies.)   B Complex Vitamins (VITAMIN B COMPLEX  PO) Take 1 tablet by mouth daily.    Calcium Carb-Cholecalciferol (CALCIUM 1000 + D) 1000-800 MG-UNIT TABS Take 1 tablet by mouth daily.    celecoxib (CELEBREX) 100 MG capsule Take 1 capsule (100 mg total) by mouth 2 (two) times daily as needed. (Patient not taking: Reported on 12/03/2022)   Cholecalciferol (VITAMIN D3) 50 MCG (2000 UT) TABS Take 2,000 Units by mouth daily.    DULoxetine (CYMBALTA) 30 MG capsule TAKE 1 CAPSULE BY MOUTH TWICE DAILY (Patient taking differently: Take 30 mg by  mouth 2 (two) times daily.)   famotidine (PEPCID) 20 MG tablet Take 1 tablet (20 mg total) by mouth 2 (two) times daily.   fluticasone (FLONASE) 50 MCG/ACT nasal spray Place 2 sprays into both nostrils daily. (Patient not taking: Reported on 12/03/2022)   isosorbide mononitrate (IMDUR) 30 MG 24 hr tablet Take 30 mg by mouth daily.   meloxicam (MOBIC) 7.5 MG tablet Take 7.5 mg by mouth daily as needed for pain.   metoprolol succinate (TOPROL-XL) 50 MG 24 hr tablet Take 50 mg by mouth daily.   mometasone (NASONEX) 50 MCG/ACT nasal spray SHAKE LIQUID AND USE 2 SPRAYS IN EACH NOSTRIL DAILY (Patient not taking: Reported on 12/03/2022)   Multiple Vitamin (MULTIVITAMIN ADULT PO) Take 1 tablet by mouth daily.    Multiple Vitamins-Minerals (ZINC PO) Take 50 mg by mouth daily.    omeprazole (PRILOSEC) 40 MG capsule TAKE 1 CAPSULE BY MOUTH EVERY DAY (Patient taking differently: Take 40 mg by mouth 2 (two) times daily.)   pantoprazole (PROTONIX) 20 MG tablet Take 1 tablet (20 mg total) by mouth 2 (two) times daily for 14 days.   sucralfate (CARAFATE) 1 g tablet Take 1 g by mouth 2 (two) times daily.   tetrabenazine (XENAZINE) 12.5 MG tablet Increase to 25 mg in the morning, take 12.5 mg in the evening (Patient not taking: Reported on 12/03/2022)   Wheat Dextrin (BENEFIBER PO) Take 1 tablet by mouth daily as needed (constipation).   zolpidem (AMBIEN) 10 MG tablet Take 10 mg by mouth at bedtime.   No facility-administered encounter medications on file as of 01/05/2023.    Objective:   PHYSICAL EXAMINATION:    VITALS:  There were no vitals filed for this visit.  GEN:  Normal appears female in no acute distress.  Appears stated age. HEENT:  Normocephalic, atraumatic. The mucous membranes are moist. The superficial temporal arteries are without ropiness or tenderness. Cardiovascular: Regular rate and rhythm. Lungs: Clear to auscultation bilaterally. Neck/Heme: There are no carotid bruits noted  bilaterally.  NEUROLOGICAL: Orientation:  The patient is alert and oriented x 3.   Cranial nerves: There is good facial symmetry.  Extraocular muscles are intact and visual fields are full to confrontational testing. Speech is fluent and clear. Soft palate rises symmetrically and there is no tongue deviation. Hearing is intact to conversational tone. Tone: Tone is good throughout. Sensation: Sensation is intact to light touch and pinprick throughout (facial, trunk, extremities). Vibration is intact at the bilateral big toe. There is no extinction with double simultaneous stimulation. There is no sensory dermatomal level identified. Coordination:  The patient has no difficulty with RAM's or FNF bilaterally. Motor: Strength is 5/5 in the bilateral upper and lower extremities.  Shoulder shrug is equal and symmetric. There is no pronator drift.  There are no fasciculations noted. DTR's: Deep tendon reflexes are 2/4 at the bilateral biceps, triceps, brachioradialis, patella and achilles.  Plantar responses are downgoing bilaterally. Gait and Station:  The patient is able to ambulate without difficulty. The patient is able to heel toe walk without any difficulty. The patient is able to ambulate in a tandem fashion. The patient is able to stand in the Romberg position.    Total time spent on today's visit was ***greater than 60 minutes, including both face-to-face time and nonface-to-face time.  Time included that spent on review of records (prior notes available to me/labs/imaging if pertinent), discussing treatment and goals, answering patient's questions and coordinating care.   Cc:  Mihalovich, Julien Nordmann, PA-C

## 2023-01-05 ENCOUNTER — Ambulatory Visit: Payer: Medicare Other | Admitting: Neurology

## 2023-01-28 NOTE — Progress Notes (Deleted)
Assessment/Plan:   1.  Huntington's disease  -I do not have her genetic testing, but it was apparently positive with 42 CAG repeats per prior neurology records (diagnosed at [facility]). Subjective:   Katherine Gregory was seen today in neurologic consultation at the request of [name], MD.  The consultation is for the evaluation of Huntington's disease.  The patient has seen a lot of neurologists throughout multiple health systems and notes made available to me are reviewed.  She has seen Dr. [name], Dr. [name], Dr. [name] and Dr. [name].  She has seen neurologists out of state as well.  Patient was diagnosed with Huntington's disease in 2015 with positive genetic testing with 42 CAG repeats, per prior neurology records.  Patient has strong family history of Huntington's disease in her mother (diagnosed in her 83s) and her sister (diagnosed 71 years old).  When she saw Dr. [name] in 2015, she had positive genetic testing, but very little in the way of positive physical exam signs.  She had no chorea.  She subsequently followed back up with Dr. [name].  She saw her for several years and then the patient moved to [place].  When she moved back, the patient came back to [facility] neurology in 2021.  At that point in time, she was on Xenazine, 12.5 mg twice per day.  Patient then apparently moved to [place] in 2022 and when she came back she transferred care to [facility] neurology, Dr. [name].  She had a one-time visit with him in July, 2024.  When she saw him, she was no longer on the low-dose Xenazine or any medication for chorea.  He did start her on low-dose amitriptyline, 25 mg to 50 mg for headache.  Prior medications: Cymbalta Amitriptyline Gabapentin Lyrica Sertraline Xenazine, 12.5 mg twice per day  The patient is a 57 y.o. year old female with a history of ***.  The first symptom began *** years ago.   The most recent neuroimaging was a CT brain in April,  2021 and that was unremarkable.  PREVIOUS MEDICATIONS: ***  ALLERGIES:   Allergies  Allergen Reactions   Ibuprofen Other (See Comments)    "It intensifies my pain." Made pain and symptoms worse. "It intensifies my pain."   Latex     Other Reaction(s): Unknown   Morphine Itching    Red blotches on skin and burning.   Morphine And Codeine Itching and Other (See Comments)    Reaction burns   Penicillins Hives and Itching    Has patient had a PCN reaction causing immediate rash, facial/tongue/throat swelling, SOB or lightheadedness with hypotension: Yes  Has patient had a PCN reaction causing severe rash involving mucus membranes or skin necrosis: No  Has patient had a PCN reaction that required hospitalization No  Has patient had a PCN reaction occurring within the last 10 years: No  If all of the above answers are "NO", then may proceed with Cephalosporin use.  Other Reaction(s): Unknown   Risperdal [Risperidone] Other (See Comments)    Confusion, oversedation    Pseudoephedrine Other (See Comments) and Palpitations    Speed up heart rate Speed up heart rate   Tape Itching and Rash    Itching and burning at site   Vitamin [Cyanocobalamin] Hives and Rash    CURRENT MEDICATIONS:  Outpatient Encounter Medications as of 02/01/2023  Medication Sig   albuterol (VENTOLIN HFA) 108 (90 Base) MCG/ACT inhaler Inhale 2 puffs into the lungs every 6 (  six) hours as needed for wheezing or shortness of breath.   alum & mag hydroxide-simeth (MAALOX MAX) 400-400-40 MG/5ML suspension Take 10 mLs by mouth every 6 (six) hours as needed for up to 36 doses for indigestion.   amitriptyline (ELAVIL) 25 MG tablet Take 25 mg by mouth at bedtime as needed for sleep.   atorvastatin (LIPITOR) 20 MG tablet TAKE 1 TABLET BY MOUTH EVERY MONDAY, WEDNESDAY, AND FRIDAY (Patient not taking: Reported on 12/03/2022)   azelastine (ASTELIN) 0.1 % nasal spray PLACE 2 SPRAYS INTO BOTH NOSTRILS 2 (TWO) TIMES DAILY. USE  IN EACH NOSTRIL AS DIRECTED (Patient taking differently: Place 2 sprays into both nostrils 2 (two) times daily as needed for rhinitis or allergies.)   B Complex Vitamins (VITAMIN B COMPLEX PO) Take 1 tablet by mouth daily.    Calcium Carb-Cholecalciferol (CALCIUM 1000 + D) 1000-800 MG-UNIT TABS Take 1 tablet by mouth daily.    celecoxib (CELEBREX) 100 MG capsule Take 1 capsule (100 mg total) by mouth 2 (two) times daily as needed. (Patient not taking: Reported on 12/03/2022)   Cholecalciferol (VITAMIN D3) 50 MCG (2000 UT) TABS Take 2,000 Units by mouth daily.    DULoxetine (CYMBALTA) 30 MG capsule TAKE 1 CAPSULE BY MOUTH TWICE DAILY (Patient taking differently: Take 30 mg by mouth 2 (two) times daily.)   famotidine (PEPCID) 20 MG tablet Take 1 tablet (20 mg total) by mouth 2 (two) times daily.   fluticasone (FLONASE) 50 MCG/ACT nasal spray Place 2 sprays into both nostrils daily. (Patient not taking: Reported on 12/03/2022)   isosorbide mononitrate (IMDUR) 30 MG 24 hr tablet Take 30 mg by mouth daily.   meloxicam (MOBIC) 7.5 MG tablet Take 7.5 mg by mouth daily as needed for pain.   metoprolol succinate (TOPROL-XL) 50 MG 24 hr tablet Take 50 mg by mouth daily.   mometasone (NASONEX) 50 MCG/ACT nasal spray SHAKE LIQUID AND USE 2 SPRAYS IN EACH NOSTRIL DAILY (Patient not taking: Reported on 12/03/2022)   Multiple Vitamin (MULTIVITAMIN ADULT PO) Take 1 tablet by mouth daily.    Multiple Vitamins-Minerals (ZINC PO) Take 50 mg by mouth daily.    omeprazole (PRILOSEC) 40 MG capsule TAKE 1 CAPSULE BY MOUTH EVERY DAY (Patient taking differently: Take 40 mg by mouth 2 (two) times daily.)   pantoprazole (PROTONIX) 20 MG tablet Take 1 tablet (20 mg total) by mouth 2 (two) times daily for 14 days.   sucralfate (CARAFATE) 1 g tablet Take 1 g by mouth 2 (two) times daily.   tetrabenazine (XENAZINE) 12.5 MG tablet Increase to 25 mg in the morning, take 12.5 mg in the evening (Patient not taking: Reported on  12/03/2022)   Wheat Dextrin (BENEFIBER PO) Take 1 tablet by mouth daily as needed (constipation).   zolpidem (AMBIEN) 10 MG tablet Take 10 mg by mouth at bedtime.   No facility-administered encounter medications on file as of 02/01/2023.    Objective:   PHYSICAL EXAMINATION:    VITALS:  There were no vitals filed for this visit.  GEN:  Normal appears female in no acute distress.  Appears stated age. HEENT:  Normocephalic, atraumatic. The mucous membranes are moist. The superficial temporal arteries are without ropiness or tenderness. Cardiovascular: Regular rate and rhythm. Lungs: Clear to auscultation bilaterally. Neck/Heme: There are no carotid bruits noted bilaterally.  NEUROLOGICAL: Orientation:  The patient is alert and oriented x 3.   Cranial nerves: There is good facial symmetry.  Extraocular muscles are intact and visual fields are  full to confrontational testing. Speech is fluent and clear. Soft palate rises symmetrically and there is no tongue deviation. Hearing is intact to conversational tone. Tone: Tone is good throughout. Sensation: Sensation is intact to light touch and pinprick throughout (facial, trunk, extremities). Vibration is intact at the bilateral big toe. There is no extinction with double simultaneous stimulation. There is no sensory dermatomal level identified. Coordination:  The patient has no difficulty with RAM's or FNF bilaterally. Motor: Strength is 5/5 in the bilateral upper and lower extremities.  Shoulder shrug is equal and symmetric. There is no pronator drift.  There are no fasciculations noted. DTR's: Deep tendon reflexes are 2/4 at the bilateral biceps, triceps, brachioradialis, patella and achilles.  Plantar responses are downgoing bilaterally. Gait and Station: The patient is able to ambulate without difficulty. The patient is able to heel toe walk without any difficulty. The patient is able to ambulate in a tandem fashion. The patient is able to stand  in the Romberg position.    Total time spent on today's visit was ***greater than 60 minutes, including both face-to-face time and nonface-to-face time.  Time included that spent on review of records (prior notes available to me/labs/imaging if pertinent), discussing treatment and goals, answering patient's questions and coordinating care.   Cc:  Mihalovich, Julien Nordmann, PA-C

## 2023-02-01 ENCOUNTER — Ambulatory Visit: Payer: Medicare Other | Admitting: Neurology

## 2023-07-13 ENCOUNTER — Ambulatory Visit: Payer: Medicare Other | Admitting: Orthopedic Surgery

## 2023-07-28 ENCOUNTER — Ambulatory Visit: Admitting: Orthopedic Surgery

## 2024-03-12 ENCOUNTER — Encounter: Payer: Self-pay | Admitting: Radiology
